# Patient Record
Sex: Male | Born: 1960 | Race: White | Hispanic: No | Marital: Married | Smoking: Never smoker
Health system: Southern US, Community
[De-identification: ages and names within clinical notes are randomized; demographics above are authoritative.]

## PROBLEM LIST (undated history)

## (undated) DIAGNOSIS — F259 Schizoaffective disorder, unspecified: Secondary | ICD-10-CM

## (undated) DIAGNOSIS — I10 Essential (primary) hypertension: Secondary | ICD-10-CM

## (undated) DIAGNOSIS — R5383 Other fatigue: Secondary | ICD-10-CM

## (undated) DIAGNOSIS — E119 Type 2 diabetes mellitus without complications: Secondary | ICD-10-CM

## (undated) DIAGNOSIS — K921 Melena: Secondary | ICD-10-CM

## (undated) DIAGNOSIS — M1990 Unspecified osteoarthritis, unspecified site: Secondary | ICD-10-CM

## (undated) DIAGNOSIS — Z87442 Personal history of urinary calculi: Secondary | ICD-10-CM

## (undated) DIAGNOSIS — Z9289 Personal history of other medical treatment: Secondary | ICD-10-CM

## (undated) DIAGNOSIS — R0600 Dyspnea, unspecified: Secondary | ICD-10-CM

## (undated) DIAGNOSIS — E785 Hyperlipidemia, unspecified: Secondary | ICD-10-CM

## (undated) DIAGNOSIS — N200 Calculus of kidney: Secondary | ICD-10-CM

## (undated) DIAGNOSIS — E559 Vitamin D deficiency, unspecified: Secondary | ICD-10-CM

## (undated) DIAGNOSIS — Z720 Tobacco use: Secondary | ICD-10-CM

## (undated) DIAGNOSIS — A048 Other specified bacterial intestinal infections: Secondary | ICD-10-CM

## (undated) HISTORY — DX: Melena: K92.1

## (undated) HISTORY — DX: Vitamin D deficiency, unspecified: E55.9

## (undated) HISTORY — DX: Tobacco use: Z72.0

## (undated) HISTORY — DX: Hyperlipidemia, unspecified: E78.5

## (undated) HISTORY — PX: APPENDECTOMY: SHX54

## (undated) HISTORY — DX: Personal history of other medical treatment: Z92.89

## (undated) HISTORY — DX: Other fatigue: R53.83

## (undated) HISTORY — DX: Essential (primary) hypertension: I10

## (undated) HISTORY — DX: Other specified bacterial intestinal infections: A04.8

## (undated) HISTORY — DX: Schizoaffective disorder, unspecified: F25.9

## (undated) HISTORY — PX: HERNIA REPAIR: SHX51

## (undated) HISTORY — PX: KNEE SURGERY: SHX244

## (undated) HISTORY — DX: Type 2 diabetes mellitus without complications: E11.9

---

## 1998-03-24 ENCOUNTER — Emergency Department (HOSPITAL_COMMUNITY): Admission: EM | Admit: 1998-03-24 | Discharge: 1998-03-24 | Payer: Self-pay | Admitting: Emergency Medicine

## 1998-04-24 ENCOUNTER — Emergency Department (HOSPITAL_COMMUNITY): Admission: EM | Admit: 1998-04-24 | Discharge: 1998-04-24 | Payer: Self-pay | Admitting: Emergency Medicine

## 1999-06-12 ENCOUNTER — Encounter: Payer: Self-pay | Admitting: Emergency Medicine

## 1999-06-12 ENCOUNTER — Emergency Department (HOSPITAL_COMMUNITY): Admission: EM | Admit: 1999-06-12 | Discharge: 1999-06-12 | Payer: Self-pay | Admitting: Emergency Medicine

## 1999-06-12 ENCOUNTER — Inpatient Hospital Stay (HOSPITAL_COMMUNITY): Admission: AD | Admit: 1999-06-12 | Discharge: 1999-06-15 | Payer: Self-pay | Admitting: Psychiatry

## 2000-11-25 ENCOUNTER — Inpatient Hospital Stay (HOSPITAL_COMMUNITY): Admission: RE | Admit: 2000-11-25 | Discharge: 2000-12-01 | Payer: Self-pay | Admitting: *Deleted

## 2001-01-04 ENCOUNTER — Inpatient Hospital Stay (HOSPITAL_COMMUNITY): Admission: EM | Admit: 2001-01-04 | Discharge: 2001-01-09 | Payer: Self-pay | Admitting: *Deleted

## 2002-12-06 ENCOUNTER — Inpatient Hospital Stay (HOSPITAL_COMMUNITY): Admission: EM | Admit: 2002-12-06 | Discharge: 2002-12-10 | Payer: Self-pay | Admitting: Psychiatry

## 2002-12-16 ENCOUNTER — Encounter: Payer: Self-pay | Admitting: Emergency Medicine

## 2002-12-16 ENCOUNTER — Emergency Department (HOSPITAL_COMMUNITY): Admission: EM | Admit: 2002-12-16 | Discharge: 2002-12-16 | Payer: Self-pay | Admitting: Emergency Medicine

## 2003-02-28 ENCOUNTER — Inpatient Hospital Stay (HOSPITAL_COMMUNITY): Admission: AD | Admit: 2003-02-28 | Discharge: 2003-03-04 | Payer: Self-pay | Admitting: Psychiatry

## 2003-03-19 ENCOUNTER — Encounter: Payer: Self-pay | Admitting: Emergency Medicine

## 2003-03-19 ENCOUNTER — Inpatient Hospital Stay (HOSPITAL_COMMUNITY): Admission: EM | Admit: 2003-03-19 | Discharge: 2003-03-22 | Payer: Self-pay

## 2003-03-23 ENCOUNTER — Other Ambulatory Visit (HOSPITAL_COMMUNITY): Admission: RE | Admit: 2003-03-23 | Discharge: 2003-04-07 | Payer: Self-pay | Admitting: Psychiatry

## 2003-03-25 ENCOUNTER — Encounter: Admission: RE | Admit: 2003-03-25 | Discharge: 2003-03-25 | Payer: Self-pay | Admitting: Family Medicine

## 2003-08-26 ENCOUNTER — Emergency Department (HOSPITAL_COMMUNITY): Admission: EM | Admit: 2003-08-26 | Discharge: 2003-08-26 | Payer: Self-pay | Admitting: Emergency Medicine

## 2003-09-08 ENCOUNTER — Emergency Department (HOSPITAL_COMMUNITY): Admission: EM | Admit: 2003-09-08 | Discharge: 2003-09-08 | Payer: Self-pay | Admitting: Emergency Medicine

## 2003-11-24 ENCOUNTER — Emergency Department (HOSPITAL_COMMUNITY): Admission: EM | Admit: 2003-11-24 | Discharge: 2003-11-24 | Payer: Self-pay | Admitting: Emergency Medicine

## 2005-04-23 ENCOUNTER — Emergency Department (HOSPITAL_COMMUNITY): Admission: EM | Admit: 2005-04-23 | Discharge: 2005-04-23 | Payer: Self-pay | Admitting: Emergency Medicine

## 2005-04-27 ENCOUNTER — Emergency Department (HOSPITAL_COMMUNITY): Admission: EM | Admit: 2005-04-27 | Discharge: 2005-04-27 | Payer: Self-pay | Admitting: Family Medicine

## 2005-09-01 ENCOUNTER — Emergency Department (HOSPITAL_COMMUNITY): Admission: EM | Admit: 2005-09-01 | Discharge: 2005-09-01 | Payer: Self-pay | Admitting: Emergency Medicine

## 2006-09-15 DIAGNOSIS — A048 Other specified bacterial intestinal infections: Secondary | ICD-10-CM

## 2006-09-15 HISTORY — DX: Other specified bacterial intestinal infections: A04.8

## 2007-08-30 DIAGNOSIS — K921 Melena: Secondary | ICD-10-CM

## 2007-08-30 HISTORY — DX: Melena: K92.1

## 2007-09-28 ENCOUNTER — Ambulatory Visit: Payer: Self-pay | Admitting: Gastroenterology

## 2007-09-28 DIAGNOSIS — R195 Other fecal abnormalities: Secondary | ICD-10-CM | POA: Insufficient documentation

## 2007-09-28 DIAGNOSIS — Z9889 Other specified postprocedural states: Secondary | ICD-10-CM | POA: Insufficient documentation

## 2007-09-28 DIAGNOSIS — I87309 Chronic venous hypertension (idiopathic) without complications of unspecified lower extremity: Secondary | ICD-10-CM | POA: Insufficient documentation

## 2007-09-28 DIAGNOSIS — K219 Gastro-esophageal reflux disease without esophagitis: Secondary | ICD-10-CM | POA: Insufficient documentation

## 2007-09-28 DIAGNOSIS — R1310 Dysphagia, unspecified: Secondary | ICD-10-CM | POA: Insufficient documentation

## 2007-10-15 ENCOUNTER — Ambulatory Visit: Payer: Self-pay | Admitting: Gastroenterology

## 2008-01-29 ENCOUNTER — Encounter: Payer: Self-pay | Admitting: Gastroenterology

## 2008-01-29 ENCOUNTER — Ambulatory Visit (HOSPITAL_COMMUNITY): Admission: RE | Admit: 2008-01-29 | Discharge: 2008-01-29 | Payer: Self-pay | Admitting: Gastroenterology

## 2008-01-29 ENCOUNTER — Telehealth: Payer: Self-pay | Admitting: Gastroenterology

## 2008-02-03 ENCOUNTER — Ambulatory Visit: Payer: Self-pay | Admitting: Gastroenterology

## 2008-03-04 ENCOUNTER — Telehealth: Payer: Self-pay | Admitting: Gastroenterology

## 2008-03-08 ENCOUNTER — Ambulatory Visit: Payer: Self-pay | Admitting: Gastroenterology

## 2008-04-11 ENCOUNTER — Telehealth: Payer: Self-pay | Admitting: Gastroenterology

## 2008-04-13 ENCOUNTER — Ambulatory Visit: Payer: Self-pay | Admitting: Gastroenterology

## 2008-04-13 LAB — CONVERTED CEMR LAB
Basophils Absolute: 0 10*3/uL (ref 0.0–0.1)
Basophils Relative: 0.1 % (ref 0.0–3.0)
Eosinophils Absolute: 0.5 10*3/uL (ref 0.0–0.7)
Eosinophils Relative: 3.9 % (ref 0.0–5.0)
HCT: 47.8 % (ref 39.0–52.0)
Hemoglobin: 16.5 g/dL (ref 13.0–17.0)
Lymphocytes Relative: 27.5 % (ref 12.0–46.0)
MCHC: 34.6 g/dL (ref 30.0–36.0)
MCV: 86.2 fL (ref 78.0–100.0)
Monocytes Absolute: 0.8 10*3/uL (ref 0.1–1.0)
Monocytes Relative: 6.7 % (ref 3.0–12.0)
Neutro Abs: 7.5 10*3/uL (ref 1.4–7.7)
Neutrophils Relative %: 61.8 % (ref 43.0–77.0)
Platelets: 234 10*3/uL (ref 150–400)
RBC: 5.54 M/uL (ref 4.22–5.81)
RDW: 12 % (ref 11.5–14.6)
WBC: 12.2 10*3/uL — ABNORMAL HIGH (ref 4.5–10.5)

## 2009-03-28 ENCOUNTER — Encounter: Admission: RE | Admit: 2009-03-28 | Discharge: 2009-03-28 | Payer: Self-pay | Admitting: Family Medicine

## 2009-04-03 DIAGNOSIS — Z9289 Personal history of other medical treatment: Secondary | ICD-10-CM

## 2009-04-03 HISTORY — DX: Personal history of other medical treatment: Z92.89

## 2009-04-03 HISTORY — PX: TRANSTHORACIC ECHOCARDIOGRAM: SHX275

## 2009-06-13 ENCOUNTER — Emergency Department (HOSPITAL_COMMUNITY): Admission: EM | Admit: 2009-06-13 | Discharge: 2009-06-13 | Payer: Self-pay | Admitting: Emergency Medicine

## 2009-08-26 DIAGNOSIS — IMO0001 Reserved for inherently not codable concepts without codable children: Secondary | ICD-10-CM

## 2009-08-26 HISTORY — DX: Reserved for inherently not codable concepts without codable children: IMO0001

## 2009-11-29 ENCOUNTER — Encounter: Admission: RE | Admit: 2009-11-29 | Discharge: 2009-11-29 | Payer: Self-pay | Admitting: Internal Medicine

## 2011-01-08 NOTE — Assessment & Plan Note (Signed)
[facility]                         GASTROENTEROLOGY OFFICE NOTE   Travis [name], GILLOOLY                     MRN:          [id number]  DATE:09/28/2007                            DOB:          [birth year]    PROBLEM:  1. Heme-positive stool.  2. Dysphagia.   REASON:  Travis [name] is a pleasant 49 year old white male referred  through  the courtesy of Dr. Perrin Maltese for evaluation.  He has complained of  some left lower quadrant abdominal discomfort.  He claims to have had  intermittent black stools.  He was seen at urgent care where heme-  positive stool was noted.  He takes occasional Goody's Powder or Advil.  He does complain of pyrosis, which is controlled with over-the-counter  Prilosec.  He also has dysphagia to solids.   PAST MEDICAL HISTORY:  Pertinent for hypertension.  He is status post  herniorrhaphy and appendectomy.   FAMILY HISTORY:  Pertinent for both parents with diabetes.   MEDICATIONS:  Include over-the-counter Prilosec and Lotensin.   ALLERGIES:  He has no allergies.   He does not smoke, though he chews tobacco.  He drinks socially.  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   REVIEW OF SYSTEMS:  Pertinent for back pain, sleeping problems with  fatigue.   PHYSICAL EXAMINATION:  Pulse 100.  Blood pressure 130/80.  Weight 192.  HEENT: EOMI.  PERRLA.  Sclerae are anicteric.  Conjunctivae are pink.  NECK:  Supple without thyromegaly, adenopathy or carotid bruits.  CHEST:  Clear to auscultation and percussion without adventitious  sounds.  CARDIAC:  Regular rhythm; normal S1 S2.  There are no murmurs, gallops  or rubs.  ABDOMEN:  There is minimal left lower quadrant tenderness to palpation  without guarding or rebound.  Bowel sounds are normoactive.  Abdomen is  soft, nondistended.  There are no abdominal masses, splenic enlargement  or hepatomegaly.  EXTREMITIES:  Full range of motion.  No cyanosis, clubbing or edema.  RECTAL:   Deferred.   IMPRESSION:  1. Heme-positive stool.  This colonic bleeding source, including      polyps, AVMs,  neoplasm and hemorrhoids, are possibilities.  2. Left lower quadrant pain.  This is quite nonspecific.  3. Gastroesophageal reflux disease.  4. Dysphagia - rule out esophageal stricture.   RECOMMENDATIONS:  1. Colonoscopy.  2. Upper endoscopy with dilatation as indicated.  3. A followup Hemoccult if occult GI bleeding is identified.     Travis Hair. Arlyce Dice, MD,FACG  Electronically Signed    RDK/MedQ  DD: 09/28/2007  DT: 09/28/2007  Job #: [id number]   cc:   Travis [name], M.D.

## 2011-01-08 NOTE — Letter (Signed)
September 28, 2007    [name]  __________   RE:  [name], Travis  MRN:  [id number]  /  DOB:  01/13/1961   Dear [name]:   It is my pleasure to have treated you recently as a new patient in my  office.  I appreciate your confidence and the opportunity to participate  in your care.   Since I do have a busy inpatient endoscopy schedule and office schedule,  my office hours vary weekly.  I am, however, available for emergency  calls every day through my office.  If I cannot promptly meet an urgent  office appointment, another one of our gastroenterologists will be able  to assist you.   My well-trained staff are prepared to help you at all times.  For  emergencies after office hours, a physician from our gastroenterology  section is always available through my 24-hour answering service.   While you are under my care, I encourage discussion of your questions  and concerns, and I will be happy to return your calls as soon as I am  available.   Once again, I welcome you as a new patient and I look forward to a happy  and healthy relationship.    Sincerely,     Barbette Hair. Arlyce Dice, MD,FACG  Electronically Signed   RDK/MedQ  DD: 09/28/2007  DT: 09/28/2007  Job #: [id number]

## 2011-01-08 NOTE — Letter (Signed)
September 28, 2007    Jonita Albee, M.D.  Urgent [facility]  [address]  [place], [place] [address]   RE:  Travis [name], Travis [name]  MRN:  [id number]  /  DOB:  October 29, 1960   Dear Dr. Perrin Maltese:   Upon your kind referral, I had the pleasure of evaluating your patient  and I am pleased to offer my findings.  I saw [name]  in the  office today.  Enclosed is a copy of my progress note that details my  findings and recommendations.   Thank you for the opportunity to participate in your patient's care.    Sincerely,      Barbette Hair. Arlyce Dice, MD,FACG  Electronically Signed    RDK/MedQ  DD: 09/28/2007  DT: 09/28/2007  Job #: [id number]

## 2011-01-11 NOTE — H&P (Signed)
[facility]  Patient:    Travis [name], Travis [name]                     MRN: [id number] Adm. Date:  [id number] Disc. Date: [id number] Attending:  Jasmine Pang                   Psychiatric Admission Assessment  DATE OF ADMISSION:  November 25, 2000  IDENTIFICATION:  A 50 year old, divorced, Caucasian male, from [place], [place].  HISTORY OF PRESENT ILLNESS:  The patient presented to the hospital in a very depressed state with suicidal ideation.  He has a history of mood instability and had been diagnosed by Dr. Dub Mikes as bipolar disorder during his previous admission to our unit one year ago.  He is currently quite depressed and has multiple neurovegetative symptoms including insomnia, anhedonia, anergia, difficulty concentrating, feelings of hopelessness and worthlessness.  He is also experiencing suicidal ideation which was worsening.  He had been on psychiatric medications in the past (Risperdal after last admission here), but states he has been off for the past several months.  He has had some thought blocking and evidence of psychosis.  He states that he sees faces laughing at him much of the time.  He also states he feels that he is "no good."  PAST PSYCHIATRIC HISTORY:  The patient was hospitalized on our unit one year ago.  He was treated by Dr. Dub Mikes.  He was treated with Risperdal and diagnosed as bipolar disorder.  Since that admission, he has also been at [facility].  He as on an antipsychotic but cannot remember the name of it.  He states he discontinued his psychiatric medications after he ran out of a free supply that had been given to him.  He did not return to a doctor for followup and more medication.  Since then, his psychosis and mood have worsened.  PAST MEDICAL HISTORY:  The patient has no medical problems.  ALLERGIES:  No known drug allergies.  MEDICATIONS:  He is on no medication.  FAMILY PSYCHIATRIC HISTORY:  Biologic  father drank alcohol daily.  Mother has a history of suicide attempt.  He states he drinks some alcohol weekly.  The amount is unclear.  One report says four to five times a week but he states it it is not this often.  SOCIAL HISTORY:  The patient currently lives with two friends in [place]. He is divorced x 3 years.  He has three children who live in [place]. He does not have any legal problems.  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  ADMISSION MENTAL STATUS EXAMINATION:  The patient presented as a reserved but cooperative, disheveled, Caucasian male with poor eye contact.  There was psychomotor retardation.  Speech was soft and slow, nonspontaneous.  Mood was depressed, anxious.  Affect constricted.  There was positive psychosis as per history of present illness.  There was positive suicidal ideation.  He contracted for safety while in the hospital.  There was no homicidal ideation. Thought processes were slowed.  There was some mild thought blocking.  Thought content revolved predominately around his anxiety as to whether he would ever be released from the hospital ("are you going to lock me up forever").  On cognitive examination the patient was awake and oriented x 4.  Short- and long-term memory revealed some deficiencies on this examination.  General fund  of knowledge were age and education level appropriate.  Attention and concentration were poor.  Insight fair.  Judgment fair.  ADMISSION DIAGNOSES: Axis I:    Bipolar disorder, depressed phase, severe with psychosis. Axis II:   Deferred. Axis III:  Healthy. Axis IV:   Severe. Axis V:    Current global assessment of functioning 20, highest past year            52.  STRENGTHS AND ASSETS:  The patient has a health-seeking attitude.  He has supportive friends with whom he lives.  PROBLEMS:  Mood instability with hallucinations and delusional ideation as well as  suicidal ideation.  SHORT-TERM TREATMENT GOAL:  Improve reality testing, resolution of suicidal ideation.  LONG-TERM TREATMENT GOAL:  Stabilization of psychotic process to the point that he is able to work and live without significant difficulties.  Resolution of mood instability.  PLAN:  Will begin Geodon 20 mg p.o. b.i.d. x 1 day, then 40 mg p.o. b.i.d. Will also begin Celexa 20 mg q.a.m.  Will discontinued trazodone due to sedation from this (he was also sedate from some Haldol he had received upon admission due to his psychotic processes).  The patient will be involved in unit therapeutic groups and activities.  Our case manager will contact his roommates to discuss disposition issues.  ANTICIPATED LENGTH OF STAY:  Four to five days.  CONDITION NECESSARY FOR DISCHARGE:  Less depressed, not suicidal, not psychotic.  POST-HOSPITAL CARE PLANS:  Return to live with his roommates in [place]. He wants to return to his current job.  FOLLOWUP THERAPY AND MEDICATION MANAGEMENT:  Will be arranged with a Tarris Delbene on his insurance panel. DD:  11/26/00 TD:  11/26/00 Job: [id number]

## 2011-01-11 NOTE — Discharge Summary (Signed)
[facility]  Patient:    Travis [name], Travis [name]                     MRN: [id number] Adm. Date:  [id number] Disc. Date: [id number] Attending:  Jasmine Pang CC:         [facility] IOP program   Discharge Summary  REASON FOR ADMISSION:  The patient was a 50 year old divorced Caucasian male from [place], [place].  He presented to the hospital in a very depressed state with suicidal ideation.  He has a history of mood instability and has been diagnosed by Madie Reno A. Dub Mikes, M.D., as suffering from bipolar disorder during his previous admission to our unit one year ago.  He was not felt to be safe to be managed at this point on outpatient basis.  The patient has discontinued his psychiatric medications after running out of the free supply he had been given.  For further psychiatric admission information, see psychiatric admission assessment.  PHYSICAL EXAMINATION:  The physical examination was done by [facility] D. Warner Mccreedy.  The patient was status post bilateral knee cartilage repair, also status post appendectomy and right inguinal hernia repair, also status post vasectomy.  ADMISSION LABORATORIES:  The urine drug screen was negative.  Urinalysis was within normal limits.  CBC with diff. was grossly within normal limits except for a slightly elevated wbc count of 11.3 (4 to 10.5).  TSH and free T4 were within normal limits.  RPR nonreactive.  HOSPITAL COURSE:  Upon admission, the patient was psychotic.  He was begun on Haldol 5 mg now, then Haldol 5 mg p.o. b.i.d.  He was also begun on Cogentin 1 mg now, then 1 mg p.o. b.i.d.  In addition, he was given a p.[name].n. dose of Haldol 5 mg p.o. q.4h. p.[name].n. audiovisual hallucinations and Cogentin 1 mg p.o. q.4h. p.[name].n. EPS.  He did not require p.[name].n.s.  On November 26, 2000, the patient was begun on Geodon 20 mg p.o. b.i.d.  On Friday, November 28, 2000, Geodon was increased to 40 mg p.o. b.i.d.   He will be discharged on this dose and given a months free supply of Geodon. Afterwards, a prescription was written for an increase to 60 mg p.o. b.i.d.  The patient had also been started on trazodone 50 mg p.o. q.h.s.  This made him too drowsy and the medication was discontinued on November 26, 2000.  Also on November 26, 2000, the patient was begun on Celexa 20 mg p.o. q.a.m. due to depressive symptoms.  The patient tolerated these medications well with no significant side effects. Initially he was sedate but this resolved.  He was able to participate appropriately in unit therapeutic groups and activities.  The roommates he lives with were quite invested in him and wanted him to return to their home (he has lived there for three years).  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 He was concerned about not losing money so he could make his child support payments. His mental status improved markedly during the hospitalization.  DISCHARGE MENTAL STATUS EXAMINATION:  The patient had better eye contact. There was some psychomotor retardation but this had improved.  Speech was still soft and slow but more spontaneous.  Mood was less depressed and anxious.  Affect able to reveal wide range.  He smiled frequently during conversations.  There was no psychosis.  He  was no longer experiencing hallucinations.  There was no suicidal or homicidal ideation.  Thought processes were logical and goal directed.  The mild thought blocking had resolved.  His cognitive function was back to baseline.  Insight good. Judgement good.  DISCHARGE DIAGNOSES: AXIS I.   Schizoaffective disorder, depressed state. AXIS II.  None. AXIS III. Healthy. AXIS IV.  Moderate. AXIS V.   Current Global Assessment of Functioning is 55, highest in past           year 65, admission Global Assessment of Functioning was 20.  DISCHARGE MEDICATIONS: 1. Geodon 40 mg p.o. b.i.d.  x 1 month then 60 mg p.o. b.i.d. 2. Celexa 20 mg q.a.m.  ACTIVITY LEVEL:  No restrictions.  DIET:  No restrictions.  POST HOSPITAL CARE PLANS:  The patient will be followed at the intensive outpatient program by Dr. Ladona Ridgel (since he cross-covered for me during my absence and knows the patient).  He was given a free supply of Geodon and Celexa for one month.  He was then given prescriptions for continued treatment. DD:  12/01/00 TD:  12/01/00 Job: [id number]

## 2011-01-11 NOTE — H&P (Signed)
NAME:  Travis [name], Travis [name]                        ACCOUNT NO.:  [id number]   MEDICAL RECORD NO.:  [id number]                   PATIENT TYPE:  IPS   LOCATION:  0406                                 [facility]ACILITY:  [facility]   PHYSICIAN:  Jeanice Lim, M.D.              DATE OF BIRTH:  14-Feb-1961   DATE OF ADMISSION:  12/06/2002  DATE OF DISCHARGE:                         PSYCHIATRIC ADMISSION ASSESSMENT   DATE OF ASSESSMENT:  December 07, 2002   PATIENT IDENTIFICATION:  This is a 50 year old white separated male who is  voluntary.   HISTORY OF PRESENT ILLNESS:  This patient presented to mental health  complaining of approximately one week of gradually increase in auditory  hallucinations and stating, My thinking is wrong.  He believes that  someone or something is going to harm him.  He is fearful and has begun  seeing some faces of people that he knows sort of hanging in the air in  front of him.  This is a symptom he has had previously.  He is aware that  this is not normal but it, nevertheless, causes him to be fearful and is  upsetting to him.  He has been anhedonic and anxious for the past week.  He  endorses some vague suicidal ideation that is driven out of his fears.  He  has been sleeping poorly and having trouble concentrating or accomplishing  his ADLs.  His hygiene has deteriorated.  He cannot cite any stressor or  think of anything that has changed in his life that may have triggered these  symptoms.  He stopped his medications over a year ago and had not had  symptoms until recently.   PAST PSYCHIATRIC HISTORY:  This is approximately the patient's third  admission to [facility].  He has also been admitted  to [facility] in May 2002 and April 2002.  He has a previous history of suicide  attempt by overdose and has also been previously hospitalized at [facility] and [facility].   SUBSTANCE ABUSE HISTORY:  The patient  denies any previous substance abuse.   PAST MEDICAL HISTORY:  The patient has no regular primary care physician.  Medical problems include occasional stomach ache but he has had none of this  recently.  He also reports some occasional low back ache and neck ache,  which he attributes to lifting boxes at work.  Past medical history is  remarkable for a hernia repair and a previous appendectomy.   MEDICATIONS:  None within the past year.  Previously, the patient was  stabilized on Geodon 20 mg in the morning, 20 mg in the afternoon, and 60 mg  at h.s., and Celexa 20 mg.   REVIEW OF SYSTEMS:  Review of systems is remarkable for some neck pain,  which the patient rates at 2/10 and some lower back pain with movement which  is worse later  in the day, which he describes more as a backache with  fatigue over his lumbar area beneath the belt of his jeans.  The patient  denies that he is sexually active.  He denies any substance abuse.   PHYSICAL EXAMINATION:  GENERAL:  This is a disheveled, dirty, short stature  Caucasian male who is unshaven but is generally fully alert and cooperative.  He does have body odor.  VITAL SIGNS:  Temperature 98.3, pulse 88, respirations 20, blood pressure  125/90.  He is 5 feet 5 inches tall and we do not have a current weight on  him.  He appears to be approximately 160 or 170 pounds.  SKIN:  Skin is medium tone.  HEENT:  Hair is silvery gray.  Head is normocephalic and atraumatic.  Hygiene: Poor.  Hearing intact to normal voice.  EENT: PERRLA.  Sclerae are  nonicteric.  No rhinorrhea.  Oropharynx is noninjected.  NECK:  Supple, no thyromegaly.  CARDIOVASCULAR:  S1 and S2 heard, no clicks, murmurs, or gallops.  LUNGS:  Clear.  ABDOMEN:  Flat, soft with normal bowel sounds, no masses appreciated, no  evidence of tenderness.  GENITALIA:  Deferred.  MUSCULOSKELETAL:  Posture is upright.  Spine is straight.  Strength is 5/5.  NEUROLOGIC:  Cranial nerves II-XII  are intact.  EOM are intact.  Motor  movements are smooth, no tremor, no pill rolling, no cogwheeling, no signs  of EPS.  Romberg: Without findings.  Deep tendon reflexes are 1+/5 and  sluggish.  No focal findings.   LABORATORY DATA:  @@   SOCIAL HISTORY:  The patient has a twelfth grade education.  He has been  separated for several years from his wife and has two children ages 32 and 48.  He was previously married one time for approximately eight years.  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  He has been unable to go to  work for the past week to his regular job because his symptoms have been  distracting him and causing him to feel fearful.   FAMILY HISTORY:  Family history is remarkable for a mother and a father with  a history of alcohol abuse.   MENTAL STATUS EXAM:  The patient has anxious and blunted affect.  He is  cooperative and directable, no signs of psychomotor slowing.  He, however,  seems to be rather anxious.  Speech is without pressure and generally of  normal pace.  He seems a bit anxious and his speech seems to be a bit  monotone in quality.  He offers very little but is readily responsive to  questions, usually just answering yes or no or as little as possible.  His  mood seems depressed and mildly anxious and he is a bit guarded.  He seems  to be a bit fearful.  Thought process is remarkable for paranoia.  He is  having some vague auditory hallucinations, hearing some voices, which are  mumbling and chanting and rather indistinguishable.  He does have some mild  thought agitation at times and does appear to be slightly internally  distracted.  No evidence of overt suicidal ideation or homicidal ideation  today.  Cognitive:. Intact and oriented x 3.  ADMISSION DIAGNOSES:   AXIS I:  1. Psychosis, not  otherwise specified.  2. Rule out  schizoaffective disorder, depressed.   AXIS II:  Deferred.   AXIS III:  1. Chronic low grade neck pain.  2. Chronic back pain, mild.   AXIS IV:  Deferred.   AXIS V:  Current 22, past year 3.   INITIAL PLAN OF CARE:  Plan is to voluntarily admit the patient to relieve  his suicidal ideation and his auditory and visual hallucinations.  We will  check his UA and UDS and a thyroid panel on him.  We will get a baseline EKG  given the fact that he has been on Geodon for a while, he is middle-aged,  and he does not have a regular primary care Florice Hindle.  So, for screening  purposes, we will do that.  We will start him on Celexa 10 mg p.o. daily and  start him on Geodon 20 mg p.o. b.i.d. with his first dose now and then his  next dose at h.s.   ESTIMATED LENGTH OF STAY:  Five days.     Margaret A. Stephannie Peters                   Jeanice Lim, M.D.    MAS/MEDQ  D:  12/10/2002  T:  12/10/2002  Job:  [id number]

## 2011-01-11 NOTE — H&P (Signed)
[facility]  Patient:    Travis [name], Travis [name]                     MRN: [id number] Adm. Date:  [id number] Attending:  Denny Peon Dictator:   Young Berry Lorin Picket, N.P.                   Psychiatric Admission Assessment  DATE OF ADMISSION:  Jan 04, 2001.  IDENTIFYING INFORMATION:  This is a 50 year old divorced Caucasian male who is a voluntary admission for suicidal ideation with a plan to shoot himself, and generalized depression.  REASON FOR ADMISSION AND SYMPTOMS:  Patient with past diagnosis of bipolar disorder and schizoaffective disorder reports feeling depressed and starting to see faces again about a week ago.  States that he has had thoughts of shooting himself for the past 2-3 days.  He reports he was brought to [facility] by his housemate because he was tired and could not sleep, and patient admits that he was also having feelings of paranoia, fearing that something bad would happen, and he also states "if I eat food I know I will be harming my family."  He is also afraid of things that are happening at work, but he is not able to be specific about his feelings, and also is concerned that someone is going to take his fathers house away from his father, although he is unable to be any more clear than that.  He admits to decreased sleep, sleeping hardly at all for the past few days, and greatly decreased appetite, with no specified weight loss.  Patient keeps repeatedly seeing faces, talking faces, and he knows that this is not normal, but this is very upsetting to him.  Today, he admits to some homicidal ideation but no plan or intent today.  He denies homicidal ideation.  He denies actually having any auditory or visual hallucinations today.  PAST PSYCHIATRIC HISTORY:  Patient was seen by Dr. Ladona Ridgel on an outpatient basis, last after discharge from [facility] at the beginning of April.  After seeing Dr. Ladona Ridgel in outpatient, a  follow up appointment was made with him with some male physician in the community apparently.  Patient does not remember her name and he was not able to keep the appointment.  Patient reports at least one prior admission this year here at [facility], also prior admissions at [facility] and [facility].  Patient does have a history of one previous suicide attempt by overdose.  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  Currently he lives with 2 friends from work named Lobbyist and Mantoloking who he states are supportive.  He has been married once for 8 years.  Currently he is separated.  He has 2 children, ages 70 and 8 years.  FAMILY HISTORY:  Positive for a mother and father with ETOH abuse problems in the past.  ALCOHOL AND DRUG HISTORY:  Patient reports occasional use of alcohol, does use oral tobacco, chewing tobacco, on a regular basis, denies any use of illegal drugs.  PAST MEDICAL HISTORY:  Primary care Kennetta Pavlovic is none.  Patient has no regular medical doctor.  Medical problems are history of appendectomy, hernia repair, bad cartilage in both knees, distant history of sexually transmitted diseases; however, patient denies having any risk at this time, and has some chronic mild  lumbar backache he attributes to his work.  Medications:  Apparently patient was previously on Geodon 60 mg p.o. b.i.d. and Celexa 20 mg daily as best we can tell.  He had stopped taking his medications at some point, seems unclear about this, whether he was actually taking them or not.  DRUG ALLERGIES:  No known drug allergies.  POSITIVE PHYSICAL FINDINGS:  His PE is pending.  His labs are pending.  Temp on admission 97.3, vital signs pulse 84, respirations 18.  Patients weight is 171 pounds.  His blood pressure is 136/85.  He has no somatic  complaints today.  MENTAL STATUS EXAMINATION:  This is a casually dressed white male, quite disheveled looking.  Hygiene is poor.  He has a very flat affect; however he is polite and cooperative.  Speech is slow in pace and soft in tone.  Mood is very depressed and guarded.  Patient has obvious psychomotor retardation. Thought process is moderately disorganized, with obvious thought blocking. Positive suicidal ideation but no plan or intent, and positive for paranoid ideation.  He is negative for homicidal ideation, and no evidence of auditory or visual hallucinations today.  Cognitively, he is oriented to person, place and day but not the date.  ADMISSION DIAGNOSES: Axis I:    Rule out bipolar disorder, depressed vs. schizoaffective disorder            and major depression. Axis II:   Deferred. Axis III:  None. Axis IV:   Moderate problems with his primary support group related to his            marital status and his separation from  his wife. Axis V:    Current 35, past year 22.  INITIAL PLAN OF CARE:  Plan is to admit the patient to stabilize his mood. Q.15 minute checks in place for safety.  We will force Gatorade at this point for electrolytes since patients appetite has decreased.  We will order his previous old record to the floor, and for his depression we will start him on Celexa 20 mg p.o. daily, and we will start his Celexa today.  For his paranoia and psychotic symptoms, we will start him on Zyprexa 2.5 mg t.i.d. with the first dose now, and Geodon 20 mg p.o. b.i.d., and we will order routine labs on him.  TENTATIVE LENGTH OF STAY:  Three to five days. DD:  01/05/01 TD:  01/05/01 Job: [id number]

## 2011-01-11 NOTE — Discharge Summary (Signed)
NAME:  Travis [name], Travis [name]                        ACCOUNT NO.:  [id number]   MEDICAL RECORD NO.:  [id number]                   PATIENT TYPE:  IPS   LOCATION:  0406                                 [facility]ACILITY:  [facility]   PHYSICIAN:  Jeanice Lim, M.D.              DATE OF BIRTH:  [birth year]   DATE OF ADMISSION:  12/06/2002  DATE OF DISCHARGE:  12/10/2002                                 DISCHARGE SUMMARY   IDENTIFYING DATA:  This is a 50 year old Caucasian separated male  voluntarily admitted, complaining of approximately one week of increasing  auditory hallucinations, stating, my thinking is wrong, fearing that  someone may hurt him, fearful, seeing faces of people he knew, reporting  vague suicidal thoughts and difficulty sleeping and difficulty with ADLs.   MEDICATIONS:  1. Geodon 20 mg t.i.d. and 60 mg q.h.s.  2. Seroquel 20 mg q.a.m.   PHYSICAL EXAMINATION:  Physical exam essentially within normal limits,  neurologically nonfocal.   ROUTINE ADMISSION LABORATORY DATA:  EKG within normal limits.   CBC within normal limits.  CMET within normal limits.  TSH 2.9.   MENTAL STATUS EXAMINATION:  Anxious to the point of death, hypercooperative,  with no signs of psychomotor slowing, appearing anxious.  Speech was without  pressure but monotone, short answers, appearing depressed, anxious and  fearful with vague auditory hallucinations, mumbling and paranoid ideation.  No acute suicidal or homicidal ideation, cognitively intact, judgment and  insight fair.   ADMITTING DIAGNOSES:   AXIS I:  Schizoaffective disorder, depressed type.   AXIS II:  None.   AXIS III:  Chronic low-grade neck pain and back pain.   AXIS IV:  Moderate, limited support system.   AXIS V:  22/60.   HOSPITAL COURSE:  The patient was admitted, ordered routine p.[name].n.  medications, underwent further monitor and was cooperative and participated  in individual, group and milieu therapy.  The patient was optimized  on  Geodon and Celexa, participated in aftercare planning and reported positive  response to medications without side-effects.   CONDITION AT DISCHARGE:  Improved.  Mood was more euthymic, less depressed,  affect bright and thought process goal-directed, thought content negative  for dangerous ideation or psychotic symptoms.  The patient denied having  voices or hallucinations and had no suicidal or homicidal ideation.   DISCHARGE MEDICATIONS:  The patient was discharged on:  1. Geodon 60 mg b.i.d. to take with food.  2. Klonopin 0.25 mg q.a.m. and q.h.s.  3. Celexa 20 mg q.a.m.  4. Ambien 10 mg q.h.s. p.[name].n.   FOLLOWUP:  The patient will follow up with Dr. Jeanice Lim within one  week.   DISCHARGE DIAGNOSES:   AXIS I:  Schizoaffective disorder, depressed type.   AXIS II:  None.   AXIS III:  Chronic low-grade neck pain and back pain.   AXIS IV:  Moderate, limited support system.   AXIS V:  Global  assessment of functioning was 50 to 55 on discharge.                                               Jeanice Lim, M.D.    JEM/MEDQ  D:  12/22/2002  T:  12/23/2002  Job:  [id number]

## 2011-01-11 NOTE — H&P (Signed)
NAME:  Travis [name], Travis [name]                        ACCOUNT NO.:  [id number]   MEDICAL RECORD NO.:  [id number]                   PATIENT TYPE:  IPS   LOCATION:  0507                                 [facility]ILITY[facility]  [facility]   PHYSICIAN:  Geoffery Lyons, M.D.                   DATE OF BIRTH:  [birth year]   DATE OF ADMISSION:  02/28/2003  DATE OF DISCHARGE:                         PSYCHIATRIC ADMISSION ASSESSMENT   IDENTIFYING INFORMATION:  This is a 50 year old white male who is separated.  This is a voluntary admission.   HISTORY OF PRESENT ILLNESS:  This patient with a history of schizoaffective  disorder presented here at the hospital as a walk-in appearing scared and  tearful and stating that he had voices in his head that they don't like  me.  He reports that the voices appeared 2 or 3 days ago and he had been  becoming progressively more afraid, afraid that people do not like him and  feeling generally paranoid, says that the voices are distracting him and  giving him bad ideas, but he denies that he has any desire to overtly hurt  anyone else or himself, but he feels that the bad ideas are that people are  against him.  He has been unable to function at work, lying in bed a lot for  the past 2 or 3 days.  He endorses feelings of depressed mood, anhedonia,  vague paranoia, and having auditory hallucinations, also poor appetite and  auditory hallucinations which are disrupting his sleep.  Apparently there  are no clear commands with the auditory hallucinations, but they are quite  disruptive to him and he is hearing a lot of background noise.  The patient  denies any overt suicidal ideation or homicidal ideation, denies any visual  hallucinations.   PAST PSYCHIATRIC HISTORY:  The patient has no current psychiatrist.  He did  not follow up after his last discharge from [facility] which was in April of 2004.  This is his 4th hospitalization at [facility].  The patient does have a  history of suicide attempt in the distant past.  His  hospitalizations have occurred usually after episodes of medication  noncompliance.   SOCIAL HISTORY:  The patient has a 12th grade education.  He is separated  many years from his previous wife and has 2 children who do not live with  him.  He lives with 3 friends, for the past 4 years who help him, and he  reports they are supportive and get along well.  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  No legal charges.   FAMILY HISTORY:  Remarkable for a mother with a history of substance abuse.   ALCOHOL AND DRUG HISTORY:  The patient denies any alcohol or substance  abuse.  He does use oral tobacco and the amount is unclear.   PAST MEDICAL  HISTORY:  The patient reports that he is followed by a Dr.  Memory Argue whose name is on his card, but apparently he has never seen this  physician and does not seem to know who he is.  Medical problems are chronic  low back pain, which he scores a 2/10.  He denies any other physical  problems.  Past medical history is remarkable for hernia repair,  appendectomy, and a history of intermittently elevated blood pressure.  He  also has a history of being treated for sexually-transmitted diseases in the  past, specifically gonorrhea.   REVIEW OF SYSTEMS:  Remarkable for his denial of any seizures, blackouts or  memory loss.  He has a history of bilateral knee surgery but complains of no  pain of this recently.  He does also have a history of a vasectomy and a  past history of kidney stones.  The patient is unclear on why he stopped  taking his medicines.  He denies any specific side effects from them and  when asked why he stopped them he simply shrugs his shoulders.   MEDICATIONS:  None at this time.  The patient was previously stabilized on  Geodon 60 mg p.o. b.i.d., Klonopin 0.25 mg every morning and q.h.s., Celexa  20 mg daily, and Ambien 10 mg as needed for sleep.    DRUG ALLERGIES:  None.   POSITIVE PHYSICAL FINDINGS:  This is a well-nourished, well-developed,  generally healthy appearing but disheveled male.  He is approximately 5 feet  5-1/2 inches tall, weighs 180 pounds.  Temperature 98, pulse 81,  respirations 20, blood pressure 119/77.  HEAD:  Normocephalic and atraumatic.  EENT:  PERRLA.  Sclerae are  nonicteric, no rhinorrhea.  Oropharynx is marked by some foul breath odor  due to poor hygiene.  Otherwise Oropharynx is unremarkable.  Teeth appear to  have received repair.  NECK:  Supple, no thyromegaly or lymphadenopathy.  CARDIOVASCULAR:  S1 and S2 is heard, no clicks, murmurs or gallops or extra  sounds.  Apical pulse is synchronous with radial pulse.  LUNGS:  Clear to auscultation.  ABDOMEN:  Flat, soft, nontender.  No CVA tenderness is noted.  GENITALIA:  Deferred.  MUSCULOSKELETAL:  Gait normal, no swelling or erythema of any joints.  NEURO:  Cranial nerves II-XII intact.  EOMs intact.  No nystagmus.  Gait is  normal with normal arm swing.  The patient does display some very slight  psychomotor slowing.  Motor movements however are smooth.  Sensory is  grossly intact.  Deep tendon reflexes are 2+/5 and are brisk.  Romberg  without findings.  EXTREMITIES:  Pink, warm, no swelling.  SKIN:  Without remarkable lesions or tattoos.   LABORATORY DATA:  Currently pending.   MENTAL STATUS EXAM:  This is a disheveled, medium build male with normal  motor exam other than very slight psychomotor slowing.  He is disheveled,  with body odor.  His AMES score is noted at 0.  His BURNS was 14 at the time  of admission.  Speech is soft, without pressure.  He gives minimal answers.  Mood is generally guarded and mildly anxious.  Thought process is remarkable  for no overt suicidal or homicidal ideation.  He is still having some mild  auditory hallucinations and appears mildly anxious and a little bit paranoid, but is generally receptive and  cooperative with the exam.  He  appears to have some very mild though disorganization and internal  distraction along with the hallucinations.  Cognitively he is intact and  oriented x3, however he is directable and is still able to track  conversations in spite of his symptoms.  His thought content today is  primarily concerned about resolving his symptoms and feels quite afraid of  his symptoms.  He is also concerned about giving adequate notification to  his work so that he does not lose his job.   ADMISSION DIAGNOSIS:   AXIS I:  Schizoaffective disorder, depressed.   AXIS II:  No diagnosis.   AXIS III:  Chronic low back pain.   AXIS IV:  Moderate problems with chronic medication noncompliance for  reasons which are not clear.   AXIS V:  Current 23, past year 79.   INITIAL PLAN OF CARE:  Voluntarily admit the patient with q.15 minute checks  in place.  Our goal is to control his auditory hallucinations so that the  patient can function more safely back in his own environment and at work.  We are planning on restarting his Geodon 60 mg p.o. b.i.d. with food,  Klonopin 0.25 mg p.o. b.i.d., and Celexa 20 mg p.o. daily.  We will do basic  labs on him including a CBC and metabolic panel and a thyroid panel.  We  will also do a urine for GC and chlamydia and a urine drug screen.  We will  do an RPR on him also and we have provided Tylenol for him if his back pain  bothers him and he have instructed him in this, and we will also provide him  with a nicotine patch, 14 mg daily, since he usually uses oral tobacco.  However, he states he does not think he is going to need it, but we have  instructed him that we have made this available to him if he needs it while  he is here.  We have explained about the medications to him and the  importance of staying on them regularly.  He has asked some pertinent  questions.  He has explained the risks and benefits and he is in agreement.  He has  been interacting satisfactorily in the hallways, although he does  state he skipped breakfast this morning.  He reported that he did get a dose  of medicine last night and so far the voices are decreased this morning over  what they were yesterday and he denies any overt side effects.   ESTIMATED LENGTH OF STAY:  5 days.      Margaret A. Scott, N.P.                   Geoffery Lyons, M.D.    MAS/MEDQ  D:  03/01/2003  T:  03/01/2003  Job:  [id number]

## 2011-01-11 NOTE — Discharge Summary (Signed)
[facility]  Patient:    Travis [name], Travis [name]                     MRN: [id number] Adm. Date:  [id number] Disc. Date: [id number] Attending:  Denny Peon Dictator:   Candi Leash. Orsini, N.P.                           Discharge Summary  IDENTIFYING INFORMATION:  Patient is a 50 year old divorced Caucasian male, who was a voluntary admission for suicidal ideation with a plan to shoot himself and also having some generalized depressive symptoms.  Patient has a past diagnosis of bipolar disorder and schizoaffective disorder, feeling rather depressed and starting to see hallucinations.  He was having thoughts of shooting himself for the past 2-3 days prior to admission.  She was also having some paranoid feelings.  Patient was reporting decreased sleep, decreased appetite but no specific weight loss indicated.  Patient reports seeing talking faces and knowing that this was not normal behavior.  Patient also admitted to having some homicidal ideation but no plan or intent on day of admission.  PAST PSYCHIATRIC HISTORY:  Patient sees Dr. Ladona Ridgel on an outpatient basis.  PAST MEDICAL HISTORY:  Primary care Yosef Krogh none.  Medical problems include history of appendectomy, hernia repair and chronic mild lumbar pain.  CURRENT MEDICATIONS:  Geodon 60 mg p.o. b.i.d., Celexa 20 mg q.d.  DRUG ALLERGIES:  No known drug allergies.  PHYSICAL EXAMINATION:  Patients vital signs were normal and having no somatic complaints upon interview.  MENTAL STATUS EXAMINATION:  He is a casually dressed white male, quite disheveled looking.  Hygiene poor.  Flat affect.  He is polite and cooperative.  Speech is slow and pace and soft is tone.  Mood is depressed and guarded.  Patient has obvious psychomotor retardation.  Thought processes are moderately disorganized with obvious thought-blocking.  Positive suicidal ideation with no specific plan or intent and positive for paranoid  ideation. Negative for homicidal ideation.  No evidence of auditory or visual hallucinations during interview.  Cognitively, he is oriented to person, place and day but not the date.  ADMITTING DIAGNOSES: Axis I:    Rule out bipolar disorder, depressed, versus schizoaffective            disorder and major depression. Axis II:   Deferred. Axis III:  None. Axis IV:   Moderate problems (related to primary support group with marital            stress and separation from his wife). Axis V:    Current 35; past year 34.  HOSPITAL COURSE:  Patient was admitted to stabilize his mood.  Will monitor patient every 15 minutes and encourage Gatorade for electrolytes since patients appetite has decreased.  Will obtain old records and start him on an antidepressant.  We will also start him on Zyprexa for psychotic symptoms and resume his Geodon b.i.d.  Patient initially was feeling less anxious.  He was having occasional hallucinations but presented with no suicidal or homicidal ideation.  His Geodon was increased.  Patient continued having some paranoid ideation, although he was sleeping better.  We increased his Geodon again.  CONDITION ON DISCHARGE:  Persecutory delusions had completely cleared.  There are no more hallucinations.  Patient was sleeping well.  His affect was bright.  He was having no dangerous ideas.  He was tolerating his medications well without any side effects.  We felt patient was ready to be discharged on outpatient basis.  Patient will be discharged to outpatient psychiatry services.  Social worker will follow up with an appointment for the patient. There were no other restrictions.  Patient was instructed to call for any problems with his medications or dangerous thoughts.  DISCHARGE MEDICATIONS: 1. Celexa 20 mg 1 q.a.m. 2. Geodon 20 mg 1 q.a.m., 1 at 4 p.m. and 3 q.h.s.  DISCHARGE DIAGNOSES: Axis I:    Rule out bipolar disorder, depressed, versus schizoaffective             disorder and major depression. Axis II:   Deferred. Axis III:  None. Axis IV:   Moderate psychosocial stressors. Axis V:    Current 55; this past year 74. DD:  01/09/01 TD:  01/10/01 Job: [id number]

## 2011-01-11 NOTE — Discharge Summary (Signed)
NAME:  Travis [name], Travis [name]                        ACCOUNT NO.:  [id number]   MEDICAL RECORD NO.:  [id number]                   PATIENT TYPE:  IPS   LOCATION:  0507                                 FACILITY:  [facility]   PHYSICIAN:  Geoffery Lyons, M.D.                   DATE OF BIRTH:  07-Jun-1961   DATE OF ADMISSION:  02/28/2003  DATE OF DISCHARGE:  03/04/2003                                 DISCHARGE SUMMARY   CHIEF COMPLAINT AND PRESENT ILLNESS:  This was one of several admissions to  [facility] for this 50 year old male who is separated and  voluntarily admitted.  History of schizoaffective disorder, walking,  claiming to be scared, tearful, hearing voices in his head, claimed they  don't like me, that the voices appeared two or three days prior to this  admission.  Becoming progressively more afraid that people do not like him.  Generally paranoid.  Claimed that he was having bad ideas but denies any  active ideas to hurt himself.  Endorsing no depressed mood, no anhedonia,  vague paranoia, lying in bed for last two or three days before this  admission.   PAST PSYCHIATRIC HISTORY:  Has been at [facility] at least  four times.  Did not follow up when he was discharged last time.   ALCOHOL/DRUG HISTORY:  Denies the use or abuse of any substances.   PAST MEDICAL HISTORY:  Chronic low back pain, hernia repair, appendectomy,  high blood pressure.   PHYSICAL EXAMINATION:  Performed and failed to show any acute findings.   MENTAL STATUS EXAM:  Disheveled, medium-built male with normal motor exam  with very slight psychomotor slowing.  Body odor.  Speech is soft without  pressure.  Gives minimal answers.  Mood is guarded, mildly anxious.  Thought  processes remarkable for no suicidal or homicidal ideation, some auditory  hallucinations, appears anxious and somewhat suspicious.  Cognition well-  preserved.   ADMISSION DIAGNOSES:   AXIS I:  Schizoaffective  disorder.   AXIS II:  No diagnosis.   AXIS III:  Chronic back pain.   AXIS IV:  Moderate.   AXIS V:  Global Assessment of Functioning upon admission 23; highest Global  Assessment of Functioning in the last year 60.   LABORATORY DATA:  CBC with hemoglobin 17.1.  Blood chemistry within normal  limits.  Thyroid within normal limits.  Drug screen negative for substances  of abuse.   HOSPITAL COURSE:  He was admitted and started intensive individual and group  psychotherapy.  He was placed on Geodon 60 mg twice a day, Klonopin 0.25 mg  twice a day, Celexa 20 mg in the morning and Ambien as needed for sleep.  By  second day of hospitalization, back on medications.  Started feeling better.  Understood that it was important for him to stay on medications if he was  going to  have long-term stability.  He slept.  Was experiencing less  anxiety.  No further hallucinations.  On March 04, 2003, he was in full  contact with reality.  No suicidal ideation.  No homicidal ideation.  Increased insight in terms of the need to stay on medication.  Willing and  motivated to pursue outpatient treatment.   DISCHARGE DIAGNOSES:   AXIS I:  Schizoaffective disorder.   AXIS II:  No diagnosis.   AXIS III:  Chronic back pain.   AXIS IV:  Moderate.   AXIS V:  Global Assessment of Functioning upon discharge 50-55.   DISCHARGE MEDICATIONS:  1. Geodon 60 mg twice a day.  2. Celexa 20 mg daily.  3. Klonopin 0.25 mg twice a day.  4. Multivitamin 1 daily.   FOLLOW UP:  Dr. Evelene Croon.                                               Geoffery Lyons, M.D.    IL/MEDQ  D:  04/27/2003  T:  04/28/2003  Job:  [id number]

## 2011-01-11 NOTE — Discharge Summary (Signed)
   NAME:  Travis [name], Travis [name]                        ACCOUNT NO.:  [id number]   MEDICAL RECORD NO.:  [id number]                   PATIENT TYPE:  INP   LOCATION:  4732                                 FACILITY:  [facility]   PHYSICIAN:  Asencion Partridge, M.D.                  DATE OF BIRTH:  08/20/1961   DATE OF ADMISSION:  03/19/2003  DATE OF DISCHARGE:  03/22/2003                                 DISCHARGE SUMMARY   DISCHARGE DIAGNOSES:  1. Multiple drug overdose.  2. Suicide attempt.  3. Depression.   DISCHARGE MEDICATIONS:  1. Celexa 20 mg daily.  2. Clonazepam 0.25 mg b.i.d.  3. Zolpidem 10 mg q.h.s.   DISPOSITION:  The patient will be transferred to [facility] and followup will be decided upon discharge from there.   PROCEDURES PERFORMED:  None.   CONSULTATIONS:  [facility], Dr. Jeanie Sewer.   HISTORY OF PRESENT ILLNESS:  This is a 50 year old white male with a history  of depression who was brought to the emergency department by his roommate  who noted a mildly abnormal gait and the patient admitted to taking Geodon,  Celexa, Ambien, and Clonazepam. The patient had been recently discharged  from [facility], approximately three weeks before  suicide attempt.   HOSPITAL COURSE:  #1- OVERDOSE.  The patient was admitted to telemetry for  observation. All medications were held until the patient was alert and  taking p.o. Once the patient was alert and oriented Celexa, Clonazepam, and  Ambien were restarted.  #2 - SUICIDE ATTEMPT. The patient was given a 24-hour sitter to be in the  room with him and behavioral medicine was consulted for readmission to  behavioral medicine. D      Anastasio Auerbach, MD                          Asencion Partridge, M.D.    AD/MEDQ  D:  03/22/2003  T:  03/22/2003  Job:  [id number]   cc:   Antonietta Breach, M.D.  [address]  [place], [place] [address]  Fax: [phone]   [facility]  Behavioral Medicine

## 2011-02-21 LAB — BASIC METABOLIC PANEL: Glucose: 204 mg/dL

## 2011-08-11 ENCOUNTER — Ambulatory Visit (INDEPENDENT_AMBULATORY_CARE_PROVIDER_SITE_OTHER): Payer: 59

## 2011-08-11 ENCOUNTER — Ambulatory Visit (HOSPITAL_COMMUNITY)
Admission: RE | Admit: 2011-08-11 | Discharge: 2011-08-11 | Disposition: A | Discharge status: Home / Self Care | Payer: Self-pay | Source: Ambulatory Visit | Attending: Family Medicine | Admitting: Family Medicine

## 2011-08-11 DIAGNOSIS — M79609 Pain in unspecified limb: Secondary | ICD-10-CM | POA: Insufficient documentation

## 2011-08-11 DIAGNOSIS — R52 Pain, unspecified: Secondary | ICD-10-CM

## 2011-08-11 DIAGNOSIS — E119 Type 2 diabetes mellitus without complications: Secondary | ICD-10-CM

## 2011-08-11 LAB — HEMOGLOBIN A1C: Hgb A1c MFr Bld: 10.6 % — AB (ref 4.0–6.0)

## 2011-09-07 ENCOUNTER — Encounter: Payer: Self-pay | Admitting: *Deleted

## 2011-09-07 DIAGNOSIS — Z720 Tobacco use: Secondary | ICD-10-CM

## 2011-09-07 DIAGNOSIS — IMO0001 Reserved for inherently not codable concepts without codable children: Secondary | ICD-10-CM

## 2011-09-07 DIAGNOSIS — E785 Hyperlipidemia, unspecified: Secondary | ICD-10-CM

## 2011-09-07 DIAGNOSIS — E559 Vitamin D deficiency, unspecified: Secondary | ICD-10-CM | POA: Insufficient documentation

## 2011-09-07 DIAGNOSIS — E119 Type 2 diabetes mellitus without complications: Secondary | ICD-10-CM | POA: Insufficient documentation

## 2011-10-08 ENCOUNTER — Encounter: Payer: Self-pay | Admitting: Emergency Medicine

## 2011-10-20 ENCOUNTER — Other Ambulatory Visit: Payer: Self-pay | Admitting: Family Medicine

## 2011-10-20 MED ORDER — GLUCOSE BLOOD VI STRP: ORAL_STRIP | Status: AC

## 2011-11-22 ENCOUNTER — Other Ambulatory Visit: Payer: Self-pay | Admitting: Emergency Medicine

## 2011-12-11 ENCOUNTER — Other Ambulatory Visit: Payer: Self-pay

## 2011-12-11 MED ORDER — AMLODIPINE BESYLATE 10 MG PO TABS: 10.0000 mg | ORAL_TABLET | Freq: Every day | ORAL | Status: DC

## 2011-12-27 ENCOUNTER — Other Ambulatory Visit: Payer: Self-pay | Admitting: Physician Assistant

## 2012-01-20 ENCOUNTER — Other Ambulatory Visit: Payer: Self-pay | Admitting: Family Medicine

## 2012-02-17 ENCOUNTER — Other Ambulatory Visit (HOSPITAL_COMMUNITY): Payer: Self-pay | Admitting: Family Medicine

## 2012-02-17 ENCOUNTER — Ambulatory Visit (HOSPITAL_COMMUNITY)
Admission: RE | Admit: 2012-02-17 | Discharge: 2012-02-17 | Disposition: A | Discharge status: Home / Self Care | Payer: 59 | Source: Ambulatory Visit | Attending: Family Medicine | Admitting: Family Medicine

## 2012-02-17 DIAGNOSIS — M25569 Pain in unspecified knee: Secondary | ICD-10-CM

## 2012-02-17 DIAGNOSIS — M1710 Unilateral primary osteoarthritis, unspecified knee: Secondary | ICD-10-CM | POA: Insufficient documentation

## 2012-02-17 DIAGNOSIS — IMO0002 Reserved for concepts with insufficient information to code with codable children: Secondary | ICD-10-CM | POA: Insufficient documentation

## 2012-03-03 ENCOUNTER — Other Ambulatory Visit: Payer: Self-pay | Admitting: Physician Assistant

## 2012-05-19 ENCOUNTER — Other Ambulatory Visit: Payer: Self-pay | Admitting: Physician Assistant

## 2012-05-19 NOTE — Telephone Encounter (Signed)
Chart pulled to PA pool at nurse station [phone]

## 2012-10-05 ENCOUNTER — Other Ambulatory Visit: Payer: Self-pay | Admitting: Family Medicine

## 2012-10-05 NOTE — Telephone Encounter (Signed)
Chart pulled to PA pool at nurses station [phone]

## 2012-10-05 NOTE — Telephone Encounter (Signed)
Please pull paper chart. Medication not in epic.

## 2012-11-24 ENCOUNTER — Other Ambulatory Visit (HOSPITAL_COMMUNITY): Payer: Self-pay | Admitting: Internal Medicine

## 2012-11-24 ENCOUNTER — Ambulatory Visit (HOSPITAL_COMMUNITY)
Admission: RE | Admit: 2012-11-24 | Discharge: 2012-11-24 | Disposition: A | Discharge status: Home / Self Care | Payer: 59 | Source: Ambulatory Visit | Attending: Internal Medicine | Admitting: Internal Medicine

## 2012-11-24 DIAGNOSIS — R509 Fever, unspecified: Secondary | ICD-10-CM | POA: Insufficient documentation

## 2012-11-24 DIAGNOSIS — R05 Cough: Secondary | ICD-10-CM | POA: Insufficient documentation

## 2012-11-24 DIAGNOSIS — R079 Chest pain, unspecified: Secondary | ICD-10-CM | POA: Insufficient documentation

## 2012-11-24 DIAGNOSIS — R059 Cough, unspecified: Secondary | ICD-10-CM | POA: Insufficient documentation

## 2013-03-22 ENCOUNTER — Other Ambulatory Visit (HOSPITAL_COMMUNITY): Payer: Self-pay | Admitting: Internal Medicine

## 2013-03-22 DIAGNOSIS — R109 Unspecified abdominal pain: Secondary | ICD-10-CM

## 2013-03-22 DIAGNOSIS — R10811 Right upper quadrant abdominal tenderness: Secondary | ICD-10-CM

## 2013-03-24 ENCOUNTER — Ambulatory Visit (INDEPENDENT_AMBULATORY_CARE_PROVIDER_SITE_OTHER): Payer: 59 | Admitting: Family Medicine

## 2013-03-24 ENCOUNTER — Ambulatory Visit (HOSPITAL_COMMUNITY): Payer: 59

## 2013-03-24 VITALS — BP 118/68 | HR 96 | Temp 98.0°F | Resp 16 | Ht 64.25 in | Wt 195.4 lb

## 2013-03-24 DIAGNOSIS — R040 Epistaxis: Secondary | ICD-10-CM

## 2013-03-24 LAB — POCT CBC
Granulocyte percent: 71.1 %G (ref 37–80)
HCT, POC: 48.2 % (ref 43.5–53.7)
Hemoglobin: 15.6 g/dL (ref 14.1–18.1)
Lymph, poc: 2.9 (ref 0.6–3.4)
MCH, POC: 29.7 pg (ref 27–31.2)
MCHC: 32.4 g/dL (ref 31.8–35.4)
MCV: 91.6 fL (ref 80–97)
MID (cbc): 1.2 — AB (ref 0–0.9)
MPV: 9.5 fL (ref 0–99.8)
POC Granulocyte: 10 — AB (ref 2–6.9)
POC LYMPH PERCENT: 20.7 %L (ref 10–50)
POC MID %: 8.2 %M (ref 0–12)
Platelet Count, POC: 234 10*3/uL (ref 142–424)
RBC: 5.26 M/uL (ref 4.69–6.13)
RDW, POC: 13.6 %
WBC: 14.1 10*3/uL — AB (ref 4.6–10.2)

## 2013-03-24 LAB — PROTIME-INR
INR: 0.86 (ref <1.50)
Prothrombin Time: 11.8 seconds (ref 11.6–15.2)

## 2013-03-24 NOTE — Progress Notes (Addendum)
[facility] [address] [place] [address] [phone]  Date:  03/24/2013   Name:  Travis [name]   DOB:  11/22/1960   MRN:  [id number]  PCP:  Cassell Smiles., MD    Chief Complaint: Epistaxis   History of Present Illness:  Travis [name] is a 52 y.o. very pleasant male patient who presents with the following:  He is here today with a nosebleed.  The left side has been bleeding off and off since yesterday.  Marland Kitchen   He went to the [facility] doctor yesterday for this nosebleed.  The nose was packed, but whenever he tries to remove the packing he will "gush" blood.  He took the packing out this morning but had bleeding and repacked it.    No bleeding from the right nostril.  No gum bleeding.  No gross hematuria- however he did have microscopic hematuria at his recent CPE which is being evaluated by his PCP in [place], [place].    He has been on asirin, but has not taken for the last 2 days.  Otherwise he is feeling ok today Patient Active Problem List   Diagnosis Date Noted  . Tobacco chew use   . NIDDM (non-insulin dependent diabetes mellitus)   . Dyslipidemia   . Unspecified vitamin D deficiency   . 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 ESOPHAGEAL REFLUX 09/28/2007  . DYSPHAGIA UNSPECIFIED 09/28/2007  . BLOOD IN STOOL, OCCULT 09/28/2007  . HERNIORRHAPHY, HX OF 09/28/2007    Past Medical History  Diagnosis Date  . Tobacco chew use   . Fatigue     NAUSEA,BLOATING  . H. pylori infection 09/15/2006  . Hematochezia 08/30/2007  . NIDDM (non-insulin dependent diabetes mellitus) 2011  . Dyslipidemia   . Unspecified vitamin D deficiency     Past Surgical History  Procedure Laterality Date  . Appendectomy    . Knee surgery      BOTH  . Hernia repair      RIGHT    History  Substance Use Topics  . Smoking status: Unknown If Ever Smoked  . Smokeless tobacco: Current User    Types: Chew  . Alcohol Use: Not on file     Family History  Problem Relation Age of Onset  . Stroke Father 30  . Pneumonia Father   . Heart attack      DECEASED IN THEIR 40'S  . Heart failure Mother 50    MI  . Heart disease Mother   . Cancer      X2 MATERNAL SIDE    Allergies  Allergen Reactions  . Lisinopril Swelling    ACE ANGIOEDEMA    Medication list has been reviewed and updated.  Current Outpatient Prescriptions on File Prior to Visit  Medication Sig Dispense Refill  . amLODipine (NORVASC) 10 MG tablet Take 1 tablet (10 mg total) by mouth daily. NEEDS OFFICE VISIT FOR MORE  30 tablet  0  . aspirin 81 MG tablet Take 81 mg by mouth daily.      . metFORMIN (GLUCOPHAGE) 1000 MG tablet Take 1,000 mg by mouth 2 (two) times daily with a meal.      . pravastatin (PRAVACHOL) 40 MG tablet TAKE 1 TABLET BY MOUTH EVERY DAY / NEEDS VISIT BEFORE ADDITIONAL REFILLS  15 tablet  0  . simvastatin (ZOCOR) 40 MG tablet Take 40 mg by mouth every evening.       No current facility-administered medications  on file prior to visit.    Review of Systems:  As per HPI- otherwise negative.   Physical Examination: Filed Vitals:   03/24/13 1135  BP: 144/80  Pulse: 105  Temp: 98 F (36.7 C)  Resp: 16   Filed Vitals:   03/24/13 1135  Height: 5' 4.25" (1.632 m)  Weight: 195 lb 6.4 oz (88.633 kg)   Body mass index is 33.28 kg/(m^2). Ideal Body Weight: Weight in (lb) to have BMI = 25: 146.5  GEN: WDWN, NAD, Non-toxic, A & O x 3, obese, looks well HEENT: Atraumatic, Normocephalic. Neck supple. No masses, No LAD. Bilateral TM wnl, oropharynx normal.  PEERL,EOMI.   Removed packing from left nostril.  He does not appear to have active bleeding. Ears and Nose: No external deformity. CV: RRR, No M/G/R. No JVD. No thrill. No extra heart sounds. PULM: CTA B, no wheezes, crackles, rhonchi. No retractions. No resp. distress. No accessory muscle use. EXTR: No c/c/e NEURO Normal gait.  PSYCH: Normally interactive. Conversant. Not  depressed or anxious appearing.  Calm demeanor.   Results for orders placed in visit on 03/24/13  POCT CBC      Result Value Range   WBC 14.1 (*) 4.6 - 10.2 K/uL   Lymph, poc 2.9  0.6 - 3.4   POC LYMPH PERCENT 20.7  10 - 50 %L   MID (cbc) 1.2 (*) 0 - 0.9   POC MID % 8.2  0 - 12 %M   POC Granulocyte 10.0 (*) 2 - 6.9   Granulocyte percent 71.1  37 - 80 %G   RBC 5.26  4.69 - 6.13 M/uL   Hemoglobin 15.6  14.1 - 18.1 g/dL   HCT, POC 16.1  09.6 - 53.7 %   MCV 91.6  80 - 97 fL   MCH, POC 29.7  27 - 31.2 pg   MCHC 32.4  31.8 - 35.4 g/dL   RDW, POC 04.5     Platelet Count, POC 234  142 - 424 K/uL   MPV 9.5  0 - 99.8 fL    Assessment and Plan: Bleeding nose - Plan: POCT CBC  Persistent epistaxis since yesterday.  At this time acute bleeding seems to have stopped, but it will likely bleed again.  Pt would like to see ENT for possible cautery of bleeding vessel.  appt to see Dr. Rosita Fire at 1pm today.    Signed Abbe Amsterdam, MD  Called pt in the evening to discuss his CBC, but had to [facility]. He did have leukocytosis as well- this could be due to infection or to stress. If any fever, etc please let me know.  Otherwise will plan to recheck in a few days.  Will place order for future CBC Called on 7/31- LMOM again.  PT/INR normal.  Please be sure to come in for repeat CBC. Let me know if anything else needed

## 2013-03-24 NOTE — Patient Instructions (Addendum)
Dr. Ferman Hamming [phone] [address] on the [address] of [address]  Fax [phone]  Please proceed for a 1am appt

## 2013-03-25 ENCOUNTER — Ambulatory Visit (HOSPITAL_COMMUNITY): Payer: 59

## 2013-03-29 ENCOUNTER — Ambulatory Visit (HOSPITAL_COMMUNITY): Admission: RE | Admit: 2013-03-29 | Payer: 59 | Source: Ambulatory Visit

## 2013-03-31 ENCOUNTER — Ambulatory Visit (HOSPITAL_COMMUNITY): Payer: 59

## 2013-04-14 ENCOUNTER — Encounter: Payer: Self-pay | Admitting: *Deleted

## 2013-04-16 ENCOUNTER — Encounter: Payer: Self-pay | Admitting: Cardiovascular Disease

## 2013-04-19 ENCOUNTER — Ambulatory Visit: Payer: 59 | Admitting: Internal Medicine

## 2013-10-27 ENCOUNTER — Telehealth: Payer: Self-pay | Admitting: *Deleted

## 2013-11-05 ENCOUNTER — Encounter: Payer: Self-pay | Admitting: *Deleted

## 2013-11-11 ENCOUNTER — Ambulatory Visit: Payer: 59 | Admitting: Internal Medicine

## 2013-12-03 ENCOUNTER — Ambulatory Visit: Payer: 59 | Admitting: Internal Medicine

## 2013-12-27 NOTE — Telephone Encounter (Signed)
Encounter Closed---12/27/13 TP 

## 2014-01-13 ENCOUNTER — Encounter: Payer: Self-pay | Admitting: Cardiology

## 2014-01-13 ENCOUNTER — Ambulatory Visit (INDEPENDENT_AMBULATORY_CARE_PROVIDER_SITE_OTHER): Payer: 59 | Admitting: Cardiology

## 2014-01-13 VITALS — BP 142/90 | HR 92 | Ht 65.0 in | Wt 191.4 lb

## 2014-01-13 DIAGNOSIS — R079 Chest pain, unspecified: Secondary | ICD-10-CM | POA: Insufficient documentation

## 2014-01-13 DIAGNOSIS — R0989 Other specified symptoms and signs involving the circulatory and respiratory systems: Secondary | ICD-10-CM

## 2014-01-13 DIAGNOSIS — Z79899 Other long term (current) drug therapy: Secondary | ICD-10-CM

## 2014-01-13 DIAGNOSIS — E785 Hyperlipidemia, unspecified: Secondary | ICD-10-CM

## 2014-01-13 DIAGNOSIS — R0600 Dyspnea, unspecified: Secondary | ICD-10-CM | POA: Insufficient documentation

## 2014-01-13 DIAGNOSIS — R0609 Other forms of dyspnea: Secondary | ICD-10-CM

## 2014-01-13 DIAGNOSIS — Z8249 Family history of ischemic heart disease and other diseases of the circulatory system: Secondary | ICD-10-CM | POA: Insufficient documentation

## 2014-01-13 DIAGNOSIS — E782 Mixed hyperlipidemia: Secondary | ICD-10-CM

## 2014-01-13 MED ORDER — NITROGLYCERIN 0.4 MG SL SUBL: 0.4000 mg | SUBLINGUAL_TABLET | SUBLINGUAL | Status: DC | PRN

## 2014-01-13 NOTE — Progress Notes (Signed)
01/13/2014 Travis [name]   1961-02-12  [id number]  Primary Physicia Glo Herring., MD Primary Cardiologist: Dr Debara Pickett  HPI:  53 y/o followed by cardiology in the past. He had a low risk Nuclear stress test and a normal echo in 2010. He last saw Dr Debara Pickett in 2013. He has DM, HTN, dyslipidemia, and a family history of CAD. He is in the office today because he had an episode of Lt arm numbness and Lt sided chest pain a week ago while mowing. He says he occasionally get SOB but its not always with exertion.    Current Outpatient Prescriptions  Medication Sig Dispense Refill  . amLODipine (NORVASC) 10 MG tablet Take 1 tablet (10 mg total) by mouth daily. NEEDS OFFICE VISIT FOR MORE  30 tablet  0  . aspirin 325 MG tablet Take 325 mg by mouth daily.      Marland Kitchen diltiazem (DILACOR XR) 120 MG 24 hr capsule Take 120 mg by mouth daily.      Marland Kitchen glipiZIDE (GLUCOTROL XL) 10 MG 24 hr tablet Take 10 mg by mouth daily.      . metFORMIN (GLUCOPHAGE) 1000 MG tablet Take 1,000 mg by mouth daily with breakfast.       . indomethacin (INDOCIN) 25 MG capsule Take 50 mg by mouth daily.      . nitroGLYCERIN (NITROSTAT) 0.4 MG SL tablet Place 1 tablet (0.4 mg total) under the tongue every 5 (five) minutes as needed for chest pain.  25 tablet  6   No current facility-administered medications for this visit.    Allergies  Allergen Reactions  . Lisinopril Swelling    ACE ANGIOEDEMA    History   Social History  . Marital Status: Divorced    Spouse Name: N/A    Number of Children: 2  . Years of Education: N/A   Occupational History  .  Unemployed   Social History Main Topics  . Smoking status: Never Smoker   . Smokeless tobacco: Current User    Types: Chew  . Alcohol Use: Not on file  . Drug Use: Not on file  . Sexual Activity: Not on file   Other Topics Concern  . Not on file   Social History Narrative  . No narrative on file     Review of Systems: General: negative for chills, fever,  night sweats or weight changes.  Cardiovascular: negative for chest pain, dyspnea on exertion, edema, orthopnea, palpitations, paroxysmal nocturnal dyspnea or shortness of breath Dermatological: negative for rash Respiratory: negative for cough or wheezing Urologic: negative for hematuria Abdominal: negative for nausea, vomiting, diarrhea, bright red blood per rectum, melena, or hematemesis Neurologic: negative for visual changes, syncope, or dizziness All other systems reviewed and are otherwise negative except as noted above.    Blood pressure 142/90, pulse 92, height 5\' 5"  (1.651 m), weight 191 lb 6.4 oz (86.818 kg).  General appearance: alert, cooperative and no distress Neck: no carotid bruit and no JVD Lungs: clear to auscultation bilaterally Heart: regular rate and rhythm Abdomen: soft, non-tender; bowel sounds normal; no masses,  no organomegaly Extremities: extremities normal, atraumatic, no cyanosis or edema Pulses: 2+ and symmetric Skin: Skin color, texture, turgor normal. No rashes or lesions Neurologic: Grossly normal He had some psychiatric issues in the past (2004) but apparently this has been stable.   EKG NSR   ASSESSMENT AND PLAN:   Chest pain with moderate risk for cardiac etiology Some chest pain and dyspnea last month  while mowing  Dyspnea Not always exertional  NIDDM (non-insulin dependent diabetes mellitus) .  Dyslipidemia He had been on a statin but it was "stopped by my doctor". He is not sure why  Family history of coronary artery disease- (mother had MI 47) .    PLAN  His symptoms are not completely typical but he has multiple cardiac risk factors incuding DM. I will arrange an echo and stress Myoview. I also suggested we check his lipids as he will be fasting.  He was given an Rx for SL NTG with instructions on how and why to take it. His wife accompanied him today and was present during the interview and exam.   Doreene Burke  [facility] 01/13/2014 3:37 PM

## 2014-01-13 NOTE — Assessment & Plan Note (Signed)
Some chest pain and dyspnea last month while mowing

## 2014-01-13 NOTE — Patient Instructions (Signed)
Your physician has requested that you have en exercise stress myoview. For further information please visit [email or web address]. Please follow instruction sheet, as given.  Your physician has requested that you have an echocardiogram. Echocardiography is a painless test that uses sound waves to create images of your heart. It provides your doctor with information about the size and shape of your heart and how well your heart's chambers and valves are working. This procedure takes approximately one hour. There are no restrictions for this procedure.  A new prescription has been sent to your pharmacy for nitroglycerin.  Your physician recommends that you schedule a follow-up appointment in: 3 weeks.

## 2014-01-13 NOTE — Assessment & Plan Note (Signed)
Not always exertional

## 2014-01-13 NOTE — Assessment & Plan Note (Signed)
He had been on a statin but it was "stopped by my doctor". He is not sure why

## 2014-01-18 ENCOUNTER — Other Ambulatory Visit (HOSPITAL_COMMUNITY): Payer: Self-pay | Admitting: Internal Medicine

## 2014-01-18 DIAGNOSIS — R109 Unspecified abdominal pain: Secondary | ICD-10-CM

## 2014-01-18 DIAGNOSIS — R319 Hematuria, unspecified: Secondary | ICD-10-CM

## 2014-01-20 ENCOUNTER — Ambulatory Visit (HOSPITAL_COMMUNITY)
Admission: RE | Admit: 2014-01-20 | Discharge: 2014-01-20 | Disposition: A | Discharge status: Home / Self Care | Payer: 59 | Source: Ambulatory Visit | Attending: Internal Medicine | Admitting: Internal Medicine

## 2014-01-20 ENCOUNTER — Ambulatory Visit (HOSPITAL_COMMUNITY)
Admission: RE | Admit: 2014-01-20 | Discharge: 2014-01-20 | Disposition: A | Discharge status: Home / Self Care | Payer: 59 | Source: Ambulatory Visit | Attending: Cardiovascular Disease | Admitting: Cardiovascular Disease

## 2014-01-20 DIAGNOSIS — R079 Chest pain, unspecified: Secondary | ICD-10-CM

## 2014-01-20 DIAGNOSIS — K7689 Other specified diseases of liver: Secondary | ICD-10-CM | POA: Insufficient documentation

## 2014-01-20 DIAGNOSIS — R109 Unspecified abdominal pain: Secondary | ICD-10-CM | POA: Insufficient documentation

## 2014-01-20 DIAGNOSIS — I517 Cardiomegaly: Secondary | ICD-10-CM

## 2014-01-20 DIAGNOSIS — N200 Calculus of kidney: Secondary | ICD-10-CM | POA: Insufficient documentation

## 2014-01-20 DIAGNOSIS — K4090 Unilateral inguinal hernia, without obstruction or gangrene, not specified as recurrent: Secondary | ICD-10-CM | POA: Insufficient documentation

## 2014-01-20 DIAGNOSIS — R319 Hematuria, unspecified: Secondary | ICD-10-CM | POA: Insufficient documentation

## 2014-01-20 DIAGNOSIS — N400 Enlarged prostate without lower urinary tract symptoms: Secondary | ICD-10-CM | POA: Insufficient documentation

## 2014-01-20 DIAGNOSIS — R0600 Dyspnea, unspecified: Secondary | ICD-10-CM

## 2014-01-20 LAB — POCT I-STAT CREATININE: Creatinine, Ser: 1.3 mg/dL (ref 0.50–1.35)

## 2014-01-20 MED ORDER — IOHEXOL 300 MG/ML  SOLN
125.0000 mL | Freq: Once | INTRAMUSCULAR | Status: AC | PRN
  Administered 2014-01-20: 125 mL via INTRAVENOUS

## 2014-01-20 MED ORDER — SODIUM CHLORIDE 0.9 % IV SOLN
INTRAVENOUS | Status: AC
  Filled 2014-01-20: qty 250

## 2014-01-20 NOTE — Progress Notes (Signed)
2D Echocardiogram Complete.  01/20/2014   Kayelee Herbig, RDCS  

## 2014-01-24 ENCOUNTER — Telehealth (HOSPITAL_COMMUNITY): Payer: Self-pay

## 2014-01-27 ENCOUNTER — Ambulatory Visit (HOSPITAL_COMMUNITY)
Admission: RE | Admit: 2014-01-27 | Discharge: 2014-01-27 | Disposition: A | Discharge status: Home / Self Care | Payer: 59 | Source: Ambulatory Visit | Attending: Internal Medicine | Admitting: Internal Medicine

## 2014-01-27 ENCOUNTER — Encounter (HOSPITAL_COMMUNITY): Payer: 59

## 2014-01-27 DIAGNOSIS — R002 Palpitations: Secondary | ICD-10-CM | POA: Insufficient documentation

## 2014-01-27 DIAGNOSIS — Z8249 Family history of ischemic heart disease and other diseases of the circulatory system: Secondary | ICD-10-CM | POA: Insufficient documentation

## 2014-01-27 DIAGNOSIS — R0602 Shortness of breath: Secondary | ICD-10-CM | POA: Insufficient documentation

## 2014-01-27 DIAGNOSIS — R079 Chest pain, unspecified: Secondary | ICD-10-CM | POA: Insufficient documentation

## 2014-01-27 DIAGNOSIS — R0600 Dyspnea, unspecified: Secondary | ICD-10-CM

## 2014-01-27 DIAGNOSIS — R0989 Other specified symptoms and signs involving the circulatory and respiratory systems: Secondary | ICD-10-CM | POA: Insufficient documentation

## 2014-01-27 DIAGNOSIS — E119 Type 2 diabetes mellitus without complications: Secondary | ICD-10-CM | POA: Insufficient documentation

## 2014-01-27 DIAGNOSIS — I10 Essential (primary) hypertension: Secondary | ICD-10-CM | POA: Insufficient documentation

## 2014-01-27 DIAGNOSIS — R0609 Other forms of dyspnea: Secondary | ICD-10-CM | POA: Insufficient documentation

## 2014-01-27 DIAGNOSIS — E663 Overweight: Secondary | ICD-10-CM | POA: Insufficient documentation

## 2014-01-27 MED ORDER — TECHNETIUM TC 99M SESTAMIBI GENERIC - CARDIOLITE
10.6000 | Freq: Once | INTRAVENOUS | Status: AC | PRN
  Administered 2014-01-27: 11 via INTRAVENOUS

## 2014-01-27 MED ORDER — TECHNETIUM TC 99M SESTAMIBI GENERIC - CARDIOLITE
31.4000 | Freq: Once | INTRAVENOUS | Status: AC | PRN
  Administered 2014-01-27: 31 via INTRAVENOUS

## 2014-01-27 NOTE — Procedures (Addendum)
[place] [facility] [address] [place] [address] [phone]  Cardiology Nuclear Med Study  Travis [name] is a 53 y.o. male     MRN : [id number]     DOB: [birth year]  Procedure Date: 01/27/2014  Nuclear Med Background Indication for Stress Test:  Evaluation for Ischemia History:  No prior cardiac or respiratory history reported; Last NUC MPI on 03/2009-nonischemic;EF=64% Cardiac Risk Factors: Family History - CAD, Hypertension, Lipids, NIDDM and Overweight  Symptoms:  Chest Pain, DOE, Fatigue, Palpitations and SOB   Nuclear Pre-Procedure Caffeine/Decaff Intake:  7:00pm NPO After: 5:00am   IV Site: R Forearm  IV 0.9% NS with Angio Cath:  22g  Chest Size (in):  48"  IV Started by: Rolene Course, RN  Height: 5\' 5"  (1.651 m)  Cup Size: n/a  BMI:  Body mass index is 31.78 kg/(m^2). Weight:  191 lb (86.637 kg)   Tech Comments:  n/a    Nuclear Med Study 1 or 2 day study: 1 day  Stress Test Type:  Stress  Order Authorizing Provider:  Lyman Bishop, MD   Resting Radionuclide: Technetium 58m Sestamibi  Resting Radionuclide Dose: 10.6 mCi   Stress Radionuclide:  Technetium 44m Sestamibi  Stress Radionuclide Dose: 31.4 mCi           Stress Protocol Rest HR: 88 Stress HR: 155  Rest BP: 166/101 Stress BP: 198/97  Exercise Time (min): 8:31 METS: 9.90          Dose of Adenosine (mg):  n/a Dose of Lexiscan: n/a mg  Dose of Atropine (mg): n/a Dose of Dobutamine: n/a mcg/kg/min (at max HR)  Stress Test Technologist: Mellody Memos, CCT Nuclear Technologist: Imagene Riches, CNMT   Rest Procedure:  Myocardial perfusion imaging was performed at rest 45 minutes following the intravenous administration of Technetium 6m Sestamibi. Stress Procedure:  The patient performed treadmill exercise using a Bruce  Protocol for 8 minutes 31 seconds. The patient stopped due to shortness of breath and fatigue.  Patient experienced chest tightness.   There were no significant ST-T wave changes.  Technetium 53m Sestamibi was injected IV at peak exercise and myocardial perfusion imaging was performed after a brief delay.  Transient Ischemic Dilatation (Normal <1.22):  1.13  QGS EDV:  73 ml QGS ESV:  33 ml LV Ejection Fraction: 55%        Rest ECG: NSR - Normal EKG  Stress ECG: No significant change from baseline ECG  QPS Raw Data Images:  Normal; no motion artifact; normal heart/lung ratio. Stress Images:  Normal homogeneous uptake in all areas of the myocardium. Rest Images:  Normal homogeneous uptake in all areas of the myocardium. Subtraction (SDS):  Normal  Impression Exercise Capacity:  Good exercise capacity. BP Response:  Hypertensive diastolic blood pressure response. Clinical Symptoms:  Mild chest pain/dyspnea. ECG Impression:  No significant ST segment change suggestive of ischemia. Comparison with Prior Nuclear Study: No significant change from previous study  Overall Impression:  Normal stress nuclear study.  LV Wall Motion:  NL LV Function, EF 55%; NL Wall Motion   Travis Sine, MD  01/27/2014 12:38 PM

## 2014-01-28 ENCOUNTER — Telehealth: Payer: Self-pay | Admitting: Internal Medicine

## 2014-01-28 NOTE — Telephone Encounter (Signed)
[facility] LPN SPOKE TO PATIENT RESULTS GIVEN

## 2014-01-28 NOTE — Telephone Encounter (Signed)
Patient states that he is returning a call from [place].

## 2014-02-03 ENCOUNTER — Ambulatory Visit: Payer: 59 | Admitting: Internal Medicine

## 2014-02-04 ENCOUNTER — Encounter (HOSPITAL_COMMUNITY): Payer: 59

## 2014-03-03 ENCOUNTER — Ambulatory Visit: Payer: 59 | Admitting: Internal Medicine

## 2014-03-03 NOTE — Telephone Encounter (Signed)
Encounter complete. 

## 2014-04-20 ENCOUNTER — Encounter: Payer: Self-pay | Admitting: Gastroenterology

## 2014-10-14 ENCOUNTER — Encounter (HOSPITAL_COMMUNITY): Payer: Self-pay | Admitting: *Deleted

## 2014-10-14 ENCOUNTER — Emergency Department (HOSPITAL_COMMUNITY)
Admission: EM | Admit: 2014-10-14 | Discharge: 2014-10-14 | Disposition: A | Discharge status: Home / Self Care | Payer: 59 | Attending: Emergency Medicine | Admitting: Emergency Medicine

## 2014-10-14 ENCOUNTER — Emergency Department (HOSPITAL_COMMUNITY): Payer: 59

## 2014-10-14 DIAGNOSIS — R05 Cough: Secondary | ICD-10-CM | POA: Insufficient documentation

## 2014-10-14 DIAGNOSIS — M549 Dorsalgia, unspecified: Secondary | ICD-10-CM | POA: Diagnosis not present

## 2014-10-14 DIAGNOSIS — Z79899 Other long term (current) drug therapy: Secondary | ICD-10-CM | POA: Insufficient documentation

## 2014-10-14 DIAGNOSIS — I10 Essential (primary) hypertension: Secondary | ICD-10-CM | POA: Insufficient documentation

## 2014-10-14 DIAGNOSIS — Z8719 Personal history of other diseases of the digestive system: Secondary | ICD-10-CM | POA: Insufficient documentation

## 2014-10-14 DIAGNOSIS — R112 Nausea with vomiting, unspecified: Secondary | ICD-10-CM | POA: Insufficient documentation

## 2014-10-14 DIAGNOSIS — R51 Headache: Secondary | ICD-10-CM | POA: Diagnosis not present

## 2014-10-14 DIAGNOSIS — Z8659 Personal history of other mental and behavioral disorders: Secondary | ICD-10-CM | POA: Diagnosis not present

## 2014-10-14 DIAGNOSIS — E119 Type 2 diabetes mellitus without complications: Secondary | ICD-10-CM | POA: Insufficient documentation

## 2014-10-14 DIAGNOSIS — R0602 Shortness of breath: Secondary | ICD-10-CM | POA: Insufficient documentation

## 2014-10-14 DIAGNOSIS — R519 Headache, unspecified: Secondary | ICD-10-CM

## 2014-10-14 DIAGNOSIS — Z8619 Personal history of other infectious and parasitic diseases: Secondary | ICD-10-CM | POA: Diagnosis not present

## 2014-10-14 DIAGNOSIS — Z7982 Long term (current) use of aspirin: Secondary | ICD-10-CM | POA: Insufficient documentation

## 2014-10-14 DIAGNOSIS — Z791 Long term (current) use of non-steroidal anti-inflammatories (NSAID): Secondary | ICD-10-CM | POA: Diagnosis not present

## 2014-10-14 LAB — BASIC METABOLIC PANEL
Anion gap: 4 — ABNORMAL LOW (ref 5–15)
BUN: 18 mg/dL (ref 6–23)
CO2: 24 mmol/L (ref 19–32)
Calcium: 9.5 mg/dL (ref 8.4–10.5)
Chloride: 109 mmol/L (ref 96–112)
Creatinine, Ser: 0.94 mg/dL (ref 0.50–1.35)
GFR calc Af Amer: >90 mL/min (ref >90)
GFR calc non Af Amer: >90 mL/min (ref >90)
Glucose, Bld: 127 mg/dL — ABNORMAL HIGH (ref 70–99)
Potassium: 4.1 mmol/L (ref 3.5–5.1)
Sodium: 137 mmol/L (ref 135–145)

## 2014-10-14 LAB — CBC WITH DIFFERENTIAL/PLATELET
Basophils Absolute: 0 10*3/uL (ref 0.0–0.1)
Basophils Relative: 0 % (ref 0–1)
Eosinophils Absolute: 0.3 10*3/uL (ref 0.0–0.7)
Eosinophils Relative: 2 % (ref 0–5)
HCT: 47.9 % (ref 39.0–52.0)
Hemoglobin: 16.3 g/dL (ref 13.0–17.0)
Lymphocytes Relative: 23 % (ref 12–46)
Lymphs Abs: 3.2 10*3/uL (ref 0.7–4.0)
MCH: 29.9 pg (ref 26.0–34.0)
MCHC: 34 g/dL (ref 30.0–36.0)
MCV: 87.9 fL (ref 78.0–100.0)
Monocytes Absolute: 1 10*3/uL (ref 0.1–1.0)
Monocytes Relative: 7 % (ref 3–12)
Neutro Abs: 9.5 10*3/uL — ABNORMAL HIGH (ref 1.7–7.7)
Neutrophils Relative %: 68 % (ref 43–77)
Platelets: 234 10*3/uL (ref 150–400)
RBC: 5.45 MIL/uL (ref 4.22–5.81)
RDW: 12.4 % (ref 11.5–15.5)
WBC: 14 10*3/uL — ABNORMAL HIGH (ref 4.0–10.5)

## 2014-10-14 MED ORDER — SODIUM CHLORIDE 0.9 % IV BOLUS (SEPSIS)
500.0000 mL | Freq: Once | INTRAVENOUS | Status: AC
  Administered 2014-10-14: 1000 mL via INTRAVENOUS

## 2014-10-14 MED ORDER — KETOROLAC TROMETHAMINE 30 MG/ML IJ SOLN
30.0000 mg | Freq: Once | INTRAMUSCULAR | Status: AC
Start: 2014-10-14 — End: 2014-10-14
  Administered 2014-10-14: 30 mg via INTRAVENOUS
  Filled 2014-10-14: qty 1

## 2014-10-14 MED ORDER — DIPHENHYDRAMINE HCL 50 MG/ML IJ SOLN
25.0000 mg | Freq: Once | INTRAMUSCULAR | Status: AC
  Administered 2014-10-14: 25 mg via INTRAVENOUS
  Filled 2014-10-14: qty 1

## 2014-10-14 MED ORDER — METOCLOPRAMIDE HCL 5 MG/ML IJ SOLN
10.0000 mg | Freq: Once | INTRAMUSCULAR | Status: AC
  Administered 2014-10-14: 10 mg via INTRAVENOUS
  Filled 2014-10-14: qty 2

## 2014-10-14 MED ORDER — ETODOLAC 400 MG PO TABS: 400.0000 mg | ORAL_TABLET | Freq: Two times a day (BID) | ORAL | Status: DC | PRN

## 2014-10-14 MED ORDER — PROMETHAZINE HCL 25 MG PO TABS: 25.0000 mg | ORAL_TABLET | Freq: Four times a day (QID) | ORAL | Status: DC | PRN

## 2014-10-14 NOTE — ED Notes (Signed)
Headache for 1 week, vomited x 2 today.  No diarrhea. No HI.

## 2014-10-14 NOTE — Discharge Instructions (Signed)
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
## 2014-10-14 NOTE — ED Notes (Signed)
MD at bedside. 

## 2015-03-31 ENCOUNTER — Other Ambulatory Visit (HOSPITAL_COMMUNITY): Payer: Self-pay | Admitting: Internal Medicine

## 2015-03-31 ENCOUNTER — Ambulatory Visit (HOSPITAL_COMMUNITY)
Admission: RE | Admit: 2015-03-31 | Discharge: 2015-03-31 | Disposition: A | Discharge status: Home / Self Care | Payer: 59 | Source: Ambulatory Visit | Attending: Internal Medicine | Admitting: Internal Medicine

## 2015-03-31 DIAGNOSIS — R079 Chest pain, unspecified: Secondary | ICD-10-CM | POA: Diagnosis not present

## 2015-03-31 DIAGNOSIS — Z7729 Contact with and (suspected ) exposure to other hazardous substances: Secondary | ICD-10-CM

## 2015-03-31 DIAGNOSIS — R0781 Pleurodynia: Secondary | ICD-10-CM

## 2015-06-14 ENCOUNTER — Other Ambulatory Visit (HOSPITAL_COMMUNITY): Payer: Self-pay | Admitting: Internal Medicine

## 2015-06-14 DIAGNOSIS — R1031 Right lower quadrant pain: Secondary | ICD-10-CM

## 2015-06-16 ENCOUNTER — Ambulatory Visit (HOSPITAL_COMMUNITY): Admission: RE | Admit: 2015-06-16 | Payer: 59 | Source: Ambulatory Visit

## 2016-01-12 ENCOUNTER — Telehealth: Payer: Self-pay | Admitting: Cardiovascular Disease

## 2016-01-12 NOTE — Telephone Encounter (Signed)
Received records from [facility] for appointment on 01/19/16 with Dr Bronson Ing Ledell Noss.  Records will be faxed to [facility]. lp

## 2016-01-19 ENCOUNTER — Ambulatory Visit (INDEPENDENT_AMBULATORY_CARE_PROVIDER_SITE_OTHER): Payer: 59 | Admitting: Cardiovascular Disease

## 2016-01-19 ENCOUNTER — Encounter: Payer: Self-pay | Admitting: Cardiovascular Disease

## 2016-01-19 ENCOUNTER — Encounter: Payer: Self-pay | Admitting: *Deleted

## 2016-01-19 VITALS — BP 156/96 | HR 86 | Ht 65.0 in | Wt 200.0 lb

## 2016-01-19 DIAGNOSIS — R600 Localized edema: Secondary | ICD-10-CM | POA: Diagnosis not present

## 2016-01-19 DIAGNOSIS — I10 Essential (primary) hypertension: Secondary | ICD-10-CM

## 2016-01-19 DIAGNOSIS — R0602 Shortness of breath: Secondary | ICD-10-CM

## 2016-01-19 DIAGNOSIS — R079 Chest pain, unspecified: Secondary | ICD-10-CM

## 2016-01-19 DIAGNOSIS — Z136 Encounter for screening for cardiovascular disorders: Secondary | ICD-10-CM | POA: Diagnosis not present

## 2016-01-19 MED ORDER — NITROGLYCERIN 0.4 MG SL SUBL: 0.4000 mg | SUBLINGUAL_TABLET | SUBLINGUAL | Status: DC | PRN

## 2016-01-19 MED ORDER — CHLORTHALIDONE 25 MG PO TABS: 25.0000 mg | ORAL_TABLET | Freq: Every day | ORAL | Status: DC

## 2016-01-19 NOTE — Progress Notes (Signed)
Patient ID: Travis [name], male   DOB: Jun 15, 1961, 55 y.o.   MRN: [id number]      SUBJECTIVE: The patient is a 55 year old male with no known history of coronary artery disease. He has a history of chest pain, hypertension, dyslipidemia, and diabetes mellitus. His mother sustained a myocardial infarction at age 31. He underwent a normal nuclear stress test in June 2015. Echocardiogram May 2015 showed normal left ventricular systolic function, EF 0000000, and grade 1 diastolic dysfunction with normal regional wall motion.  Labs 12/28/15 hemoglobin 14, platelets 213, normal troponin, BUN 16, creatinine 1, total) 148, trig glycerides 237, HDL 23, LDL 78.  ECG performed in the office today which I personally reviewed demonstrates normal sinus rhythm with no ischemic ST segment or T-wave abnormalities, nor any arrhythmias.  His wife is a Marine scientist.  In the past 6 months he has developed bilateral leg swelling. He is also short of breath and has chest pains in the left precordial region radiating into the left shoulder and back both with exertion and while sitting. He denies syncope. He was prescribed Lasix by his PCP 2 weeks ago and has had to use it on 4 different occasions.  Review of Systems: As per "subjective", otherwise negative.  Allergies  Allergen Reactions  . Doxycycline     Breaks out in blisters  . Lisinopril Swelling    ACE ANGIOEDEMA    Current Outpatient Prescriptions  Medication Sig Dispense Refill  . amLODipine (NORVASC) 10 MG tablet Take 1 tablet (10 mg total) by mouth daily. NEEDS OFFICE VISIT FOR MORE 30 tablet 0  . aspirin EC 81 MG tablet Take 81 mg by mouth daily.    . cefUROXime (CEFTIN) 250 MG tablet Take 1 tablet by mouth 2 (two) times daily.    Marland Kitchen diltiazem (DILACOR XR) 120 MG 24 hr capsule Take 240 mg by mouth daily.     . furosemide (LASIX) 20 MG tablet Take 1 tablet by mouth daily as needed.    Marland Kitchen glipiZIDE (GLUCOTROL XL) 10 MG 24 hr tablet Take 10 mg by mouth 2  (two) times daily.     . indomethacin (INDOCIN) 25 MG capsule Take 50 mg by mouth 2 (two) times daily with a meal.     . loratadine (CLARITIN) 10 MG tablet Take 10 mg by mouth daily.    Marland Kitchen losartan (COZAAR) 100 MG tablet Take 100 mg by mouth daily.    . meclizine (ANTIVERT) 25 MG tablet Take 25 mg by mouth 3 (three) times daily as needed for nausea.    . metFORMIN (GLUCOPHAGE) 1000 MG tablet Take 1,000 mg by mouth 2 (two) times daily with a meal.     . nitroGLYCERIN (NITROSTAT) 0.4 MG SL tablet Place 1 tablet (0.4 mg total) under the tongue every 5 (five) minutes as needed for chest pain. 25 tablet 6  . omeprazole (PRILOSEC) 40 MG capsule Take 40 mg by mouth daily.    . tamsulosin (FLOMAX) 0.4 MG CAPS capsule Take 0.4 mg by mouth daily after breakfast.    . traZODone (DESYREL) 100 MG tablet Take 1 tablet by mouth daily.     No current facility-administered medications for this visit.    Past Medical History  Diagnosis Date  . Tobacco chew use   . Fatigue     NAUSEA,BLOATING  . H. pylori infection 09/15/2006  . Hematochezia 08/30/2007  . NIDDM (non-insulin dependent diabetes mellitus) 2011    type 2  . Dyslipidemia   .  Unspecified vitamin D deficiency   . Hypertension     treated  . History of nuclear stress test 04/03/2009    exercise myoview; normal pattern of perfusion; low risk scan   . Schizoaffective disorder [facility])     Past Surgical History  Procedure Laterality Date  . Appendectomy    . Knee surgery      BOTH  . Hernia repair      RIGHT  . Transthoracic echocardiogram  04/03/2009    EF=>55%; trace TR, mild pulm regurg    Social History   Social History  . Marital Status: Married    Spouse Name: N/A  . Number of Children: 2  . Years of Education: N/A   Occupational History  .  Unemployed   Social History Main Topics  . Smoking status: Never Smoker   . Smokeless tobacco: Current User    Types: Snuff, Chew     Comment: started chewing tobacco at age 12 years  old  . Alcohol Use: No  . Drug Use: No  . Sexual Activity: Not on file   Other Topics Concern  . Not on file   Social History Narrative     Filed Vitals:   01/19/16 1005  BP: 156/96  Pulse: 86  Height: 5\' 5"  (1.651 m)  Weight: 200 lb (90.719 kg)    PHYSICAL EXAM General: NAD HEENT: Normal. Neck: No JVD, no thyromegaly. Lungs: Clear to auscultation bilaterally with normal respiratory effort. CV: Nondisplaced PMI.  Regular rate and rhythm, normal S1/S2, no S3/S4, no murmur. No pretibial or periankle edema.  No carotid bruit.   Abdomen: Soft, obese. Neurologic: Alert and oriented.  Psych: Normal affect. Skin: Normal. Musculoskeletal: No gross deformities.    ECG: Most recent ECG reviewed.      ASSESSMENT AND PLAN: 1. Chest pain and shortness of breath: Given multiple CV risk factors, I want to assess for occult ischemic heart disease. I will proceed with a nuclear myocardial perfusion imaging study (Lexiscan due to knee pain b/l) to evaluate for ischemic heart disease.  2. Bilateral leg edema: Takes Lasix prn. I will order a 2-D echocardiogram with Doppler to evaluate cardiac structure, function, and regional wall motion. Will add chlorthalidone 25 mg daily which should also help to alleviate leg edema.  3. Essential HTN: Elevated on current therapy. Will add chlorthalidone 25 mg daily which should also help to alleviate leg edema.  Dispo: fu 6 weeks.  Time spent: 40 minutes, of which greater than 50% was spent reviewing symptoms, relevant blood tests and studies, and discussing management plan with the patient.   Kate Sable, M.D., F.A.C.C.

## 2016-01-19 NOTE — Patient Instructions (Addendum)
Your physician has requested that you have a lexiscan myoview. For further information please visit [email or web address]. Please follow instruction sheet, as given. Your physician has requested that you have an echocardiogram. Echocardiography is a painless test that uses sound waves to create images of your heart. It provides your doctor with information about the size and shape of your heart and how well your heart's chambers and valves are working. This procedure takes approximately one hour. There are no restrictions for this procedure. Office will contact with results via phone or letter.   Begin Chlorthalidone 25mg  daily - new sent to [facility] today.  Continue all other current medications. Follow up in  6 weeks.

## 2016-01-25 ENCOUNTER — Ambulatory Visit: Payer: 59 | Admitting: Cardiology

## 2016-01-31 ENCOUNTER — Encounter (HOSPITAL_COMMUNITY)
Admission: RE | Admit: 2016-01-31 | Discharge: 2016-01-31 | Disposition: A | Discharge status: Home / Self Care | Payer: 59 | Source: Ambulatory Visit | Attending: Cardiovascular Disease | Admitting: Cardiovascular Disease

## 2016-01-31 ENCOUNTER — Ambulatory Visit (HOSPITAL_COMMUNITY)
Admission: RE | Admit: 2016-01-31 | Discharge: 2016-01-31 | Disposition: A | Discharge status: Home / Self Care | Payer: 59 | Source: Ambulatory Visit | Attending: Cardiovascular Disease | Admitting: Cardiovascular Disease

## 2016-01-31 ENCOUNTER — Encounter (HOSPITAL_COMMUNITY): Payer: Self-pay

## 2016-01-31 ENCOUNTER — Encounter (HOSPITAL_COMMUNITY): Admission: RE | Admit: 2016-01-31 | Payer: 59 | Source: Ambulatory Visit

## 2016-01-31 DIAGNOSIS — R079 Chest pain, unspecified: Secondary | ICD-10-CM

## 2016-01-31 DIAGNOSIS — R600 Localized edema: Secondary | ICD-10-CM

## 2016-01-31 DIAGNOSIS — E785 Hyperlipidemia, unspecified: Secondary | ICD-10-CM | POA: Insufficient documentation

## 2016-01-31 LAB — NM MYOCAR MULTI W/SPECT W/WALL MOTION / EF
LV dias vol: 60 mL (ref 62–150)
LV sys vol: 22 mL
Peak HR: 111 {beats}/min
RATE: 0.24
Rest HR: 86 {beats}/min
SDS: 0
SRS: 0
SSS: 0
TID: 1.01

## 2016-01-31 LAB — ECHOCARDIOGRAM COMPLETE
E decel time: 203 msec
E/e' ratio: 8.16
FS: 39 % (ref 28–44)
IVS/LV PW RATIO, ED: 1
LA ID, A-P, ES: 39 mm
LA diam end sys: 39 mm
LA diam index: 1.97 cm/m2
LA vol A4C: 35.4 ml
LA vol index: 18 mL/m2
LA vol: 35.7 mL
LV E/e' medial: 8.16
LV E/e'average: 8.16
LV PW d: 9.19 mm — AB (ref 0.6–1.1)
LV dias vol index: 31 mL/m2
LV dias vol: 61 mL — AB (ref 62–150)
LV e' LATERAL: 9.79 cm/s
LV sys vol index: 10 mL/m2
LV sys vol: 21 mL (ref 21–61)
LVOT area: 3.14 cm2
LVOT diameter: 20 mm
MV Dec: 203
MV Peak grad: 3 mmHg
MV pk A vel: 81.6 m/s
MV pk E vel: 79.9 m/s
Simpson's disk: 66
Stroke v: 41 ml
TAPSE: 19.1 mm
TDI e' lateral: 9.79
TDI e' medial: 6.8

## 2016-01-31 MED ORDER — SODIUM CHLORIDE 0.9% FLUSH
INTRAVENOUS | Status: AC
  Administered 2016-01-31: 10 mL via INTRAVENOUS
  Filled 2016-01-31: qty 10

## 2016-01-31 MED ORDER — REGADENOSON 0.4 MG/5ML IV SOLN
INTRAVENOUS | Status: AC
  Administered 2016-01-31: 0.4 mg via INTRAVENOUS
  Filled 2016-01-31: qty 5

## 2016-01-31 MED ORDER — TECHNETIUM TC 99M TETROFOSMIN IV KIT
10.0000 | PACK | Freq: Once | INTRAVENOUS | Status: AC | PRN
  Administered 2016-01-31: 10 via INTRAVENOUS

## 2016-01-31 MED ORDER — TECHNETIUM TC 99M TETROFOSMIN IV KIT
30.0000 | PACK | Freq: Once | INTRAVENOUS | Status: AC | PRN
  Administered 2016-01-31: 30 via INTRAVENOUS

## 2016-02-01 ENCOUNTER — Telehealth: Payer: Self-pay | Admitting: Cardiovascular Disease

## 2016-02-01 NOTE — Telephone Encounter (Signed)
Travis [name] called requesting test results from stress test.

## 2016-02-02 NOTE — Telephone Encounter (Signed)
Notes Recorded by Laurine Blazer, LPN on [id number] at [phone] PM Wife [name]) notified. Copy to pmd. Follow up already scheduled for 02/28/2016.

## 2016-02-02 NOTE — Telephone Encounter (Signed)
ECHO -   Notes Recorded by Herminio Commons, MD on 02/01/2016 at 8:15 AM Normal echo.  STRESS TEST -   Notes Recorded by Herminio Commons, MD on 02/01/2016 at 8:11 AM Normal.

## 2016-02-28 ENCOUNTER — Ambulatory Visit: Payer: 59 | Admitting: Cardiovascular Disease

## 2016-03-13 ENCOUNTER — Other Ambulatory Visit: Payer: Self-pay | Admitting: Cardiovascular Disease

## 2016-03-15 ENCOUNTER — Encounter: Payer: Self-pay | Admitting: Cardiovascular Disease

## 2016-03-15 ENCOUNTER — Ambulatory Visit (INDEPENDENT_AMBULATORY_CARE_PROVIDER_SITE_OTHER): Payer: 59 | Admitting: Cardiovascular Disease

## 2016-03-15 VITALS — BP 118/79 | HR 109 | Ht 65.0 in | Wt 201.0 lb

## 2016-03-15 DIAGNOSIS — R0602 Shortness of breath: Secondary | ICD-10-CM | POA: Diagnosis not present

## 2016-03-15 DIAGNOSIS — R600 Localized edema: Secondary | ICD-10-CM | POA: Diagnosis not present

## 2016-03-15 DIAGNOSIS — R079 Chest pain, unspecified: Secondary | ICD-10-CM | POA: Diagnosis not present

## 2016-03-15 DIAGNOSIS — I10 Essential (primary) hypertension: Secondary | ICD-10-CM

## 2016-03-15 DIAGNOSIS — Z136 Encounter for screening for cardiovascular disorders: Secondary | ICD-10-CM

## 2016-03-15 DIAGNOSIS — IMO0001 Reserved for inherently not codable concepts without codable children: Secondary | ICD-10-CM

## 2016-03-15 MED ORDER — CHLORTHALIDONE 25 MG PO TABS: 25.0000 mg | ORAL_TABLET | Freq: Every day | ORAL | Status: DC

## 2016-03-15 NOTE — Patient Instructions (Signed)
Medication Instructions:  Chlorthalidone refilled today - 90 day with 3 refills to [facility]. Continue all other medications.    Labwork: NONE   Testing/Procedures: NONE  Follow-Up: As needed.   Any Other Special Instructions Will Be Listed Below (If Applicable).  If you need a refill on your cardiac medications before your next appointment, please call your pharmacy.

## 2016-03-15 NOTE — Progress Notes (Signed)
Patient ID: Travis [name], male   DOB: October 07, 1960, 55 y.o.   MRN: [id number]      SUBJECTIVE: The patient returns for follow-up after undergoing cardiovascular testing performed for the evaluation of chest pain, SOB, and leg swelling. Nuclear stress testing and echocardiography were normal.  He said he likes to eat and will have difficulty controlling his appetite.  Review of Systems: As per "subjective", otherwise negative.  Allergies  Allergen Reactions  . Doxycycline     Breaks out in blisters  . Lisinopril Swelling    ACE ANGIOEDEMA    Current Outpatient Prescriptions  Medication Sig Dispense Refill  . amLODipine (NORVASC) 10 MG tablet Take 1 tablet (10 mg total) by mouth daily. NEEDS OFFICE VISIT FOR MORE 30 tablet 0  . aspirin EC 81 MG tablet Take 81 mg by mouth daily.    . chlorthalidone (HYGROTON) 25 MG tablet Take 1 tablet (25 mg total) by mouth daily. 30 tablet 6  . diltiazem (DILACOR XR) 120 MG 24 hr capsule Take 240 mg by mouth daily.     . furosemide (LASIX) 20 MG tablet Take 1 tablet by mouth daily as needed.    Marland Kitchen glipiZIDE (GLUCOTROL XL) 10 MG 24 hr tablet Take 10 mg by mouth 2 (two) times daily.     . indomethacin (INDOCIN) 25 MG capsule Take 50 mg by mouth 2 (two) times daily with a meal.     . loratadine (CLARITIN) 10 MG tablet Take 10 mg by mouth daily.    Marland Kitchen losartan (COZAAR) 100 MG tablet Take 100 mg by mouth daily.    . meclizine (ANTIVERT) 25 MG tablet Take 25 mg by mouth 3 (three) times daily as needed for nausea.    . metFORMIN (GLUCOPHAGE) 1000 MG tablet Take 1,000 mg by mouth 2 (two) times daily with a meal.     . nitroGLYCERIN (NITROSTAT) 0.4 MG SL tablet See AUX Label 75 tablet 3  . omeprazole (PRILOSEC) 40 MG capsule Take 40 mg by mouth daily.    . tamsulosin (FLOMAX) 0.4 MG CAPS capsule Take 0.4 mg by mouth daily after breakfast.    . traZODone (DESYREL) 100 MG tablet Take 1 tablet by mouth daily.     No current facility-administered medications  for this visit.    Past Medical History  Diagnosis Date  . Tobacco chew use   . Fatigue     NAUSEA,BLOATING  . H. pylori infection 09/15/2006  . Hematochezia 08/30/2007  . NIDDM (non-insulin dependent diabetes mellitus) 2011    type 2  . Dyslipidemia   . Unspecified vitamin D deficiency   . Hypertension     treated  . History of nuclear stress test 04/03/2009    exercise myoview; normal pattern of perfusion; low risk scan   . Schizoaffective disorder [facility])     Past Surgical History  Procedure Laterality Date  . Appendectomy    . Knee surgery      BOTH  . Hernia repair      RIGHT  . Transthoracic echocardiogram  04/03/2009    EF=>55%; trace TR, mild pulm regurg    Social History   Social History  . Marital Status: Married    Spouse Name: N/A  . Number of Children: 2  . Years of Education: N/A   Occupational History  .  Unemployed   Social History Main Topics  . Smoking status: Never Smoker   . Smokeless tobacco: Current User    Types: Snuff, Chew  Comment: started chewing tobacco at age 65 years old-stopped chewing and dips snuff only  . Alcohol Use: No  . Drug Use: No  . Sexual Activity: Not on file   Other Topics Concern  . Not on file   Social History Narrative     Filed Vitals:   03/15/16 1556  BP: 118/79  Pulse: 109  Height: 5\' 5"  (1.651 m)  Weight: 201 lb (91.173 kg)  SpO2: 97%    PHYSICAL EXAM General: NAD HEENT: Normal. Neck: No JVD, no thyromegaly. Lungs: Clear to auscultation bilaterally with normal respiratory effort. CV: Nondisplaced PMI.  Regular rate and rhythm, normal S1/S2, no S3/S4, no murmur. No pretibial or periankle edema.     Abdomen: Soft, nontender, no distention.  Neurologic: Alert and oriented.  Psych: Normal affect. Skin: Normal. Musculoskeletal: No gross deformities.    ECG: Most recent ECG reviewed.      ASSESSMENT AND PLAN: 1. Chest pain and shortness of breath: Normal echo and stress test. No further  CV testing indicated. I emphasized the importance of primary prevention with cardiovascular risk factor modification including diet and exercise and control of hypertension, hyperlipidemia, and diabetes.  2. Bilateral leg edema: Continue chlorthalidone 25 mg daily.  3. Essential HTN: Controlled with addition of chlorthalidone 25 mg daily. No changes.  Dispo: fu prn   Kate Sable, M.D., F.A.C.C.

## 2016-03-26 ENCOUNTER — Encounter: Payer: Self-pay | Admitting: Cardiovascular Disease

## 2016-04-07 ENCOUNTER — Encounter (HOSPITAL_COMMUNITY): Payer: Self-pay | Admitting: Emergency Medicine

## 2016-04-07 ENCOUNTER — Emergency Department (HOSPITAL_COMMUNITY)
Admission: EM | Admit: 2016-04-07 | Discharge: 2016-04-07 | Disposition: A | Discharge status: Home / Self Care | Payer: 59 | Attending: Emergency Medicine | Admitting: Emergency Medicine

## 2016-04-07 DIAGNOSIS — Z7984 Long term (current) use of oral hypoglycemic drugs: Secondary | ICD-10-CM | POA: Insufficient documentation

## 2016-04-07 DIAGNOSIS — E1165 Type 2 diabetes mellitus with hyperglycemia: Secondary | ICD-10-CM | POA: Insufficient documentation

## 2016-04-07 DIAGNOSIS — Z79899 Other long term (current) drug therapy: Secondary | ICD-10-CM | POA: Insufficient documentation

## 2016-04-07 DIAGNOSIS — I10 Essential (primary) hypertension: Secondary | ICD-10-CM | POA: Diagnosis not present

## 2016-04-07 DIAGNOSIS — F259 Schizoaffective disorder, unspecified: Secondary | ICD-10-CM | POA: Insufficient documentation

## 2016-04-07 DIAGNOSIS — Z7982 Long term (current) use of aspirin: Secondary | ICD-10-CM | POA: Diagnosis not present

## 2016-04-07 DIAGNOSIS — R739 Hyperglycemia, unspecified: Secondary | ICD-10-CM

## 2016-04-07 DIAGNOSIS — F17220 Nicotine dependence, chewing tobacco, uncomplicated: Secondary | ICD-10-CM | POA: Diagnosis not present

## 2016-04-07 LAB — BASIC METABOLIC PANEL
Anion gap: 11 (ref 5–15)
BUN: 34 mg/dL — ABNORMAL HIGH (ref 6–20)
CO2: 21 mmol/L — ABNORMAL LOW (ref 22–32)
Calcium: 9.1 mg/dL (ref 8.9–10.3)
Chloride: 103 mmol/L (ref 101–111)
Creatinine, Ser: 1.35 mg/dL — ABNORMAL HIGH (ref 0.61–1.24)
GFR calc Af Amer: >60 mL/min (ref >60)
GFR calc non Af Amer: 57 mL/min — ABNORMAL LOW (ref >60)
Glucose, Bld: 344 mg/dL — ABNORMAL HIGH (ref 65–99)
Potassium: 4 mmol/L (ref 3.5–5.1)
Sodium: 135 mmol/L (ref 135–145)

## 2016-04-07 LAB — CBC WITH DIFFERENTIAL/PLATELET
Basophils Absolute: 0 10*3/uL (ref 0.0–0.1)
Basophils Relative: 0 %
Eosinophils Absolute: 0 10*3/uL (ref 0.0–0.7)
Eosinophils Relative: 0 %
HCT: 39 % (ref 39.0–52.0)
Hemoglobin: 13.2 g/dL (ref 13.0–17.0)
Lymphocytes Relative: 8 %
Lymphs Abs: 2.2 10*3/uL (ref 0.7–4.0)
MCH: 29.2 pg (ref 26.0–34.0)
MCHC: 33.8 g/dL (ref 30.0–36.0)
MCV: 86.3 fL (ref 78.0–100.0)
Monocytes Absolute: 1.8 10*3/uL — ABNORMAL HIGH (ref 0.1–1.0)
Monocytes Relative: 7 %
Neutro Abs: 21.8 10*3/uL — ABNORMAL HIGH (ref 1.7–7.7)
Neutrophils Relative %: 85 %
Platelets: 215 10*3/uL (ref 150–400)
RBC: 4.52 MIL/uL (ref 4.22–5.81)
RDW: 13 % (ref 11.5–15.5)
WBC: 25.8 10*3/uL — ABNORMAL HIGH (ref 4.0–10.5)

## 2016-04-07 LAB — URINE MICROSCOPIC-ADD ON
Bacteria, UA: NONE SEEN
RBC / HPF: NONE SEEN RBC/hpf (ref 0–5)

## 2016-04-07 LAB — URINALYSIS, ROUTINE W REFLEX MICROSCOPIC
Bilirubin Urine: NEGATIVE
Glucose, UA: >1000 mg/dL — AB
Hgb urine dipstick: NEGATIVE
Ketones, ur: NEGATIVE mg/dL
Leukocytes, UA: NEGATIVE
Nitrite: NEGATIVE
Protein, ur: NEGATIVE mg/dL
Specific Gravity, Urine: 1.02 (ref 1.005–1.030)
pH: 5.5 (ref 5.0–8.0)

## 2016-04-07 LAB — CBG MONITORING, ED
Glucose-Capillary: 382 mg/dL — ABNORMAL HIGH (ref 65–99)
Glucose-Capillary: 241 mg/dL — ABNORMAL HIGH (ref 65–99)
Glucose-Capillary: 190 mg/dL — ABNORMAL HIGH (ref 65–99)

## 2016-04-07 MED ORDER — SODIUM CHLORIDE 0.9 % IV BOLUS (SEPSIS)
1000.0000 mL | Freq: Once | INTRAVENOUS | Status: AC
  Administered 2016-04-07: 1000 mL via INTRAVENOUS

## 2016-04-07 MED ORDER — INSULIN ASPART 100 UNIT/ML ~~LOC~~ SOLN
10.0000 [IU] | Freq: Once | SUBCUTANEOUS | Status: AC
  Administered 2016-04-07: 10 [IU] via INTRAVENOUS
  Filled 2016-04-07: qty 1

## 2016-04-07 MED ORDER — SODIUM CHLORIDE 0.9 % IV SOLN
INTRAVENOUS | Status: DC
  Administered 2016-04-07: 20:00:00 via INTRAVENOUS

## 2016-04-07 NOTE — ED Triage Notes (Signed)
Patient had cortisone injections in both knees yesterday morning. Per wife since then patient has been "sleepy" with increase urination and thirst. Patient blood sugars at home 418 and patient has been unable to bring blood sugars down with his oral medications. Patient does not take insulin.

## 2016-04-07 NOTE — ED Triage Notes (Signed)
cbg in triage [id number]

## 2016-04-07 NOTE — Discharge Instructions (Signed)
Works diabetic diet very carefully. Make an appointment to follow-up with your regular doctor this week to have blood sugar recheck. Return for any new or worse symptoms.

## 2016-04-07 NOTE — ED Provider Notes (Signed)
[facility] DEPT Provider Note   CSN: [id number] Arrival date & time: 04/07/16  1718  First Provider Contact:  None       History   Chief Complaint Chief Complaint  Patient presents with  . Hyperglycemia    HPI Travis [name] is a 55 y.o. male.  Patient is a known type II diabetic. Patient had steroid injections in the both knees yesterday. And since that time his blood sugars have been running very high. In the 400 range. This is unusual. He is taking  his oral hypoglycemics. Patient is on metformin. Patient's had increased thirst increased urination. Patient states he is following his diet. Patient denies fever chest pain shortness of breath abdominal pain.      Past Medical History:  Diagnosis Date  . Dyslipidemia   . Fatigue    NAUSEA,BLOATING  . H. pylori infection 09/15/2006  . Hematochezia 08/30/2007  . History of nuclear stress test 04/03/2009   exercise myoview; normal pattern of perfusion; low risk scan   . Hypertension    treated  . NIDDM (non-insulin dependent diabetes mellitus) 2011   type 2  . Schizoaffective disorder ([place])   . Tobacco chew use   . Unspecified vitamin D deficiency     Patient Active Problem List   Diagnosis Date Noted  . Chest pain with moderate risk for cardiac etiology 01/13/2014  . Dyspnea 01/13/2014  . Family history of coronary artery disease- (mother had MI 30) 01/13/2014  . Tobacco chew use   . NIDDM (non-insulin dependent diabetes mellitus)   . Dyslipidemia   . Unspecified vitamin D deficiency   . 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 ESOPHAGEAL REFLUX 09/28/2007  . DYSPHAGIA UNSPECIFIED 09/28/2007  . BLOOD IN STOOL, OCCULT 09/28/2007  . HERNIORRHAPHY, HX OF 09/28/2007    Past Surgical History:  Procedure Laterality Date  . APPENDECTOMY    . HERNIA REPAIR     RIGHT  . KNEE SURGERY     BOTH  . TRANSTHORACIC ECHOCARDIOGRAM  04/03/2009   EF=>55%; trace TR, mild pulm regurg       Home  Medications    Prior to Admission medications   Medication Sig Start Date End Date Taking? Authorizing Provider  amLODipine (NORVASC) 10 MG tablet Take 10 mg by mouth daily.   Yes Historical Provider, MD  aspirin EC 81 MG tablet Take 81 mg by mouth daily.   Yes Historical Provider, MD  chlorthalidone (HYGROTON) 25 MG tablet Take 1 tablet (25 mg total) by mouth daily. 03/15/16  Yes Herminio Commons, MD  diltiazem (DILACOR XR) 240 MG 24 hr capsule Take 240 mg by mouth daily.   Yes Historical Provider, MD  furosemide (LASIX) 20 MG tablet Take 20 mg by mouth daily as needed for edema.    Yes Historical Provider, MD  glipiZIDE (GLUCOTROL XL) 10 MG 24 hr tablet Take 10 mg by mouth 2 (two) times daily.    Yes Historical Provider, MD  indomethacin (INDOCIN) 25 MG capsule Take 50 mg by mouth 2 (two) times daily with a meal.    Yes Historical Provider, MD  loratadine (CLARITIN) 10 MG tablet Take 10 mg by mouth daily.   Yes Historical Provider, MD  losartan (COZAAR) 100 MG tablet Take 100 mg by mouth daily.   Yes Historical Provider, MD  metFORMIN (GLUCOPHAGE) 1000 MG tablet Take 1,000 mg by mouth 2 (two) times daily with a meal.    Yes Historical Provider, MD  nitroGLYCERIN (NITROSTAT) 0.4  MG SL tablet Place 0.4 mg under the tongue every 5 (five) minutes as needed for chest pain.   Yes Historical Provider, MD  omeprazole (PRILOSEC) 40 MG capsule Take 40 mg by mouth daily.   Yes Historical Provider, MD  tamsulosin (FLOMAX) 0.4 MG CAPS capsule Take 0.4 mg by mouth daily after breakfast.   Yes Historical Provider, MD  traZODone (DESYREL) 100 MG tablet Take 100 mg by mouth at bedtime.    Yes Historical Provider, MD    Family History Family History  Problem Relation Age of Onset  . Stroke Father 7  . Pneumonia Father   . Heart failure Mother 42  . Coronary artery disease Mother     also MI  . Heart attack Mother   . Heart attack Maternal Uncle     died in 27s  . Cancer      x2; materal side  .  Diabetes Maternal Grandmother     Social History Social History  Substance Use Topics  . Smoking status: Never Smoker  . Smokeless tobacco: Current User    Types: Snuff, Chew     Comment: started chewing tobacco at age 81 years old-stopped chewing and dips snuff only  . Alcohol use No     Allergies   Doxycycline and Lisinopril   Review of Systems Review of Systems  Constitutional: Negative for fever.  HENT: Negative for congestion.   Respiratory: Negative for shortness of breath.   Cardiovascular: Negative for chest pain.  Gastrointestinal: Negative for abdominal pain.  Endocrine: Positive for polydipsia and polyuria.  Genitourinary: Positive for frequency. Negative for dysuria.  Musculoskeletal: Negative for back pain.  Skin: Negative for rash.  Neurological: Negative for headaches.  Hematological: Does not bruise/bleed easily.  Psychiatric/Behavioral: Negative for confusion.     Physical Exam Updated Vital Signs BP 135/86   Pulse 90   Temp 98 F (36.7 C) (Oral)   Resp 16   Ht 5\' 5"  (1.651 m)   Wt 91.6 kg   SpO2 98%   BMI 33.61 kg/m   Physical Exam  Constitutional: He is oriented to person, place, and time. He appears well-developed and well-nourished.  HENT:  Head: Normocephalic and atraumatic.  Mucous membranes dry.  Eyes: EOM are normal. Pupils are equal, round, and reactive to light.  Neck: Normal range of motion. Neck supple.  Cardiovascular: Normal rate, regular rhythm and normal heart sounds.   Pulmonary/Chest: Effort normal and breath sounds normal. No respiratory distress.  Abdominal: Soft. Bowel sounds are normal. There is no tenderness.  Musculoskeletal: Normal range of motion. He exhibits no edema.  Neurological: He is alert and oriented to person, place, and time. No cranial nerve deficit. He exhibits normal muscle tone. Coordination normal.  Skin: Skin is warm. No rash noted.  Nursing note and vitals reviewed.    ED Treatments / Results    Labs (all labs ordered are listed, but only abnormal results are displayed) Labs Reviewed  CBC WITH DIFFERENTIAL/PLATELET - Abnormal; Notable for the following:       Result Value   WBC 25.8 (*)    Neutro Abs 21.8 (*)    Monocytes Absolute 1.8 (*)    All other components within normal limits  BASIC METABOLIC PANEL - Abnormal; Notable for the following:    CO2 21 (*)    Glucose, Bld 344 (*)    BUN 34 (*)    Creatinine, Ser 1.35 (*)    GFR calc non Af Amer 57 (*)  All other components within normal limits  URINALYSIS, ROUTINE W REFLEX MICROSCOPIC (NOT AT [facility]) - Abnormal; Notable for the following:    Glucose, UA >1000 (*)    All other components within normal limits  URINE MICROSCOPIC-ADD ON - Abnormal; Notable for the following:    Squamous Epithelial / LPF 0-5 (*)    All other components within normal limits  CBG MONITORING, ED - Abnormal; Notable for the following:    Glucose-Capillary 382 (*)    All other components within normal limits  CBG MONITORING, ED - Abnormal; Notable for the following:    Glucose-Capillary 241 (*)    All other components within normal limits  CBG MONITORING, ED - Abnormal; Notable for the following:    Glucose-Capillary 190 (*)    All other components within normal limits    EKG  EKG Interpretation None       Radiology No results found.  Procedures Procedures (including critical care time)  Medications Ordered in ED Medications  0.9 %  sodium chloride infusion ( Intravenous New Bag/Given 04/07/16 2013)  sodium chloride 0.9 % bolus 1,000 mL (0 mLs Intravenous Stopped 04/07/16 2017)  insulin aspart (novoLOG) injection 10 Units (10 Units Intravenous Given 04/07/16 1947)  sodium chloride 0.9 % bolus 1,000 mL (0 mLs Intravenous Stopped 04/07/16 2120)     Initial Impression / Assessment and Plan / ED Course  I have reviewed the triage vital signs and the nursing notes.  Pertinent labs & imaging results that were available during my care  of the patient were reviewed by me and considered in my medical decision making (see chart for details).  Clinical Course    Patient with known diabetes. Taking both of his oral hypoglycemics. Patient had steroid injections in both knees yesterday and since that time is been having trouble with elevated blood sugars. Was getting blood sugars in the 400 range. Patient was concerned about why he is having difficulty controlling it. States he's been taking his diet as appropriate.  Patient's lab workup here not consistent with DKA. Patient was given some IV insulin and 2 L of normal saline. With improvement. Blood sugar down into the 200s. Patient is already maxed out on both of his oral hypoglycemics. May just need better diet control. We'll have patient follow-up with primary care doctor later this week. Suspected steroid injection may be complicating his blood sugar control.  Final Clinical Impressions(s) / ED Diagnoses   Final diagnoses:  Hyperglycemia    New Prescriptions New Prescriptions   No medications on file     Fredia Sorrow, MD 04/07/16 2131

## 2016-04-07 NOTE — ED Notes (Signed)
PT reports having cortisone shots yesterday to knees, high CBG readings since. States some blurry vision and increased thirst. Reports CBG at home usually runs below 200, denies insulin use. A&O x4.

## 2016-09-02 DIAGNOSIS — L84 Corns and callosities: Secondary | ICD-10-CM | POA: Diagnosis not present

## 2016-09-02 DIAGNOSIS — E1165 Type 2 diabetes mellitus with hyperglycemia: Secondary | ICD-10-CM | POA: Diagnosis not present

## 2017-02-14 DIAGNOSIS — E1165 Type 2 diabetes mellitus with hyperglycemia: Secondary | ICD-10-CM | POA: Diagnosis not present

## 2017-02-14 DIAGNOSIS — K219 Gastro-esophageal reflux disease without esophagitis: Secondary | ICD-10-CM | POA: Diagnosis not present

## 2017-02-14 DIAGNOSIS — G894 Chronic pain syndrome: Secondary | ICD-10-CM | POA: Diagnosis not present

## 2017-02-14 DIAGNOSIS — Z1389 Encounter for screening for other disorder: Secondary | ICD-10-CM | POA: Diagnosis not present

## 2017-02-16 ENCOUNTER — Other Ambulatory Visit: Payer: Self-pay | Admitting: Cardiovascular Disease

## 2017-03-13 ENCOUNTER — Emergency Department (HOSPITAL_COMMUNITY): Payer: 59

## 2017-03-13 ENCOUNTER — Inpatient Hospital Stay (HOSPITAL_COMMUNITY)
Admission: EM | Admit: 2017-03-13 | Discharge: 2017-03-18 | DRG: 871 | Disposition: A | Discharge status: Home / Self Care | Payer: 59 | Attending: Internal Medicine | Admitting: Internal Medicine

## 2017-03-13 ENCOUNTER — Encounter (HOSPITAL_COMMUNITY): Payer: Self-pay | Admitting: Emergency Medicine

## 2017-03-13 DIAGNOSIS — R7989 Other specified abnormal findings of blood chemistry: Secondary | ICD-10-CM

## 2017-03-13 DIAGNOSIS — I10 Essential (primary) hypertension: Secondary | ICD-10-CM | POA: Diagnosis present

## 2017-03-13 DIAGNOSIS — E872 Acidosis, unspecified: Secondary | ICD-10-CM

## 2017-03-13 DIAGNOSIS — N281 Cyst of kidney, acquired: Secondary | ICD-10-CM | POA: Diagnosis not present

## 2017-03-13 DIAGNOSIS — R200 Anesthesia of skin: Secondary | ICD-10-CM | POA: Diagnosis not present

## 2017-03-13 DIAGNOSIS — R109 Unspecified abdominal pain: Secondary | ICD-10-CM

## 2017-03-13 DIAGNOSIS — E1143 Type 2 diabetes mellitus with diabetic autonomic (poly)neuropathy: Secondary | ICD-10-CM | POA: Diagnosis present

## 2017-03-13 DIAGNOSIS — K3184 Gastroparesis: Secondary | ICD-10-CM | POA: Diagnosis present

## 2017-03-13 DIAGNOSIS — F259 Schizoaffective disorder, unspecified: Secondary | ICD-10-CM | POA: Diagnosis not present

## 2017-03-13 DIAGNOSIS — R202 Paresthesia of skin: Secondary | ICD-10-CM | POA: Diagnosis not present

## 2017-03-13 DIAGNOSIS — R571 Hypovolemic shock: Secondary | ICD-10-CM | POA: Diagnosis present

## 2017-03-13 DIAGNOSIS — K219 Gastro-esophageal reflux disease without esophagitis: Secondary | ICD-10-CM | POA: Diagnosis not present

## 2017-03-13 DIAGNOSIS — D72829 Elevated white blood cell count, unspecified: Secondary | ICD-10-CM

## 2017-03-13 DIAGNOSIS — K5900 Constipation, unspecified: Secondary | ICD-10-CM | POA: Diagnosis present

## 2017-03-13 DIAGNOSIS — Z833 Family history of diabetes mellitus: Secondary | ICD-10-CM

## 2017-03-13 DIAGNOSIS — R739 Hyperglycemia, unspecified: Secondary | ICD-10-CM

## 2017-03-13 DIAGNOSIS — Z7984 Long term (current) use of oral hypoglycemic drugs: Secondary | ICD-10-CM | POA: Diagnosis not present

## 2017-03-13 DIAGNOSIS — E876 Hypokalemia: Secondary | ICD-10-CM | POA: Diagnosis present

## 2017-03-13 DIAGNOSIS — E869 Volume depletion, unspecified: Secondary | ICD-10-CM

## 2017-03-13 DIAGNOSIS — K760 Fatty (change of) liver, not elsewhere classified: Secondary | ICD-10-CM | POA: Diagnosis not present

## 2017-03-13 DIAGNOSIS — Z823 Family history of stroke: Secondary | ICD-10-CM | POA: Diagnosis not present

## 2017-03-13 DIAGNOSIS — Z720 Tobacco use: Secondary | ICD-10-CM

## 2017-03-13 DIAGNOSIS — A419 Sepsis, unspecified organism: Principal | ICD-10-CM

## 2017-03-13 DIAGNOSIS — M25559 Pain in unspecified hip: Secondary | ICD-10-CM | POA: Diagnosis not present

## 2017-03-13 DIAGNOSIS — Z881 Allergy status to other antibiotic agents status: Secondary | ICD-10-CM | POA: Diagnosis not present

## 2017-03-13 DIAGNOSIS — K5909 Other constipation: Secondary | ICD-10-CM | POA: Diagnosis not present

## 2017-03-13 DIAGNOSIS — I9589 Other hypotension: Secondary | ICD-10-CM

## 2017-03-13 DIAGNOSIS — E785 Hyperlipidemia, unspecified: Secondary | ICD-10-CM | POA: Diagnosis not present

## 2017-03-13 DIAGNOSIS — N179 Acute kidney failure, unspecified: Secondary | ICD-10-CM

## 2017-03-13 DIAGNOSIS — R6521 Severe sepsis with septic shock: Secondary | ICD-10-CM | POA: Diagnosis not present

## 2017-03-13 DIAGNOSIS — R079 Chest pain, unspecified: Secondary | ICD-10-CM | POA: Diagnosis not present

## 2017-03-13 DIAGNOSIS — Z7982 Long term (current) use of aspirin: Secondary | ICD-10-CM

## 2017-03-13 DIAGNOSIS — M549 Dorsalgia, unspecified: Secondary | ICD-10-CM

## 2017-03-13 DIAGNOSIS — G319 Degenerative disease of nervous system, unspecified: Secondary | ICD-10-CM | POA: Diagnosis not present

## 2017-03-13 DIAGNOSIS — I959 Hypotension, unspecified: Secondary | ICD-10-CM | POA: Diagnosis present

## 2017-03-13 DIAGNOSIS — M25569 Pain in unspecified knee: Secondary | ICD-10-CM | POA: Diagnosis present

## 2017-03-13 DIAGNOSIS — Z888 Allergy status to other drugs, medicaments and biological substances status: Secondary | ICD-10-CM

## 2017-03-13 LAB — HEPATIC FUNCTION PANEL
ALT: 33 U/L (ref 17–63)
ALT: 33 U/L (ref 17–63)
AST: 34 U/L (ref 15–41)
AST: 30 U/L (ref 15–41)
Albumin: 3.9 g/dL (ref 3.5–5.0)
Albumin: 3.2 g/dL — ABNORMAL LOW (ref 3.5–5.0)
Alkaline Phosphatase: 42 U/L (ref 38–126)
Alkaline Phosphatase: 36 U/L — ABNORMAL LOW (ref 38–126)
Bilirubin, Direct: <0.1 mg/dL — ABNORMAL LOW (ref 0.1–0.5)
Bilirubin, Direct: <0.1 mg/dL — ABNORMAL LOW (ref 0.1–0.5)
Total Bilirubin: 0.6 mg/dL (ref 0.3–1.2)
Total Bilirubin: 0.5 mg/dL (ref 0.3–1.2)
Total Protein: 6.8 g/dL (ref 6.5–8.1)
Total Protein: 5.7 g/dL — ABNORMAL LOW (ref 6.5–8.1)

## 2017-03-13 LAB — URINALYSIS, COMPLETE (UACMP) WITH MICROSCOPIC
Bacteria, UA: NONE SEEN
Bilirubin Urine: NEGATIVE
Glucose, UA: NEGATIVE mg/dL
Hgb urine dipstick: NEGATIVE
Ketones, ur: NEGATIVE mg/dL
Leukocytes, UA: NEGATIVE
Nitrite: NEGATIVE
Protein, ur: NEGATIVE mg/dL
RBC / HPF: NONE SEEN RBC/hpf (ref 0–5)
Specific Gravity, Urine: 1.014 (ref 1.005–1.030)
Squamous Epithelial / LPF: NONE SEEN
pH: 5 (ref 5.0–8.0)

## 2017-03-13 LAB — LIPASE, BLOOD
Lipase: 74 U/L — ABNORMAL HIGH (ref 11–51)
Lipase: 58 U/L — ABNORMAL HIGH (ref 11–51)

## 2017-03-13 LAB — BASIC METABOLIC PANEL
Anion gap: 22 — ABNORMAL HIGH (ref 5–15)
BUN: 49 mg/dL — ABNORMAL HIGH (ref 6–20)
CO2: 11 mmol/L — ABNORMAL LOW (ref 22–32)
Calcium: 9.6 mg/dL (ref 8.9–10.3)
Chloride: 103 mmol/L (ref 101–111)
Creatinine, Ser: 3.89 mg/dL — ABNORMAL HIGH (ref 0.61–1.24)
GFR calc Af Amer: 18 mL/min — ABNORMAL LOW (ref >60)
GFR calc non Af Amer: 16 mL/min — ABNORMAL LOW (ref >60)
Glucose, Bld: 184 mg/dL — ABNORMAL HIGH (ref 65–99)
Potassium: 4 mmol/L (ref 3.5–5.1)
Sodium: 136 mmol/L (ref 135–145)

## 2017-03-13 LAB — I-STAT TROPONIN, ED
Troponin i, poc: 0 ng/mL (ref 0.00–0.08)
Troponin i, poc: 0 ng/mL (ref 0.00–0.08)

## 2017-03-13 LAB — I-STAT CG4 LACTIC ACID, ED
Lactic Acid, Venous: 4.88 mmol/L (ref 0.5–1.9)
Lactic Acid, Venous: 2.58 mmol/L (ref 0.5–1.9)

## 2017-03-13 LAB — CBC
HCT: 41.8 % (ref 39.0–52.0)
Hemoglobin: 14.1 g/dL (ref 13.0–17.0)
MCH: 28.7 pg (ref 26.0–34.0)
MCHC: 33.7 g/dL (ref 30.0–36.0)
MCV: 85.1 fL (ref 78.0–100.0)
Platelets: 298 10*3/uL (ref 150–400)
RBC: 4.91 MIL/uL (ref 4.22–5.81)
RDW: 13.2 % (ref 11.5–15.5)
WBC: 22.9 10*3/uL — ABNORMAL HIGH (ref 4.0–10.5)

## 2017-03-13 LAB — CORTISOL: Cortisol, Plasma: 15.9 ug/dL

## 2017-03-13 LAB — LACTIC ACID, PLASMA
Lactic Acid, Venous: 2.4 mmol/L (ref 0.5–1.9)
Lactic Acid, Venous: 1.2 mmol/L (ref 0.5–1.9)

## 2017-03-13 LAB — TROPONIN I
Troponin I: <0.03 ng/mL (ref <0.03)
Troponin I: <0.03 ng/mL (ref <0.03)

## 2017-03-13 LAB — SALICYLATE LEVEL: Salicylate Lvl: 25.6 mg/dL (ref 2.8–30.0)

## 2017-03-13 LAB — MRSA PCR SCREENING: MRSA by PCR: NEGATIVE

## 2017-03-13 LAB — CBG MONITORING, ED: Glucose-Capillary: 101 mg/dL — ABNORMAL HIGH (ref 65–99)

## 2017-03-13 LAB — PROCALCITONIN: Procalcitonin: 0.13 ng/mL

## 2017-03-13 MED ORDER — SODIUM CHLORIDE 0.9 % IV SOLN
250.0000 mL | INTRAVENOUS | Status: DC | PRN
Start: 2017-03-13 — End: 2017-03-14

## 2017-03-13 MED ORDER — PIPERACILLIN-TAZOBACTAM IN DEX 2-0.25 GM/50ML IV SOLN
2.2500 g | Freq: Four times a day (QID) | INTRAVENOUS | Status: DC
  Administered 2017-03-13 – 2017-03-14 (×3): 2.25 g via INTRAVENOUS
  Filled 2017-03-13 (×4): qty 50

## 2017-03-13 MED ORDER — SODIUM CHLORIDE 0.9 % IV SOLN
0.0000 ug/min | INTRAVENOUS | Status: DC
  Administered 2017-03-13: 130 ug/min via INTRAVENOUS
  Administered 2017-03-13: 70 ug/min via INTRAVENOUS
  Administered 2017-03-13: 20 ug/min via INTRAVENOUS
  Administered 2017-03-14: 140 ug/min via INTRAVENOUS
  Administered 2017-03-14: 100 ug/min via INTRAVENOUS
  Administered 2017-03-14: 60 ug/min via INTRAVENOUS
  Administered 2017-03-14: 140 ug/min via INTRAVENOUS
  Administered 2017-03-14: 120 ug/min via INTRAVENOUS
  Filled 2017-03-13 (×3): qty 1
  Filled 2017-03-13: qty 10
  Filled 2017-03-13 (×6): qty 1

## 2017-03-13 MED ORDER — SODIUM CHLORIDE 0.9 % IV BOLUS (SEPSIS)
1000.0000 mL | Freq: Once | INTRAVENOUS | Status: AC
  Administered 2017-03-13: 1000 mL via INTRAVENOUS

## 2017-03-13 MED ORDER — VANCOMYCIN HCL IN DEXTROSE 1-5 GM/200ML-% IV SOLN: 1000.0000 mg | Freq: Once | INTRAVENOUS | Status: DC

## 2017-03-13 MED ORDER — SODIUM CHLORIDE 0.9 % IV SOLN
2000.0000 mg | Freq: Once | INTRAVENOUS | Status: AC
  Administered 2017-03-13: 2000 mg via INTRAVENOUS
  Filled 2017-03-13: qty 2000

## 2017-03-13 MED ORDER — ASPIRIN 300 MG RE SUPP: 300.0000 mg | RECTAL | Status: AC

## 2017-03-13 MED ORDER — ASPIRIN 81 MG PO CHEW
324.0000 mg | CHEWABLE_TABLET | ORAL | Status: AC
  Administered 2017-03-13: 324 mg via ORAL
  Filled 2017-03-13: qty 4

## 2017-03-13 MED ORDER — ONDANSETRON HCL 4 MG/2ML IJ SOLN
4.0000 mg | Freq: Once | INTRAMUSCULAR | Status: AC
  Administered 2017-03-13: 4 mg via INTRAVENOUS
  Filled 2017-03-13: qty 2

## 2017-03-13 MED ORDER — HEPARIN SODIUM (PORCINE) 5000 UNIT/ML IJ SOLN
5000.0000 [IU] | Freq: Three times a day (TID) | INTRAMUSCULAR | Status: DC
  Administered 2017-03-13 – 2017-03-18 (×11): 5000 [IU] via SUBCUTANEOUS
  Filled 2017-03-13 (×12): qty 1

## 2017-03-13 MED ORDER — PIPERACILLIN-TAZOBACTAM 3.375 G IVPB 30 MIN
3.3750 g | Freq: Once | INTRAVENOUS | Status: AC
  Administered 2017-03-13: 3.375 g via INTRAVENOUS
  Filled 2017-03-13: qty 50

## 2017-03-13 MED ORDER — ORAL CARE MOUTH RINSE
15.0000 mL | Freq: Two times a day (BID) | OROMUCOSAL | Status: DC
  Administered 2017-03-14 – 2017-03-18 (×3): 15 mL via OROMUCOSAL

## 2017-03-13 MED ORDER — INSULIN ASPART 100 UNIT/ML ~~LOC~~ SOLN: 6.0000 [IU] | Freq: Once | SUBCUTANEOUS | Status: DC

## 2017-03-13 NOTE — Progress Notes (Signed)
[facility] Progress Note Patient Name: Travis [name] DOB: Jul 04, 1961 MRN: [id number]   Date of Service  03/13/2017  HPI/Events of Note  Hypotension - BP = 83/49 s/p 4 liters of crystalloid. No CVL.  eICU Interventions  Will order: 1. Phenylephrine IV infusion. Titrate to MAP >= 65.     Intervention Category Major Interventions: Hypotension - evaluation and management  [name] 03/13/2017, 5:46 PM

## 2017-03-13 NOTE — ED Notes (Signed)
RN, NP notified of decrease in BP

## 2017-03-13 NOTE — H&P (Signed)
PULMONARY / CRITICAL CARE MEDICINE   Name: Travis [name] MRN:   [id number] DOB:   [birth year]         ADMISSION DATE:  03/13/2017 CONSULTATION DATE:  7/19  REFERRING MD:  Ashok Cordia (EDP)   CHIEF COMPLAINT:  Sepsis   HISTORY OF PRESENT ILLNESS:   56yo male with hx HTN, DM, schizoaffective disorder presented 7/19 with initial complaint of L face/cheek and L lateral leg tingling.  In ER continued to have vague c/o chest pain, back pain, abd pain, general malaise, intermittent headaches.  Pt is on metformin.  Initial w/u revealed lactate 4.8, metabolic acidosis, AKI with Scr 3.89, wbc 22.9, no clear source of infection and PCCM consult for ICU admission.     C/o back pain, chest pain, RUQ pain, nausea, mild dyspnea, constipation.  Poor po intake as well as poor urine outpt.   Denies vomiting, diarrhea, melena, syncope, dizziness.   Symptoms have been ongoing x 1-2 weeks but worse over last 24 hours.   PAST MEDICAL HISTORY :  He  has a past medical history of Dyslipidemia; Fatigue; H. pylori infection (09/15/2006); Hematochezia (08/30/2007); History of nuclear stress test (04/03/2009); Hypertension; NIDDM (non-insulin dependent diabetes mellitus) (2011); Schizoaffective disorder ([place]); Tobacco chew use; and Unspecified vitamin D deficiency.   PAST SURGICAL HISTORY: He  has a past surgical history that includes Appendectomy; Knee surgery; Hernia repair; and transthoracic echocardiogram (04/03/2009).       Allergies  Allergen Reactions  . Doxycycline Other (See Comments)    Reaction:  Blisters   . Lisinopril Swelling    No current facility-administered medications on file prior to encounter.        Current Outpatient Prescriptions on File Prior to Encounter  Medication Sig  . amLODipine (NORVASC) 10 MG tablet Take 10 mg by mouth daily.  Marland Kitchen aspirin EC 81 MG tablet Take 81 mg by mouth daily.  . chlorthalidone (HYGROTON) 25 MG tablet TAKE 1 TABLET BY MOUTH  DAILY  . diltiazem  (DILACOR XR) 240 MG 24 hr capsule Take 240 mg by mouth daily.  Marland Kitchen glipiZIDE (GLUCOTROL XL) 10 MG 24 hr tablet Take 10 mg by mouth 2 (two) times daily.   . indomethacin (INDOCIN) 25 MG capsule Take 50 mg by mouth 2 (two) times daily with a meal.   . loratadine (CLARITIN) 10 MG tablet Take 10 mg by mouth daily.  Marland Kitchen losartan (COZAAR) 100 MG tablet Take 100 mg by mouth daily.  . metFORMIN (GLUCOPHAGE) 1000 MG tablet Take 1,000 mg by mouth 2 (two) times daily with a meal.   . nitroGLYCERIN (NITROSTAT) 0.4 MG SL tablet Place 0.4 mg under the tongue every 5 (five) minutes as needed for chest pain.  Marland Kitchen omeprazole (PRILOSEC) 40 MG capsule Take 40 mg by mouth daily.  . tamsulosin (FLOMAX) 0.4 MG CAPS capsule Take 0.4 mg by mouth daily after breakfast.  . traZODone (DESYREL) 100 MG tablet Take 100 mg by mouth at bedtime.     FAMILY HISTORY:  His indicated that his mother is deceased. He indicated that his father is deceased. He indicated that the status of his maternal grandmother is unknown. He indicated that his maternal grandfather is deceased. He indicated that his paternal grandmother is deceased. He indicated that the status of his maternal uncle is unknown. He indicated that the status of his unknown relative is unknown.    SOCIAL HISTORY: He  reports that he has never smoked. His smokeless tobacco use includes Snuff and Chew. He  reports that he does not drink alcohol or use drugs.  REVIEW OF SYSTEMS:   Difficult to obtain - pan positive, confused at times.    SUBJECTIVE:  See HPI  VITAL SIGNS: BP (!) 84/50 (BP Location: Left Arm)   Pulse 66   Temp 98 F (36.7 C) (Oral)   Resp 20   Ht 5\' 6"  (1.676 m)   Wt 89.8 kg (198 lb)   SpO2 99%   BMI 31.96 kg/m   HEMODYNAMICS:  VENTILATOR SETTINGS:  INTAKE / OUTPUT: No intake/output data recorded.  PHYSICAL EXAMINATION: General:  Adult male lying in bed in NAD HEENT: MM pink/dry, PERRL PSY: Flat Neuro: Alert, oriented,  non-focal CV: s1s2 rrr, no m/r/g PULM: even/non-labored, lungs bilaterally clear QQ:PYPP, tender RUQ, nondistended, bsx4 active  Extremities: warm/dry, no edema  Skin: no rashes or lesions   LABS:  BMET  Last Labs    Recent Labs Lab 03/13/17 1430  NA 136  K 4.0  CL 103  CO2 11*  BUN 49*  CREATININE 3.89*  GLUCOSE 184*      Electrolytes  Last Labs    Recent Labs Lab 03/13/17 1430  CALCIUM 9.6      CBC  Last Labs    Recent Labs Lab 03/13/17 1430  WBC 22.9*  HGB 14.1  HCT 41.8  PLT 298      Coag's Last Labs   No results for input(s): APTT, INR in the last 168 hours.    Sepsis Markers  Last Labs    Recent Labs Lab 03/13/17 1559  LATICACIDVEN 4.88*      ABG Last Labs   No results for input(s): PHART, PCO2ART, PO2ART in the last 168 hours.    Liver Enzymes Last Labs   No results for input(s): AST, ALT, ALKPHOS, BILITOT, ALBUMIN in the last 168 hours.    Cardiac Enzymes Last Labs   No results for input(s): TROPONINI, PROBNP in the last 168 hours.    Glucose Last Labs   No results for input(s): GLUCAP in the last 168 hours.    Imaging Ct Head Wo Contrast  Result Date: 03/13/2017 CLINICAL DATA:  Hip pain EXAM: CT HEAD WITHOUT CONTRAST TECHNIQUE: Contiguous axial images were obtained from the base of the skull through the vertex without intravenous contrast. COMPARISON:  CT 10/14/2014 FINDINGS: Brain: No evidence of acute infarction, hemorrhage, hydrocephalus, extra-axial collection or mass lesion/mass effect. Vascular: Negative for hyperdense vessel Skull: Negative Sinuses/Orbits: Negative Other: None IMPRESSION: Negative CT head Electronically Signed   By: Franchot Gallo M.D.   On: 03/13/2017 16:08   Dg Chest Port 1 View  Result Date: 03/13/2017 CLINICAL DATA:  Pt c/o left side face, left side neck and left leg numbness. Hx HTN, nonsmoker. EXAM: PORTABLE CHEST 1 VIEW COMPARISON:  Chest x-ray dated 03/31/2015.  FINDINGS: Study is hypoinspiratory with crowding of the perihilar and bibasilar bronchovascular markings. Given the low lung volumes, lungs are clear. No pleural effusion or pneumothorax seen. Heart size and mediastinal contours are within normal limits. Osseous structures about the chest are unremarkable. IMPRESSION: No active disease. No evidence of pneumonia or pulmonary edema. Low lung volumes. Electronically Signed   By: Franki Cabot M.D.   On: 03/13/2017 15:15     STUDIES:  7/18 EKG > non acute 7/18  CT head > neg CT chest/abd/pelvis >>  CULTURES: 7/19 BC >>   ANTIBIOTICS: 7/19 Zosyn >> 7/19 Vancomycin >>  SIGNIFICANT EVENTS: 7/19 Admit  LINES/TUBES: PIV  ASSESSMENT / PLAN:  PULMONARY A: No acute issues - CXR clear - 99% on RA P:   Monitor Pulmonary hygiene Imaging as below  CARDIOVASCULAR A:  Hypotension- unclear cause sepsis vs dehydration vs less likely cardiogenic (normal EKG, neg troponin x2) Chest pain Hx HTN, HLD P:  Tele monitoring Trend troponin and EKG Trend lactate Goal map >65 Holding preadmission norvasc, diltiazem, losartan  RENAL A:   AKI - baseline 0.9-1.3 Lactate acidosis  -? Metformin related with decreased PO intake P:   Finishing 30 ml/kg NS bolus now  Trend lactate/ BMP / urinary output Replace electrolytes as indicated Avoid nephrotoxic agents, ensure adequate renal perfusion Hold preadmission flomax  GASTROINTESTINAL A:   NPO Hx h.plyroi, GERD P:   PPI for SUP Npo for now Add LFTs, lipase Imaging as below  HEMATOLOGIC A:   Leukocytosis P:  Monitor See below  INFECTIOUS A:   Leukocytosis - source unclear, afebrile P:   Trend PCT Follow cultures Send UA Trend wbc/ fever curve Imaging as above- CT chest/abd/pelvis- no contrast due to AKI Empiric zosyn and vanc for now  ENDOCRINE A:   DM P:   CBG q 4 SSI Holding home metformin, glipizide  NEUROLOGIC A:   Hx schizoaffective disorder Left  face/cheek and left lateral leg tingling -head CT neg P:   Hold home trazodone, indomethacin  Consider MRI   FAMILY  - Updates: updated at bedside.    - Inter-disciplinary family meet or Palliative Care meeting due by:  03/20/17  CCT 45 mins  Kennieth Rad, ACNP Pulmonary and [place] Pager: [phone]  03/13/2017, 4:43 PM  Attending Note:  I have examined patient, reviewed labs, studies and notes. I have discussed the case with B Simpson, and I agree with the data and plans as amended above.   56 yo man, hx schizoaffective d/o, DM, HTN who has been experiencing weakness, nausea, back and chest discomfort for a few weeks. Over the last day he has had L facial and hemithorax tingling and numbness, increased abdomen, chest, back pain. In the ED he was hypotensive with WBC 22.9, had acute renal failure, an elevated lactate (on metformin) all consistent with septic shock. Source not immediately clear. He continued to have low BP despite aggressive volume resuscitation. On my evaluation he is uncomfortable, is able to interact, is oriented. He follows commands. OP clear. Lungs clear bilaterally. He is diaphoretic and tachycardic. Abdomen has generalized diffuse tenderness. Head CT scan was negative. Cultures have been drawn and empiric broad spectrum abx are ordered. I am suspicious that his sepsis is due to intra-abdominal source. Consider cholecystitis or pyelonephritis. CT scan abd and chest will be performed. Cause of his facial and right sided numbness unclear - ? Due to hypoperfusion. He may merit further neuro workup, ? MRI brain or EEG.   Independent critical care time is 45 minutes.   Baltazar Apo, MD, PhD 03/13/2017, 8:55 PM [facility] [phone] or if no answer [phone]

## 2017-03-13 NOTE — ED Notes (Signed)
Pt aware that urine sample is needed.  

## 2017-03-13 NOTE — ED Notes (Signed)
Lactic acid result shown to Dr. Ashok Cordia

## 2017-03-13 NOTE — Progress Notes (Signed)
[facility] Progress Note Patient Name: Nasim R Martinique DOB: 05-23-1961 MRN: [id number]   Date of Service  03/13/2017  HPI/Events of Note  Nausea - QTc interval = 494 milliseconds.   eICU Interventions  Will order Zofran 4 mg IV X 1 now.      Intervention Category Major Interventions: Other:  Lysle Dingwall 03/13/2017, 7:04 PM

## 2017-03-13 NOTE — ED Notes (Signed)
Pt refusing to allow RN to in and out cath. Bladder scan done earlier showing 38ml

## 2017-03-13 NOTE — ED Triage Notes (Signed)
Pt sts left leg pain and tingling; pt sts tingling in neck and chest; pt tearful

## 2017-03-13 NOTE — ED Provider Notes (Signed)
[facility] DEPT Provider Note   CSN: [id number] Arrival date & time: 03/13/17  1414     History   Chief Complaint Chief Complaint  Patient presents with  . Leg Pain  . Chest Pain    HPI Travis [name] is a 56 y.o. male.  Patient c/o tingling sensation to left face/cheek and left lateral leg for the past day.  States constant since getting up this AM. Denies specific exacerbating or alleviating factors. Pt also indicates he hasnt felt well 'for awhile'.  States intermittently taking aspirin for intermittent headaches in the past week.  Pt denies taking more frequently than recommended, but says has taken 3-4.   Pt is a very poor/difficult historian - level 5 caveat. Denies neck pain. No cough or uri c/o. States discomfort in chest earlier, none current. Denies exertional chest pain. No sob. Also c/o upper abd pain, unsure how long present. No dysuria, hematuria or gu c/o. No skin lesions/rash. No neck/back/spine pain. No focal joint pain, swelling or tenderness.    The history is provided by the patient and the spouse.  Leg Pain   Associated symptoms include numbness.  Chest Pain   Associated symptoms include headaches, leg pain and numbness. Pertinent negatives include no abdominal pain, no back pain, no cough, no fever, no shortness of breath, no vomiting and no weakness.    Past Medical History:  Diagnosis Date  . Dyslipidemia   . Fatigue    NAUSEA,BLOATING  . H. pylori infection 09/15/2006  . Hematochezia 08/30/2007  . History of nuclear stress test 04/03/2009   exercise myoview; normal pattern of perfusion; low risk scan   . Hypertension    treated  . NIDDM (non-insulin dependent diabetes mellitus) 2011   type 2  . Schizoaffective disorder ([place])   . Tobacco chew use   . Unspecified vitamin D deficiency     Patient Active Problem List   Diagnosis Date Noted  . Chest pain with moderate risk for cardiac etiology 01/13/2014  . Dyspnea 01/13/2014  . Family  history of coronary artery disease- (mother had MI 62) 01/13/2014  . Tobacco chew use   . NIDDM (non-insulin dependent diabetes mellitus)   . Dyslipidemia   . Unspecified vitamin D deficiency   . 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 ESOPHAGEAL REFLUX 09/28/2007  . DYSPHAGIA UNSPECIFIED 09/28/2007  . BLOOD IN STOOL, OCCULT 09/28/2007  . HERNIORRHAPHY, HX OF 09/28/2007    Past Surgical History:  Procedure Laterality Date  . APPENDECTOMY    . HERNIA REPAIR     RIGHT  . KNEE SURGERY     BOTH  . TRANSTHORACIC ECHOCARDIOGRAM  04/03/2009   EF=>55%; trace TR, mild pulm regurg       Home Medications    Prior to Admission medications   Medication Sig Start Date End Date Taking? Authorizing Provider  amLODipine (NORVASC) 10 MG tablet Take 10 mg by mouth daily.    [provider]  aspirin EC 81 MG tablet Take 81 mg by mouth daily.    [provider]  chlorthalidone (HYGROTON) 25 MG tablet TAKE 1 TABLET BY MOUTH  DAILY 02/17/17   Herminio Commons, MD  diltiazem (DILACOR XR) 240 MG 24 hr capsule Take 240 mg by mouth daily.    [provider]  furosemide (LASIX) 20 MG tablet Take 20 mg by mouth daily as needed for edema.     [provider]  glipiZIDE (GLUCOTROL XL) 10 MG 24 hr tablet Take  10 mg by mouth 2 (two) times daily.     [provider]  indomethacin (INDOCIN) 25 MG capsule Take 50 mg by mouth 2 (two) times daily with a meal.     [provider]  loratadine (CLARITIN) 10 MG tablet Take 10 mg by mouth daily.    [provider]  losartan (COZAAR) 100 MG tablet Take 100 mg by mouth daily.    [provider]  metFORMIN (GLUCOPHAGE) 1000 MG tablet Take 1,000 mg by mouth 2 (two) times daily with a meal.     [provider]  nitroGLYCERIN (NITROSTAT) 0.4 MG SL tablet Place 0.4 mg under the tongue every 5 (five) minutes as needed for chest pain.    [provider]  omeprazole  (PRILOSEC) 40 MG capsule Take 40 mg by mouth daily.    [provider]  tamsulosin (FLOMAX) 0.4 MG CAPS capsule Take 0.4 mg by mouth daily after breakfast.    [provider]  traZODone (DESYREL) 100 MG tablet Take 100 mg by mouth at bedtime.     [provider]    Family History Family History  Problem Relation Age of Onset  . Stroke Father 65  . Pneumonia Father   . Heart failure Mother 27  . Coronary artery disease Mother        also MI  . Heart attack Mother   . Heart attack Maternal Uncle        died in 2s  . Cancer Unknown        x2; materal side  . Diabetes Maternal Grandmother     Social History Social History  Substance Use Topics  . Smoking status: Never Smoker  . Smokeless tobacco: Current User    Types: Snuff, Chew     Comment: started chewing tobacco at age 18 years old-stopped chewing and dips snuff only  . Alcohol use No     Allergies   Doxycycline and Lisinopril   Review of Systems Review of Systems  Constitutional: Negative for chills and fever.  HENT: Negative for sore throat.   Eyes: Negative for pain and visual disturbance.  Respiratory: Negative for cough and shortness of breath.   Cardiovascular: Positive for chest pain. Negative for leg swelling.  Gastrointestinal: Negative for abdominal pain, diarrhea and vomiting.  Endocrine: Negative for polyuria.  Genitourinary: Negative for dysuria and flank pain.  Musculoskeletal: Negative for back pain, neck pain and neck stiffness.  Skin: Negative for rash.  Neurological: Positive for numbness and headaches. Negative for speech difficulty and weakness.  Hematological: Does not bruise/bleed easily.  Psychiatric/Behavioral: Negative for confusion.     Physical Exam Updated Vital Signs BP (!) 93/54 (BP Location: Right Arm)   Pulse 87   Temp 98 F (36.7 C) (Oral)   Resp 20   SpO2 98%   Physical Exam  Constitutional: He appears well-developed and well-nourished.  Pt  uncomfortable appearing. Bp soft.   HENT:  Head: Atraumatic.  Right Ear: External ear normal.  Left Ear: External ear normal.  Mouth/Throat: Oropharynx is clear and moist.  Eyes: Pupils are equal, round, and reactive to light. Conjunctivae and EOM are normal. No scleral icterus.  Neck: Neck supple. No tracheal deviation present. No thyromegaly present.  No stiffness or rigidity.  Cardiovascular: Normal rate, regular rhythm, normal heart sounds and intact distal pulses.  Exam reveals no gallop and no friction rub.   No murmur heard. Pulmonary/Chest: Effort normal and breath sounds normal. No accessory muscle usage.  No respiratory distress.  Abdominal: Soft. Bowel sounds are normal. He exhibits no distension and no mass. There is tenderness. There is no rebound and no guarding. No hernia.  Epigastric and upper abd tenderness.   Genitourinary:  Genitourinary Comments: No cva tenderness. Normal external gu exam.   Musculoskeletal: He exhibits no tenderness.  CTLS spine, non tender, aligned, no step off.  Good rom bil extremities, no focal pain, sts or tenderness. Distal pulses palp.  Mild bil ankle edema.   Lymphadenopathy:    He has no cervical adenopathy.  Neurological: He is alert. No cranial nerve deficit.  Speech clear/fluent. Motor intact bil, stre 5/5. sens grossly intact bil.  Skin: Skin is warm and dry. No rash noted. No erythema.  Psychiatric: He has a normal mood and affect.  Nursing note and vitals reviewed.    ED Treatments / Results  Labs (all labs ordered are listed, but only abnormal results are displayed)   Results for orders placed or performed during the hospital encounter of 58/09/98  Basic metabolic panel  Result Value Ref Range   Sodium 136 135 - 145 mmol/L   Potassium 4.0 3.5 - 5.1 mmol/L   Chloride 103 101 - 111 mmol/L   CO2 11 (L) 22 - 32 mmol/L   Glucose, Bld 184 (H) 65 - 99 mg/dL   BUN 49 (H) 6 - 20 mg/dL   Creatinine, Ser 3.89 (H) 0.61 - 1.24 mg/dL     Calcium 9.6 8.9 - 10.3 mg/dL   GFR calc non Af Amer 16 (L) >60 mL/min   GFR calc Af Amer 18 (L) >60 mL/min   Anion gap 22 (H) 5 - 15  CBC  Result Value Ref Range   WBC 22.9 (H) 4.0 - 10.5 K/uL   RBC 4.91 4.22 - 5.81 MIL/uL   Hemoglobin 14.1 13.0 - 17.0 g/dL   HCT 41.8 39.0 - 52.0 %   MCV 85.1 78.0 - 100.0 fL   MCH 28.7 26.0 - 34.0 pg   MCHC 33.7 30.0 - 36.0 g/dL   RDW 13.2 11.5 - 15.5 %   Platelets 298 150 - 400 K/uL  I-stat troponin, ED  Result Value Ref Range   Troponin i, poc 0.00 0.00 - 0.08 ng/mL   Comment 3          I-Stat CG4 Lactic Acid, ED  (not at  [facility])  Result Value Ref Range   Lactic Acid, Venous 4.88 (HH) 0.5 - 1.9 mmol/L   Comment NOTIFIED PHYSICIAN   I-stat troponin, ED (not at [facility])  Result Value Ref Range   Troponin i, poc 0.00 0.00 - 0.08 ng/mL   Comment 3           Ct Head Wo Contrast  Result Date: 03/13/2017 CLINICAL DATA:  Hip pain EXAM: CT HEAD WITHOUT CONTRAST TECHNIQUE: Contiguous axial images were obtained from the base of the skull through the vertex without intravenous contrast. COMPARISON:  CT 10/14/2014 FINDINGS: Brain: No evidence of acute infarction, hemorrhage, hydrocephalus, extra-axial collection or mass lesion/mass effect. Vascular: Negative for hyperdense vessel Skull: Negative Sinuses/Orbits: Negative Other: None IMPRESSION: Negative CT head Electronically Signed   By: Franchot Gallo M.D.   On: 03/13/2017 16:08   Dg Chest Port 1 View  Result Date: 03/13/2017 CLINICAL DATA:  Pt c/o left side face, left side neck and left leg numbness. Hx HTN, nonsmoker. EXAM: PORTABLE CHEST 1 VIEW COMPARISON:  Chest x-ray dated 03/31/2015. FINDINGS: Study is hypoinspiratory with crowding of the  perihilar and bibasilar bronchovascular markings. Given the low lung volumes, lungs are clear. No pleural effusion or pneumothorax seen. Heart size and mediastinal contours are within normal limits. Osseous structures about the chest are unremarkable. IMPRESSION:  No active disease. No evidence of pneumonia or pulmonary edema. Low lung volumes. Electronically Signed   By: Franki Cabot M.D.   On: 03/13/2017 15:15      EKG  EKG Interpretation  Date/Time:  Thursday March 13 2017 14:21:24 EDT Ventricular Rate:  84 PR Interval:  126 QRS Duration: 84 QT Interval:  390 QTC Calculation: 460 R Axis:   66 Text Interpretation:  Normal sinus rhythm Normal ECG No significant change since last tracing Confirmed by Lajean Saver [phone]) on 03/13/2017 2:56:28 PM       Radiology Ct Head Wo Contrast  Result Date: 03/13/2017 CLINICAL DATA:  Hip pain EXAM: CT HEAD WITHOUT CONTRAST TECHNIQUE: Contiguous axial images were obtained from the base of the skull through the vertex without intravenous contrast. COMPARISON:  CT 10/14/2014 FINDINGS: Brain: No evidence of acute infarction, hemorrhage, hydrocephalus, extra-axial collection or mass lesion/mass effect. Vascular: Negative for hyperdense vessel Skull: Negative Sinuses/Orbits: Negative Other: None IMPRESSION: Negative CT head Electronically Signed   By: Franchot Gallo M.D.   On: 03/13/2017 16:08   Dg Chest Port 1 View  Result Date: 03/13/2017 CLINICAL DATA:  Pt c/o left side face, left side neck and left leg numbness. Hx HTN, nonsmoker. EXAM: PORTABLE CHEST 1 VIEW COMPARISON:  Chest x-ray dated 03/31/2015. FINDINGS: Study is hypoinspiratory with crowding of the perihilar and bibasilar bronchovascular markings. Given the low lung volumes, lungs are clear. No pleural effusion or pneumothorax seen. Heart size and mediastinal contours are within normal limits. Osseous structures about the chest are unremarkable. IMPRESSION: No active disease. No evidence of pneumonia or pulmonary edema. Low lung volumes. Electronically Signed   By: Franki Cabot M.D.   On: 03/13/2017 15:15    Procedures Procedures (including critical care time)  Medications Ordered in ED Medications  sodium chloride 0.9 % bolus 1,000 mL (not  administered)  sodium chloride 0.9 % bolus 1,000 mL (not administered)     Initial Impression / Assessment and Plan / ED Course  I have reviewed the triage vital signs and the nursing notes.  Pertinent labs & imaging results that were available during my care of the patient were reviewed by me and considered in my medical decision making (see chart for details).  Iv ns bolus.   Labs.  From labs sent from triage, hco3 low, glucose elev, wbc elev.   Additional labs and cultures added to workup.  Additional ivf.   Recheck bp, lower, in 80's. No change in exam or new c/o from prior.   Additional iv ns bolus, total 30 cc/kg.  Cultures sent.   Source of infection unclear, however, given elev wbc 22, lactate > 4, aki, iv abx given.  Zosyn and vanc iv.   Discussed with elink/critical care, Dr Madalyn Rob - he will see pccm admitting team for admission to icu.  Additional labs and imaging studies remain pending.  CRITICAL CARE  RE elevated lactate, volume depletion, hypotension, elevated wbc, suspect severe sepsis, aki.  Performed by: Mirna Mires Total critical care time: 45 minutes Critical care time was exclusive of separately billable procedures and treating other patients. Critical care was necessary to treat or prevent imminent or life-threatening deterioration. Critical care was time spent personally by me on the following activities: development of treatment plan with patient  and/or surrogate as well as nursing, discussions with consultants, evaluation of patient's response to treatment, examination of patient, obtaining history from patient or surrogate, ordering and performing treatments and interventions, ordering and review of laboratory studies, ordering and review of radiographic studies, pulse oximetry and re-evaluation of patient's condition.   Final Clinical Impressions(s) / ED Diagnoses   Final diagnoses:  None    New Prescriptions New Prescriptions   No medications on  file     Lajean Saver, MD 03/13/17 [phone]

## 2017-03-13 NOTE — Progress Notes (Signed)
Pharmacy Antibiotic Note  Travis [name] is a 56 y.o. male admitted on 03/13/2017 with sepsis.  Pharmacy has been consulted for vancomycin and zosyn dosing. Pt is afebrile but WBC is elevated at 22.9. Lactic acid is also significantly elevated at 4.88. Pt also with AKI and Scr of 3.89.   Plan: Vancomycin 2gm IV x 1 -  F/u Scr trend for further doses Zosyn 3.375gm IV x 1 then 2.25gm IV Q6H F/u renal fxn, C&S, clinical status and trough at SS  Height: 5\' 6"  (167.6 cm) Weight: 198 lb (89.8 kg) IBW/kg (Calculated) : 63.8  Temp (24hrs), Avg:98 F (36.7 C), Min:98 F (36.7 C), Max:98 F (36.7 C)   Recent Labs Lab 03/13/17 1430 03/13/17 1559  WBC 22.9*  --   CREATININE 3.89*  --   LATICACIDVEN  --  4.88*    Estimated Creatinine Clearance: 22 mL/min (A) (by C-G formula based on SCr of 3.89 mg/dL (H)).    Allergies  Allergen Reactions  . Doxycycline Other (See Comments)    Reaction:  Blisters   . Lisinopril Swelling    Antimicrobials this admission: Vanc 719>> Zosyn 7/19>>  Dose adjustments this admission: N/A  Microbiology results: Pending  Thank you for allowing pharmacy to be a part of this patient's care.  Richetta Cubillos, Rande Lawman 03/13/2017 4:31 PM

## 2017-03-13 NOTE — Progress Notes (Signed)
[facility] Progress Note Patient Name: Travis [name] DOB: 1961/06/05 MRN: [id number]   Date of Service  03/13/2017  HPI/Events of Note  Hypotension - BP = 90/56 with MAP = 68.  eICU Interventions  Will order: 1. Bolus with 0.9 NaCl 1 liter IV over 1 hour now.  2. Will notify ground team of need for CVL and CVP measurements.      Intervention Category Major Interventions: Hypotension - evaluation and management  Lamark Schue Eugene 03/13/2017, 10:28 PM

## 2017-03-13 NOTE — ED Notes (Signed)
Report attempted x 1

## 2017-03-14 ENCOUNTER — Encounter (HOSPITAL_COMMUNITY): Payer: Self-pay | Admitting: Internal Medicine

## 2017-03-14 ENCOUNTER — Inpatient Hospital Stay (HOSPITAL_COMMUNITY): Payer: 59

## 2017-03-14 DIAGNOSIS — R200 Anesthesia of skin: Secondary | ICD-10-CM

## 2017-03-14 DIAGNOSIS — A419 Sepsis, unspecified organism: Secondary | ICD-10-CM

## 2017-03-14 DIAGNOSIS — E869 Volume depletion, unspecified: Secondary | ICD-10-CM

## 2017-03-14 DIAGNOSIS — D72829 Elevated white blood cell count, unspecified: Secondary | ICD-10-CM

## 2017-03-14 DIAGNOSIS — R7989 Other specified abnormal findings of blood chemistry: Secondary | ICD-10-CM

## 2017-03-14 LAB — CBC
HCT: 36.3 % — ABNORMAL LOW (ref 39.0–52.0)
Hemoglobin: 11.9 g/dL — ABNORMAL LOW (ref 13.0–17.0)
MCH: 28.3 pg (ref 26.0–34.0)
MCHC: 32.8 g/dL (ref 30.0–36.0)
MCV: 86.4 fL (ref 78.0–100.0)
Platelets: 276 10*3/uL (ref 150–400)
RBC: 4.2 MIL/uL — ABNORMAL LOW (ref 4.22–5.81)
RDW: 13.3 % (ref 11.5–15.5)
WBC: 19.6 10*3/uL — ABNORMAL HIGH (ref 4.0–10.5)

## 2017-03-14 LAB — CORTISOL: Cortisol, Plasma: 16.6 ug/dL

## 2017-03-14 LAB — BASIC METABOLIC PANEL
Anion gap: 8 (ref 5–15)
BUN: 39 mg/dL — ABNORMAL HIGH (ref 6–20)
CO2: 16 mmol/L — ABNORMAL LOW (ref 22–32)
Calcium: 7.6 mg/dL — ABNORMAL LOW (ref 8.9–10.3)
Chloride: 118 mmol/L — ABNORMAL HIGH (ref 101–111)
Creatinine, Ser: 2.53 mg/dL — ABNORMAL HIGH (ref 0.61–1.24)
GFR calc Af Amer: 31 mL/min — ABNORMAL LOW (ref >60)
GFR calc non Af Amer: 27 mL/min — ABNORMAL LOW (ref >60)
Glucose, Bld: 87 mg/dL (ref 65–99)
Potassium: 4.1 mmol/L (ref 3.5–5.1)
Sodium: 142 mmol/L (ref 135–145)

## 2017-03-14 LAB — GLUCOSE, CAPILLARY: Glucose-Capillary: 89 mg/dL (ref 65–99)

## 2017-03-14 LAB — HIV ANTIBODY (ROUTINE TESTING W REFLEX): HIV Screen 4th Generation wRfx: NONREACTIVE

## 2017-03-14 LAB — TROPONIN I: Troponin I: <0.03 ng/mL (ref <0.03)

## 2017-03-14 LAB — PROCALCITONIN: Procalcitonin: 0.12 ng/mL

## 2017-03-14 MED ORDER — PIPERACILLIN-TAZOBACTAM 3.375 G IVPB
3.3750 g | Freq: Three times a day (TID) | INTRAVENOUS | Status: DC
  Administered 2017-03-14 – 2017-03-15 (×2): 3.375 g via INTRAVENOUS
  Filled 2017-03-14 (×3): qty 50

## 2017-03-14 MED ORDER — SODIUM CHLORIDE 0.45 % IV SOLN: INTRAVENOUS | Status: DC

## 2017-03-14 MED ORDER — ACETAMINOPHEN 650 MG RE SUPP
650.0000 mg | Freq: Four times a day (QID) | RECTAL | Status: DC | PRN
  Administered 2017-03-14 – 2017-03-15 (×2): 650 mg via RECTAL
  Filled 2017-03-14 (×2): qty 1

## 2017-03-14 MED ORDER — SODIUM CHLORIDE 0.45 % IV SOLN
INTRAVENOUS | Status: DC
  Administered 2017-03-14 – 2017-03-15 (×2): via INTRAVENOUS
  Filled 2017-03-14 (×2): qty 100
  Filled 2017-03-14: qty 50

## 2017-03-14 NOTE — Progress Notes (Signed)
Pt transported down to MRI w/this RN and transporter. Pt now hooked up to MRI vitals machines.

## 2017-03-14 NOTE — Progress Notes (Addendum)
PULMONARY / CRITICAL CARE MEDICINE   Name: Travis [name] MRN:   [id number] DOB:   [birth year]         ADMISSION DATE:  03/13/2017 CONSULTATION DATE:  7/19  REFERRING MD:  Ashok Cordia (EDP)   CHIEF COMPLAINT:  Sepsis   HISTORY OF PRESENT ILLNESS:   56yo male with hx HTN, DM, schizoaffective disorder presented 7/19 with initial complaint of L face/cheek and L lateral leg tingling.  In ER continued to have vague c/o chest pain, back pain, abd pain, general malaise, intermittent headaches.  Pt is on metformin.  Initial w/u revealed lactate 4.8, metabolic acidosis, AKI with Scr 3.89, wbc 22.9, no clear source of infection and PCCM consult for ICU admission.     C/o back pain, chest pain, RUQ pain, nausea, mild dyspnea, constipation.  Poor po intake as well as poor urine outpt.   Denies vomiting, diarrhea, melena, syncope, dizziness.   Symptoms have been ongoing x 1-2 weeks but worse over last 24 hours.   SUBJECTIVE:  Hypotensive overnight - started IV Neo & 1L NS bolus Nauseous overnight - IV Zofran x 1 Afebrile Neo required  VITAL SIGNS: BP (!) 84/50 (BP Location: Left Arm)   Pulse 66   Temp 98 F (36.7 C) (Oral)   Resp 20   Ht _0  (1.676 m)   Wt 89.8 kg (198 lb)   SpO2 99%   BMI 31.96 kg/m   HEMODYNAMICS:  VENTILATOR SETTINGS:  INTAKE / OUTPUT: +6.6L  PHYSICAL EXAMINATION: General: left facial numb, no distress HEENT: jvd wnl PSY: awake, pleasant Neuro: left facial numbness, normal strength, perrl, no droop CV: s1 s2 RRR no r PULM: CTA GI: tender ruq, left lower abdo tender deep, low BS Extremities: no edema, no rash Skin: no rash  LABS:  BMET    Component Value Date/Time   NA 142 03/14/2017 0451   K 4.1 03/14/2017 0451   CL 118 (H) 03/14/2017 0451   CO2 16 (L) 03/14/2017 0451   GLUCOSE 87 03/14/2017 0451   BUN 39 (H) 03/14/2017 0451   CREATININE 2.53 (H) 03/14/2017 0451   CALCIUM 7.6 (L) 03/14/2017 0451   GFRNONAA 27 (L) 03/14/2017 0451    GFRAA 31 (L) 03/14/2017 0451   CBC CBC    Component Value Date/Time   WBC 19.6 (H) 03/14/2017 0451   RBC 4.20 (L) 03/14/2017 0451   HGB 11.9 (L) 03/14/2017 0451   HCT 36.3 (L) 03/14/2017 0451   PLT 276 03/14/2017 0451   MCV 86.4 03/14/2017 0451   MCV 91.6 03/24/2013 1218   MCH 28.3 03/14/2017 0451   MCHC 32.8 03/14/2017 0451   RDW 13.3 03/14/2017 0451   LYMPHSABS 2.2 04/07/2016 1752   MONOABS 1.8 (H) 04/07/2016 1752   EOSABS 0.0 04/07/2016 1752   BASOSABS 0.0 04/07/2016 1752   Coag's Last Labs   No results for input(s): APTT, INR in the last 168 hours.   Sepsis Markers  Lactic acid 4.88 --> 1.2 procalcitonin 0.12  ABG Last Labs   No results for input(s): PHART, PCO2ART, PO2ART in the last 168 hours.   Liver Enzymes Hepatic Function Latest Ref Rng & Units 03/13/2017 03/13/2017  Total Protein 6.5 - 8.1 g/dL 5.7(L) 6.8  Albumin 3.5 - 5.0 g/dL 3.2(L) 3.9  AST 15 - 41 U/L 30 34  ALT 17 - 63 U/L 33 33  Alk Phosphatase 38 - 126 U/L 36(L) 42  Total Bilirubin 0.3 - 1.2 mg/dL 0.5 0.6  Bilirubin, Direct 0.1 -  0.5 mg/dL <0.1(L) <0.1(L)   Cardiac Enzymes Troponin neg x 3  STUDIES:  7/18 EKG > non acute 7/18 CT head > neg 7/19 CT chest/abd/pelvis >>1. Coronary arteriosclerosis along the circumflex. 2. Minimal aortic atherosclerosis. 3. Hepatic steatosis. 4. Small bilateral renal cysts, some are too small to further characterize. Nonobstructing right-sided punctate renal calculus. 5. 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  CULTURES: 7/19 BC x2 >>  ANTIBIOTICS: 7/19 Zosyn >> 7/19 Vancomycin >>  SIGNIFICANT EVENTS: 7/19 Admit 7/20 neo required  LINES/TUBES: PIV  ASSESSMENT / PLAN:  PULMONARY A: No acute issues - CXR & CT chest clear - >92% on RA P: IS as able  CARDIOVASCULAR A:  Shock, hypovolemic, SIRS, r/o sepsis unclear Lactate  downtrending (2.58>1.2) Chest pain Hx HTN, HLD P:  Tele monitoring Trend lactate Goal map >65 Holding preadmission norvasc, diltiazem, losartan Continue IV Neo to map goals cortisol  RENAL A:   AKI - baseline 0.9-1.3 Cr 3.89>2.53 Lactate acidosis (2.58>1.2) -? Metformin related with decreased PO intake NONAG acidosis - diarrhea, saline admin P: S/p 1L NS bolus x1-done yesterday UOP 1.7L Trend lactate/ BMP / urinary output Replace electrolytes as indicated Avoid nephrotoxic agents, ensure adequate renal perfusion Hold preadmission flomax Avoid saline  GASTROINTESTINAL A:   NPO Hx h.plyroi, GERD LFTs nl, lipase minimally elevated R/o acute chole R./o gastroenteritis R/o bowel  Ischemia nontransmural  P:   PPI for SUP NPO for now Send GI panel  HEMATOLOGIC A:   Leukocytosis(22.9->19.6) Hgb 14.1->11.9 s/p 1L NS bolus, likely from IV fluid hemodilution P:  Monitor See below Sub q heparin    INFECTIOUS A:   Leukocytosis - WBC downtrending (22.9->19.6) - source unclear, afebrile - favor GI source - UA neg P:   Trend PCT Follow cultures Trend wbc/ fever curve CT chest/abd/pelvis with ?sclerosing mesenteritis - bowel ischemia? Empiric zosyn and vanc for now  ENDOCRINE A:   DM R/o rel AI P:   CBG q 4 SSI Holding home metformin, glipizide cortisol  NEUROLOGIC A:   Hx schizoaffective disorder Left face/cheek and left lateral leg tingling -head CT neg P: Hold home trazodone, indomethacin  Consider MRI or EEG Neuro consult  Colbert Ewing, MD Internal Medicine, PGY-1   STAFF NOTE: I, Merrie Roof, MD FACP have personally reviewed patient's available data, including medical history, events of note, physical examination and test results as part of my evaluation. I have discussed with resident/NP and other care providers such as pharmacist, RN and RRT. In addition, I personally evaluated patient and elicited key findings of: alert, oriented,  no distress, lungs clear, facial numbness left no droop, nonfocal exam otherwise, abdo tender ruq left lower to deep palpation, remains in shock, neo to MAP goals, concern is hypovolemic shock, sirs r/o sepsis, source more clear to be GI related gastroenteritis vs non transmural ischemia, the CT was done without contrast , will order Gi panel, assess [place] ruq for large gallbladder to correlate with CT, the CT does nOT show any gallbladder thickening or fluid around, keep current ABX , likely narrow in am , PCT unimpressive, assess cortisol to r/o rel AI, if pressors worsen would add empiric stress roids and florinef, maintain pos balance, now has loose stools NONAG, dc saline, may need bicarb drip if unable to correct on his own with dc of saline, repeat chem in am , continued sub q hep, cbc in am , if  declines would repeat CT abdo with oral contrast and call CCS, I updated pt and wife, avoiding cvl for now, may need Noninvasive monitor for fluid responsiveness, his facial numbness is resolving fast, concern is volume status affect - TIA ?, for neuro, get carotids, may need echo The patient is critically ill with multiple organ systems failure and requires high complexity decision making for assessment and support, frequent evaluation and titration of therapies, application of advanced monitoring technologies and extensive interpretation of multiple databases.   Critical Care Time devoted to patient care services described in this note is 35 Minutes. This time reflects time of care of this signee: Merrie Roof, MD FACP. This critical care time does not reflect procedure time, or teaching time or supervisory time of PA/NP/Med student/Med Resident etc but could involve care discussion time. Rest per NP/medical resident whose note is outlined above and that I agree with   Travis [name]. Titus Mould, MD, [place] Pgr: [facility] 03/14/2017 9:43 AM

## 2017-03-14 NOTE — Consult Note (Signed)
NEURO HOSPITALIST CONSULT NOTE   Requesting physician: Dr. Lamonte Sakai  Reason for Consult: Hand numbness  History obtained from: Patient and Chart   HPI:                                                                                                                                          Travis [name] is an 56 y.o. male with a past medical history significant for schizoaffective disorder, diabetes and hypertension who presented to [facility] with complains of tingling in the left face/cheek and lateral left leg since around 1300 on 7/19. He was subsequently found to be continually hypotensive (~80s/50s) with an elevated white count and lactic acid and was a code sepsis was called. Neurology is being consulted for the persistent sensory changes.  Today, he endorses of numbness in the left face. Further, he has occasional numbness of the lateral three fingers of the left hand that has been going on for several years. No prior diagnosis of carpel tunnel.  CT head on arrival was negative. MRI pending.  Past Medical History:  Diagnosis Date  . Dyslipidemia   . Fatigue    NAUSEA,BLOATING  . H. pylori infection 09/15/2006  . Hematochezia 08/30/2007  . History of nuclear stress test 04/03/2009   exercise myoview; normal pattern of perfusion; low risk scan   . Hypertension    treated  . NIDDM (non-insulin dependent diabetes mellitus) 2011   type 2  . Schizoaffective disorder ([place])   . Tobacco chew use   . Unspecified vitamin D deficiency     Past Surgical History:  Procedure Laterality Date  . APPENDECTOMY    . HERNIA REPAIR     RIGHT  . KNEE SURGERY     BOTH  . TRANSTHORACIC ECHOCARDIOGRAM  04/03/2009   EF=>55%; trace TR, mild pulm regurg    Family History  Problem Relation Age of Onset  . Stroke Father 32  . Pneumonia Father   . Heart failure Mother 75  . Coronary artery disease Mother        also MI  . Heart attack Mother   . Heart attack Maternal Uncle         died in 42s  . Cancer Unknown        x2; materal side  . Diabetes Maternal Grandmother     Social History:  reports that he has never smoked. His smokeless tobacco use includes Snuff and Chew. He reports that he does not drink alcohol or use drugs.  Allergies  Allergen Reactions  . Doxycycline Other (See Comments)    Reaction:  Blisters   . Lisinopril Swelling    MEDICATIONS:  Current Meds  Medication Sig  . amLODipine (NORVASC) 10 MG tablet Take 10 mg by mouth daily.  Marland Kitchen aspirin EC 81 MG tablet Take 81 mg by mouth daily.  . chlorthalidone (HYGROTON) 25 MG tablet TAKE 1 TABLET BY MOUTH  DAILY  . diltiazem (DILACOR XR) 240 MG 24 hr capsule Take 240 mg by mouth daily.  Marland Kitchen glipiZIDE (GLUCOTROL XL) 10 MG 24 hr tablet Take 10 mg by mouth 2 (two) times daily.   . indomethacin (INDOCIN) 25 MG capsule Take 50 mg by mouth 2 (two) times daily with a meal.   . loratadine (CLARITIN) 10 MG tablet Take 10 mg by mouth daily.  Marland Kitchen losartan (COZAAR) 100 MG tablet Take 100 mg by mouth daily.  . metFORMIN (GLUCOPHAGE) 1000 MG tablet Take 1,000 mg by mouth 2 (two) times daily with a meal.   . nitroGLYCERIN (NITROSTAT) 0.4 MG SL tablet Place 0.4 mg under the tongue every 5 (five) minutes as needed for chest pain.  Marland Kitchen omeprazole (PRILOSEC) 40 MG capsule Take 40 mg by mouth daily.  . tamsulosin (FLOMAX) 0.4 MG CAPS capsule Take 0.4 mg by mouth daily after breakfast.  . traZODone (DESYREL) 100 MG tablet Take 100 mg by mouth at bedtime.      Review Of Systems:                                                                                                           History obtained from the patient  General: Negative for chills, fatigue Psychological: Negative for any known behavioral disorder Ophthalmic: Negative for blurry or double vision ENT: Negative for vertigo Respiratory: Negative for  cough Cardiovascular: Negative for chest pain Gastrointestinal: Positive for diarrhea, testing for Cdiff Genito-Urinary: Negative for dysuria Musculoskeletal: Negative for joint swelling or muscular weakness Neurological: As noted in HPI Dermatological: Negative for rash or skin changes, numbness or tingling  Blood pressure 103/68, pulse 96, temperature 98.5 F (36.9 C), temperature source Oral, resp. rate (!) 22, height 5\' 6"  (1.676 m), weight 89.3 kg (196 lb 13.9 oz), SpO2 97 %.   Physical Examination:                                                                                                      General: WDWN male. Appears calm and comfortable in bed, wife at bedside HEENT:  Normocephalic, no lesions, without obvious abnormality.  Normal external eye and conjunctiva.  Normal TM's bilaterally.  Normal external ears. Normal external nose, mucus membranes and septum.  Normal pharynx. Cardiovascular: S1, S2 normal, pulses palpable throughout   Pulmonary: chest clear Abdomen: soft, non-tender Extremities: no joint  deformities, effusion, or inflammation Musculoskeletal: no joint tenderness, deformity or swelling Skin: warm and dry, no hyperpigmentation, vitiligo, or suspicious lesions  Neurological Examination:                                                                                               Mental Status: Jenesis R [name] is alert, oriented x 4, thought content appropriate.  Speech fluent without evidence of aphasia. Able to follow 3-step commands without difficulty. Cranial Nerves: II: Discs flat bilaterally; Visual fields grossly normal, pupils are equal, round, reactive to light and accommodation. III,IV, VI: Ptosis not present, extra-ocular muscle movements intact bilaterally V,VII: Smile and eyebrow raise is symmetric. Facial light touch and pinprick sensation decreased on the left in the distributions of CNV that splits midline from forehead to chin VIII: Hearing  grossly intact IX,X: Uvula and palate rise symmetrically XI: SCM and bilateral shoulder shrug strength 5/5 XII: Midline tongue extension Motor: Right :     Upper extremity   5/5   Left:     Upper extremity   5/5          Lower extremity   4/5     Lower extremity   4/5 secondary to pain Pronator drift not present     Positive Phalen's test Sensory: Pinprick and light touch intact throughout Deep Tendon Reflexes: 2+ and symmetric throughout Plantars: Mute bilaterally Cerebellar: Finger-to-nose test without evidence of dysmetria or ataxia. Heel-to-shin test executed within normal limits. Gait: Not tested   Lab Results: Basic Metabolic Panel:  Recent Labs Lab 03/13/17 1430 03/14/17 0451  NA 136 142  K 4.0 4.1  CL 103 118*  CO2 11* 16*  GLUCOSE 184* 87  BUN 49* 39*  CREATININE 3.89* 2.53*  CALCIUM 9.6 7.6*    Liver Function Tests:  Recent Labs Lab 03/13/17 1537 03/13/17 1821  AST 34 30  ALT 33 33  ALKPHOS 42 36*  BILITOT 0.6 0.5  PROT 6.8 5.7*  ALBUMIN 3.9 3.2*    Recent Labs Lab 03/13/17 1537 03/13/17 1821  LIPASE 74* 58*   No results for input(s): AMMONIA in the last 168 hours.  CBC:  Recent Labs Lab 03/13/17 1430 03/14/17 0451  WBC 22.9* 19.6*  HGB 14.1 11.9*  HCT 41.8 36.3*  MCV 85.1 86.4  PLT 298 276    Cardiac Enzymes:  Recent Labs Lab 03/13/17 1821 03/13/17 2231 03/14/17 0451  TROPONINI <0.03 <0.03 <0.03    Lipid Panel: No results for input(s): CHOL, TRIG, HDL, CHOLHDL, VLDL, LDLCALC in the last 168 hours.  CBG:  Recent Labs Lab 03/13/17 1701 03/14/17 0559  GLUCAP 101* 89    Microbiology: Results for orders placed or performed during the hospital encounter of 03/13/17  Blood Culture (routine x 2)     Status: None (Preliminary result)   Collection Time: 03/13/17  3:44 PM  Result Value Ref Range Status   Specimen Description BLOOD RIGHT FOREARM  Final   Special Requests   Final    BOTTLES DRAWN AEROBIC AND ANAEROBIC Blood  Culture adequate volume   Culture NO GROWTH < 24 HOURS  Final  Report Status PENDING  Incomplete  Blood Culture (routine x 2)     Status: None (Preliminary result)   Collection Time: 03/13/17  3:51 PM  Result Value Ref Range Status   Specimen Description BLOOD RIGHT WRIST  Final   Special Requests   Final    BOTTLES DRAWN AEROBIC AND ANAEROBIC Blood Culture adequate volume   Culture NO GROWTH < 24 HOURS  Final   Report Status PENDING  Incomplete  MRSA PCR Screening     Status: None   Collection Time: 03/13/17  8:57 PM  Result Value Ref Range Status   MRSA by PCR NEGATIVE NEGATIVE Final    Comment:        The GeneXpert MRSA Assay (FDA approved for NASAL specimens only), is one component of a comprehensive MRSA colonization surveillance program. It is not intended to diagnose MRSA infection nor to guide or monitor treatment for MRSA infections.     Coagulation Studies: No results for input(s): LABPROT, INR in the last 72 hours.  Imaging: Ct Abdomen Pelvis Wo Contrast  Result Date: 03/13/2017 CLINICAL DATA:  Chest and back pain. EXAM: CT CHEST, ABDOMEN AND PELVIS WITHOUT CONTRAST TECHNIQUE: Multidetector CT imaging of the chest, abdomen and pelvis was performed following the standard protocol without IV contrast. COMPARISON:  01/20/2014 FINDINGS: CT CHEST FINDINGS Cardiovascular: Normal size cardiac chambers with minimal coronary arteriosclerosis along the circumflex. No pericardial effusion. Minimal aortic atherosclerosis at the arch without aneurysm. Top normal caliber of the main pulmonary artery. Normal branch pattern of the great vessels. Mediastinum/Nodes: No mediastinal or hilar adenopathy. Unremarkable CT appearance of the esophagus, trachea and mainstem bronchi. No thyromegaly. Lungs/Pleura: Lungs are clear. No pleural effusion or pneumothorax. Musculoskeletal: Mild disc space narrowing of the mid to lower thoracic spine from T4 caudad through T11. No acute nor suspicious  osseous abnormalities. No chest wall abnormality noted. CT ABDOMEN PELVIS FINDINGS Hepatobiliary: Hepatic steatosis. Physiologically distended gallbladder with areas of focal fatty sparing about the gallbladder fossa. No gallstones or biliary dilatation. Pancreas: Normal Spleen: Normal Adrenals/Urinary Tract: Normal bilateral adrenal glands. Stable 14 mm water attenuating cyst off the lower pole the right kidney. Tiny exophytic too small to further characterize hypodensities are also noted of the left kidney, the largest approximately 6 mm. Punctate cortical calcification involving the interpolar cortex of the right kidney consistent with nephrolithiasis. Stomach/Bowel: Contracted stomach with normal ligament of Treitz position. No bowel obstruction or inflammation. Appendectomy by report. Vascular/Lymphatic: Aortoiliac atherosclerosis without aneurysm. 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 No lymphadenopathy. Reproductive: Normal prostate and seminal vesicles. Other: No abdominal wall hernia or abnormality. No abdominopelvic ascites. Musculoskeletal: Degenerative disc disease L4-5 with grade 1 anterolisthesis of L4 on L5. No acute nor suspicious osseous abnormalities. Sclerotic foci of the left pubic symphysis, acetabular roofs and right femoral neck consistent with bone islands. IMPRESSION: 1. Coronary arteriosclerosis along the circumflex. 2. Minimal aortic atherosclerosis. 3. Hepatic steatosis. 4. Small bilateral renal cysts, some are too small to further characterize. Nonobstructing right-sided punctate renal calculus. 5. 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 Electronically Signed   By: Ashley Royalty M.D.   On: 03/13/2017 17:46   Ct Head Wo Contrast  Result Date: 03/13/2017 CLINICAL DATA:  Hip pain EXAM: CT HEAD  WITHOUT CONTRAST TECHNIQUE: Contiguous axial images were obtained from the base of the skull through the vertex without  intravenous contrast. COMPARISON:  CT 10/14/2014 FINDINGS: Brain: No evidence of acute infarction, hemorrhage, hydrocephalus, extra-axial collection or mass lesion/mass effect. Vascular: Negative for hyperdense vessel Skull: Negative Sinuses/Orbits: Negative Other: None IMPRESSION: Negative CT head Electronically Signed   By: Franchot Gallo M.D.   On: 03/13/2017 16:08   Ct Chest Wo Contrast  Result Date: 03/13/2017 CLINICAL DATA:  Chest and back pain. EXAM: CT CHEST, ABDOMEN AND PELVIS WITHOUT CONTRAST TECHNIQUE: Multidetector CT imaging of the chest, abdomen and pelvis was performed following the standard protocol without IV contrast. COMPARISON:  01/20/2014 FINDINGS: CT CHEST FINDINGS Cardiovascular: Normal size cardiac chambers with minimal coronary arteriosclerosis along the circumflex. No pericardial effusion. Minimal aortic atherosclerosis at the arch without aneurysm. Top normal caliber of the main pulmonary artery. Normal branch pattern of the great vessels. Mediastinum/Nodes: No mediastinal or hilar adenopathy. Unremarkable CT appearance of the esophagus, trachea and mainstem bronchi. No thyromegaly. Lungs/Pleura: Lungs are clear. No pleural effusion or pneumothorax. Musculoskeletal: Mild disc space narrowing of the mid to lower thoracic spine from T4 caudad through T11. No acute nor suspicious osseous abnormalities. No chest wall abnormality noted. CT ABDOMEN PELVIS FINDINGS Hepatobiliary: Hepatic steatosis. Physiologically distended gallbladder with areas of focal fatty sparing about the gallbladder fossa. No gallstones or biliary dilatation. Pancreas: Normal Spleen: Normal Adrenals/Urinary Tract: Normal bilateral adrenal glands. Stable 14 mm water attenuating cyst off the lower pole the right kidney. Tiny exophytic too small to further characterize hypodensities are also noted of  the left kidney, the largest approximately 6 mm. Punctate cortical calcification involving the interpolar cortex of the right kidney consistent with nephrolithiasis. Stomach/Bowel: Contracted stomach with normal ligament of Treitz position. No bowel obstruction or inflammation. Appendectomy by report. Vascular/Lymphatic: Aortoiliac atherosclerosis without aneurysm. 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 No lymphadenopathy. Reproductive: Normal prostate and seminal vesicles. Other: No abdominal wall hernia or abnormality. No abdominopelvic ascites. Musculoskeletal: Degenerative disc disease L4-5 with grade 1 anterolisthesis of L4 on L5. No acute nor suspicious osseous abnormalities. Sclerotic foci of the left pubic symphysis, acetabular roofs and right femoral neck consistent with bone islands. IMPRESSION: 1. Coronary arteriosclerosis along the circumflex. 2. Minimal aortic atherosclerosis. 3. Hepatic steatosis. 4. Small bilateral renal cysts, some are too small to further characterize. Nonobstructing right-sided punctate renal calculus. 5. 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 Electronically Signed   By: Ashley Royalty M.D.   On: 03/13/2017 17:46   Dg Chest Port 1 View  Result Date: 03/13/2017 CLINICAL DATA:  Pt c/o left side face, left side neck and left leg numbness. Hx HTN, nonsmoker. EXAM: PORTABLE CHEST 1 VIEW COMPARISON:  Chest x-ray dated 03/31/2015. FINDINGS: Study is hypoinspiratory with crowding of the perihilar and bibasilar bronchovascular markings. Given the low lung volumes, lungs are clear. No pleural effusion or pneumothorax seen. Heart size and mediastinal contours are within normal limits. Osseous structures about the chest are unremarkable. IMPRESSION: No active disease. No evidence of pneumonia or  pulmonary edema. Low lung volumes. Electronically Signed   By: Franki Cabot M.D.   On: 03/13/2017 15:15   US Abdomen Limited Ruq  Result Date: 03/14/2017 CLINICAL DATA:  Abdominal pain EXAM: ULTRASOUND ABDOMEN LIMITED RIGHT UPPER QUADRANT COMPARISON:  CT abdomen 03/13/2017 FINDINGS: Gallbladder: No gallstones or wall thickening visualized. No sonographic Murphy sign noted by sonographer. Common bile duct: Diameter: 5 mm Liver: Diffusely echogenic  liver without focal lesion IMPRESSION: Negative for gallstones.  Fatty infiltration throughout the liver. Electronically Signed   By: Franchot Gallo M.D.   On: 03/14/2017 14:34    Thank you for consulting the [facility] team. Assessment and plan per attending neurologist.   Solon Augusta PA-C [facility]  03/14/2017, 3:45 PM  I have seen the patient reviewed the above note. On my exam, he did have difficulty with month(June), but of the year correctly.  Assessment and Plan: 56 year old male with left-sided paresthesias following hypotensive episode. He does split the midline which can be suggestive of a nonphysiological finding, but I would favor getting an MRI brain as he certainly is at risk for stroke. If this is negative, then I would not favor further inpatient workup but would have him follow-up for possible EMG/NCS if his symptoms persist.  1) MRI brain, MRA head 2) carotid Dopplers 3) neurology will continue to follow  Roland Rack, MD [facility] [phone]  If 7pm- 7am, please page neurology on call as listed in [place].

## 2017-03-14 NOTE — Progress Notes (Signed)
Pharmacy Antibiotic Note  Travis [name] is a 56 y.o. male admitted on 03/13/2017 with sepsis.  Pharmacy has been consulted for vancomycin and zosyn dosing. Pt is afebrile but WBC is elevated at 22.9> improve 19. Lactic acid was elevated at 4.88> improved 1.3, PCT < 0.2. Pt also with AKI and Scr of 3.89 > improved 2.5  Plan: Vancomycin 2gm IV x 1 given yesterday - check Vancomycin random level in am and redose Zosyn 3.375gm IV x 1 then 2.25gm IV Q6H> change to EI doing with improved renal fx   Height: 5\' 6"  (167.6 cm) Weight: 196 lb 13.9 oz (89.3 kg) IBW/kg (Calculated) : 63.8  Temp (24hrs), Avg:98.5 F (36.9 C), Min:98 F (36.7 C), Max:98.8 F (37.1 C)   Recent Labs Lab 03/13/17 1430 03/13/17 1559 03/13/17 1821 03/13/17 1827 03/13/17 2231 03/14/17 0451  WBC 22.9*  --   --   --   --  19.6*  CREATININE 3.89*  --   --   --   --  2.53*  LATICACIDVEN  --  4.88* 2.4* 2.58* 1.2  --     Estimated Creatinine Clearance: 33.7 mL/min (A) (by C-G formula based on SCr of 2.53 mg/dL (H)).    Allergies  Allergen Reactions  . Doxycycline Other (See Comments)    Reaction:  Blisters   . Lisinopril Swelling    Antimicrobials this admission: Vanc 719>> Zosyn 7/19>>  Dose adjustments this admission: Zosyn 2.25gm q6> EI  Microbiology results: UC clear,  7/19 BCx ngtd MRSA  neg   Bonnita Nasuti Pharm.D. CPP, BCPS Clinical Pharmacist [phone] 03/14/2017 2:04 PM

## 2017-03-14 NOTE — Progress Notes (Signed)
RT NOTE:  RT attempted arterial placement x2 without success. Good blood flow each attempt, however, catheter could not be threaded into artery. RN aware. MD aware.

## 2017-03-14 NOTE — Progress Notes (Signed)
[facility] called, awaiting MD callback to notify of pt's fever and mild headache.

## 2017-03-14 NOTE — Progress Notes (Signed)
[facility] Progress Note Patient Name: Travis [name] DOB: 06-03-1961 MRN: [id number]   Date of Service  03/14/2017  HPI/Events of Note  Patient c/o headache - AST and ALT are normal. NPO.   eICU Interventions  Will order: 1. Tylenol Suppository 650 mg PR Q 6 hours PRN pain.      Intervention Category Intermediate Interventions: Pain - evaluation and management  Jacquiline Zurcher Cornelia Copa 03/14/2017, 8:57 PM

## 2017-03-15 ENCOUNTER — Inpatient Hospital Stay (HOSPITAL_COMMUNITY): Payer: 59

## 2017-03-15 ENCOUNTER — Encounter (HOSPITAL_COMMUNITY): Payer: 59

## 2017-03-15 DIAGNOSIS — E872 Acidosis, unspecified: Secondary | ICD-10-CM

## 2017-03-15 DIAGNOSIS — N179 Acute kidney failure, unspecified: Secondary | ICD-10-CM

## 2017-03-15 LAB — COMPREHENSIVE METABOLIC PANEL
ALT: 29 U/L (ref 17–63)
AST: 23 U/L (ref 15–41)
Albumin: 3.1 g/dL — ABNORMAL LOW (ref 3.5–5.0)
Alkaline Phosphatase: 42 U/L (ref 38–126)
Anion gap: 8 (ref 5–15)
BUN: 18 mg/dL (ref 6–20)
CO2: 22 mmol/L (ref 22–32)
Calcium: 8.4 mg/dL — ABNORMAL LOW (ref 8.9–10.3)
Chloride: 110 mmol/L (ref 101–111)
Creatinine, Ser: 1.5 mg/dL — ABNORMAL HIGH (ref 0.61–1.24)
GFR calc Af Amer: 58 mL/min — ABNORMAL LOW (ref >60)
GFR calc non Af Amer: 50 mL/min — ABNORMAL LOW (ref >60)
Glucose, Bld: 70 mg/dL (ref 65–99)
Potassium: 3.4 mmol/L — ABNORMAL LOW (ref 3.5–5.1)
Sodium: 140 mmol/L (ref 135–145)
Total Bilirubin: 1.1 mg/dL (ref 0.3–1.2)
Total Protein: 5.8 g/dL — ABNORMAL LOW (ref 6.5–8.1)

## 2017-03-15 LAB — GASTROINTESTINAL PANEL BY PCR, STOOL (REPLACES STOOL CULTURE)

## 2017-03-15 LAB — GLUCOSE, CAPILLARY
Glucose-Capillary: 92 mg/dL (ref 65–99)
Glucose-Capillary: 125 mg/dL — ABNORMAL HIGH (ref 65–99)

## 2017-03-15 LAB — CBC WITH DIFFERENTIAL/PLATELET
Basophils Absolute: 0 10*3/uL (ref 0.0–0.1)
Basophils Relative: 0 %
Eosinophils Absolute: 0.4 10*3/uL (ref 0.0–0.7)
Eosinophils Relative: 4 %
HCT: 35.2 % — ABNORMAL LOW (ref 39.0–52.0)
Hemoglobin: 11.5 g/dL — ABNORMAL LOW (ref 13.0–17.0)
Lymphocytes Relative: 32 %
Lymphs Abs: 3.2 10*3/uL (ref 0.7–4.0)
MCH: 28.3 pg (ref 26.0–34.0)
MCHC: 32.7 g/dL (ref 30.0–36.0)
MCV: 86.7 fL (ref 78.0–100.0)
Monocytes Absolute: 0.8 10*3/uL (ref 0.1–1.0)
Monocytes Relative: 8 %
Neutro Abs: 5.5 10*3/uL (ref 1.7–7.7)
Neutrophils Relative %: 56 %
Platelets: 166 10*3/uL (ref 150–400)
RBC: 4.06 MIL/uL — ABNORMAL LOW (ref 4.22–5.81)
RDW: 13.3 % (ref 11.5–15.5)
WBC: 9.9 10*3/uL (ref 4.0–10.5)

## 2017-03-15 LAB — PROCALCITONIN: Procalcitonin: 0.1 ng/mL

## 2017-03-15 LAB — VANCOMYCIN, RANDOM: Vancomycin Rm: 7

## 2017-03-15 MED ORDER — ONDANSETRON HCL 4 MG/2ML IJ SOLN
4.0000 mg | Freq: Three times a day (TID) | INTRAMUSCULAR | Status: DC | PRN
  Administered 2017-03-15 – 2017-03-17 (×4): 4 mg via INTRAVENOUS
  Filled 2017-03-15 (×5): qty 2

## 2017-03-15 MED ORDER — ACETAMINOPHEN 325 MG PO TABS
650.0000 mg | ORAL_TABLET | Freq: Four times a day (QID) | ORAL | Status: DC | PRN
  Administered 2017-03-15 – 2017-03-17 (×4): 650 mg via ORAL
  Filled 2017-03-15 (×4): qty 2

## 2017-03-15 MED ORDER — POTASSIUM CHLORIDE 10 MEQ/100ML IV SOLN: 10.0000 meq | INTRAVENOUS | Status: DC

## 2017-03-15 MED ORDER — POTASSIUM CHLORIDE 10 MEQ/100ML IV SOLN
10.0000 meq | INTRAVENOUS | Status: AC
  Administered 2017-03-15 (×4): 10 meq via INTRAVENOUS
  Filled 2017-03-15 (×4): qty 100

## 2017-03-15 NOTE — Progress Notes (Signed)
Dr. Jimmy Footman w/[facility] notified of K=3.4 w/AM BMet. Awaiting replacement orders.

## 2017-03-15 NOTE — Progress Notes (Signed)
[facility] Progress Note Patient Name: Travis [name] DOB: 26-Jul-1961 MRN: [id number]   Date of Service  03/15/2017  HPI/Events of Note  hypokalemia  eICU Interventions  Potassium replaced     Intervention Category Minor Interventions: Electrolytes abnormality - evaluation and management  Laylani Pudwill 03/15/2017, 2:29 AM

## 2017-03-15 NOTE — Progress Notes (Signed)
PULMONARY / CRITICAL CARE MEDICINE   Name: Travis [name] MRN:   [id number] DOB:   1961/07/14         ADMISSION DATE:  03/13/2017 CONSULTATION DATE:  7/19  REFERRING MD:  Ashok Cordia (EDP)   CHIEF COMPLAINT:  Sepsis   HISTORY OF PRESENT ILLNESS:   56yo male with hx HTN, DM, schizoaffective disorder presented 7/19 with initial complaint of L face/cheek and L lateral leg tingling.  In ER continued to have vague c/o chest pain, back pain, abd pain, general malaise, intermittent headaches.  Pt is on metformin.  Initial w/u revealed lactate 4.8, metabolic acidosis, AKI with Scr 3.89, wbc 22.9, no clear source of infection and PCCM consult for ICU admission.     C/o back pain, chest pain, RUQ pain, nausea, mild dyspnea, constipation.  Poor po intake as well as poor urine outpt.   Denies vomiting, diarrhea, melena, syncope, dizziness.   Symptoms have been ongoing x 1-2 weeks but worse over last 24 hours.   SUBJECTIVE:  Off pressors yesterday. Facial paresthesia improved. Some fevers overnight and nausea in this AM. Overall feeling better.   VITAL SIGNS: BP (!) 84/50 (BP Location: Left Arm)   Pulse 66   Temp 98 F (36.7 C) (Oral)   Resp 20   Ht 5' 6"  (1.676 m)   Wt 89.8 kg (198 lb)   SpO2 99%   BMI 31.96 kg/m   HEMODYNAMICS:  VENTILATOR SETTINGS:  INTAKE / OUTPUT:  Intake/Output Summary (Last 24 hours) at 03/15/17 0928 Last data filed at 03/15/17 0800  Gross per 24 hour  Intake          1924.38 ml  Output             3310 ml  Net         -1385.62 ml     PHYSICAL EXAMINATION: General: middle aged male in no distress HEENT: [place]/AT, PERRL, no JVD Neuro: awake alert oriented CV: RRR, no MRG PULM: clear non-labored GI: tender ruq, soft, non-distended, hypoactive BS Extremities: no edema, no rash Skin: no rash  LABS:  BMET    Component Value Date/Time   NA 140 03/15/2017 0105   K 3.4 (L) 03/15/2017 0105   CL 110 03/15/2017 0105   CO2 22 03/15/2017 0105   GLUCOSE 70 03/15/2017 0105   BUN 18 03/15/2017 0105   CREATININE 1.50 (H) 03/15/2017 0105   CALCIUM 8.4 (L) 03/15/2017 0105   GFRNONAA 50 (L) 03/15/2017 0105   GFRAA 58 (L) 03/15/2017 0105   CBC CBC    Component Value Date/Time   WBC 9.9 03/15/2017 0105   RBC 4.06 (L) 03/15/2017 0105   HGB 11.5 (L) 03/15/2017 0105   HCT 35.2 (L) 03/15/2017 0105   PLT 166 03/15/2017 0105   MCV 86.7 03/15/2017 0105   MCV 91.6 03/24/2013 1218   MCH 28.3 03/15/2017 0105   MCHC 32.7 03/15/2017 0105   RDW 13.3 03/15/2017 0105   LYMPHSABS 3.2 03/15/2017 0105   MONOABS 0.8 03/15/2017 0105   EOSABS 0.4 03/15/2017 0105   BASOSABS 0.0 03/15/2017 0105   Coag's Last Labs   No results for input(s): APTT, INR in the last 168 hours.   Sepsis Markers  Lactic acid 4.88 --> 1.2 procalcitonin 0.12  ABG Last Labs   No results for input(s): PHART, PCO2ART, PO2ART in the last 168 hours.   Liver Enzymes Hepatic Function Latest Ref Rng & Units 03/15/2017 03/13/2017 03/13/2017  Total Protein 6.5 - 8.1 g/dL 5.8(L)  5.7(L) 6.8  Albumin 3.5 - 5.0 g/dL 3.1(L) 3.2(L) 3.9  AST 15 - 41 U/L 23 30 34  ALT 17 - 63 U/L 29 33 33  Alk Phosphatase 38 - 126 U/L 42 36(L) 42  Total Bilirubin 0.3 - 1.2 mg/dL 1.1 0.5 0.6  Bilirubin, Direct 0.1 - 0.5 mg/dL - <0.1(L) <0.1(L)   Cardiac Enzymes Troponin neg x 3  STUDIES:  7/18 EKG > non acute 7/18 CT head > neg 7/19 CT chest/abd/pelvis >>1. Coronary arteriosclerosis along the circumflex. 2. Minimal aortic atherosclerosis. 3. Hepatic steatosis. 4. Small bilateral renal cysts, some are too small to further characterize. Nonobstructing right-sided punctate renal calculus. 5. 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 [place] RUQ 7/20 > Fatty liver  CULTURES: 7/19 BC x2 >>  ANTIBIOTICS: 7/19 Zosyn >> 7/19 Vancomycin >> 7/21  SIGNIFICANT EVENTS: 7/19 Admit 7/20  neo required  LINES/TUBES: PIV  ASSESSMENT / PLAN:  PULMONARY A: No acute issues P: IS as able  CARDIOVASCULAR A:  Shock, hypovolemic, SIRS, r/o sepsis unclear. Lactic cleared Chest pain Hx HTN, HLD P:  Tele monitoring Goal map >65, now off neo Holding preadmission norvasc, diltiazem, losartan  RENAL A:   AKI - baseline 0.9-1.3 (improving) AG Metabolic acidosis. Lactic cleared. Question metformin involvement.  NONAG acidosis - diarrhea, saline admin P: Replace electrolytes as indicated Avoid nephrotoxic agents, ensure adequate renal perfusion Avoid saline Hold preadmission flomax  GASTROINTESTINAL A:   Concern intraabdominal infection R/o acute chole, gastroenteritis, bowel ischemia (nontransmural) Hx h.plyroi, GERD  P:   PPI for SUP Clear liquids GI panel pending  HEMATOLOGIC A:   Leukocytosis(22.9->19.6 > 9.9) Hgb 14.1->11.9 likely hemodilution from mulitple saline bolus P:  Monitor Sub q heparin    INFECTIOUS A:   Likely sepsis - source unclear. Presumed intraabdominal.  CT chest/abd/pelvis with ?sclerosing mesenteritis - bowel ischemia?  P:   Trend PCT Follow cultures Trend wbc/ fever curve Narrow to Zosyn, dc vanco.   ENDOCRINE A:   DM R/o rel AI (cortisol 16 random) P:   CBG q 4 SSI Holding home metformin, glipizide  NEUROLOGIC A:   Hx schizoaffective disorder Left face/cheek and left lateral leg tingling (CT and MRI non-acute)  P: Hold home trazodone, indomethacin  Neuro signed off, no clear cause of paresthesia, which is improving.   Will transfer to Telemetry and ask TRH to assume care in AM.   Georgann Housekeeper, AGACNP-BC [facility] Pulmonology/Critical Care Pager [phone] or [phone]  03/15/2017 9:36 AM

## 2017-03-15 NOTE — Progress Notes (Signed)
[place] notified of pt's c/o nausea. Awaiting orders.

## 2017-03-15 NOTE — Progress Notes (Signed)
Subjective: He reports improvement in his left paresthesia, though still present to some degree.   Exam: Vitals:   03/15/17 0700 03/15/17 0800  BP: 96/61 115/71  Pulse: (!) 103 100  Resp: 20   Temp:  99.8 F (37.7 C)   Gen: In bed, NAD Resp: non-labored breathing, no acute distress Abd: soft, nt  Neuro: MS: awake, alert, interactive and appropriate IB:BCWUG, face symmetric, reports mild allodynia on the elft.  Motor: 5/5 thorughout  Pertinent Labs: Improving Cr  Impression: 56 yo M with left paresthesia. At this time, with negative MRI and improving symptoms, I think that further workup is unlikley to be high yield. Of note, he does split the midline on his forehead. If he continues to have problems, could consider EMG as outpatient.   Recommendations: 1) No further recommendations at this time. Please call with further questions or concerns.   Roland Rack, MD [facility] [phone]  If 7pm- 7am, please page neurology on call as listed in [place].

## 2017-03-15 NOTE — Progress Notes (Signed)
Patient transferred to floor via wheelchair with family member. Telemetry applied as ordered. Patient comfortable in bed with call bell in place. Patient oriented. Will monitor.  Sheliah Plane RN

## 2017-03-16 ENCOUNTER — Inpatient Hospital Stay (HOSPITAL_COMMUNITY): Payer: 59

## 2017-03-16 DIAGNOSIS — K5909 Other constipation: Secondary | ICD-10-CM

## 2017-03-16 DIAGNOSIS — R200 Anesthesia of skin: Secondary | ICD-10-CM

## 2017-03-16 LAB — GLUCOSE, CAPILLARY
Glucose-Capillary: 100 mg/dL — ABNORMAL HIGH (ref 65–99)
Glucose-Capillary: 93 mg/dL (ref 65–99)
Glucose-Capillary: 92 mg/dL (ref 65–99)
Glucose-Capillary: 132 mg/dL — ABNORMAL HIGH (ref 65–99)

## 2017-03-16 MED ORDER — METOCLOPRAMIDE HCL 5 MG/ML IJ SOLN
5.0000 mg | Freq: Three times a day (TID) | INTRAMUSCULAR | Status: DC
  Administered 2017-03-16 – 2017-03-18 (×7): 5 mg via INTRAVENOUS
  Filled 2017-03-16 (×7): qty 2

## 2017-03-16 MED ORDER — AMOXICILLIN-POT CLAVULANATE 875-125 MG PO TABS
1.0000 | ORAL_TABLET | Freq: Two times a day (BID) | ORAL | Status: DC
  Administered 2017-03-16 – 2017-03-18 (×4): 1 via ORAL
  Filled 2017-03-16 (×4): qty 1

## 2017-03-16 NOTE — Progress Notes (Signed)
PROGRESS NOTE    Travis [name]  TZG:[id number] DOB: 10/09/1960 DOA: 03/13/2017 PCP: Redmond School, MD    Brief Narrative:  See dictated H and P by Critical Care dated 7/19 for details. Briefly, 56yo male with hx of HTN, DM, schizoaffective d/o presented to the ED with L face/cheek and L lateral leg tingling. Patient wa found to have lactate of 4.8 with metabolic acidosis and arf with WBC of 22.9K. Patient was admitted to the critical care service for further work up.  Assessment & Plan:   Active Problems:   Hypotension   Left sided numbness   Sepsis (HCC)   Elevated lactic acid level   Leukocytosis   Volume depletion   AKI (acute kidney injury) ([place])   Lactic acid acidosis   1. Sepsis with unknown source present on admission 1. Uncertain etiology noted 2. Sepsis physiology resolved 3. Patient initially on vanc and zosyn, narrowed to zosyn monotherapy 4. Will transition to PO augmentin 5. Blood cx neg 2. Abd pain 1. Complains of generalized pain 2. Presenting CT reviewed. Patient with stool throughout the colon, most notably in the ascending colon per my own read. 3. Patient reports multiple large amounts of stool on 7/19-7/20 4. Have ordered and reviewed abd xray. Normal bowel gas pattern noted. 5. Given hx of diabetes, question underlying gastroparesis.  6. Have ordered scheduled reglan 3. Constipation 1. Noted on presenting CT abd/pelvis 2. Normal bowel gas pattern seen on follow up abd xray 4. ARF 1. Suspect secondary to dehydration/pre-renal causes 2. Improved with IVF hydration 5. Dehydration 1. Improved with hydration 6. Lactic acidosis 1. Resolved 7. L sided numbness 1. Seen by Neurology 2. MRI neg for acute process 3. No further w/u per Neurology recommendations. If symptoms persist, then Neurology recommends outpatient EMG study. 8. Hypotension 1. Improved 2. BP meds remain on hold  DVT prophylaxis: Heparin subQ Code Status: Full Family  Communication: Pt in room, family at bedside Disposition Plan: Uncertain at this time  Consultants:   Critical Care  Procedures:     Antimicrobials: Anti-infectives    Start     Dose/Rate Route Frequency Ordered Stop   03/14/17 2200  piperacillin-tazobactam (ZOSYN) IVPB 3.375 g  Status:  Discontinued     3.375 g 12.5 mL/hr over 240 Minutes Intravenous Every 8 hours 03/14/17 1401 03/15/17 0802   03/13/17 2230  piperacillin-tazobactam (ZOSYN) IVPB 2.25 g  Status:  Discontinued     2.25 g 100 mL/hr over 30 Minutes Intravenous Every 6 hours 03/13/17 1631 03/14/17 1401   03/13/17 1600  piperacillin-tazobactam (ZOSYN) IVPB 3.375 g     3.375 g 100 mL/hr over 30 Minutes Intravenous  Once 03/13/17 1556 03/13/17 1653   03/13/17 1600  vancomycin (VANCOCIN) IVPB 1000 mg/200 mL premix  Status:  Discontinued     1,000 mg 200 mL/hr over 60 Minutes Intravenous  Once 03/13/17 1556 03/13/17 1557   03/13/17 1600  vancomycin (VANCOCIN) 2,000 mg in sodium chloride 0.9 % 500 mL IVPB     2,000 mg 250 mL/hr over 120 Minutes Intravenous  Once 03/13/17 1557 03/13/17 1847       Subjective: Complains of generalized abd pain and nausea  Objective: Vitals:   03/16/17 0425 03/16/17 0500 03/16/17 0802 03/16/17 1747  BP: 117/76  112/64 120/77  Pulse: 90  81 85  Resp: 19  20 20   Temp: 99.1 F (37.3 C)  98.9 F (37.2 C) 98.7 F (37.1 C)  TempSrc: Oral  Oral Oral  SpO2: 96%  96% 96%  Weight:  86.7 kg (191 lb 2.2 oz)    Height:        Intake/Output Summary (Last 24 hours) at 03/16/17 1813 Last data filed at 03/16/17 1740  Gross per 24 hour  Intake              820 ml  Output                0 ml  Net              820 ml   Filed Weights   03/15/17 0500 03/15/17 2108 03/16/17 0500  Weight: 86.5 kg (190 lb 11.2 oz) 86.7 kg (191 lb 1.6 oz) 86.7 kg (191 lb 2.2 oz)    Examination:  General exam: Appears calm and comfortable  Respiratory system: Clear to auscultation. Respiratory effort  normal. Cardiovascular system: S1 & S2 heard, RRR.  Gastrointestinal system: decreased BS, generally tender, mildly distended Central nervous system: Alert and oriented. No focal neurological deficits. Extremities: Symmetric 5 x 5 power. Skin: No rashes, lesions  Psychiatry: Judgement and insight appear normal. Mood & affect appropriate.   Data Reviewed: I have personally reviewed following labs and imaging studies  CBC:  Recent Labs Lab 03/13/17 1430 03/14/17 0451 03/15/17 0105  WBC 22.9* 19.6* 9.9  NEUTROABS  --   --  5.5  HGB 14.1 11.9* 11.5*  HCT 41.8 36.3* 35.2*  MCV 85.1 86.4 86.7  PLT 298 276 443   Basic Metabolic Panel:  Recent Labs Lab 03/13/17 1430 03/14/17 0451 03/15/17 0105  NA 136 142 140  K 4.0 4.1 3.4*  CL 103 118* 110  CO2 11* 16* 22  GLUCOSE 184* 87 70  BUN 49* 39* 18  CREATININE 3.89* 2.53* 1.50*  CALCIUM 9.6 7.6* 8.4*   GFR: Estimated Creatinine Clearance: 56.1 mL/min (A) (by C-G formula based on SCr of 1.5 mg/dL (H)). Liver Function Tests:  Recent Labs Lab 03/13/17 1537 03/13/17 1821 03/15/17 0105  AST 34 30 23  ALT 33 33 29  ALKPHOS 42 36* 42  BILITOT 0.6 0.5 1.1  PROT 6.8 5.7* 5.8*  ALBUMIN 3.9 3.2* 3.1*    Recent Labs Lab 03/13/17 1537 03/13/17 1821  LIPASE 74* 58*   No results for input(s): AMMONIA in the last 168 hours. Coagulation Profile: No results for input(s): INR, PROTIME in the last 168 hours. Cardiac Enzymes:  Recent Labs Lab 03/13/17 1821 03/13/17 2231 03/14/17 0451  TROPONINI <0.03 <0.03 <0.03   BNP (last 3 results) No results for input(s): PROBNP in the last 8760 hours. HbA1C: No results for input(s): HGBA1C in the last 72 hours. CBG:  Recent Labs Lab 03/15/17 1155 03/15/17 2107 03/16/17 0738 03/16/17 1230 03/16/17 1610  GLUCAP 92 125* 100* 93 92   Lipid Profile: No results for input(s): CHOL, HDL, LDLCALC, TRIG, CHOLHDL, LDLDIRECT in the last 72 hours. Thyroid Function Tests: No results  for input(s): TSH, T4TOTAL, FREET4, T3FREE, THYROIDAB in the last 72 hours. Anemia Panel: No results for input(s): VITAMINB12, FOLATE, FERRITIN, TIBC, IRON, RETICCTPCT in the last 72 hours. Sepsis Labs:  Recent Labs Lab 03/13/17 1559 03/13/17 1821 03/13/17 1827 03/13/17 2231 03/14/17 0451 03/15/17 0105  PROCALCITON  --  0.13  --   --  0.12 0.10  LATICACIDVEN 4.88* 2.4* 2.58* 1.2  --   --     Recent Results (from the past 240 hour(s))  Blood Culture (routine x 2)     Status: None (Preliminary result)   Collection  Time: 03/13/17  3:44 PM  Result Value Ref Range Status   Specimen Description BLOOD RIGHT FOREARM  Final   Special Requests   Final    BOTTLES DRAWN AEROBIC AND ANAEROBIC Blood Culture adequate volume   Culture NO GROWTH 3 DAYS  Final   Report Status PENDING  Incomplete  Blood Culture (routine x 2)     Status: None (Preliminary result)   Collection Time: 03/13/17  3:51 PM  Result Value Ref Range Status   Specimen Description BLOOD RIGHT WRIST  Final   Special Requests   Final    BOTTLES DRAWN AEROBIC AND ANAEROBIC Blood Culture adequate volume   Culture NO GROWTH 3 DAYS  Final   Report Status PENDING  Incomplete  MRSA PCR Screening     Status: None   Collection Time: 03/13/17  8:57 PM  Result Value Ref Range Status   MRSA by PCR NEGATIVE NEGATIVE Final    Comment:        The GeneXpert MRSA Assay (FDA approved for NASAL specimens only), is one component of a comprehensive MRSA colonization surveillance program. It is not intended to diagnose MRSA infection nor to guide or monitor treatment for MRSA infections.   Gastrointestinal Panel by PCR , Stool     Status: None   Collection Time: 03/14/17  7:29 PM  Result Value Ref Range Status   Campylobacter species NOT DETECTED NOT DETECTED Final   Plesimonas shigelloides NOT DETECTED NOT DETECTED Final   Salmonella species NOT DETECTED NOT DETECTED Final   Yersinia enterocolitica NOT DETECTED NOT DETECTED Final    Vibrio species NOT DETECTED NOT DETECTED Final   Vibrio cholerae NOT DETECTED NOT DETECTED Final   Enteroaggregative E coli (EAEC) NOT DETECTED NOT DETECTED Final   Enteropathogenic E coli (EPEC) NOT DETECTED NOT DETECTED Final   Enterotoxigenic E coli (ETEC) NOT DETECTED NOT DETECTED Final   Shiga like toxin producing E coli (STEC) NOT DETECTED NOT DETECTED Final   Shigella/Enteroinvasive E coli (EIEC) NOT DETECTED NOT DETECTED Final   Cryptosporidium NOT DETECTED NOT DETECTED Final   Cyclospora cayetanensis NOT DETECTED NOT DETECTED Final   Entamoeba histolytica NOT DETECTED NOT DETECTED Final   Giardia lamblia NOT DETECTED NOT DETECTED Final   Adenovirus F40/41 NOT DETECTED NOT DETECTED Final   Astrovirus NOT DETECTED NOT DETECTED Final   Norovirus GI/GII NOT DETECTED NOT DETECTED Final   Rotavirus A NOT DETECTED NOT DETECTED Final   Sapovirus (I, II, IV, and V) NOT DETECTED NOT DETECTED Final     Radiology Studies: Mr Virgel Paling SH Contrast  Result Date: 03/14/2017 CLINICAL DATA:  56 y/o M; history of schizoaffective disorder. Initial complaint of left face/ cheek and left lateral leg tingling. EXAM: MRI HEAD WITHOUT CONTRAST MRA HEAD WITHOUT CONTRAST TECHNIQUE: Multiplanar, multiecho pulse sequences of the brain and surrounding structures were obtained without intravenous contrast. Angiographic images of the head were obtained using MRA technique without contrast. COMPARISON:  03/13/2017 CT head FINDINGS: MRI HEAD FINDINGS Brain: No acute infarction, hemorrhage, hydrocephalus, extra-axial collection or mass lesion. Several nonspecific scattered foci of T2 FLAIR hyperintense signal abnormality are present in subcortical and periventricular white matter. No lesion is present within corpus callosum, brainstem, or cerebellum. Tiny T2 hyperintense foci are present in the left putamen and left thalamus. Mild brain parenchymal volume loss. Vascular: As below. Skull and upper cervical spine:  Normal marrow signal. Sinuses/Orbits: Negative. Other: None. MRA HEAD FINDINGS Mild motion degradation. Internal carotid arteries:  Patent. Anterior cerebral arteries:  Patent. Middle cerebral arteries: Patent. Anterior communicating artery: Not identified, likely hypoplastic or absent. Posterior communicating arteries:  Patent. Posterior cerebral arteries:  Patent. Basilar artery:  Patent. Vertebral arteries:  Patent. No evidence of high-grade stenosis, large vessel occlusion, or aneurysm unless noted above. IMPRESSION: MRI head: 1. No acute intracranial abnormality. 2. Nonspecific white matter lesions likely representing mild chronic microvascular ischemic changes. No specific findings for demyelination. 3. Mild brain parenchymal volume loss. MRA head: Mild motion degradation. Patent circle of Willis. No large vessel occlusion, aneurysm, or significant stenosis is identified. Electronically Signed   By: Kristine Garbe M.D.   On: 03/14/2017 21:55   Mr Brain Wo Contrast  Result Date: 03/14/2017 CLINICAL DATA:  56 y/o M; history of schizoaffective disorder. Initial complaint of left face/ cheek and left lateral leg tingling. EXAM: MRI HEAD WITHOUT CONTRAST MRA HEAD WITHOUT CONTRAST TECHNIQUE: Multiplanar, multiecho pulse sequences of the brain and surrounding structures were obtained without intravenous contrast. Angiographic images of the head were obtained using MRA technique without contrast. COMPARISON:  03/13/2017 CT head FINDINGS: MRI HEAD FINDINGS Brain: No acute infarction, hemorrhage, hydrocephalus, extra-axial collection or mass lesion. Several nonspecific scattered foci of T2 FLAIR hyperintense signal abnormality are present in subcortical and periventricular white matter. No lesion is present within corpus callosum, brainstem, or cerebellum. Tiny T2 hyperintense foci are present in the left putamen and left thalamus. Mild brain parenchymal volume loss. Vascular: As below. Skull and upper  cervical spine: Normal marrow signal. Sinuses/Orbits: Negative. Other: None. MRA HEAD FINDINGS Mild motion degradation. Internal carotid arteries:  Patent. Anterior cerebral arteries:  Patent. Middle cerebral arteries: Patent. Anterior communicating artery: Not identified, likely hypoplastic or absent. Posterior communicating arteries:  Patent. Posterior cerebral arteries:  Patent. Basilar artery:  Patent. Vertebral arteries:  Patent. No evidence of high-grade stenosis, large vessel occlusion, or aneurysm unless noted above. IMPRESSION: MRI head: 1. No acute intracranial abnormality. 2. Nonspecific white matter lesions likely representing mild chronic microvascular ischemic changes. No specific findings for demyelination. 3. Mild brain parenchymal volume loss. MRA head: Mild motion degradation. Patent circle of Willis. No large vessel occlusion, aneurysm, or significant stenosis is identified. Electronically Signed   By: Kristine Garbe M.D.   On: 03/14/2017 21:55   Dg Chest Port 1 View  Result Date: 03/15/2017 CLINICAL DATA:  Septic shock EXAM: PORTABLE CHEST 1 VIEW COMPARISON:  03/13/2017 FINDINGS: Hypoventilation with decreased lung volume. Increase in bibasilar atelectasis/ infiltrate. Negative for heart failure or effusion. IMPRESSION: Increase in bibasilar airspace disease most likely atelectasis. Low lung volumes. Electronically Signed   By: Franchot Gallo M.D.   On: 03/15/2017 07:29   Dg Abd Portable 1v  Result Date: 03/16/2017 CLINICAL DATA:  Abdominal pain and constipation EXAM: PORTABLE ABDOMEN - 1 VIEW COMPARISON:  Portable exam 1250 hours compared to CT abdomen and pelvis of 03/13/2017 FINDINGS: Normal bowel gas pattern. No bowel dilatation bowel wall thickening. Bones demineralized. No pathologic calcifications. Partially distended urinary bladder. IMPRESSION: No acute abnormalities. Electronically Signed   By: Lavonia Dana M.D.   On: 03/16/2017 13:34    Scheduled Meds: . heparin   5,000 Units Subcutaneous Q8H  . insulin aspart  6 Units Intravenous Once  . mouth rinse  15 mL Mouth Rinse BID  . metoCLOPramide (REGLAN) injection  5 mg Intravenous Q8H   Continuous Infusions:   LOS: 3 days   Gissell Barra, Orpah Melter, MD [facility] Pager [phone]  If 7PM-7AM, please contact night-coverage [email or web address] Password [email or web address] 03/16/2017, 6:13 PM

## 2017-03-16 NOTE — Progress Notes (Signed)
VASCULAR LAB PRELIMINARY  PRELIMINARY  PRELIMINARY  PRELIMINARY  Carotid duplex completed.    Preliminary report:  1-39% ICA plaquing. Vertebral artery flow is antegrade.   Irvan Tiedt, RVT 03/16/2017, 9:45 AM

## 2017-03-17 DIAGNOSIS — R739 Hyperglycemia, unspecified: Secondary | ICD-10-CM

## 2017-03-17 LAB — BASIC METABOLIC PANEL
Anion gap: 10 (ref 5–15)
BUN: 12 mg/dL (ref 6–20)
CO2: 27 mmol/L (ref 22–32)
Calcium: 9.4 mg/dL (ref 8.9–10.3)
Chloride: 104 mmol/L (ref 101–111)
Creatinine, Ser: 1.14 mg/dL (ref 0.61–1.24)
GFR calc Af Amer: >60 mL/min (ref >60)
GFR calc non Af Amer: >60 mL/min (ref >60)
Glucose, Bld: 116 mg/dL — ABNORMAL HIGH (ref 65–99)
Potassium: 3.4 mmol/L — ABNORMAL LOW (ref 3.5–5.1)
Sodium: 141 mmol/L (ref 135–145)

## 2017-03-17 MED ORDER — INDOMETHACIN 25 MG PO CAPS
50.0000 mg | ORAL_CAPSULE | Freq: Two times a day (BID) | ORAL | Status: DC
  Filled 2017-03-17: qty 2

## 2017-03-17 MED ORDER — PANTOPRAZOLE SODIUM 40 MG PO TBEC
40.0000 mg | DELAYED_RELEASE_TABLET | Freq: Every day | ORAL | Status: DC
  Administered 2017-03-17 – 2017-03-18 (×2): 40 mg via ORAL
  Filled 2017-03-17 (×2): qty 1

## 2017-03-17 MED ORDER — INDOMETHACIN 25 MG PO CAPS
50.0000 mg | ORAL_CAPSULE | Freq: Two times a day (BID) | ORAL | Status: DC
  Administered 2017-03-17 – 2017-03-18 (×2): 50 mg via ORAL
  Filled 2017-03-17 (×4): qty 2

## 2017-03-17 NOTE — Progress Notes (Signed)
PROGRESS NOTE    Travis [name]  WUJ:[id number] DOB: 1961/03/14 DOA: 03/13/2017 PCP: Redmond School, MD    Brief Narrative:  See dictated H and P by Critical Care dated 7/19 for details. Briefly, 56yo male with hx of HTN, DM, schizoaffective d/o presented to the ED with L face/cheek and L lateral leg tingling. Patient wa found to have lactate of 4.8 with metabolic acidosis and arf with WBC of 22.9K. Patient was admitted to the critical care service for further work up.  Assessment & Plan:   Active Problems:   Hypotension   Left sided numbness   Sepsis (HCC)   Elevated lactic acid level   Leukocytosis   Volume depletion   AKI (acute kidney injury) ([place])   Lactic acid acidosis   1. Sepsis with unknown source present on admission 1. Uncertain etiology noted 2. Sepsis physiology resolved 3. Patient initially on vanc and zosyn, narrowed to zosyn monotherapy 4. Pt continued on PO augmentin 5. Blood cx noted to be neg 2. Abd pain 1. Complains of generalized pain 2. Presenting CT reviewed. Patient with stool throughout the colon, most notably in the ascending colon per my own read. 3. Patient reports multiple large amounts of stool on 7/19-7/20 4. Have ordered and reviewed abd xray. Normal bowel gas pattern noted. 5. Given hx of diabetes, question underlying gastroparesis.  6. Patient is now on reglan. Reports flatus overnight.  7. Diet is slow to advance 3. Constipation 1. Noted on presenting CT abd/pelvis 2. Normal bowel gas pattern seen on follow up abd xray 3. Patient is continued on reglan 4. ARF 1. Suspect secondary to dehydration/pre-renal causes 2. Renal function improving with IVF 5. Dehydration 1. Improved with hydration 2. Improving 6. Lactic acidosis 1. Resolved 2. Stable at present 7. L sided numbness 1. Seen by Neurology 2. MRI neg for acute process 3. No further w/u per Neurology recommendations. If symptoms persist, then Neurology recommends outpatient  EMG study. 4. Presently stable 8. Hypotension 1. Improved 2. BP meds remain on hold 3. Currently stable  DVT prophylaxis: Heparin subQ Code Status: Full Family Communication: Pt in room, family at bedside Disposition Plan: Uncertain at this time  Consultants:   Critical Care  Procedures:     Antimicrobials: Anti-infectives    Start     Dose/Rate Route Frequency Ordered Stop   03/16/17 2200  amoxicillin-clavulanate (AUGMENTIN) 875-125 MG per tablet 1 tablet     1 tablet Oral Every 12 hours 03/16/17 1821     03/14/17 2200  piperacillin-tazobactam (ZOSYN) IVPB 3.375 g  Status:  Discontinued     3.375 g 12.5 mL/hr over 240 Minutes Intravenous Every 8 hours 03/14/17 1401 03/15/17 0802   03/13/17 2230  piperacillin-tazobactam (ZOSYN) IVPB 2.25 g  Status:  Discontinued     2.25 g 100 mL/hr over 30 Minutes Intravenous Every 6 hours 03/13/17 1631 03/14/17 1401   03/13/17 1600  piperacillin-tazobactam (ZOSYN) IVPB 3.375 g     3.375 g 100 mL/hr over 30 Minutes Intravenous  Once 03/13/17 1556 03/13/17 1653   03/13/17 1600  vancomycin (VANCOCIN) IVPB 1000 mg/200 mL premix  Status:  Discontinued     1,000 mg 200 mL/hr over 60 Minutes Intravenous  Once 03/13/17 1556 03/13/17 1557   03/13/17 1600  vancomycin (VANCOCIN) 2,000 mg in sodium chloride 0.9 % 500 mL IVPB     2,000 mg 250 mL/hr over 120 Minutes Intravenous  Once 03/13/17 1557 03/13/17 1847      Subjective: Reports passing flatus  overngiht, still with abd discomfort  Objective: Vitals:   03/16/17 2109 03/17/17 0220 03/17/17 0552 03/17/17 0722  BP: 106/60  (!) 105/57 112/76  Pulse: 87  82 86  Resp: 19  19 18   Temp: 98.6 F (37 C)  98.1 F (36.7 C) 98.5 F (36.9 C)  TempSrc: Oral  Oral Oral  SpO2: 97%  96% 97%  Weight: 81.4 kg (179 lb 7.3 oz) 81.4 kg (179 lb 7.3 oz)    Height:        Intake/Output Summary (Last 24 hours) at 03/17/17 1426 Last data filed at 03/17/17 0604  Gross per 24 hour  Intake               700 ml  Output                0 ml  Net              700 ml   Filed Weights   03/16/17 0500 03/16/17 2109 03/17/17 0220  Weight: 86.7 kg (191 lb 2.2 oz) 81.4 kg (179 lb 7.3 oz) 81.4 kg (179 lb 7.3 oz)    Examination: General exam: Awake, laying in bed, in nad Respiratory system: Normal respiratory effort, no wheezing Cardiovascular system: regular rate, s1, s2 Gastrointestinal system: Soft, nondistended, positive BS Central nervous system: CN2-12 grossly intact, strength intact Extremities: Perfused, no clubbing Skin: Normal skin turgor, no notable skin lesions seen Psychiatry: Mood normal // no visual hallucinations    Data Reviewed: I have personally reviewed following labs and imaging studies  CBC:  Recent Labs Lab 03/13/17 1430 03/14/17 0451 03/15/17 0105  WBC 22.9* 19.6* 9.9  NEUTROABS  --   --  5.5  HGB 14.1 11.9* 11.5*  HCT 41.8 36.3* 35.2*  MCV 85.1 86.4 86.7  PLT 298 276 503   Basic Metabolic Panel:  Recent Labs Lab 03/13/17 1430 03/14/17 0451 03/15/17 0105 03/17/17 0501  NA 136 142 140 141  K 4.0 4.1 3.4* 3.4*  CL 103 118* 110 104  CO2 11* 16* 22 27  GLUCOSE 184* 87 70 116*  BUN 49* 39* 18 12  CREATININE 3.89* 2.53* 1.50* 1.14  CALCIUM 9.6 7.6* 8.4* 9.4   GFR: Estimated Creatinine Clearance: 71.6 mL/min (by C-G formula based on SCr of 1.14 mg/dL). Liver Function Tests:  Recent Labs Lab 03/13/17 1537 03/13/17 1821 03/15/17 0105  AST 34 30 23  ALT 33 33 29  ALKPHOS 42 36* 42  BILITOT 0.6 0.5 1.1  PROT 6.8 5.7* 5.8*  ALBUMIN 3.9 3.2* 3.1*    Recent Labs Lab 03/13/17 1537 03/13/17 1821  LIPASE 74* 58*   No results for input(s): AMMONIA in the last 168 hours. Coagulation Profile: No results for input(s): INR, PROTIME in the last 168 hours. Cardiac Enzymes:  Recent Labs Lab 03/13/17 1821 03/13/17 2231 03/14/17 0451  TROPONINI <0.03 <0.03 <0.03   BNP (last 3 results) No results for input(s): PROBNP in the last 8760  hours. HbA1C: No results for input(s): HGBA1C in the last 72 hours. CBG:  Recent Labs Lab 03/15/17 2107 03/16/17 0738 03/16/17 1230 03/16/17 1610 03/16/17 2106  GLUCAP 125* 100* 93 92 132*   Lipid Profile: No results for input(s): CHOL, HDL, LDLCALC, TRIG, CHOLHDL, LDLDIRECT in the last 72 hours. Thyroid Function Tests: No results for input(s): TSH, T4TOTAL, FREET4, T3FREE, THYROIDAB in the last 72 hours. Anemia Panel: No results for input(s): VITAMINB12, FOLATE, FERRITIN, TIBC, IRON, RETICCTPCT in the last 72 hours. Sepsis Labs:  Recent Labs Lab 03/13/17 1559 03/13/17 1821 03/13/17 1827 03/13/17 2231 03/14/17 0451 03/15/17 0105  PROCALCITON  --  0.13  --   --  0.12 0.10  LATICACIDVEN 4.88* 2.4* 2.58* 1.2  --   --     Recent Results (from the past 240 hour(s))  Blood Culture (routine x 2)     Status: None (Preliminary result)   Collection Time: 03/13/17  3:44 PM  Result Value Ref Range Status   Specimen Description BLOOD RIGHT FOREARM  Final   Special Requests   Final    BOTTLES DRAWN AEROBIC AND ANAEROBIC Blood Culture adequate volume   Culture NO GROWTH 3 DAYS  Final   Report Status PENDING  Incomplete  Blood Culture (routine x 2)     Status: None (Preliminary result)   Collection Time: 03/13/17  3:51 PM  Result Value Ref Range Status   Specimen Description BLOOD RIGHT WRIST  Final   Special Requests   Final    BOTTLES DRAWN AEROBIC AND ANAEROBIC Blood Culture adequate volume   Culture NO GROWTH 3 DAYS  Final   Report Status PENDING  Incomplete  MRSA PCR Screening     Status: None   Collection Time: 03/13/17  8:57 PM  Result Value Ref Range Status   MRSA by PCR NEGATIVE NEGATIVE Final    Comment:        The GeneXpert MRSA Assay (FDA approved for NASAL specimens only), is one component of a comprehensive MRSA colonization surveillance program. It is not intended to diagnose MRSA infection nor to guide or monitor treatment for MRSA infections.    Gastrointestinal Panel by PCR , Stool     Status: None   Collection Time: 03/14/17  7:29 PM  Result Value Ref Range Status   Campylobacter species NOT DETECTED NOT DETECTED Final   Plesimonas shigelloides NOT DETECTED NOT DETECTED Final   Salmonella species NOT DETECTED NOT DETECTED Final   Yersinia enterocolitica NOT DETECTED NOT DETECTED Final   Vibrio species NOT DETECTED NOT DETECTED Final   Vibrio cholerae NOT DETECTED NOT DETECTED Final   Enteroaggregative E coli (EAEC) NOT DETECTED NOT DETECTED Final   Enteropathogenic E coli (EPEC) NOT DETECTED NOT DETECTED Final   Enterotoxigenic E coli (ETEC) NOT DETECTED NOT DETECTED Final   Shiga like toxin producing E coli (STEC) NOT DETECTED NOT DETECTED Final   Shigella/Enteroinvasive E coli (EIEC) NOT DETECTED NOT DETECTED Final   Cryptosporidium NOT DETECTED NOT DETECTED Final   Cyclospora cayetanensis NOT DETECTED NOT DETECTED Final   Entamoeba histolytica NOT DETECTED NOT DETECTED Final   Giardia lamblia NOT DETECTED NOT DETECTED Final   Adenovirus F40/41 NOT DETECTED NOT DETECTED Final   Astrovirus NOT DETECTED NOT DETECTED Final   Norovirus GI/GII NOT DETECTED NOT DETECTED Final   Rotavirus A NOT DETECTED NOT DETECTED Final   Sapovirus (I, II, IV, and V) NOT DETECTED NOT DETECTED Final     Radiology Studies: Dg Abd Portable 1v  Result Date: 03/16/2017 CLINICAL DATA:  Abdominal pain and constipation EXAM: PORTABLE ABDOMEN - 1 VIEW COMPARISON:  Portable exam 1250 hours compared to CT abdomen and pelvis of 03/13/2017 FINDINGS: Normal bowel gas pattern. No bowel dilatation bowel wall thickening. Bones demineralized. No pathologic calcifications. Partially distended urinary bladder. IMPRESSION: No acute abnormalities. Electronically Signed   By: Lavonia Dana M.D.   On: 03/16/2017 13:34    Scheduled Meds: . amoxicillin-clavulanate  1 tablet Oral Q12H  . heparin  5,000 Units Subcutaneous Q8H  .  indomethacin  50 mg Oral BID WC  .  insulin aspart  6 Units Intravenous Once  . mouth rinse  15 mL Mouth Rinse BID  . metoCLOPramide (REGLAN) injection  5 mg Intravenous Q8H  . pantoprazole  40 mg Oral Daily   Continuous Infusions:   LOS: 4 days   CHIU, Orpah Melter, MD [facility] Pager [phone]  If 7PM-7AM, please contact night-coverage [email or web address] Password [email or web address] 03/17/2017, 2:26 PM

## 2017-03-17 NOTE — Evaluation (Signed)
Physical Therapy Evaluation Patient Details Name: Travis [name] MRN: [id number] DOB: 1961-01-26 Today's Date: 03/17/2017   History of Present Illness  Pt is 56 y/o male with hx of HTN, DM, schizoaffective d/o who presented to the ED with L face/cheek and L lateral leg tingling. Patient was found to have lactate of 4.8 with metabolic acidosis and arf. Pt admitted with sepsis - MRI brain negative for acute changes.  Clinical Impression  Pt admitted with above diagnosis. Pt currently with functional limitations due to the deficits listed below (see PT Problem List). At the time of PT eval pt was able to ambulate a limited distance 2 R knee pain, abdominal pain, and nausea. RN notified of pt's request for nausea medication. Pt reports that once he is back home and on his usual medication for pain, he will not need any DME. PT recommended equipment listed below in case pt changes his mind. Pt will benefit from skilled PT to increase their independence and safety with mobility to allow discharge to the venue listed below.       Follow Up Recommendations Outpatient PT;Supervision for mobility/OOB    Equipment Recommendations  Rolling walker with 5" wheels;3in1 (PT)    Recommendations for Other Services       Precautions / Restrictions Precautions Precautions: Fall Precaution Comments: R knee pain - pt states is baseline but worsened as he has been off of his usual pain medication Restrictions Weight Bearing Restrictions: No      Mobility  Bed Mobility Overal bed mobility: Needs Assistance Bed Mobility: Supine to Sit;Sit to Supine     Supine to sit: Supervision Sit to supine: Supervision   General bed mobility comments: Effortful and with increased time. Pt reports nausea with movement   Transfers Overall transfer level: Needs assistance Equipment used: Rolling walker (2 wheeled) Transfers: Sit to/from Stand Sit to Stand: Min guard         General transfer comment:  Hands-on guarding for balance support and safety as pt powered up to full standing position. Appeared painful and effortful.   Ambulation/Gait Ambulation/Gait assistance: Min assist Ambulation Distance (Feet): 75 Feet Assistive device: Rolling walker (2 wheeled) Gait Pattern/deviations: Step-through pattern;Decreased stride length;Trunk flexed;Antalgic Gait velocity: Decreased Gait velocity interpretation: Below normal speed for age/gender General Gait Details: Antalgic due to R knee pain. Pt with grossly flexed trunk but able to make corrective changes with cues. Overall pt appears to be in pain with each step and continues to report nausea.   Stairs            Wheelchair Mobility    Modified Rankin (Stroke Patients Only)       Balance Overall balance assessment: Needs assistance Sitting-balance support: Feet supported;No upper extremity supported Sitting balance-Leahy Scale: Fair     Standing balance support: No upper extremity supported;During functional activity Standing balance-Leahy Scale: Fair                               Pertinent Vitals/Pain Pain Assessment: Faces Faces Pain Scale: Hurts even more Pain Location: R knee, R abdomen ("It's probably gas" - per pt) Pain Descriptors / Indicators: Aching;Discomfort Pain Intervention(s): Limited activity within patient's tolerance;Monitored during session;Repositioned    Home Living Family/patient expects to be discharged to:: Private residence Living Arrangements: Spouse/significant other Available Help at Discharge: Family;Available 24 hours/day Type of Home: House Home Access: Stairs to enter Entrance Stairs-Rails: None Entrance Stairs-Number of Steps: 2 Home  Layout: One level Home Equipment: None      Prior Function Level of Independence: Independent         Comments: 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 He has not been on his usual pain medication since being admitted and reports a drastic increase in pain  RLE: Unable to fully assess due to pain    Cervical / Trunk Assessment Cervical / Trunk Assessment: Normal  Communication   Communication: No difficulties  Cognition Arousal/Alertness: Awake/alert Behavior During Therapy: Anxious Overall Cognitive Status: Within Functional Limits for tasks assessed                                        General Comments      Exercises     Assessment/Plan    PT Assessment Patient needs continued PT services  PT Problem List Decreased strength;Decreased range of motion;Decreased activity tolerance;Decreased balance;Decreased mobility;Decreased knowledge of use of DME;Decreased safety awareness;Decreased knowledge of precautions;Pain       PT Treatment Interventions DME instruction;Gait training;Stair training;Functional mobility training;Therapeutic activities;Therapeutic exercise;Neuromuscular re-education;Patient/family education    PT Goals (Current goals can be found in the Care Plan section)  Acute Rehab PT Goals Patient Stated Goal: Home today PT Goal Formulation: With patient Time For Goal Achievement: 03/24/17 Potential to Achieve Goals: Good    Frequency Min 3X/week   Barriers to discharge        Co-evaluation               AM-PAC PT "6 Clicks" Daily Activity  Outcome Measure Difficulty turning over in bed (including adjusting bedclothes, sheets and blankets)?: None Difficulty moving from lying on back to sitting on the side of the bed? : A Little Difficulty sitting down on and standing up from  a chair with arms (e.g., wheelchair, bedside commode, etc,.)?: A Little Help needed moving to and from a bed to chair (including a wheelchair)?: A Little Help needed walking in hospital room?: A Little Help needed climbing 3-5 steps with a railing? : A Little 6 Click Score: 19    End of Session Equipment Utilized During Treatment: Gait belt Activity Tolerance: Patient limited by pain Patient left: in bed;with call bell/phone within reach;with family/visitor present Nurse Communication: Mobility status;Other (comment) (Requesting nausea medication - RN notified) PT Visit Diagnosis: Unsteadiness on feet (R26.81);Pain Pain - Right/Left: Right Pain - part of body: Knee    Time: 4235-3614 PT Time Calculation (min) (ACUTE ONLY): 14 min   Charges:   PT Evaluation $PT Eval Moderate Complexity: 1 Procedure     PT G Codes:        Travis [name], PT, DPT Acute Rehabilitation Services Pager: [phone]   Thelma Comp 03/17/2017, 12:39 PM

## 2017-03-18 LAB — BASIC METABOLIC PANEL
Anion gap: 9 (ref 5–15)
BUN: 15 mg/dL (ref 6–20)
CO2: 27 mmol/L (ref 22–32)
Calcium: 9.5 mg/dL (ref 8.9–10.3)
Chloride: 102 mmol/L (ref 101–111)
Creatinine, Ser: 1.17 mg/dL (ref 0.61–1.24)
GFR calc Af Amer: >60 mL/min (ref >60)
GFR calc non Af Amer: >60 mL/min (ref >60)
Glucose, Bld: 149 mg/dL — ABNORMAL HIGH (ref 65–99)
Potassium: 3.3 mmol/L — ABNORMAL LOW (ref 3.5–5.1)
Sodium: 138 mmol/L (ref 135–145)

## 2017-03-18 LAB — VAS US CAROTID
LEFT ECA DIAS: -15 cm/s
LEFT VERTEBRAL DIAS: 45 cm/s
Left CCA dist dias: -37 cm/s
Left CCA dist sys: -104 cm/s
Left CCA prox dias: 24 cm/s
Left CCA prox sys: 124 cm/s
Left ICA dist dias: -29 cm/s
Left ICA dist sys: -76 cm/s
Left ICA prox dias: -25 cm/s
Left ICA prox sys: -103 cm/s
RIGHT ECA DIAS: -16 cm/s
RIGHT VERTEBRAL DIAS: -16 cm/s
Right CCA prox dias: 30 cm/s
Right CCA prox sys: 114 cm/s
Right cca dist sys: -108 cm/s

## 2017-03-18 LAB — CULTURE, BLOOD (ROUTINE X 2)
Culture: NO GROWTH
Culture: NO GROWTH
Special Requests: ADEQUATE
Special Requests: ADEQUATE

## 2017-03-18 MED ORDER — PANTOPRAZOLE SODIUM 40 MG PO TBEC: 40.0000 mg | DELAYED_RELEASE_TABLET | Freq: Every day | ORAL | 0 refills | Status: DC

## 2017-03-18 MED ORDER — METOCLOPRAMIDE HCL 5 MG PO TABS: 5.0000 mg | ORAL_TABLET | Freq: Three times a day (TID) | ORAL | 0 refills | Status: DC

## 2017-03-18 MED ORDER — POTASSIUM CHLORIDE CRYS ER 20 MEQ PO TBCR
40.0000 meq | EXTENDED_RELEASE_TABLET | Freq: Two times a day (BID) | ORAL | Status: DC
  Administered 2017-03-18: 40 meq via ORAL
  Filled 2017-03-18: qty 2

## 2017-03-18 MED ORDER — SACCHAROMYCES BOULARDII 250 MG PO CAPS: 250.0000 mg | ORAL_CAPSULE | Freq: Two times a day (BID) | ORAL | 0 refills | Status: DC

## 2017-03-18 MED ORDER — AMOXICILLIN-POT CLAVULANATE 875-125 MG PO TABS: 1.0000 | ORAL_TABLET | Freq: Two times a day (BID) | ORAL | 0 refills | Status: DC

## 2017-03-18 MED ORDER — METHYLPREDNISOLONE SODIUM SUCC 40 MG IJ SOLR
40.0000 mg | Freq: Once | INTRAMUSCULAR | Status: AC
  Administered 2017-03-18: 40 mg via INTRAVENOUS
  Filled 2017-03-18: qty 1

## 2017-03-18 NOTE — Care Management Note (Signed)
Case Management Note  Patient Details  Name: Travis [name] MRN: [id number] Date of Birth: 10/16/1960  Subjective/Objective:       CM following for progression and d/c planning. Noted recommendation for Outpatient PT.             Action/Plan: 03/18/2017 Met with pt and wife re Outpatient PT. Pt lives in [place] and this CM offered Outpatient at [facility] , [place], [place] as this is near pt home. Pt and wife state that the pt plans to return to work and will not be available for outpatient PT. Additionally they both state that this pt will see his orthopedist on Thursday, March 20, 2017 and they are hopeful that he will receive an injection for his knee pain as this has been helpful in the past. They also state that the pt needs a knee replacement per his ortho as the doctor as described the knee as "bone on bone".  Pt declined any DME, and stated that he would talk with his ortho on Thursday to determine if any therapy would be helpful at this time given the status of his knees.   Expected Discharge Date:  03/18/17               Expected Discharge Plan:  Home/Self Care  In-House Referral:  NA  Discharge planning Services  CM Consult  Post Acute Care Choice:  NA Choice offered to:  NA  DME Arranged:  N/A DME Agency:  NA  HH Arranged:  NA HH Agency:  NA  Status of Service:  Completed, signed off  If discussed at [place] of Stay Meetings, dates discussed:    Additional Comments:  Adron Bene, RN 03/18/2017, 2:33 PM

## 2017-03-18 NOTE — Progress Notes (Signed)
Discharge instructions, medications and follow up appointments discussed and reviewed with pt and wife at bedside, verbalized understanding. Telemetry dc'ed, CCMD notified. Pt was escorted out of the unit in wheelchair, took all belongings with him.

## 2017-03-18 NOTE — Discharge Summary (Signed)
Physician Discharge Summary  Travis [name] MGQ:[id number] DOB: [birth year] DOA: 03/13/2017  PCP: Redmond School, MD  Admit date: 03/13/2017 Discharge date: 03/18/2017  Admitted From: Home Disposition:  Home  Recommendations for Outpatient Follow-up:  1. Follow up with PCP in 1-2 weeks 2. Follow up with Orthopedic Surgery as already scheduled for 7/26 3. Per Neurology, if L sided numbness reoccurs, recommend outpatient EMG study 4. Please monitor BP. BP meds with exception of cardizem held secondary to septic shock at time of presentation  Discharge Condition:Improved CODE STATUS:Full Diet recommendation: Diabetic   Brief/Interim Summary: See dictated H and P by Critical Care dated 7/19 for details. Briefly, 56yo male with hx of HTN, DM, schizoaffective d/o presented to the ED with L face/cheek and L lateral leg tingling. Patient wa found to have lactate of 4.8 with metabolic acidosis and arf with WBC of 22.9K. Patient was admitted to the critical care service for further work up.  1. Sepsis with unknown source present on admission 1. Uncertain etiology noted 2. Sepsis physiology resolved 3. Patient initially on vanc and zosyn, narrowed to zosyn monotherapy 4. Pt continued on PO augmentin to complete course 5. Blood cx noted to be neg 2. Abd pain 1. Complains of generalized pain 2. Presenting CT reviewed. Patient with stool throughout the colon, most notably in the ascending colon per my own read. 3. Patient reports multiple large amounts of stool on 7/19-7/20 4. Have ordered and reviewed abd xray. Normal bowel gas pattern noted. 5. Given hx of diabetes, question underlying gastroparesis.  6. Patient was started on reglan with clinical improvement. Patient was able to tolerate PO without difficulty 3. Constipation 1. Noted on presenting CT abd/pelvis 2. Normal bowel gas pattern seen on follow up abd xray 3. Patient is continued on reglan 4. ARF 1. Suspect secondary to  dehydration/pre-renal causes 2. Renal function improved with IVF 5. Dehydration 1. Improved with hydration 2. Improved 6. Lactic acidosis 1. Resolved 2. Stable at present 7. L sided numbness 1. Seen by Neurology 2. MRI neg for acute process 3. No further w/u per Neurology recommendations. If symptoms persist, then Neurology recommends outpatient EMG study. 4. Presently stable 8. Hypotension 1. Improved 2. BP meds initially held 3. Blood pressures trending up at time of discharge. Patient to resume cardizem per home regimen, cont to titrate BP meds on discharge 9. Knee pain 1. Pt known to Orthopedic surgery.  2. Patient complains of worsening pain and swelling 3. On exam, knee non-erythematous, no leukocytosis. During this course, indomethacin was held 4. Given one dose of solumedrol with clinical improvement 5. Discussed case with Orthopedic Surgery. Patient was originally planned for have injection done earlier this year, however, pt did not show up for appt. Recommendation to have patient follow up closely as outpatient. Patient's family has already scheduled an appt.  Discharge Diagnoses:  Active Problems:   Hypotension   Left sided numbness   Sepsis (HCC)   Elevated lactic acid level   Leukocytosis   Volume depletion   AKI (acute kidney injury) ([place])   Lactic acid acidosis    Discharge Instructions   Allergies as of 03/18/2017      Reactions   Doxycycline Other (See Comments)   Reaction:  Blisters    Lisinopril Swelling      Medication List    STOP taking these medications   amLODipine 10 MG tablet Commonly known as:  NORVASC   chlorthalidone 25 MG tablet Commonly known as:  HYGROTON   losartan  100 MG tablet Commonly known as:  COZAAR     TAKE these medications   amoxicillin-clavulanate 875-125 MG tablet Commonly known as:  AUGMENTIN Take 1 tablet by mouth every 12 (twelve) hours.   aspirin EC 81 MG tablet Take 81 mg by mouth daily.   diltiazem  240 MG 24 hr capsule Commonly known as:  DILACOR XR Take 240 mg by mouth daily.   glipiZIDE 10 MG 24 hr tablet Commonly known as:  GLUCOTROL XL Take 10 mg by mouth 2 (two) times daily.   indomethacin 25 MG capsule Commonly known as:  INDOCIN Take 50 mg by mouth 2 (two) times daily with a meal.   loratadine 10 MG tablet Commonly known as:  CLARITIN Take 10 mg by mouth daily.   metFORMIN 1000 MG tablet Commonly known as:  GLUCOPHAGE Take 1,000 mg by mouth 2 (two) times daily with a meal.   metoCLOPramide 5 MG tablet Commonly known as:  REGLAN Take 1 tablet (5 mg total) by mouth 3 (three) times daily before meals.   nitroGLYCERIN 0.4 MG SL tablet Commonly known as:  NITROSTAT Place 0.4 mg under the tongue every 5 (five) minutes as needed for chest pain.   omeprazole 40 MG capsule Commonly known as:  PRILOSEC Take 40 mg by mouth daily.   pantoprazole 40 MG tablet Commonly known as:  PROTONIX Take 1 tablet (40 mg total) by mouth daily.   saccharomyces boulardii 250 MG capsule Commonly known as:  FLORASTOR Take 1 capsule (250 mg total) by mouth 2 (two) times daily.   tamsulosin 0.4 MG Caps capsule Commonly known as:  FLOMAX Take 0.4 mg by mouth daily after breakfast.   traZODone 100 MG tablet Commonly known as:  DESYREL Take 100 mg by mouth at bedtime.      Follow-up Information    Redmond School, MD. Schedule an appointment as soon as possible for a visit in 1 week(s).   Specialty:  Internal Medicine Contact information: [address] [place] [address] [phone]        Latanya Maudlin, MD Follow up on 03/20/2017.   Specialty:  Orthopedic Surgery Why:  as scheduled Contact information: [address] [place] [address] [phone]          Allergies  Allergen Reactions  . Doxycycline Other (See Comments)    Reaction:  Blisters   . Lisinopril Swelling    Consultations:  Discussed case with Orthopedic  Surgery  Procedures/Studies: Ct Abdomen Pelvis Wo Contrast  Result Date: 03/13/2017 CLINICAL DATA:  Chest and back pain. EXAM: CT CHEST, ABDOMEN AND PELVIS WITHOUT CONTRAST TECHNIQUE: Multidetector CT imaging of the chest, abdomen and pelvis was performed following the standard protocol without IV contrast. COMPARISON:  01/20/2014 FINDINGS: CT CHEST FINDINGS Cardiovascular: Normal size cardiac chambers with minimal coronary arteriosclerosis along the circumflex. No pericardial effusion. Minimal aortic atherosclerosis at the arch without aneurysm. Top normal caliber of the main pulmonary artery. Normal branch pattern of the great vessels. Mediastinum/Nodes: No mediastinal or hilar adenopathy. Unremarkable CT appearance of the esophagus, trachea and mainstem bronchi. No thyromegaly. Lungs/Pleura: Lungs are clear. No pleural effusion or pneumothorax. Musculoskeletal: Mild disc space narrowing of the mid to lower thoracic spine from T4 caudad through T11. No acute nor suspicious osseous abnormalities. No chest wall abnormality noted. CT ABDOMEN PELVIS FINDINGS Hepatobiliary: Hepatic steatosis. Physiologically distended gallbladder with areas of focal fatty sparing about the gallbladder fossa. No gallstones or biliary dilatation. Pancreas: Normal Spleen: Normal Adrenals/Urinary Tract: Normal  bilateral adrenal glands. Stable 14 mm water attenuating cyst off the lower pole the right kidney. Tiny exophytic too small to further characterize hypodensities are also noted of the left kidney, the largest approximately 6 mm. Punctate cortical calcification involving the interpolar cortex of the right kidney consistent with nephrolithiasis. Stomach/Bowel: Contracted stomach with normal ligament of Treitz position. No bowel obstruction or inflammation. Appendectomy by report. Vascular/Lymphatic: Aortoiliac atherosclerosis without aneurysm. 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 No lymphadenopathy. Reproductive: Normal prostate and seminal vesicles. Other: No abdominal wall hernia or abnormality. No abdominopelvic ascites. Musculoskeletal: Degenerative disc disease L4-5 with grade 1 anterolisthesis of L4 on L5. No acute nor suspicious osseous abnormalities. Sclerotic foci of the left pubic symphysis, acetabular roofs and right femoral neck consistent with bone islands. IMPRESSION: 1. Coronary arteriosclerosis along the circumflex. 2. Minimal aortic atherosclerosis. 3. Hepatic steatosis. 4. Small bilateral renal cysts, some are too small to further characterize. Nonobstructing right-sided punctate renal calculus. 5. 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 Electronically Signed   By: Ashley Royalty M.D.   On: 03/13/2017 17:46   Ct Head Wo Contrast  Result Date: 03/13/2017 CLINICAL DATA:  Hip pain EXAM: CT HEAD WITHOUT CONTRAST TECHNIQUE: Contiguous axial images were obtained from the base of the skull through the vertex without intravenous contrast. COMPARISON:  CT 10/14/2014 FINDINGS: Brain: No evidence of acute infarction, hemorrhage, hydrocephalus, extra-axial collection or mass lesion/mass effect. Vascular: Negative for hyperdense vessel Skull: Negative Sinuses/Orbits: Negative Other: None IMPRESSION: Negative CT head Electronically Signed   By: Franchot Gallo M.D.   On: 03/13/2017 16:08   Ct Chest Wo Contrast  Result Date: 03/13/2017 CLINICAL DATA:  Chest and back pain. EXAM: CT CHEST, ABDOMEN AND PELVIS WITHOUT CONTRAST TECHNIQUE: Multidetector CT imaging of the chest, abdomen and pelvis was performed following the standard protocol without IV contrast. COMPARISON:  01/20/2014 FINDINGS: CT CHEST FINDINGS Cardiovascular: Normal size cardiac chambers with minimal coronary arteriosclerosis along the circumflex. No pericardial effusion.  Minimal aortic atherosclerosis at the arch without aneurysm. Top normal caliber of the main pulmonary artery. Normal branch pattern of the great vessels. Mediastinum/Nodes: No mediastinal or hilar adenopathy. Unremarkable CT appearance of the esophagus, trachea and mainstem bronchi. No thyromegaly. Lungs/Pleura: Lungs are clear. No pleural effusion or pneumothorax. Musculoskeletal: Mild disc space narrowing of the mid to lower thoracic spine from T4 caudad through T11. No acute nor suspicious osseous abnormalities. No chest wall abnormality noted. CT ABDOMEN PELVIS FINDINGS Hepatobiliary: Hepatic steatosis. Physiologically distended gallbladder with areas of focal fatty sparing about the gallbladder fossa. No gallstones or biliary dilatation. Pancreas: Normal Spleen: Normal Adrenals/Urinary Tract: Normal bilateral adrenal glands. Stable 14 mm water attenuating cyst off the lower pole the right kidney. Tiny exophytic too small to further characterize hypodensities are also noted of the left kidney, the largest approximately 6 mm. Punctate cortical calcification involving the interpolar cortex of the right kidney consistent with nephrolithiasis. Stomach/Bowel: Contracted stomach with normal ligament of Treitz position. No bowel obstruction or inflammation. Appendectomy by report. Vascular/Lymphatic: Aortoiliac atherosclerosis without aneurysm. 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 No lymphadenopathy. Reproductive: Normal prostate and seminal vesicles. Other: No abdominal wall hernia or abnormality. No abdominopelvic ascites. Musculoskeletal: Degenerative disc disease L4-5 with grade 1 anterolisthesis of L4 on L5. No acute nor suspicious osseous abnormalities. Sclerotic  foci of the left pubic symphysis, acetabular roofs and right femoral neck consistent with bone islands. IMPRESSION: 1. Coronary arteriosclerosis along the circumflex. 2.  Minimal aortic atherosclerosis. 3. Hepatic steatosis. 4. Small bilateral renal cysts, some are too small to further characterize. Nonobstructing right-sided punctate renal calculus. 5. 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 Electronically Signed   By: Ashley Royalty M.D.   On: 03/13/2017 17:46   Mr Jodene Nam Head Wo Contrast  Result Date: 03/14/2017 CLINICAL DATA:  56 y/o M; history of schizoaffective disorder. Initial complaint of left face/ cheek and left lateral leg tingling. EXAM: MRI HEAD WITHOUT CONTRAST MRA HEAD WITHOUT CONTRAST TECHNIQUE: Multiplanar, multiecho pulse sequences of the brain and surrounding structures were obtained without intravenous contrast. Angiographic images of the head were obtained using MRA technique without contrast. COMPARISON:  03/13/2017 CT head FINDINGS: MRI HEAD FINDINGS Brain: No acute infarction, hemorrhage, hydrocephalus, extra-axial collection or mass lesion. Several nonspecific scattered foci of T2 FLAIR hyperintense signal abnormality are present in subcortical and periventricular white matter. No lesion is present within corpus callosum, brainstem, or cerebellum. Tiny T2 hyperintense foci are present in the left putamen and left thalamus. Mild brain parenchymal volume loss. Vascular: As below. Skull and upper cervical spine: Normal marrow signal. Sinuses/Orbits: Negative. Other: None. MRA HEAD FINDINGS Mild motion degradation. Internal carotid arteries:  Patent. Anterior cerebral arteries:  Patent. Middle cerebral arteries: Patent. Anterior communicating artery: Not identified, likely hypoplastic or absent. Posterior communicating arteries:  Patent. Posterior cerebral arteries:  Patent. Basilar artery:  Patent. Vertebral arteries:  Patent. No evidence of high-grade stenosis, large vessel occlusion, or aneurysm unless noted above. IMPRESSION: MRI head: 1. No acute  intracranial abnormality. 2. Nonspecific white matter lesions likely representing mild chronic microvascular ischemic changes. No specific findings for demyelination. 3. Mild brain parenchymal volume loss. MRA head: Mild motion degradation. Patent circle of Willis. No large vessel occlusion, aneurysm, or significant stenosis is identified. Electronically Signed   By: Kristine Garbe M.D.   On: 03/14/2017 21:55   Mr Brain Wo Contrast  Result Date: 03/14/2017 CLINICAL DATA:  56 y/o M; history of schizoaffective disorder. Initial complaint of left face/ cheek and left lateral leg tingling. EXAM: MRI HEAD WITHOUT CONTRAST MRA HEAD WITHOUT CONTRAST TECHNIQUE: Multiplanar, multiecho pulse sequences of the brain and surrounding structures were obtained without intravenous contrast. Angiographic images of the head were obtained using MRA technique without contrast. COMPARISON:  03/13/2017 CT head FINDINGS: MRI HEAD FINDINGS Brain: No acute infarction, hemorrhage, hydrocephalus, extra-axial collection or mass lesion. Several nonspecific scattered foci of T2 FLAIR hyperintense signal abnormality are present in subcortical and periventricular white matter. No lesion is present within corpus callosum, brainstem, or cerebellum. Tiny T2 hyperintense foci are present in the left putamen and left thalamus. Mild brain parenchymal volume loss. Vascular: As below. Skull and upper cervical spine: Normal marrow signal. Sinuses/Orbits: Negative. Other: None. MRA HEAD FINDINGS Mild motion degradation. Internal carotid arteries:  Patent. Anterior cerebral arteries:  Patent. Middle cerebral arteries: Patent. Anterior communicating artery: Not identified, likely hypoplastic or absent. Posterior communicating arteries:  Patent. Posterior cerebral arteries:  Patent. Basilar artery:  Patent. Vertebral arteries:  Patent. No evidence of high-grade stenosis, large vessel occlusion, or aneurysm unless noted above. IMPRESSION: MRI head:  1. No acute intracranial abnormality. 2. Nonspecific white matter lesions likely representing mild chronic microvascular ischemic changes. No specific findings for demyelination. 3. Mild brain parenchymal volume  loss. MRA head: Mild motion degradation. Patent circle of Willis. No large vessel occlusion, aneurysm, or significant stenosis is identified. Electronically Signed   By: Kristine Garbe M.D.   On: 03/14/2017 21:55   Dg Chest Port 1 View  Result Date: 03/15/2017 CLINICAL DATA:  Septic shock EXAM: PORTABLE CHEST 1 VIEW COMPARISON:  03/13/2017 FINDINGS: Hypoventilation with decreased lung volume. Increase in bibasilar atelectasis/ infiltrate. Negative for heart failure or effusion. IMPRESSION: Increase in bibasilar airspace disease most likely atelectasis. Low lung volumes. Electronically Signed   By: Franchot Gallo M.D.   On: 03/15/2017 07:29   Dg Chest Port 1 View  Result Date: 03/13/2017 CLINICAL DATA:  Pt c/o left side face, left side neck and left leg numbness. Hx HTN, nonsmoker. EXAM: PORTABLE CHEST 1 VIEW COMPARISON:  Chest x-ray dated 03/31/2015. FINDINGS: Study is hypoinspiratory with crowding of the perihilar and bibasilar bronchovascular markings. Given the low lung volumes, lungs are clear. No pleural effusion or pneumothorax seen. Heart size and mediastinal contours are within normal limits. Osseous structures about the chest are unremarkable. IMPRESSION: No active disease. No evidence of pneumonia or pulmonary edema. Low lung volumes. Electronically Signed   By: Franki Cabot M.D.   On: 03/13/2017 15:15   Dg Abd Portable 1v  Result Date: 03/16/2017 CLINICAL DATA:  Abdominal pain and constipation EXAM: PORTABLE ABDOMEN - 1 VIEW COMPARISON:  Portable exam 1250 hours compared to CT abdomen and pelvis of 03/13/2017 FINDINGS: Normal bowel gas pattern. No bowel dilatation bowel wall thickening. Bones demineralized. No pathologic calcifications. Partially distended urinary bladder.  IMPRESSION: No acute abnormalities. Electronically Signed   By: Lavonia Dana M.D.   On: 03/16/2017 13:34   US Abdomen Limited Ruq  Result Date: 03/14/2017 CLINICAL DATA:  Abdominal pain EXAM: ULTRASOUND ABDOMEN LIMITED RIGHT UPPER QUADRANT COMPARISON:  CT abdomen 03/13/2017 FINDINGS: Gallbladder: No gallstones or wall thickening visualized. No sonographic Murphy sign noted by sonographer. Common bile duct: Diameter: 5 mm Liver: Diffusely echogenic liver without focal lesion IMPRESSION: Negative for gallstones.  Fatty infiltration throughout the liver. Electronically Signed   By: Franchot Gallo M.D.   On: 03/14/2017 14:34    Subjective: Questioning about going home  Discharge Exam: Vitals:   03/18/17 1004 03/18/17 1413  BP: 132/86   Pulse: (!) 105   Resp: 18   Temp: 100 F (37.8 C) 98.6 F (37 C)   Vitals:   03/17/17 1900 03/18/17 0408 03/18/17 1004 03/18/17 1413  BP: 105/73 116/63 132/86   Pulse: (!) 102 95 (!) 105   Resp: 16 18 18    Temp: 98.3 F (36.8 C) 98.6 F (37 C) 100 F (37.8 C) 98.6 F (37 C)  TempSrc: Oral Oral Oral Oral  SpO2: 97% 97% 98%   Weight: 79.8 kg (175 lb 14.8 oz)     Height:        General: Pt is alert, awake, not in acute distress Cardiovascular: RRR, S1/S2 +, no rubs, no gallops Respiratory: CTA bilaterally, no wheezing, no rhonchi Abdominal: Soft, NT, ND, bowel sounds + Extremities: no edema, no cyanosis   The results of significant diagnostics from this hospitalization (including imaging, microbiology, ancillary and laboratory) are listed below for reference.     Microbiology: Recent Results (from the past 240 hour(s))  Blood Culture (routine x 2)     Status: None   Collection Time: 03/13/17  3:44 PM  Result Value Ref Range Status   Specimen Description BLOOD RIGHT FOREARM  Final   Special Requests  Final    BOTTLES DRAWN AEROBIC AND ANAEROBIC Blood Culture adequate volume   Culture NO GROWTH 5 DAYS  Final   Report Status 03/18/2017  FINAL  Final  Blood Culture (routine x 2)     Status: None   Collection Time: 03/13/17  3:51 PM  Result Value Ref Range Status   Specimen Description BLOOD RIGHT WRIST  Final   Special Requests   Final    BOTTLES DRAWN AEROBIC AND ANAEROBIC Blood Culture adequate volume   Culture NO GROWTH 5 DAYS  Final   Report Status 03/18/2017 FINAL  Final  MRSA PCR Screening     Status: None   Collection Time: 03/13/17  8:57 PM  Result Value Ref Range Status   MRSA by PCR NEGATIVE NEGATIVE Final    Comment:        The GeneXpert MRSA Assay (FDA approved for NASAL specimens only), is one component of a comprehensive MRSA colonization surveillance program. It is not intended to diagnose MRSA infection nor to guide or monitor treatment for MRSA infections.   Gastrointestinal Panel by PCR , Stool     Status: None   Collection Time: 03/14/17  7:29 PM  Result Value Ref Range Status   Campylobacter species NOT DETECTED NOT DETECTED Final   Plesimonas shigelloides NOT DETECTED NOT DETECTED Final   Salmonella species NOT DETECTED NOT DETECTED Final   Yersinia enterocolitica NOT DETECTED NOT DETECTED Final   Vibrio species NOT DETECTED NOT DETECTED Final   Vibrio cholerae NOT DETECTED NOT DETECTED Final   Enteroaggregative E coli (EAEC) NOT DETECTED NOT DETECTED Final   Enteropathogenic E coli (EPEC) NOT DETECTED NOT DETECTED Final   Enterotoxigenic E coli (ETEC) NOT DETECTED NOT DETECTED Final   Shiga like toxin producing E coli (STEC) NOT DETECTED NOT DETECTED Final   Shigella/Enteroinvasive E coli (EIEC) NOT DETECTED NOT DETECTED Final   Cryptosporidium NOT DETECTED NOT DETECTED Final   Cyclospora cayetanensis NOT DETECTED NOT DETECTED Final   Entamoeba histolytica NOT DETECTED NOT DETECTED Final   Giardia lamblia NOT DETECTED NOT DETECTED Final   Adenovirus F40/41 NOT DETECTED NOT DETECTED Final   Astrovirus NOT DETECTED NOT DETECTED Final   Norovirus GI/GII NOT DETECTED NOT DETECTED Final    Rotavirus A NOT DETECTED NOT DETECTED Final   Sapovirus (I, II, IV, and V) NOT DETECTED NOT DETECTED Final     Labs: BNP (last 3 results) No results for input(s): BNP in the last 8760 hours. Basic Metabolic Panel:  Recent Labs Lab 03/13/17 1430 03/14/17 0451 03/15/17 0105 03/17/17 0501 03/18/17 0430  NA 136 142 140 141 138  K 4.0 4.1 3.4* 3.4* 3.3*  CL 103 118* 110 104 102  CO2 11* 16* 22 27 27   GLUCOSE 184* 87 70 116* 149*  BUN 49* 39* 18 12 15   CREATININE 3.89* 2.53* 1.50* 1.14 1.17  CALCIUM 9.6 7.6* 8.4* 9.4 9.5   Liver Function Tests:  Recent Labs Lab 03/13/17 1537 03/13/17 1821 03/15/17 0105  AST 34 30 23  ALT 33 33 29  ALKPHOS 42 36* 42  BILITOT 0.6 0.5 1.1  PROT 6.8 5.7* 5.8*  ALBUMIN 3.9 3.2* 3.1*    Recent Labs Lab 03/13/17 1537 03/13/17 1821  LIPASE 74* 58*   No results for input(s): AMMONIA in the last 168 hours. CBC:  Recent Labs Lab 03/13/17 1430 03/14/17 0451 03/15/17 0105  WBC 22.9* 19.6* 9.9  NEUTROABS  --   --  5.5  HGB 14.1 11.9* 11.5*  HCT 41.8 36.3* 35.2*  MCV 85.1 86.4 86.7  PLT 298 276 166   Cardiac Enzymes:  Recent Labs Lab 03/13/17 1821 03/13/17 2231 03/14/17 0451  TROPONINI <0.03 <0.03 <0.03   BNP: Invalid input(s): POCBNP CBG:  Recent Labs Lab 03/15/17 2107 03/16/17 0738 03/16/17 1230 03/16/17 1610 03/16/17 2106  GLUCAP 125* 100* 93 92 132*   D-Dimer No results for input(s): DDIMER in the last 72 hours. Hgb A1c No results for input(s): HGBA1C in the last 72 hours. Lipid Profile No results for input(s): CHOL, HDL, LDLCALC, TRIG, CHOLHDL, LDLDIRECT in the last 72 hours. Thyroid function studies No results for input(s): TSH, T4TOTAL, T3FREE, THYROIDAB in the last 72 hours.  Invalid input(s): FREET3 Anemia work up No results for input(s): VITAMINB12, FOLATE, FERRITIN, TIBC, IRON, RETICCTPCT in the last 72 hours. Urinalysis    Component Value Date/Time   COLORURINE YELLOW 03/13/2017 2012    APPEARANCEUR CLEAR 03/13/2017 2012   LABSPEC 1.014 03/13/2017 2012   PHURINE 5.0 03/13/2017 2012   GLUCOSEU NEGATIVE 03/13/2017 2012   HGBUR NEGATIVE 03/13/2017 2012   BILIRUBINUR NEGATIVE 03/13/2017 2012   KETONESUR NEGATIVE 03/13/2017 2012   PROTEINUR NEGATIVE 03/13/2017 2012   NITRITE NEGATIVE 03/13/2017 2012   LEUKOCYTESUR NEGATIVE 03/13/2017 2012   Sepsis Labs Invalid input(s): PROCALCITONIN,  WBC,  LACTICIDVEN Microbiology Recent Results (from the past 240 hour(s))  Blood Culture (routine x 2)     Status: None   Collection Time: 03/13/17  3:44 PM  Result Value Ref Range Status   Specimen Description BLOOD RIGHT FOREARM  Final   Special Requests   Final    BOTTLES DRAWN AEROBIC AND ANAEROBIC Blood Culture adequate volume   Culture NO GROWTH 5 DAYS  Final   Report Status 03/18/2017 FINAL  Final  Blood Culture (routine x 2)     Status: None   Collection Time: 03/13/17  3:51 PM  Result Value Ref Range Status   Specimen Description BLOOD RIGHT WRIST  Final   Special Requests   Final    BOTTLES DRAWN AEROBIC AND ANAEROBIC Blood Culture adequate volume   Culture NO GROWTH 5 DAYS  Final   Report Status 03/18/2017 FINAL  Final  MRSA PCR Screening     Status: None   Collection Time: 03/13/17  8:57 PM  Result Value Ref Range Status   MRSA by PCR NEGATIVE NEGATIVE Final    Comment:        The GeneXpert MRSA Assay (FDA approved for NASAL specimens only), is one component of a comprehensive MRSA colonization surveillance program. It is not intended to diagnose MRSA infection nor to guide or monitor treatment for MRSA infections.   Gastrointestinal Panel by PCR , Stool     Status: None   Collection Time: 03/14/17  7:29 PM  Result Value Ref Range Status   Campylobacter species NOT DETECTED NOT DETECTED Final   Plesimonas shigelloides NOT DETECTED NOT DETECTED Final   Salmonella species NOT DETECTED NOT DETECTED Final   Yersinia enterocolitica NOT DETECTED NOT DETECTED Final    Vibrio species NOT DETECTED NOT DETECTED Final   Vibrio cholerae NOT DETECTED NOT DETECTED Final   Enteroaggregative E coli (EAEC) NOT DETECTED NOT DETECTED Final   Enteropathogenic E coli (EPEC) NOT DETECTED NOT DETECTED Final   Enterotoxigenic E coli (ETEC) NOT DETECTED NOT DETECTED Final   Shiga like toxin producing E coli (STEC) NOT DETECTED NOT DETECTED Final   Shigella/Enteroinvasive E coli (EIEC) NOT DETECTED NOT DETECTED Final   Cryptosporidium  NOT DETECTED NOT DETECTED Final   Cyclospora cayetanensis NOT DETECTED NOT DETECTED Final   Entamoeba histolytica NOT DETECTED NOT DETECTED Final   Giardia lamblia NOT DETECTED NOT DETECTED Final   Adenovirus F40/41 NOT DETECTED NOT DETECTED Final   Astrovirus NOT DETECTED NOT DETECTED Final   Norovirus GI/GII NOT DETECTED NOT DETECTED Final   Rotavirus A NOT DETECTED NOT DETECTED Final   Sapovirus (I, II, IV, and V) NOT DETECTED NOT DETECTED Final     SIGNED:   Rehaan Viloria, Orpah Melter, MD  [facility] 03/18/2017, 2:33 PM  If 7PM-7AM, please contact night-coverage [email or web address] Password [email or web address]

## 2017-03-20 DIAGNOSIS — M1711 Unilateral primary osteoarthritis, right knee: Secondary | ICD-10-CM | POA: Diagnosis not present

## 2017-03-24 DIAGNOSIS — G8929 Other chronic pain: Secondary | ICD-10-CM | POA: Diagnosis not present

## 2017-03-24 DIAGNOSIS — M25561 Pain in right knee: Secondary | ICD-10-CM | POA: Diagnosis not present

## 2017-03-24 DIAGNOSIS — M1711 Unilateral primary osteoarthritis, right knee: Secondary | ICD-10-CM | POA: Diagnosis not present

## 2017-03-25 DIAGNOSIS — M1711 Unilateral primary osteoarthritis, right knee: Secondary | ICD-10-CM | POA: Diagnosis not present

## 2017-03-25 DIAGNOSIS — E119 Type 2 diabetes mellitus without complications: Secondary | ICD-10-CM | POA: Diagnosis not present

## 2017-03-25 DIAGNOSIS — A419 Sepsis, unspecified organism: Secondary | ICD-10-CM | POA: Diagnosis not present

## 2017-03-31 DIAGNOSIS — M1711 Unilateral primary osteoarthritis, right knee: Secondary | ICD-10-CM | POA: Diagnosis not present

## 2017-03-31 DIAGNOSIS — M25561 Pain in right knee: Secondary | ICD-10-CM | POA: Diagnosis not present

## 2017-03-31 DIAGNOSIS — G8929 Other chronic pain: Secondary | ICD-10-CM | POA: Diagnosis not present

## 2017-04-13 ENCOUNTER — Emergency Department (HOSPITAL_COMMUNITY)
Admission: EM | Admit: 2017-04-13 | Discharge: 2017-04-14 | Disposition: A | Discharge status: Other Acute Inpatient Hospital | Payer: 59 | Attending: Emergency Medicine | Admitting: Emergency Medicine

## 2017-04-13 ENCOUNTER — Encounter (HOSPITAL_COMMUNITY): Payer: Self-pay | Admitting: Emergency Medicine

## 2017-04-13 DIAGNOSIS — F259 Schizoaffective disorder, unspecified: Secondary | ICD-10-CM

## 2017-04-13 DIAGNOSIS — I10 Essential (primary) hypertension: Secondary | ICD-10-CM | POA: Insufficient documentation

## 2017-04-13 DIAGNOSIS — Z7984 Long term (current) use of oral hypoglycemic drugs: Secondary | ICD-10-CM | POA: Diagnosis not present

## 2017-04-13 DIAGNOSIS — R45851 Suicidal ideations: Secondary | ICD-10-CM | POA: Insufficient documentation

## 2017-04-13 DIAGNOSIS — Z7982 Long term (current) use of aspirin: Secondary | ICD-10-CM | POA: Diagnosis not present

## 2017-04-13 DIAGNOSIS — Z720 Tobacco use: Secondary | ICD-10-CM | POA: Diagnosis not present

## 2017-04-13 DIAGNOSIS — Z79899 Other long term (current) drug therapy: Secondary | ICD-10-CM | POA: Diagnosis not present

## 2017-04-13 LAB — RAPID URINE DRUG SCREEN, HOSP PERFORMED
Amphetamines: NOT DETECTED
Barbiturates: NOT DETECTED
Benzodiazepines: POSITIVE — AB
Cocaine: NOT DETECTED
Opiates: NOT DETECTED
Tetrahydrocannabinol: NOT DETECTED

## 2017-04-13 LAB — COMPREHENSIVE METABOLIC PANEL
ALT: 40 U/L (ref 17–63)
AST: 25 U/L (ref 15–41)
Albumin: 4.6 g/dL (ref 3.5–5.0)
Alkaline Phosphatase: 57 U/L (ref 38–126)
Anion gap: 14 (ref 5–15)
BUN: 26 mg/dL — ABNORMAL HIGH (ref 6–20)
CO2: 26 mmol/L (ref 22–32)
Calcium: 10 mg/dL (ref 8.9–10.3)
Chloride: 101 mmol/L (ref 101–111)
Creatinine, Ser: 1.27 mg/dL — ABNORMAL HIGH (ref 0.61–1.24)
GFR calc Af Amer: >60 mL/min (ref >60)
GFR calc non Af Amer: >60 mL/min (ref >60)
Glucose, Bld: 155 mg/dL — ABNORMAL HIGH (ref 65–99)
Potassium: 3.1 mmol/L — ABNORMAL LOW (ref 3.5–5.1)
Sodium: 141 mmol/L (ref 135–145)
Total Bilirubin: 0.7 mg/dL (ref 0.3–1.2)
Total Protein: 8.3 g/dL — ABNORMAL HIGH (ref 6.5–8.1)

## 2017-04-13 LAB — CBC
HCT: 46.2 % (ref 39.0–52.0)
Hemoglobin: 15.9 g/dL (ref 13.0–17.0)
MCH: 29.6 pg (ref 26.0–34.0)
MCHC: 34.4 g/dL (ref 30.0–36.0)
MCV: 85.9 fL (ref 78.0–100.0)
Platelets: 254 10*3/uL (ref 150–400)
RBC: 5.38 MIL/uL (ref 4.22–5.81)
RDW: 13.4 % (ref 11.5–15.5)
WBC: 16.8 10*3/uL — ABNORMAL HIGH (ref 4.0–10.5)

## 2017-04-13 LAB — ETHANOL: Alcohol, Ethyl (B): <5 mg/dL (ref <5)

## 2017-04-13 LAB — ACETAMINOPHEN LEVEL: Acetaminophen (Tylenol), Serum: <10 ug/mL — ABNORMAL LOW (ref 10–30)

## 2017-04-13 LAB — SALICYLATE LEVEL: Salicylate Lvl: <7 mg/dL (ref 2.8–30.0)

## 2017-04-13 MED ORDER — POTASSIUM CHLORIDE CRYS ER 20 MEQ PO TBCR
40.0000 meq | EXTENDED_RELEASE_TABLET | Freq: Once | ORAL | Status: AC
  Administered 2017-04-14: 40 meq via ORAL
  Filled 2017-04-13: qty 2

## 2017-04-13 MED ORDER — MAGNESIUM OXIDE 400 (241.3 MG) MG PO TABS
400.0000 mg | ORAL_TABLET | Freq: Every day | ORAL | Status: DC
  Administered 2017-04-14: 400 mg via ORAL
  Filled 2017-04-13 (×2): qty 1

## 2017-04-13 NOTE — ED Triage Notes (Addendum)
Pt is having auditory/visual hallucinations x 2 weeks and he states he has thoughts about wanting to hurt himself. Pt is tearful in triage.

## 2017-04-13 NOTE — ED Notes (Signed)
Pt with hx of schizo affective do, reports visual hallucinations for several weeks   Pt has no phych nor therapist   Previous admit years ago for same  Dr Gerarda Fraction is his PCP

## 2017-04-13 NOTE — ED Provider Notes (Signed)
The pt has hx of psych admissions in the past Travis [name]) - now has had recurrent psychosis with hallucinations and paranoia for the last 10 days.  He was admitted to the hospital in May of 2018, during which stay he was thought to be septic from a GI source - was d/c on abx - had a subsequent septic arthritis and has finished 2 courses of antibiotics most recently finishing Keflex 10 days ago about the same time that he started having paranoia - states that he is suicidal with a plan - to drink bleach or stab himself in the stomach - he has had feelings that the dog is staring at him - he is hearing voices - he states "no one can help me".  He has no physical complaint including his knee which he feels is back to normal.  I have completed IVC papers - he will need formal evaluation and admission to psychiatric hospital.  At change of shift - disposition pending - care signed out to Dr. Tyrone [name]   Medical screening examination/treatment/procedure(s) were conducted as a shared visit with non-physician practitioner(s) and myself.  I personally evaluated the patient during the encounter.  Clinical Impression:   Final diagnoses:  Suicidal ideation  Schizoaffective disorder, unspecified type ([place])         Noemi Chapel, MD 04/13/17 [phone]

## 2017-04-13 NOTE — ED Provider Notes (Signed)
[facility] DEPT Provider Note   CSN: [id number] Arrival date & time: 04/13/17  1958     History   Chief Complaint Chief Complaint  Patient presents with  . V70.1    HPI  Travis [name] is a 56 y.o. Male with a remote history of psychiatric hospitalization, who presents to the ED complaining of auditory and visual hallucinations for 2 weeks. Patient admits to active suicidal ideation with a plan to either drink bleach or stab himself in the stomach. He reports he has had these thoughts off and on for weeks, but that they are becoming more persistent. Patient states that he sees "all kinds of things" and "just knows something bad is going to happen and someone is coming to get himself and his wife". Patient unable to provide specific information on what he sees. Patient initially denies auditory hallucinations, but later reports he "hears people saying they will burn down his house. The patient is tearful throughout the encounter and frequently stops the conversation with an outburst saying, "I didn't do anything I promise". He denies any specific psychiatric history, but does report many years ago he was hospitalized to "get some help". Patient denies taking any medications or substances in an attempt to end his life before coming here today. Denies current substances abuse, alcohol use, reports he does use smokeless tobacco daily.  Patient's wife is present and reports ever since a hospitalization for an abdominal infection in late July he has not been the same. She reports he is often tearful when she is home with him, but that he has gotten progressively worse, and had started to talk about hurting himself. She reports he has become increasingly paranoid over the past few weeks. She reports she is unaware of previous psychiatric issues, but they have only been together for the past 6 years. She states that he had wisdom teeth removed on Friday morning and she has been with him at all times  since then and does not think he has taken anything or made a suicide attempt at this time. She endorses concern for his safety at home, and reports he does have access to guns in the home. She reports only current health concern is constipation since dental surgery Friday.      Past Medical History:  Diagnosis Date  . Dyslipidemia   . Fatigue    NAUSEA,BLOATING  . H. pylori infection 09/15/2006  . Hematochezia 08/30/2007  . History of nuclear stress test 04/03/2009   exercise myoview; normal pattern of perfusion; low risk scan   . Hypertension    treated  . NIDDM (non-insulin dependent diabetes mellitus) 2011   type 2  . Schizoaffective disorder ([place])   . Tobacco chew use   . Unspecified vitamin D deficiency     Patient Active Problem List   Diagnosis Date Noted  . AKI (acute kidney injury) ([place])   . Lactic acid acidosis   . Left sided numbness   . Sepsis ([place])   . Elevated lactic acid level   . Leukocytosis   . Volume depletion   . Hypotension 03/13/2017  . Chest pain with moderate risk for cardiac etiology 01/13/2014  . Dyspnea 01/13/2014  . Family history of coronary artery disease- (mother had MI 32) 01/13/2014  . Tobacco chew use   . NIDDM (non-insulin dependent diabetes mellitus)   . Dyslipidemia   . Unspecified vitamin D deficiency   . 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 ESOPHAGEAL REFLUX 09/28/2007  .  DYSPHAGIA UNSPECIFIED 09/28/2007  . BLOOD IN STOOL, OCCULT 09/28/2007  . HERNIORRHAPHY, HX OF 09/28/2007    Past Surgical History:  Procedure Laterality Date  . APPENDECTOMY    . HERNIA REPAIR     RIGHT  . KNEE SURGERY     BOTH  . TRANSTHORACIC ECHOCARDIOGRAM  04/03/2009   EF=>55%; trace TR, mild pulm regurg       Home Medications    Prior to Admission medications   Medication Sig Start Date End Date Taking? Authorizing Provider  amoxicillin-clavulanate (AUGMENTIN) 875-125 MG tablet Take 1 tablet by mouth every 12 (twelve) hours.  03/18/17   Donne Hazel, MD  aspirin EC 81 MG tablet Take 81 mg by mouth daily.    [provider]  diltiazem (DILACOR XR) 240 MG 24 hr capsule Take 240 mg by mouth daily.    [provider]  glipiZIDE (GLUCOTROL XL) 10 MG 24 hr tablet Take 10 mg by mouth 2 (two) times daily.     [provider]  indomethacin (INDOCIN) 25 MG capsule Take 50 mg by mouth 2 (two) times daily with a meal.     [provider]  loratadine (CLARITIN) 10 MG tablet Take 10 mg by mouth daily.    [provider]  metFORMIN (GLUCOPHAGE) 1000 MG tablet Take 1,000 mg by mouth 2 (two) times daily with a meal.     [provider]  metoCLOPramide (REGLAN) 5 MG tablet Take 1 tablet (5 mg total) by mouth 3 (three) times daily before meals. 03/18/17 03/18/18  Donne Hazel, MD  nitroGLYCERIN (NITROSTAT) 0.4 MG SL tablet Place 0.4 mg under the tongue every 5 (five) minutes as needed for chest pain.    [provider]  omeprazole (PRILOSEC) 40 MG capsule Take 40 mg by mouth daily.    [provider]  pantoprazole (PROTONIX) 40 MG tablet Take 1 tablet (40 mg total) by mouth daily. 03/19/17   Donne Hazel, MD  saccharomyces boulardii (FLORASTOR) 250 MG capsule Take 1 capsule (250 mg total) by mouth 2 (two) times daily. 03/18/17   Donne Hazel, MD  tamsulosin (FLOMAX) 0.4 MG CAPS capsule Take 0.4 mg by mouth daily after breakfast.    [provider]  traZODone (DESYREL) 100 MG tablet Take 100 mg by mouth at bedtime.     [provider]    Family History Family History  Problem Relation Age of Onset  . Stroke Father 7  . Pneumonia Father   . Heart failure Mother 28  . Coronary artery disease Mother        also MI  . Heart attack Mother   . Heart attack Maternal Uncle        died in 87s  . Cancer Unknown        x2; maternal side  . Diabetes Maternal Grandmother     Social History Social History  Substance Use Topics  . Smoking  status: Never Smoker  . Smokeless tobacco: Current User    Types: Snuff, Chew     Comment: started chewing tobacco at age 42 years old-stopped chewing and dips snuff only  . Alcohol use No     Allergies   Doxycycline and Lisinopril   Review of Systems Review of Systems  Constitutional: Negative for activity change, appetite change, chills, diaphoresis, fatigue and fever.  HENT: Negative for congestion, ear pain, rhinorrhea, sinus pain and sore throat.   Eyes: Negative for photophobia and pain.  Respiratory: Negative  for cough, chest tightness and shortness of breath.   Cardiovascular: Negative for chest pain, palpitations and leg swelling.  Gastrointestinal: Positive for constipation. Negative for abdominal distention, abdominal pain, diarrhea, nausea and vomiting.  Genitourinary: Negative for difficulty urinating and dysuria.  Musculoskeletal: Negative for arthralgias and myalgias.  Skin: Negative for rash and wound.  Neurological: Negative for dizziness, seizures, syncope, weakness, numbness and headaches.  Psychiatric/Behavioral: Positive for dysphoric mood, hallucinations and suicidal ideas.     Physical Exam Updated Vital Signs BP (!) 163/115 (BP Location: Right Arm)   Pulse (!) 121   Temp 98.5 F (36.9 C) (Oral)   Resp 18   Ht 5\' 6"  (1.676 m)   Wt 84.4 kg (186 lb)   SpO2 98%   BMI 30.02 kg/m   Physical Exam  Constitutional: He appears well-developed and well-nourished. No distress.  HENT:  Head: Normocephalic and atraumatic.  Mouth/Throat: Oropharynx is clear and moist.  Eyes: Pupils are equal, round, and reactive to light. EOM are normal.  Neck: Normal range of motion. Neck supple. No JVD present. No tracheal deviation present.  Cardiovascular: Normal rate, regular rhythm, normal heart sounds and intact distal pulses.   Pulmonary/Chest: Effort normal and breath sounds normal. No respiratory distress.  Abdominal: Soft. Bowel sounds are normal. He exhibits no  distension and no mass. There is no tenderness. There is no guarding.  Musculoskeletal: He exhibits no edema or deformity.  Lymphadenopathy:    He has no cervical adenopathy.  Neurological: He is alert. No cranial nerve deficit.  Strength 5/5 in all extremities  Skin: Skin is warm and dry. Capillary refill takes 2 to 3 seconds. No rash noted. He is not diaphoretic. No erythema.  Psychiatric: He is actively hallucinating. He is not withdrawn. Thought content is paranoid. He exhibits a depressed mood. He expresses suicidal ideation.  Patient tearful throughout exam     ED Treatments / Results  Labs (all labs ordered are listed, but only abnormal results are displayed) Labs Reviewed  COMPREHENSIVE METABOLIC PANEL - Abnormal; Notable for the following:       Result Value   Potassium 3.1 (*)    Glucose, Bld 155 (*)    BUN 26 (*)    Creatinine, Ser 1.27 (*)    Total Protein 8.3 (*)    All other components within normal limits  ACETAMINOPHEN LEVEL - Abnormal; Notable for the following:    Acetaminophen (Tylenol), Serum <10 (*)    All other components within normal limits  CBC - Abnormal; Notable for the following:    WBC 16.8 (*)    All other components within normal limits  RAPID URINE DRUG SCREEN, HOSP PERFORMED - Abnormal; Notable for the following:    Benzodiazepines POSITIVE (*)    All other components within normal limits  ETHANOL  SALICYLATE LEVEL    EKG  EKG Interpretation  Date/Time:  Sunday April 13 2017 21:15:53 EDT Ventricular Rate:  109 PR Interval:  126 QRS Duration: 90 QT Interval:  350 QTC Calculation: 471 R Axis:   36 Text Interpretation:  Sinus tachycardia Otherwise normal ECG Since last tracing rate faster Confirmed by Noemi Chapel [phone]) on 04/13/2017 11:32:40 PM       Radiology No results found.  Procedures Procedures (including critical care time)  Medications Ordered in ED Medications  potassium chloride SA (K-DUR,KLOR-CON) CR tablet 40  mEq (not administered)  magnesium oxide (MAG-OX) tablet 400 mg (not administered)     Initial Impression / Assessment and Plan /  ED Course  I have reviewed the triage vital signs and the nursing notes.  Pertinent labs & imaging results that were available during my care of the patient were reviewed by me and considered in my medical decision making (see chart for details).  Patient seen and examined, presenting with hallucinations and suicidal ideation with plan. Patient is an imminent danger to himself and will be IVC'd. The patient is calm and redirectable. Labs ordered to confirm medical clearance. Episodes started after receiving multiple antibiotics for abdominal infection. Psychiatry consulted.  Final Clinical Impressions(s) / ED Diagnoses   Final diagnoses:  Suicidal ideation  Schizoaffective disorder, unspecified type [facility])    New Prescriptions New Prescriptions   No medications on file     Janet Berlin 04/13/17 [id number]    Noemi Chapel, MD 04/13/17 [phone]

## 2017-04-14 ENCOUNTER — Encounter (HOSPITAL_COMMUNITY): Payer: Self-pay | Admitting: Mental Health

## 2017-04-14 ENCOUNTER — Inpatient Hospital Stay (HOSPITAL_COMMUNITY): Admission: AD | Admit: 2017-04-14 | Payer: 59 | Admitting: Psychiatry

## 2017-04-14 ENCOUNTER — Encounter (HOSPITAL_COMMUNITY): Payer: Self-pay | Admitting: *Deleted

## 2017-04-14 ENCOUNTER — Inpatient Hospital Stay (HOSPITAL_COMMUNITY)
Admission: AD | Admit: 2017-04-14 | Discharge: 2017-04-16 | DRG: 885 | Disposition: A | Discharge status: Other Acute Inpatient Hospital | Payer: 59 | Attending: Internal Medicine | Admitting: Internal Medicine

## 2017-04-14 DIAGNOSIS — Z79891 Long term (current) use of opiate analgesic: Secondary | ICD-10-CM | POA: Diagnosis not present

## 2017-04-14 DIAGNOSIS — E1169 Type 2 diabetes mellitus with other specified complication: Secondary | ICD-10-CM | POA: Diagnosis not present

## 2017-04-14 DIAGNOSIS — I10 Essential (primary) hypertension: Secondary | ICD-10-CM | POA: Diagnosis present

## 2017-04-14 DIAGNOSIS — E785 Hyperlipidemia, unspecified: Secondary | ICD-10-CM | POA: Diagnosis present

## 2017-04-14 DIAGNOSIS — E1122 Type 2 diabetes mellitus with diabetic chronic kidney disease: Secondary | ICD-10-CM | POA: Diagnosis not present

## 2017-04-14 DIAGNOSIS — N390 Urinary tract infection, site not specified: Secondary | ICD-10-CM | POA: Diagnosis not present

## 2017-04-14 DIAGNOSIS — R443 Hallucinations, unspecified: Secondary | ICD-10-CM | POA: Diagnosis not present

## 2017-04-14 DIAGNOSIS — F419 Anxiety disorder, unspecified: Secondary | ICD-10-CM | POA: Diagnosis present

## 2017-04-14 DIAGNOSIS — N289 Disorder of kidney and ureter, unspecified: Secondary | ICD-10-CM | POA: Diagnosis not present

## 2017-04-14 DIAGNOSIS — F333 Major depressive disorder, recurrent, severe with psychotic symptoms: Secondary | ICD-10-CM | POA: Diagnosis present

## 2017-04-14 DIAGNOSIS — R200 Anesthesia of skin: Secondary | ICD-10-CM | POA: Diagnosis not present

## 2017-04-14 DIAGNOSIS — N179 Acute kidney failure, unspecified: Secondary | ICD-10-CM | POA: Diagnosis not present

## 2017-04-14 DIAGNOSIS — F259 Schizoaffective disorder, unspecified: Secondary | ICD-10-CM | POA: Diagnosis present

## 2017-04-14 DIAGNOSIS — R45851 Suicidal ideations: Secondary | ICD-10-CM | POA: Diagnosis present

## 2017-04-14 DIAGNOSIS — R450 Nervousness: Secondary | ICD-10-CM | POA: Diagnosis not present

## 2017-04-14 DIAGNOSIS — F17210 Nicotine dependence, cigarettes, uncomplicated: Secondary | ICD-10-CM | POA: Diagnosis present

## 2017-04-14 DIAGNOSIS — Z9114 Patient's other noncompliance with medication regimen: Secondary | ICD-10-CM

## 2017-04-14 DIAGNOSIS — E119 Type 2 diabetes mellitus without complications: Secondary | ICD-10-CM | POA: Diagnosis present

## 2017-04-14 DIAGNOSIS — I87309 Chronic venous hypertension (idiopathic) without complications of unspecified lower extremity: Secondary | ICD-10-CM | POA: Diagnosis present

## 2017-04-14 DIAGNOSIS — E872 Acidosis: Secondary | ICD-10-CM | POA: Diagnosis not present

## 2017-04-14 DIAGNOSIS — F39 Unspecified mood [affective] disorder: Secondary | ICD-10-CM | POA: Diagnosis not present

## 2017-04-14 DIAGNOSIS — E876 Hypokalemia: Secondary | ICD-10-CM | POA: Diagnosis present

## 2017-04-14 DIAGNOSIS — G4700 Insomnia, unspecified: Secondary | ICD-10-CM | POA: Diagnosis present

## 2017-04-14 DIAGNOSIS — F250 Schizoaffective disorder, bipolar type: Secondary | ICD-10-CM | POA: Diagnosis not present

## 2017-04-14 DIAGNOSIS — Z881 Allergy status to other antibiotic agents status: Secondary | ICD-10-CM

## 2017-04-14 DIAGNOSIS — Z7984 Long term (current) use of oral hypoglycemic drugs: Secondary | ICD-10-CM

## 2017-04-14 DIAGNOSIS — Z8249 Family history of ischemic heart disease and other diseases of the circulatory system: Secondary | ICD-10-CM | POA: Diagnosis not present

## 2017-04-14 DIAGNOSIS — Z720 Tobacco use: Secondary | ICD-10-CM | POA: Diagnosis not present

## 2017-04-14 DIAGNOSIS — Z7982 Long term (current) use of aspirin: Secondary | ICD-10-CM | POA: Diagnosis not present

## 2017-04-14 DIAGNOSIS — M542 Cervicalgia: Secondary | ICD-10-CM | POA: Diagnosis not present

## 2017-04-14 DIAGNOSIS — K219 Gastro-esophageal reflux disease without esophagitis: Secondary | ICD-10-CM | POA: Diagnosis not present

## 2017-04-14 DIAGNOSIS — Z560 Unemployment, unspecified: Secondary | ICD-10-CM | POA: Diagnosis not present

## 2017-04-14 DIAGNOSIS — Z79899 Other long term (current) drug therapy: Secondary | ICD-10-CM | POA: Diagnosis not present

## 2017-04-14 DIAGNOSIS — F29 Unspecified psychosis not due to a substance or known physiological condition: Secondary | ICD-10-CM | POA: Diagnosis not present

## 2017-04-14 DIAGNOSIS — R1310 Dysphagia, unspecified: Secondary | ICD-10-CM | POA: Diagnosis present

## 2017-04-14 DIAGNOSIS — Z888 Allergy status to other drugs, medicaments and biological substances status: Secondary | ICD-10-CM | POA: Diagnosis not present

## 2017-04-14 DIAGNOSIS — Z833 Family history of diabetes mellitus: Secondary | ICD-10-CM

## 2017-04-14 DIAGNOSIS — I129 Hypertensive chronic kidney disease with stage 1 through stage 4 chronic kidney disease, or unspecified chronic kidney disease: Secondary | ICD-10-CM | POA: Diagnosis not present

## 2017-04-14 DIAGNOSIS — F17220 Nicotine dependence, chewing tobacco, uncomplicated: Secondary | ICD-10-CM | POA: Diagnosis not present

## 2017-04-14 LAB — GLUCOSE, CAPILLARY: Glucose-Capillary: 123 mg/dL — ABNORMAL HIGH (ref 65–99)

## 2017-04-14 MED ORDER — METFORMIN HCL 500 MG PO TABS
1000.0000 mg | ORAL_TABLET | Freq: Every day | ORAL | Status: DC
  Administered 2017-04-15 – 2017-04-16 (×2): 1000 mg via ORAL
  Filled 2017-04-14 (×5): qty 2

## 2017-04-14 MED ORDER — LORATADINE 10 MG PO TABS
10.0000 mg | ORAL_TABLET | Freq: Every day | ORAL | Status: DC
  Administered 2017-04-14 – 2017-04-16 (×3): 10 mg via ORAL
  Filled 2017-04-14 (×6): qty 1

## 2017-04-14 MED ORDER — HYDROXYZINE HCL 25 MG PO TABS: 25.0000 mg | ORAL_TABLET | Freq: Three times a day (TID) | ORAL | Status: DC | PRN

## 2017-04-14 MED ORDER — NICOTINE POLACRILEX 2 MG MT GUM: 2.0000 mg | CHEWING_GUM | OROMUCOSAL | Status: DC | PRN

## 2017-04-14 MED ORDER — TAMSULOSIN HCL 0.4 MG PO CAPS
0.4000 mg | ORAL_CAPSULE | Freq: Every day | ORAL | Status: DC
  Administered 2017-04-15 – 2017-04-16 (×2): 0.4 mg via ORAL
  Filled 2017-04-14 (×4): qty 1

## 2017-04-14 MED ORDER — INDOMETHACIN 25 MG PO CAPS
50.0000 mg | ORAL_CAPSULE | Freq: Two times a day (BID) | ORAL | Status: DC
  Administered 2017-04-14 – 2017-04-16 (×4): 50 mg via ORAL
  Filled 2017-04-14 (×2): qty 1
  Filled 2017-04-14: qty 2
  Filled 2017-04-14: qty 1
  Filled 2017-04-14: qty 2
  Filled 2017-04-14: qty 1
  Filled 2017-04-14: qty 2
  Filled 2017-04-14: qty 1
  Filled 2017-04-14: qty 2
  Filled 2017-04-14: qty 1
  Filled 2017-04-14: qty 2

## 2017-04-14 MED ORDER — TRAZODONE HCL 100 MG PO TABS
100.0000 mg | ORAL_TABLET | Freq: Every day | ORAL | Status: DC
  Administered 2017-04-15: 100 mg via ORAL
  Filled 2017-04-14 (×4): qty 1

## 2017-04-14 MED ORDER — AMLODIPINE BESYLATE 10 MG PO TABS
10.0000 mg | ORAL_TABLET | Freq: Every day | ORAL | Status: DC
  Administered 2017-04-14 – 2017-04-16 (×3): 10 mg via ORAL
  Filled 2017-04-14 (×6): qty 1

## 2017-04-14 MED ORDER — PANTOPRAZOLE SODIUM 40 MG PO TBEC
40.0000 mg | DELAYED_RELEASE_TABLET | Freq: Every day | ORAL | Status: DC
  Administered 2017-04-14 – 2017-04-16 (×3): 40 mg via ORAL
  Filled 2017-04-14 (×6): qty 1

## 2017-04-14 MED ORDER — ACETAMINOPHEN 325 MG PO TABS: 650.0000 mg | ORAL_TABLET | Freq: Four times a day (QID) | ORAL | Status: DC | PRN

## 2017-04-14 MED ORDER — NITROGLYCERIN 0.4 MG SL SUBL: 0.4000 mg | SUBLINGUAL_TABLET | SUBLINGUAL | Status: DC | PRN

## 2017-04-14 MED ORDER — GLIPIZIDE ER 10 MG PO TB24
10.0000 mg | ORAL_TABLET | Freq: Every day | ORAL | Status: DC
  Administered 2017-04-15 – 2017-04-16 (×2): 10 mg via ORAL
  Filled 2017-04-14 (×4): qty 1

## 2017-04-14 MED ORDER — ASPIRIN EC 81 MG PO TBEC
81.0000 mg | DELAYED_RELEASE_TABLET | Freq: Every day | ORAL | Status: DC
  Administered 2017-04-14 – 2017-04-16 (×3): 81 mg via ORAL
  Filled 2017-04-14 (×6): qty 1

## 2017-04-14 NOTE — Tx Team (Signed)
Initial Treatment Plan 04/14/2017 10:13 PM Travis [name] JSE:[id number]    PATIENT STRESSORS: Other: Psychosis   PATIENT STRENGTHS: Communication skills Motivation for treatment/growth Supportive family/friends   PATIENT IDENTIFIED PROBLEMS: Psychosis  "Be safe enough to get out and go places"  "I don't know"                 DISCHARGE CRITERIA:  Ability to meet basic life and health needs Improved stabilization in mood, thinking, and/or behavior Motivation to continue treatment in a less acute level of care Need for constant or close observation no longer present Verbal commitment to aftercare and medication compliance  PRELIMINARY DISCHARGE PLAN: Outpatient therapy Return to previous living arrangement Return to previous work or school arrangements  PATIENT/FAMILY INVOLVEMENT: This treatment plan has been presented to and reviewed with the patient, Travis [name].  The patient and family have been given the opportunity to ask questions and make suggestions.  Dustin Flock, RN 04/14/2017, 10:13 PM

## 2017-04-14 NOTE — Progress Notes (Signed)
Per Letitia Libra , [facility], patient has been accepted to [facility], bed 508-2 ; Accepting provider is Lindon Romp, [place]; Attending provider is Dr. Shea Evans.   Patient can arrive at 2:00pm. Number for report is [phone].    Cena Benton, RN notified.   Radonna Ricker MSW, [place] [facility] [phone]

## 2017-04-14 NOTE — Progress Notes (Signed)
Adult Psychoeducational Group Note  Date:  04/14/2017 Time:  8:44 PM  Group Topic/Focus:  Wrap-Up Group:   The focus of this group is to help patients review their daily goal of treatment and discuss progress on daily workbooks.  Participation Level:  Active  Participation Quality:  Appropriate  Affect:  Appropriate  Cognitive:  Appropriate  Insight: Appropriate  Engagement in Group:  Engaged  Modes of Intervention:  Discussion  Additional Comments: The patient expressed that he attended groups.  Nash Shearer 04/14/2017, 8:44 PM

## 2017-04-14 NOTE — ED Notes (Signed)
This nurse made rounding on patient. He is paranoid this morning. States he doesn't want a breakfast tray because he "thinks they are going to kill me."  Pt ensured that the breakfast was prepared safely and tray was placed at bedside.

## 2017-04-14 NOTE — ED Notes (Signed)
Pt only drank half of orange juice. States he doesn't want to eat.

## 2017-04-14 NOTE — ED Notes (Signed)
Report given to Dan RN.

## 2017-04-14 NOTE — Progress Notes (Signed)
Admission Note:  56 year old male who presents IVC, in no acute distress, for the treatment of SI and Paranoia.  Patient reports SI with a plan to "drink bleach".  Patient displays paranoia on admission and states "People aren't going to shoot me in the head here are they"?  Patient made references, questioning his safety, multiple times during the admission.  Patient appears anxious and fearful on admission. Patient was cooperative with admission process. Patient presents with passive SI and contracts for safety upon admission. Patient reports auditory and visual hallucinations.  Patient verbalized fear that wife is going to leave him.  Patient reports that wife has made no indication of her leaving him or being upset with him about anything.  Patient believes that his wife is going to leave him and that he will be homeless therefore he is afraid to make contact with wife.  Patient identifies "financial issues" as an additional stressor.  Patient reports previous SI attempt "a couple of weeks ago" and states "I took a bunch of pills".  Patient currently lives with his wife and identifies his wife as his support system "if I even still have a wife".  While at [facility], patient would like to "be safe enough to get out and go places".  Skin was assessed and found to be clear of any abnormal marks.  Patient searched and no contraband found, POC and unit policies explained and understanding verbalized. Consents obtained. Food and fluids offered, and fluids accepted. Patient had no additional questions or concerns.

## 2017-04-14 NOTE — ED Notes (Signed)
Pt still reserved and paranoid. Pt asks, "when will these thoughts quit. She won't get hurt will she (referring to wife)."

## 2017-04-14 NOTE — ED Notes (Signed)
Pt has family at bedside

## 2017-04-14 NOTE — ED Notes (Signed)
Pt is awake at this time. States he will eat later. Pt states his wife will come at 0830 to see him.

## 2017-04-14 NOTE — BH Assessment (Addendum)
Tele Assessment Note   Travis [name] is an 56 y.o. male, who presents involuntary and accompanied by his wife to [place]. Clinician asked the pt, "what brought you to the hospital?" Pt reported, "I wanted to hurt myself- drink bleach." Pt's wife reported, "he was in the hospital for a few days a month ago for septic shock, it has progressed." Pt's wife reported, the pt is paranoid-thinking people will not let him out of his house and people are watching him through the TV. Clinician asked the if he experienced audio/visual hallucinations, pt replied, "people are gonna hurt me." Pt's wife reported, she noted the pt researched, what would happen if he drank bleach. Pt's wife reported, the pt has a black powder rifle for deer hunting. Pt denied, HI, and self-injurious behaviors.   Pt was IVC's by Dr. Sabra [name]. Per IVC paperwork: "Patient reported >2 week history of visual hallucinations and reports acute suicidal ideation with plan."   Pt denied abuse. Pt reported using loose tobacco. Pt's UDS is positive for benzodiazepines. Pt denied being linked to OPT resources (medication management and/or counseling.) Pt reported, previous inpatient admissions but could not recall where and when he was admitted.   Pt presented quiet/awake while his wife held his arm in scrubs, with logical/coherent speech. Pt's eye contact was fair. Pt's mood was preoccupied. Pt's affect was anxious. Pt's thought process was coherent/relelvant. Pt's judgement was impaired. Pt's concentration was fair. Pt's insight was poor. Pt's impulse control was fair. Pt was oriented x3 (year, city and state.) Pt's wife reported, she felt the pt wouldn't be safe at home.   Diagnosis: Schizoaffective Disorder [facility])   Past Medical History:  Past Medical History:  Diagnosis Date  . Dyslipidemia   . Fatigue    NAUSEA,BLOATING  . H. pylori infection 09/15/2006  . Hematochezia 08/30/2007  . History of nuclear stress test 04/03/2009   exercise  myoview; normal pattern of perfusion; low risk scan   . Hypertension    treated  . NIDDM (non-insulin dependent diabetes mellitus) 2011   type 2  . Schizoaffective disorder ([place])   . Tobacco chew use   . Unspecified vitamin D deficiency     Past Surgical History:  Procedure Laterality Date  . APPENDECTOMY    . HERNIA REPAIR     RIGHT  . KNEE SURGERY     BOTH  . TRANSTHORACIC ECHOCARDIOGRAM  04/03/2009   EF=>55%; trace TR, mild pulm regurg    Family History:  Family History  Problem Relation Age of Onset  . Stroke Father 73  . Pneumonia Father   . Heart failure Mother 60  . Coronary artery disease Mother        also MI  . Heart attack Mother   . Heart attack Maternal Uncle        died in 59s  . Cancer Unknown        x2; maternal side  . Diabetes Maternal Grandmother     Social History:  reports that he has never smoked. His smokeless tobacco use includes Snuff and Chew. He reports that he does not drink alcohol or use drugs.  Additional Social History:  Alcohol / Drug Use Pain Medications: See MAR Prescriptions: See MAR Over the Counter: See MAR History of alcohol / drug use?: Yes Substance #1 Name of Substance 1: Tobacco  1 - Age of First Use: UTA 1 - Amount (size/oz): Pt reported, loose tobacco.  1 - Frequency: UTA 1 - Duration: UTA 1 -  Last Use / Amount: UTA  CIWA: CIWA-Ar BP: (!) 138/93 Pulse Rate: 100 COWS:    PATIENT STRENGTHS: (choose at least two) Average or above average intelligence Supportive family/friends  Allergies:  Allergies  Allergen Reactions  . Doxycycline Other (See Comments)    Reaction:  Blisters   . Lisinopril Swelling    Home Medications:  (Not in a hospital admission)  OB/GYN Status:  No LMP for male patient.  General Assessment Data Location of Assessment: [facility] TTS Assessment: In system Is this a Tele or Face-to-Face Assessment?: Tele Assessment Is this an Initial Assessment or a Re-assessment for this encounter?:  Initial Assessment Marital status: Married Living Arrangements: Spouse/significant other Can pt return to current living arrangement?: Yes Admission Status: Involuntary Referral Source: Self/Family/Friend Insurance type: [facility]     Crisis Care Plan Living Arrangements: Spouse/significant other Legal Guardian: Other: (Self) Name of Psychiatrist: NA Name of Therapist: NA  Education Status Is patient currently in school?: No Current Grade: NA Highest grade of school patient has completed: 12th grade Name of school: NA Contact person: NA  Risk to self with the past 6 months Suicidal Ideation: Yes-Currently Present Has patient been a risk to self within the past 6 months prior to admission? : Yes Suicidal Intent: Yes-Currently Present Has patient had any suicidal intent within the past 6 months prior to admission? : Yes Is patient at risk for suicide?: Yes Suicidal Plan?: Yes-Currently Present Has patient had any suicidal plan within the past 6 months prior to admission? : Yes Specify Current Suicidal Plan: Pt reported, wanting to drink bleach.  Access to Means: Yes Specify Access to Suicidal Means: Pt has access to bleach. What has been your use of drugs/alcohol within the last 12 months?: Tobacco. Previous Attempts/Gestures: Yes How many times?: 1 Other Self Harm Risks: Pt denies.  Triggers for Past Attempts: Unknown Intentional Self Injurious Behavior: None (Pt denies. ) Family Suicide History: No Recent stressful life event(s): Other (Comment) (Pt's wife reported, paranoia, AVH. ) Persecutory voices/beliefs?: No Depression: Yes Depression Symptoms: Loss of interest in usual pleasures, Guilt, Fatigue, Despondent, Insomnia, Isolating, Tearfulness, Feeling worthless/self pity Substance abuse history and/or treatment for substance abuse?: No Suicide prevention information given to non-admitted patients: Not applicable  Risk to Others within the past 6 months Homicidal  Ideation: No (Pt denies. ) Does patient have any lifetime risk of violence toward others beyond the six months prior to admission? : No Thoughts of Harm to Others: No Current Homicidal Intent: No Current Homicidal Plan: No Access to Homicidal Means: No Identified Victim: NA History of harm to others?: No Assessment of Violence: None Noted Violent Behavior Description: NA Does patient have access to weapons?: Yes (Comment) (Black powder rifle. ) Criminal Charges Pending?: No Does patient have a court date: No Is patient on probation?: No  Psychosis Hallucinations: Auditory, Visual Delusions: Unspecified  Mental Status Report Appearance/Hygiene: In scrubs Eye Contact: Fair Motor Activity: Unremarkable Speech: Logical/coherent Level of Consciousness: Quiet/awake Mood: Preoccupied Affect: Anxious Anxiety Level: Minimal Thought Processes: Coherent, Relevant Judgement: Impaired Orientation: Other (Comment) (year, city and state. ) Obsessive Compulsive Thoughts/Behaviors: None  Cognitive Functioning Concentration: Fair Memory: Recent Intact IQ: Average Insight: Poor Impulse Control: Fair Appetite: Poor Weight Loss: 0 Weight Gain: 0 Sleep: Decreased Vegetative Symptoms: None  ADLScreening [facility] Assessment Services) Patient's cognitive ability adequate to safely complete daily activities?: Yes Patient able to express need for assistance with ADLs?: Yes Independently performs ADLs?: Yes (appropriate for developmental age)  Prior Inpatient Therapy  Prior Inpatient Therapy: Yes Prior Therapy Dates: UTA Prior Therapy Facilty/Provider(s): UTA Reason for Treatment: UTA  Prior Outpatient Therapy Prior Outpatient Therapy: No Prior Therapy Dates: NA Prior Therapy Facilty/Provider(s): NA Reason for Treatment: NA Does patient have an ACCT team?: No Does patient have Intensive In-House Services?  : No Does patient have [facility] services? : No Does patient have P4CC services?:  No  ADL Screening (condition at time of admission) Patient's cognitive ability adequate to safely complete daily activities?: Yes Is the patient deaf or have difficulty hearing?: No Does the patient have difficulty seeing, even when wearing glasses/contacts?: Yes Does the patient have difficulty concentrating, remembering, or making decisions?: Yes Patient able to express need for assistance with ADLs?: Yes Does the patient have difficulty dressing or bathing?: No Independently performs ADLs?: Yes (appropriate for developmental age) Does the patient have difficulty walking or climbing stairs?: No Weakness of Legs: None Weakness of Arms/Hands: None       Abuse/Neglect Assessment (Assessment to be complete while patient is alone) Physical Abuse: Denies (Pt denies.) Verbal Abuse: Denies (Pt denies. ) Sexual Abuse: Denies (Pt denies. ) Exploitation of patient/patient's resources: Denies (Pt denies. ) Self-Neglect: Denies (Pt denies. )     Advance Directives (For Healthcare) Does Patient Have a Medical Advance Directive?: No    Additional Information 1:1 In Past 12 Months?: No CIRT Risk: No Elopement Risk: No Does patient have medical clearance?: Yes     Disposition: Lindon Romp, NP recommends inpatient treatment. Disposition discussed with Dr. Tyrone Nine and Eustaquio Maize, RN. TTS to seek placement.    Disposition Initial Assessment Completed for this Encounter: Yes Disposition of Patient: Inpatient treatment program Type of inpatient treatment program: Adult  Vertell Novak 04/14/2017 12:31 AM   Vertell Novak, MS, [facility], [facility] Triage Specialist [phone]

## 2017-04-14 NOTE — ED Notes (Signed)
Tele psych completed,  

## 2017-04-15 ENCOUNTER — Encounter (HOSPITAL_COMMUNITY): Payer: Self-pay | Admitting: Psychiatry

## 2017-04-15 DIAGNOSIS — R443 Hallucinations, unspecified: Secondary | ICD-10-CM

## 2017-04-15 DIAGNOSIS — F419 Anxiety disorder, unspecified: Secondary | ICD-10-CM

## 2017-04-15 DIAGNOSIS — F17210 Nicotine dependence, cigarettes, uncomplicated: Secondary | ICD-10-CM

## 2017-04-15 DIAGNOSIS — G4700 Insomnia, unspecified: Secondary | ICD-10-CM

## 2017-04-15 DIAGNOSIS — M542 Cervicalgia: Secondary | ICD-10-CM | POA: Diagnosis not present

## 2017-04-15 DIAGNOSIS — F39 Unspecified mood [affective] disorder: Secondary | ICD-10-CM

## 2017-04-15 DIAGNOSIS — F250 Schizoaffective disorder, bipolar type: Secondary | ICD-10-CM

## 2017-04-15 DIAGNOSIS — E872 Acidosis: Secondary | ICD-10-CM

## 2017-04-15 DIAGNOSIS — N179 Acute kidney failure, unspecified: Secondary | ICD-10-CM

## 2017-04-15 LAB — URINALYSIS, COMPLETE (UACMP) WITH MICROSCOPIC
Bilirubin Urine: NEGATIVE
Glucose, UA: NEGATIVE mg/dL
Hgb urine dipstick: NEGATIVE
Ketones, ur: 5 mg/dL — AB
Nitrite: NEGATIVE
Protein, ur: NEGATIVE mg/dL
Specific Gravity, Urine: 1.02 (ref 1.005–1.030)
pH: 5 (ref 5.0–8.0)

## 2017-04-15 LAB — GLUCOSE, CAPILLARY
Glucose-Capillary: 131 mg/dL — ABNORMAL HIGH (ref 65–99)
Glucose-Capillary: 118 mg/dL — ABNORMAL HIGH (ref 65–99)

## 2017-04-15 MED ORDER — BENZTROPINE MESYLATE 0.5 MG PO TABS
0.5000 mg | ORAL_TABLET | Freq: Every day | ORAL | Status: DC
  Administered 2017-04-15: 0.5 mg via ORAL
  Filled 2017-04-15 (×3): qty 1

## 2017-04-15 MED ORDER — LORAZEPAM 1 MG PO TABS: 1.0000 mg | ORAL_TABLET | Freq: Four times a day (QID) | ORAL | Status: DC | PRN

## 2017-04-15 MED ORDER — FLUOXETINE HCL 20 MG PO CAPS
20.0000 mg | ORAL_CAPSULE | Freq: Every day | ORAL | Status: DC
  Administered 2017-04-16: 20 mg via ORAL
  Filled 2017-04-15 (×3): qty 1

## 2017-04-15 MED ORDER — RISPERIDONE 1 MG PO TBDP: 1.0000 mg | ORAL_TABLET | Freq: Three times a day (TID) | ORAL | Status: DC | PRN

## 2017-04-15 MED ORDER — RISPERIDONE 0.5 MG PO TABS
0.5000 mg | ORAL_TABLET | Freq: Every day | ORAL | Status: DC
  Administered 2017-04-15 – 2017-04-16 (×2): 0.5 mg via ORAL
  Filled 2017-04-15 (×4): qty 1

## 2017-04-15 MED ORDER — RISPERIDONE 0.5 MG PO TABS
0.5000 mg | ORAL_TABLET | Freq: Every day | ORAL | Status: DC
  Administered 2017-04-15: 0.5 mg via ORAL
  Filled 2017-04-15 (×3): qty 1

## 2017-04-15 NOTE — Progress Notes (Signed)
Nursing Note 04/15/2017 [id number]  Data Reports sleeping fair without PRN sleep med.  Rates depression 6/10, hopelessness "little bit"/10, and anxiety 0/10. Affect flat, worried.  Denies HI, SI, AVH.  Continues to endorse a fear of being "shot in the head" and "something bad happening to me."  Continues to need reassurance that he will be kept safe.  At one point during the day endorsed feeling safe here, but doesn't feel safe if he left.  Asked RN "is my real wife coming tonight?" had concern the previous night that his wife wasn't really his wife until she came on the unit.  Patient needed a lot of encouragement to eat lunch, RN had to stand by patient and encourage him to fill his tray- ate about 60%. Started on risperidone this afternoon.  Action Spoke with patient 1:1, nurse offered support to patient throughout shift. Educated on risperidone. Continues to be monitored on 15 minute checks for safety.  Response Remains safe on unit, still showing signs of paranoia throughout day.

## 2017-04-15 NOTE — BHH Suicide Risk Assessment (Signed)
[place] INPATIENT:  Family/Significant Other Suicide Prevention Education  Suicide Prevention Education:  Education Completed;  wife, Melinda Martinique, [phone] has been identified by the patient as the family member/significant other with whom the patient will be residing, and identified as the person(s) who will aid the patient in the event of a mental health crisis (suicidal ideations/suicide attempt).  With written consent from the patient, the family member/significant other has been provided the following suicide prevention education, prior to the and/or following the discharge of the patient.  The suicide prevention education provided includes the following:  Suicide risk factors  Suicide prevention and interventions  National Suicide Hotline telephone number  [facility] assessment telephone number  [facility] Emergency Assistance [place] and/or Residential Mobile Crisis Unit telephone number  Request made of family/significant other to:  Remove weapons (e.g., guns, rifles, knives), all items previously/currently identified as safety concern.    Remove drugs/medications (over-the-counter, prescriptions, illicit drugs), all items previously/currently identified as a safety concern.  The family member/significant other verbalizes understanding of the suicide prevention education information provided.  The family member/significant other agrees to remove the items of safety concern listed above. She states she was unconcerned about SI until she looked at his google history and discovered the search for lethality of drinking bleach.  [name] also states she knew nothing about his mental health history prior to this hospitalization.  She is a Marine scientist, and is invested in making sure he goes to his follow up appointments and takes his meds going forward.  [name] 04/15/2017, 2:14 PM

## 2017-04-15 NOTE — Progress Notes (Signed)
Recreation Therapy Notes  INPATIENT RECREATION THERAPY ASSESSMENT  Patient Details Name: Travis [name] MRN: [id number] DOB: [birth year] Today's Date: 04/15/2017  Patient Stressors: Other (Comment) (Hearing and seeing things)  Pt stated he was here for seeing and hearing things.  Coping Skills:   Avoidance, Self-Injury, Music, Sports  Personal Challenges: Concentration, Self-Esteem/Confidence, Stress Management  Leisure Interests (2+):  Individual - TV, Social - Family, Therapist, music - Hospital doctor Resources:  Yes  Community Resources:  Art therapist, Patent examiner  Current Use: Yes  Patient Strengths:  Good heart for people, good family  Patient Identified Areas of Improvement:  Not to keep things in but let them out  Current Recreation Participation:  Every week  Patient Goal for Hospitalization:  "Get better"  [place] of Residence:  [place] of Residence:  [place]  Current [place] (including self-harm):  No  Current HI:  No  Consent to Intern Participation: N/A    Victorino Sparrow, LRT/CTRS  Victorino Sparrow A 04/15/2017, 12:34 PM

## 2017-04-15 NOTE — BHH Suicide Risk Assessment (Signed)
[facility] Admission Suicide Risk Assessment   Nursing information obtained from:  Patient Demographic factors:  Male, Divorced or widowed, Caucasian Current Mental Status:  Suicidal ideation indicated by patient, Suicide plan, Self-harm thoughts, Self-harm behaviors Loss Factors:  Loss of significant relationship, Financial problems / change in socioeconomic status Historical Factors:  Prior suicide attempts, Family history of mental illness or substance abuse Risk Reduction Factors:  Living with another person, especially a relative, Positive social support  Total Time spent with patient: 30 minutes Principal Problem: Schizoaffective disorder ([place]) Diagnosis:   Patient Active Problem List   Diagnosis Date Noted  . Schizoaffective disorder ([place]) [F25.9] 04/14/2017  . AKI (acute kidney injury) ([place]) [N17.9]   . Lactic acid acidosis [E87.2]   . Left sided numbness [R20.0]   . Sepsis ([place]) [A41.9]   . Elevated lactic acid level [R79.89]   . Leukocytosis [D72.829]   . Volume depletion [E86.9]   . Hypotension [I95.9] 03/13/2017  . Chest pain with moderate risk for cardiac etiology [R07.9] 01/13/2014  . Dyspnea [R06.00] 01/13/2014  . Family history of coronary artery disease- (mother had MI 24) [Z82.49] 01/13/2014  . Tobacco chew use [Z72.0]   . NIDDM (non-insulin dependent diabetes mellitus) [E11.9]   . Dyslipidemia [E78.5]   . Unspecified vitamin D deficiency [E55.9]   . 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 ESOPHAGEAL REFLUX [K21.9] 09/28/2007  . DYSPHAGIA UNSPECIFIED [R13.10] 09/28/2007  . BLOOD IN STOOL, OCCULT [R19.5] 09/28/2007  . HERNIORRHAPHY, HX OF [Z98.89] 09/28/2007   Subjective Data: Please see H&P.   Continued Clinical Symptoms:  Alcohol Use Disorder Identification Test Final Score (AUDIT): 0 The "Alcohol Use Disorders Identification Test", Guidelines for Use in Primary Care, Second Edition.  World Pharmacologist [facility]). Score between 0-7:   no or low risk or alcohol related problems. Score between 8-15:  moderate risk of alcohol related problems. Score between 16-19:  high risk of alcohol related problems. Score 20 or above:  warrants further diagnostic evaluation for alcohol dependence and treatment.   CLINICAL FACTORS:   Severe Anxiety and/or Agitation Currently Psychotic Unstable or Poor Therapeutic Relationship Previous Psychiatric Diagnoses and Treatments Medical Diagnoses and Treatments/Surgeries   Musculoskeletal: Strength & Muscle Tone: within normal limits Gait & Station: normal Patient leans: N/A  Psychiatric Specialty Exam: Physical Exam  Review of Systems  Psychiatric/Behavioral: Positive for depression, hallucinations and substance abuse. The patient is nervous/anxious.   All other systems reviewed and are negative.   Blood pressure 121/89, pulse (!) 109, temperature 98.8 F (37.1 C), temperature source Oral, resp. rate 18, height 5\' 5"  (1.651 m), weight 84.4 kg (186 lb).Body mass index is 30.95 kg/m.        Please see H&P.                                                   COGNITIVE FEATURES THAT CONTRIBUTE TO RISK:  Closed-mindedness, Polarized thinking and Thought constriction (tunnel vision)    SUICIDE RISK:   Moderate:  Frequent suicidal ideation with limited intensity, and duration, some specificity in terms of plans, no associated intent, good self-control, limited dysphoria/symptomatology, some risk factors present, and identifiable protective factors, including available and accessible social support.  PLAN OF CARE: Please see H&P.   I certify that inpatient services furnished can reasonably be expected to improve the patient's condition.  Jhanvi Drakeford, MD 04/15/2017, 12:39 PM

## 2017-04-15 NOTE — H&P (Signed)
Psychiatric Admission Assessment Adult  Patient Identification: Travis [name] MRN:  [id number] Date of Evaluation:  04/15/2017 Chief Complaint:  Patient states " I am suicidal, I see things, Am I safe here ?"  Principal Diagnosis: Schizoaffective disorder ([place]) Diagnosis:   Patient Active Problem List   Diagnosis Date Noted  . Schizoaffective disorder ([place]) [F25.9] 04/14/2017  . AKI (acute kidney injury) ([place]) [N17.9]   . Lactic acid acidosis [E87.2]   . Left sided numbness [R20.0]   . Sepsis ([place]) [A41.9]   . Elevated lactic acid level [R79.89]   . Leukocytosis [D72.829]   . Volume depletion [E86.9]   . Hypotension [I95.9] 03/13/2017  . Chest pain with moderate risk for cardiac etiology [R07.9] 01/13/2014  . Dyspnea [R06.00] 01/13/2014  . Family history of coronary artery disease- (mother had MI 65) [Z82.49] 01/13/2014  . Tobacco chew use [Z72.0]   . NIDDM (non-insulin dependent diabetes mellitus) [E11.9]   . Dyslipidemia [E78.5]   . Unspecified vitamin D deficiency [E55.9]   . 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 ESOPHAGEAL REFLUX [K21.9] 09/28/2007  . DYSPHAGIA UNSPECIFIED [R13.10] 09/28/2007  . BLOOD IN STOOL, OCCULT [R19.5] 09/28/2007  . HERNIORRHAPHY, HX OF [Z98.89] 09/28/2007   History of Present Illness: Travis [name] is a 74 y old CM, who is married , who has a hx of schizoaffective do ( possible) , presented IVC ed from [facility] for SI and psychosis.  Patient seen and chart reviewed.Discussed patient with treatment team. Pt seen as a limited historian. Pt presents today as labile, tearful , reports VH as well as AH, however unable to elaborate. When asked other questions , his response is almost always " I do not know." Pt appears to be anxious , restless. Pt also presents as paranoid , wants to know if he is safe here. Pt reports he is sad , had poor appetite as well as suicidal thoughts with plan to drink bleach. Pt unable to give details about  what medications he tried and what provider he follow up with . Pt reports several IP admissions in the past , but unable to give further details.Pt denies substance abuse issues , but his UDS is positive for BZD.  Per review of EHR - Pt presented to [facility] with SI with plan to drink bleach. Per wife who provided collateral information - pt felt people were watching him through TV. Wife reports pt researched what would happen if he drinks bleach. Pt also has access to a rifle . Pt was IVC ed by Dr.Miller -EDP - who has documented that Pt has had past psych admissions at [facility]. Pt also has hx of recurrent psychosis . Pt was admitted to hospital in may 2018 , for possible sepsis from GI source , was discharged on abx, had subsequent septic arthritis , finished 2 cources of abx , most recently on keflex , done 10 days ago. It appears that his paranoia started around the same time and he also had SI with plan around the same time . He also reported AH .   Associated Signs/Symptoms: Depression Symptoms:  depressed mood, difficulty concentrating, hopelessness, suicidal thoughts with specific plan, anxiety, (Hypo) Manic Symptoms:  Delusions, Hallucinations, Impulsivity, Anxiety Symptoms:  Excessive Worry, Psychotic Symptoms:  Delusions, Hallucinations: Auditory Visual Ideas of Reference, Paranoia, PTSD Symptoms: Negative Total Time spent with patient: 45 minutes  Past Psychiatric History: PLS see above HP section Is the patient at risk to self? Yes.    Has the patient been  a risk to self in the past 6 months? Yes.    Has the patient been a risk to self within the distant past? Yes.    Is the patient a risk to others? Yes.    Has the patient been a risk to others in the past 6 months? No.  Has the patient been a risk to others within the distant past? No.   Prior Inpatient Therapy:   Prior Outpatient Therapy:    Alcohol Screening: 1. How often do you have a drink containing alcohol?:  Never 9. Have you or someone else been injured as a result of your drinking?: No 10. Has a relative or friend or a doctor or another health worker been concerned about your drinking or suggested you cut down?: No Alcohol Use Disorder Identification Test Final Score (AUDIT): 0 Brief Intervention: AUDIT score less than 7 or less-screening does not suggest unhealthy drinking-brief intervention not indicated Substance Abuse History in the last 12 months:  No.pt denies , but UDS - positive for BZD Consequences of Substance Abuse: Negative Previous Psychotropic Medications: Yes - unable to give details Psychological Evaluations: Yes  Past Medical History:  Past Medical History:  Diagnosis Date  . Dyslipidemia   . Fatigue    NAUSEA,BLOATING  . H. pylori infection 09/15/2006  . Hematochezia 08/30/2007  . History of nuclear stress test 04/03/2009   exercise myoview; normal pattern of perfusion; low risk scan   . Hypertension    treated  . NIDDM (non-insulin dependent diabetes mellitus) 2011   type 2  . Schizoaffective disorder ([place])   . Tobacco chew use   . Unspecified vitamin D deficiency     Past Surgical History:  Procedure Laterality Date  . APPENDECTOMY    . HERNIA REPAIR     RIGHT  . KNEE SURGERY     BOTH  . TRANSTHORACIC ECHOCARDIOGRAM  04/03/2009   EF=>55%; trace TR, mild pulm regurg   Family History:  Family History  Problem Relation Age of Onset  . Stroke Father 11  . Pneumonia Father   . Heart failure Mother 78  . Coronary artery disease Mother        also MI  . Heart attack Mother   . Heart attack Maternal Uncle        died in 45s  . Cancer Unknown        x2; maternal side  . Diabetes Maternal Grandmother    Family Psychiatric  History: unable to give details , denies  Tobacco Screening: Have you used any form of tobacco in the last 30 days? (Cigarettes, Smokeless Tobacco, Cigars, and/or Pipes): Yes Tobacco use, Select all that apply: smokeless tobacco use  daily Are you interested in Tobacco Cessation Medications?: Yes, will notify MD for an order Counseled patient on smoking cessation including recognizing danger situations, developing coping skills and basic information about quitting provided: Refused/Declined practical counseling Social History: married , lives with wife in [place] , used to work for [employer] . History  Alcohol Use No     History  Drug Use No    Additional Social History:                           Allergies:   Allergies  Allergen Reactions  . Doxycycline Other (See Comments)    Reaction:  Blisters   . Lisinopril Swelling   Lab Results:  Results for orders placed or performed during the hospital  encounter of 04/14/17 (from the past 48 hour(s))  Glucose, capillary     Status: Abnormal   Collection Time: 04/14/17  5:23 PM  Result Value Ref Range   Glucose-Capillary 123 (H) 65 - 99 mg/dL  Glucose, capillary     Status: Abnormal   Collection Time: 04/15/17  6:14 AM  Result Value Ref Range   Glucose-Capillary 131 (H) 65 - 99 mg/dL    Blood Alcohol level:  Lab Results  Component Value Date   ETH <5 54/27/0623    Metabolic Disorder Labs:  Lab Results  Component Value Date   HGBA1C 10.6 (A) 08/11/2011   No results found for: PROLACTIN No results found for: CHOL, TRIG, HDL, CHOLHDL, VLDL, LDLCALC  Current Medications: Current Facility-Administered Medications  Medication Dose Route Frequency Provider Last Rate Last Dose  . acetaminophen (TYLENOL) tablet 650 mg  650 mg Oral Q6H PRN Okonkwo, Justina A, NP      . amLODipine (NORVASC) tablet 10 mg  10 mg Oral Daily Okonkwo, Justina A, NP   10 mg at 04/15/17 0746  . aspirin EC tablet 81 mg  81 mg Oral Daily Okonkwo, Justina A, NP   81 mg at 04/15/17 0746  . benztropine (COGENTIN) tablet 0.5 mg  0.5 mg Oral QHS Kaely Hollan, MD      . glipiZIDE (GLUCOTROL XL) 24 hr tablet 10 mg  10 mg Oral Q breakfast Okonkwo, Justina A, NP   10 mg at  04/15/17 0746  . hydrOXYzine (ATARAX/VISTARIL) tablet 25 mg  25 mg Oral TID PRN Lu Duffel, Justina A, NP      . indomethacin (INDOCIN) capsule 50 mg  50 mg Oral BID WC Okonkwo, Justina A, NP   50 mg at 04/15/17 0746  . loratadine (CLARITIN) tablet 10 mg  10 mg Oral Daily Okonkwo, Justina A, NP   10 mg at 04/15/17 0746  . LORazepam (ATIVAN) tablet 1 mg  1 mg Oral Q6H PRN Talyn Dessert, MD      . metFORMIN (GLUCOPHAGE) tablet 1,000 mg  1,000 mg Oral Q breakfast Okonkwo, Justina A, NP   1,000 mg at 04/15/17 0746  . nicotine polacrilex (NICORETTE) gum 2 mg  2 mg Oral PRN Laverle Hobby, PA-C      . nitroGLYCERIN (NITROSTAT) SL tablet 0.4 mg  0.4 mg Sublingual Q5 min PRN Okonkwo, Justina A, NP      . pantoprazole (PROTONIX) EC tablet 40 mg  40 mg Oral Daily Okonkwo, Justina A, NP   40 mg at 04/15/17 0746  . risperiDONE (RISPERDAL M-TABS) disintegrating tablet 1 mg  1 mg Oral TID PRN Henessy Rohrer, Ria Clock, MD      . risperiDONE (RISPERDAL) tablet 0.5 mg  0.5 mg Oral Q lunch Yusra Ravert, MD      . risperiDONE (RISPERDAL) tablet 0.5 mg  0.5 mg Oral QHS Derek Laughter, MD      . tamsulosin (FLOMAX) capsule 0.4 mg  0.4 mg Oral QPC breakfast Okonkwo, Justina A, NP   0.4 mg at 04/15/17 0746  . traZODone (DESYREL) tablet 100 mg  100 mg Oral QHS Okonkwo, Justina A, NP       PTA Medications: Prescriptions Prior to Admission  Medication Sig Dispense Refill Last Dose  . acetaminophen (TYLENOL) 325 MG tablet Take 650 mg by mouth every 6 (six) hours as needed for mild pain.   Past Week at Unknown time  . amLODipine (NORVASC) 10 MG tablet Take 10 mg by mouth daily.   04/13/2017 at 0930  .  aspirin EC 81 MG tablet Take 81 mg by mouth daily.   04/13/2017 at 0930  . glipiZIDE (GLUCOTROL XL) 10 MG 24 hr tablet Take 10 mg by mouth daily.    04/13/2017 at 0930  . HYDROcodone-acetaminophen (NORCO/VICODIN) 5-325 MG tablet Take 1 tablet by mouth daily as needed.   04/11/2017 at Unknown time  . indomethacin (INDOCIN) 25 MG  capsule Take 50 mg by mouth 2 (two) times daily with a meal.    04/13/2017 at 0930  . loratadine (CLARITIN) 10 MG tablet Take 10 mg by mouth daily.   Past Week at Unknown time  . metFORMIN (GLUCOPHAGE) 1000 MG tablet Take 1,000 mg by mouth daily.    04/12/2017 at Unknown time  . nitroGLYCERIN (NITROSTAT) 0.4 MG SL tablet Place 0.4 mg under the tongue every 5 (five) minutes as needed for chest pain.   PRN  . pantoprazole (PROTONIX) 40 MG tablet Take 1 tablet (40 mg total) by mouth daily. 30 tablet 0 04/13/2017 at 0930  . saccharomyces boulardii (FLORASTOR) 250 MG capsule Take 1 capsule (250 mg total) by mouth 2 (two) times daily. (Patient taking differently: Take 250 mg by mouth daily. ) 60 capsule 0 04/13/2017 at 0930  . tamsulosin (FLOMAX) 0.4 MG CAPS capsule Take 0.4 mg by mouth daily after breakfast.   04/13/2017 at 0930  . traZODone (DESYREL) 100 MG tablet Take 100 mg by mouth at bedtime.    04/12/2017 at Unknown time    Musculoskeletal: Strength & Muscle Tone: within normal limits Gait & Station: normal Patient leans: N/A  Psychiatric Specialty Exam: Physical Exam  Review of Systems  Psychiatric/Behavioral: Positive for depression, hallucinations and suicidal ideas. The patient is nervous/anxious.   All other systems reviewed and are negative.   Blood pressure 121/89, pulse (!) 109, temperature 98.8 F (37.1 C), temperature source Oral, resp. rate 18, height 5\' 5"  (1.651 m), weight 84.4 kg (186 lb).Body mass index is 30.95 kg/m.  General Appearance: Casual  Eye Contact:  Fair  Speech:  Normal Rate  Volume:  Decreased  Mood:  Anxious, Depressed and Dysphoric  Affect:  Congruent  Thought Process:  Irrelevant and Descriptions of Associations: Circumstantial  Orientation:  Full (Time, Place, and Person)  Thought Content:  Delusions, Hallucinations: Auditory Visual, Ideas of Reference:   Paranoia Delusions, Paranoid Ideation and Rumination  Suicidal Thoughts:  Yes.  with intent/plan   Homicidal Thoughts:  No  Memory:  Immediate;   Fair Recent;   Fair Remote;   Poor  Judgement:  Impaired  Insight:  Fair  Psychomotor Activity:  Normal  Concentration:  Concentration: Fair and Attention Span: Poor  Recall:  [employer] of Knowledge:  Fair  Language:  Fair  Akathisia:  No  Handed:  Right  AIMS (if indicated):     Assets:  Desire for Improvement  ADL's:  Intact  Cognition:  WNL  Sleep:  Number of Hours: 4.5    Treatment Plan Summary:Patient with past hx of psychosis, possible diagnosis of schizoaffective disorder , presented with SI with plan , psychosis , has had past IP admissions, recent medical issues , as well as ABX treatments , UDS - positive for BZD , however he denies abusing it . Will need more collateral information from wife- CSW to work on this. Continue treatment. Daily contact with patient to assess and evaluate symptoms and progress in treatment, Medication management and Plan see below Patient will benefit from inpatient treatment and stabilization.  Estimated length of stay  is 5-7 days.  Reviewed past medical records,treatment plan.  Will start a trial of Risperidone 0.5 mg po bid for psychosis. Will add prozac 20 mg po daily for mood sx. Trazodone 100 mg po qhs for sleep. UDS - positive for BZD. Review of  controlled substance database does not show any prescriptions for BZD . Pt was most recently on hydrocodone prescribed 04/11/17. Will start CIWA/ativan prn for possible BZD withdrawal sx. Restart home medications where indicated. Will continue to monitor vitals ,medication compliance and treatment side effects while patient is here.  Will monitor for medical issues as well as call consult as needed.  Reviewed labs ,K+ - 3.1 , will repeat , cbc - shows wbc as high , will repeat ,will order tsh, lipid apnel, hba1c, pl Reviewed EKG - qtc - wnl, sinus tachycardia pos.  CSW will start working on disposition.  Patient to participate in therapeutic  milieu .      Observation Level/Precautions:  15 minute checks    Psychotherapy:  Individual and group therapy     Consultations:  CSW  Discharge Concerns:  Stability and safety       Physician Treatment Plan for Primary Diagnosis: Schizoaffective disorder ([place]) Long Term Goal(s): Improvement in symptoms so as ready for discharge  Short Term Goals: Ability to verbalize feelings will improve, Ability to disclose and discuss suicidal ideas and Compliance with prescribed medications will improve  Physician Treatment Plan for Secondary Diagnosis: Principal Problem:   Schizoaffective disorder ([place])  Long Term Goal(s): Improvement in symptoms so as ready for discharge  Short Term Goals: Ability to verbalize feelings will improve, Ability to disclose and discuss suicidal ideas and Compliance with prescribed medications will improve  I certify that inpatient services furnished can reasonably be expected to improve the patient's condition.    Merryn Thaker, MD 8/21/20181:00 PM

## 2017-04-15 NOTE — Progress Notes (Signed)
Recreation Therapy Notes  Animal-Assisted Activity (AAA) Program Checklist/Progress Notes Patient Eligibility Criteria Checklist & Daily Group note for Rec Tx Intervention  Date: 08.21.2018 Time: 2:45pm Location: [address]    AAA/T Program Assumption of Risk Form signed by Patient/ or Parent Legal Guardian Yes  Patient is free of allergies or sever asthma Yes  Patient reports no fear of animals Yes  Patient reports no history of cruelty to animals Yes  Patient understands his/her participation is voluntary Yes  Patient washes hands before animal contact Yes  Patient washes hands after animal contact Yes  Behavioral Response: Appropriate   Education: Hand Washing, Appropriate Animal Interaction   Education Outcome: Acknowledges education.   Clinical Observations/Feedback: Patient discussed with MD for appropriateness in pet therapy session. Both LRT and MD agree patient is appropriate for participation. Patient offered participation in session and signed necessary consent form without issue. Patient attended session, made no statements and chose to observe peer interaction with therapy dog. Patient remained in session for approximately 20 minutes before he exited group with peers.   Laureen Ochs Quintara Bost, LRT/CTRS        Lane Hacker 04/15/2017 3:18 PM

## 2017-04-15 NOTE — Progress Notes (Signed)
Pt on unit with wife as visitor.  Pt is somewhat confused at times and is very guarded.  Pt unable to sts the names of his medication and appears confused when asked ; "You mean my sugar pill?"  Pt up in bed but does not want his sleep medication and then changes his mind.  Pt ends up not taking his sleep medication.  Pt is slow to respond but does brighten up when talking about his wife and that she will return for another visit.  Pt has concern about his safety at times and is redirected with explanation that he is safe on the unit. Pt sts he has AVH at times but does not elaborate. Pt attends group and then watches TV before going to bed.   Pt remains safe on unit.

## 2017-04-15 NOTE — Progress Notes (Signed)
Adult Psychoeducational Group Note  Date:  04/15/2017 Time:  11:53 PM  Group Topic/Focus:  Wrap-Up Group:   The focus of this group is to help patients review their daily goal of treatment and discuss progress on daily workbooks.  Participation Level:  Active  Participation Quality:  Appropriate  Affect:  Appropriate  Cognitive:  Appropriate  Insight: Appropriate and Good  Engagement in Group:  Engaged  Modes of Intervention:  Discussion  Additional Comments:  Pt stated his goal for today was to work with doctors on his medication. Pt stated he felt good when he accomplished his goal. Pt rated his overall day a 6. Pt stated he attended all group session held today. Pt stated his family coming by to visited help make today a productive day.  Candy Sledge 04/15/2017, 11:53 PM

## 2017-04-15 NOTE — BHH Group Notes (Signed)
[facility] LCSW Group Therapy  04/15/2017 1:15 pm  Type of Therapy: Process Group Therapy  Participation Level:  Active  Participation Quality:  Appropriate  Affect:  Flat  Cognitive:  Oriented  Insight:  Improving  Engagement in Group:  Limited  Engagement in Therapy:  Limited  Modes of Intervention:  Activity, Clarification, Education, Problem-solving and Support  Summary of Progress/Problems: Today's group addressed the issue of overcoming obstacles.  Patients were asked to identify their biggest obstacle post d/c that stands in the way of their on-going success, and then problem solve as to how to manage this. Stayed the entire time, engaged throughout.  Demonstrated insight by stating his biggest obstacle is his confusion, and he is hopeful getting started on meds will help with that. He asked about following up at a clinic, and told me he lives in [place], but when I asked further, he changed to [facility], and agreed that he could follow up in [place].  [name] 04/15/2017   2:11 PM

## 2017-04-15 NOTE — Progress Notes (Signed)
Recreation Therapy Notes  Date: 04/15/17 Time: 1000 Location: [address] Dayroom  Group Topic: Leisure Education  Goal Area(s) Addresses:  Patient will identify positive leisure activities.  Patient will identify one positive benefit of participation in leisure activities.   Intervention: Tin can with activities, white board, dry erase marker, eraser  Activity: Leisure [place].  Patients had to pick a slip of paper from the tin can.  Each slip of paper had a leisure activity on it.  LRT divided the group into two groups.  Patients were to draw a picture of what was on their slip of paper.  Their team would get a minute to guess the picture.  If their team did not guess correctly, the other team got a chance to still the point.  The team with the most points won.  Education:  Leisure Education, Dentist  Education Outcome: Acknowledges education/In group clarification offered/Needs additional education  Clinical Observations/Feedback: Pt did not attend group.   Victorino Sparrow, LRT/CTRS         Victorino Sparrow A 04/15/2017 12:03 PM

## 2017-04-15 NOTE — Progress Notes (Signed)
Pt in dayroom with his wife visiting.  Pt and wife are holding hands and smiling.  Pts wife speaks with this Probation officer and is pleasant and reassuring to pt.  Pt CIWA is below 4 and pt denies withdrawal symptoms.  Pt is preoccupied with worrying about forms he signed.  Wife reassures pt that everything is OK.  Pt walking the halls after wife leaves from visit.  Pt is med compliant, redirectable and cooperative. Pt offered encouragement and support.    Pt remains safe on unit.

## 2017-04-16 ENCOUNTER — Inpatient Hospital Stay (HOSPITAL_COMMUNITY)
Admission: AD | Admit: 2017-04-16 | Discharge: 2017-04-18 | DRG: 683 | Disposition: A | Discharge status: Other Acute Inpatient Hospital | Payer: 59 | Source: Other Acute Inpatient Hospital | Attending: Internal Medicine | Admitting: Internal Medicine

## 2017-04-16 ENCOUNTER — Other Ambulatory Visit: Payer: Self-pay

## 2017-04-16 DIAGNOSIS — R200 Anesthesia of skin: Secondary | ICD-10-CM | POA: Diagnosis not present

## 2017-04-16 DIAGNOSIS — E872 Acidosis: Secondary | ICD-10-CM | POA: Diagnosis not present

## 2017-04-16 DIAGNOSIS — N182 Chronic kidney disease, stage 2 (mild): Secondary | ICD-10-CM | POA: Diagnosis present

## 2017-04-16 DIAGNOSIS — Z7982 Long term (current) use of aspirin: Secondary | ICD-10-CM

## 2017-04-16 DIAGNOSIS — T40605A Adverse effect of unspecified narcotics, initial encounter: Secondary | ICD-10-CM | POA: Diagnosis present

## 2017-04-16 DIAGNOSIS — E1122 Type 2 diabetes mellitus with diabetic chronic kidney disease: Secondary | ICD-10-CM | POA: Diagnosis present

## 2017-04-16 DIAGNOSIS — E119 Type 2 diabetes mellitus without complications: Secondary | ICD-10-CM

## 2017-04-16 DIAGNOSIS — Z888 Allergy status to other drugs, medicaments and biological substances status: Secondary | ICD-10-CM | POA: Diagnosis not present

## 2017-04-16 DIAGNOSIS — F250 Schizoaffective disorder, bipolar type: Secondary | ICD-10-CM

## 2017-04-16 DIAGNOSIS — M542 Cervicalgia: Secondary | ICD-10-CM | POA: Diagnosis not present

## 2017-04-16 DIAGNOSIS — N179 Acute kidney failure, unspecified: Principal | ICD-10-CM | POA: Diagnosis present

## 2017-04-16 DIAGNOSIS — F259 Schizoaffective disorder, unspecified: Secondary | ICD-10-CM | POA: Diagnosis present

## 2017-04-16 DIAGNOSIS — F17220 Nicotine dependence, chewing tobacco, uncomplicated: Secondary | ICD-10-CM | POA: Diagnosis present

## 2017-04-16 DIAGNOSIS — K219 Gastro-esophageal reflux disease without esophagitis: Secondary | ICD-10-CM | POA: Diagnosis present

## 2017-04-16 DIAGNOSIS — G4700 Insomnia, unspecified: Secondary | ICD-10-CM | POA: Diagnosis not present

## 2017-04-16 DIAGNOSIS — F329 Major depressive disorder, single episode, unspecified: Secondary | ICD-10-CM | POA: Diagnosis present

## 2017-04-16 DIAGNOSIS — I129 Hypertensive chronic kidney disease with stage 1 through stage 4 chronic kidney disease, or unspecified chronic kidney disease: Secondary | ICD-10-CM | POA: Diagnosis present

## 2017-04-16 DIAGNOSIS — E876 Hypokalemia: Secondary | ICD-10-CM | POA: Diagnosis present

## 2017-04-16 DIAGNOSIS — N289 Disorder of kidney and ureter, unspecified: Secondary | ICD-10-CM | POA: Diagnosis not present

## 2017-04-16 DIAGNOSIS — K5903 Drug induced constipation: Secondary | ICD-10-CM | POA: Diagnosis present

## 2017-04-16 DIAGNOSIS — Z8249 Family history of ischemic heart disease and other diseases of the circulatory system: Secondary | ICD-10-CM | POA: Diagnosis not present

## 2017-04-16 DIAGNOSIS — Z833 Family history of diabetes mellitus: Secondary | ICD-10-CM | POA: Diagnosis not present

## 2017-04-16 DIAGNOSIS — F333 Major depressive disorder, recurrent, severe with psychotic symptoms: Secondary | ICD-10-CM | POA: Diagnosis not present

## 2017-04-16 DIAGNOSIS — E860 Dehydration: Secondary | ICD-10-CM | POA: Diagnosis present

## 2017-04-16 DIAGNOSIS — Z881 Allergy status to other antibiotic agents status: Secondary | ICD-10-CM | POA: Diagnosis not present

## 2017-04-16 DIAGNOSIS — E1169 Type 2 diabetes mellitus with other specified complication: Secondary | ICD-10-CM

## 2017-04-16 DIAGNOSIS — R45851 Suicidal ideations: Secondary | ICD-10-CM | POA: Diagnosis present

## 2017-04-16 DIAGNOSIS — F419 Anxiety disorder, unspecified: Secondary | ICD-10-CM | POA: Diagnosis not present

## 2017-04-16 DIAGNOSIS — R443 Hallucinations, unspecified: Secondary | ICD-10-CM | POA: Diagnosis not present

## 2017-04-16 DIAGNOSIS — Y92239 Unspecified place in hospital as the place of occurrence of the external cause: Secondary | ICD-10-CM | POA: Diagnosis present

## 2017-04-16 DIAGNOSIS — F29 Unspecified psychosis not due to a substance or known physiological condition: Secondary | ICD-10-CM | POA: Diagnosis not present

## 2017-04-16 DIAGNOSIS — Z560 Unemployment, unspecified: Secondary | ICD-10-CM | POA: Diagnosis not present

## 2017-04-16 DIAGNOSIS — R450 Nervousness: Secondary | ICD-10-CM | POA: Diagnosis not present

## 2017-04-16 DIAGNOSIS — Z7984 Long term (current) use of oral hypoglycemic drugs: Secondary | ICD-10-CM

## 2017-04-16 LAB — CBC WITH DIFFERENTIAL/PLATELET
Basophils Absolute: 0.1 10*3/uL (ref 0.0–0.1)
Basophils Relative: 1 %
Eosinophils Absolute: 0.5 10*3/uL (ref 0.0–0.7)
Eosinophils Relative: 3 %
HCT: 45 % (ref 39.0–52.0)
Hemoglobin: 15.3 g/dL (ref 13.0–17.0)
Lymphocytes Relative: 28 %
Lymphs Abs: 4.2 10*3/uL — ABNORMAL HIGH (ref 0.7–4.0)
MCH: 29.1 pg (ref 26.0–34.0)
MCHC: 34 g/dL (ref 30.0–36.0)
MCV: 85.7 fL (ref 78.0–100.0)
Monocytes Absolute: 1.4 10*3/uL — ABNORMAL HIGH (ref 0.1–1.0)
Monocytes Relative: 9 %
Neutro Abs: 8.7 10*3/uL — ABNORMAL HIGH (ref 1.7–7.7)
Neutrophils Relative %: 59 %
Platelets: 263 10*3/uL (ref 150–400)
RBC: 5.25 MIL/uL (ref 4.22–5.81)
RDW: 13.4 % (ref 11.5–15.5)
WBC: 14.8 10*3/uL — ABNORMAL HIGH (ref 4.0–10.5)

## 2017-04-16 LAB — CBC
HCT: 43 % (ref 39.0–52.0)
Hemoglobin: 14.8 g/dL (ref 13.0–17.0)
MCH: 29.3 pg (ref 26.0–34.0)
MCHC: 34.4 g/dL (ref 30.0–36.0)
MCV: 85.1 fL (ref 78.0–100.0)
Platelets: 224 10*3/uL (ref 150–400)
RBC: 5.05 MIL/uL (ref 4.22–5.81)
RDW: 13.3 % (ref 11.5–15.5)
WBC: 15.6 10*3/uL — ABNORMAL HIGH (ref 4.0–10.5)

## 2017-04-16 LAB — LIPID PANEL
Cholesterol: 152 mg/dL (ref 0–200)
HDL: 23 mg/dL — ABNORMAL LOW (ref >40)
LDL Cholesterol: 87 mg/dL (ref 0–99)
Total CHOL/HDL Ratio: 6.6 RATIO
Triglycerides: 209 mg/dL — ABNORMAL HIGH (ref <150)
VLDL: 42 mg/dL — ABNORMAL HIGH (ref 0–40)

## 2017-04-16 LAB — HEMOGLOBIN A1C
Hgb A1c MFr Bld: 6.3 % — ABNORMAL HIGH (ref 4.8–5.6)
Mean Plasma Glucose: 134.11 mg/dL

## 2017-04-16 LAB — COMPREHENSIVE METABOLIC PANEL
ALT: 31 U/L (ref 17–63)
AST: 24 U/L (ref 15–41)
Albumin: 4.3 g/dL (ref 3.5–5.0)
Alkaline Phosphatase: 55 U/L (ref 38–126)
Anion gap: 15 (ref 5–15)
BUN: 54 mg/dL — ABNORMAL HIGH (ref 6–20)
CO2: 28 mmol/L (ref 22–32)
Calcium: 9.9 mg/dL (ref 8.9–10.3)
Chloride: 98 mmol/L — ABNORMAL LOW (ref 101–111)
Creatinine, Ser: 3.31 mg/dL — ABNORMAL HIGH (ref 0.61–1.24)
GFR calc Af Amer: 22 mL/min — ABNORMAL LOW (ref >60)
GFR calc non Af Amer: 19 mL/min — ABNORMAL LOW (ref >60)
Glucose, Bld: 109 mg/dL — ABNORMAL HIGH (ref 65–99)
Potassium: 3.3 mmol/L — ABNORMAL LOW (ref 3.5–5.1)
Sodium: 141 mmol/L (ref 135–145)
Total Bilirubin: 0.7 mg/dL (ref 0.3–1.2)
Total Protein: 7.6 g/dL (ref 6.5–8.1)

## 2017-04-16 LAB — GLUCOSE, CAPILLARY
Glucose-Capillary: 103 mg/dL — ABNORMAL HIGH (ref 65–99)
Glucose-Capillary: 90 mg/dL (ref 65–99)
Glucose-Capillary: 84 mg/dL (ref 65–99)
Glucose-Capillary: 63 mg/dL — ABNORMAL LOW (ref 65–99)
Glucose-Capillary: 91 mg/dL (ref 65–99)

## 2017-04-16 LAB — CREATININE, SERUM
Creatinine, Ser: 3.33 mg/dL — ABNORMAL HIGH (ref 0.61–1.24)
GFR calc Af Amer: 22 mL/min — ABNORMAL LOW (ref >60)
GFR calc non Af Amer: 19 mL/min — ABNORMAL LOW (ref >60)

## 2017-04-16 LAB — TSH: TSH: 1.766 u[IU]/mL (ref 0.350–4.500)

## 2017-04-16 MED ORDER — TRAZODONE HCL 50 MG PO TABS
50.0000 mg | ORAL_TABLET | Freq: Every day | ORAL | Status: DC
  Administered 2017-04-16 – 2017-04-17 (×2): 50 mg via ORAL
  Filled 2017-04-16 (×2): qty 1

## 2017-04-16 MED ORDER — RISPERIDONE 1 MG PO TABS
1.0000 mg | ORAL_TABLET | Freq: Every day | ORAL | Status: DC
  Filled 2017-04-16 (×2): qty 1

## 2017-04-16 MED ORDER — ONDANSETRON HCL 4 MG/2ML IJ SOLN: 4.0000 mg | Freq: Four times a day (QID) | INTRAMUSCULAR | Status: DC | PRN

## 2017-04-16 MED ORDER — ACETAMINOPHEN 325 MG PO TABS: 650.0000 mg | ORAL_TABLET | Freq: Four times a day (QID) | ORAL | Status: DC | PRN

## 2017-04-16 MED ORDER — SACCHAROMYCES BOULARDII 250 MG PO CAPS
250.0000 mg | ORAL_CAPSULE | Freq: Two times a day (BID) | ORAL | Status: DC
  Administered 2017-04-16 – 2017-04-18 (×4): 250 mg via ORAL
  Filled 2017-04-16 (×4): qty 1

## 2017-04-16 MED ORDER — RISPERIDONE 0.5 MG PO TABS
0.5000 mg | ORAL_TABLET | Freq: Every day | ORAL | Status: DC
  Administered 2017-04-17 – 2017-04-18 (×2): 0.5 mg via ORAL

## 2017-04-16 MED ORDER — LORATADINE 10 MG PO TABS
10.0000 mg | ORAL_TABLET | Freq: Every day | ORAL | Status: DC
  Administered 2017-04-17 – 2017-04-18 (×2): 10 mg via ORAL
  Filled 2017-04-16 (×2): qty 1

## 2017-04-16 MED ORDER — TAMSULOSIN HCL 0.4 MG PO CAPS
0.4000 mg | ORAL_CAPSULE | Freq: Every day | ORAL | Status: DC
  Administered 2017-04-17 – 2017-04-18 (×2): 0.4 mg via ORAL
  Filled 2017-04-16 (×2): qty 1

## 2017-04-16 MED ORDER — NITROGLYCERIN 0.4 MG SL SUBL: 0.4000 mg | SUBLINGUAL_TABLET | SUBLINGUAL | Status: DC | PRN

## 2017-04-16 MED ORDER — SODIUM CHLORIDE 0.9 % IV SOLN
INTRAVENOUS | Status: DC
  Administered 2017-04-16 – 2017-04-17 (×3): via INTRAVENOUS

## 2017-04-16 MED ORDER — RISPERIDONE 1 MG PO TABS
1.0000 mg | ORAL_TABLET | Freq: Every day | ORAL | Status: DC
  Administered 2017-04-16 – 2017-04-17 (×2): 1 mg via ORAL
  Filled 2017-04-16 (×6): qty 1

## 2017-04-16 MED ORDER — ENOXAPARIN SODIUM 30 MG/0.3ML ~~LOC~~ SOLN
30.0000 mg | SUBCUTANEOUS | Status: DC
  Administered 2017-04-16 – 2017-04-17 (×2): 30 mg via SUBCUTANEOUS
  Filled 2017-04-16 (×2): qty 0.3

## 2017-04-16 MED ORDER — INSULIN ASPART 100 UNIT/ML ~~LOC~~ SOLN
0.0000 [IU] | Freq: Three times a day (TID) | SUBCUTANEOUS | Status: DC
Start: 2017-04-17 — End: 2017-04-18
  Administered 2017-04-17: 1 [IU] via SUBCUTANEOUS
  Administered 2017-04-18: 3 [IU] via SUBCUTANEOUS

## 2017-04-16 MED ORDER — HYDROCODONE-ACETAMINOPHEN 5-325 MG PO TABS: 1.0000 | ORAL_TABLET | Freq: Four times a day (QID) | ORAL | Status: DC | PRN

## 2017-04-16 MED ORDER — TRAZODONE HCL 50 MG PO TABS: 50.0000 mg | ORAL_TABLET | Freq: Every day | ORAL | Status: DC

## 2017-04-16 MED ORDER — FLUOXETINE HCL 20 MG PO CAPS
20.0000 mg | ORAL_CAPSULE | Freq: Every day | ORAL | Status: DC
  Administered 2017-04-17 – 2017-04-18 (×2): 20 mg via ORAL
  Filled 2017-04-16 (×2): qty 1

## 2017-04-16 MED ORDER — PANTOPRAZOLE SODIUM 40 MG PO TBEC
40.0000 mg | DELAYED_RELEASE_TABLET | Freq: Every day | ORAL | Status: DC
Start: 2017-04-17 — End: 2017-04-18
  Administered 2017-04-17 – 2017-04-18 (×2): 40 mg via ORAL
  Filled 2017-04-16 (×2): qty 1

## 2017-04-16 MED ORDER — AMLODIPINE BESYLATE 10 MG PO TABS
10.0000 mg | ORAL_TABLET | Freq: Every day | ORAL | Status: DC
  Administered 2017-04-17 – 2017-04-18 (×2): 10 mg via ORAL
  Filled 2017-04-16 (×2): qty 1

## 2017-04-16 MED ORDER — ASPIRIN EC 81 MG PO TBEC
81.0000 mg | DELAYED_RELEASE_TABLET | Freq: Every day | ORAL | Status: DC
  Administered 2017-04-17 – 2017-04-18 (×2): 81 mg via ORAL
  Filled 2017-04-16 (×2): qty 1

## 2017-04-16 MED ORDER — CIPROFLOXACIN HCL 500 MG PO TABS: 500.0000 mg | ORAL_TABLET | Freq: Every day | ORAL | Status: DC

## 2017-04-16 MED ORDER — ONDANSETRON HCL 4 MG PO TABS: 4.0000 mg | ORAL_TABLET | Freq: Four times a day (QID) | ORAL | Status: DC | PRN

## 2017-04-16 MED ORDER — TAMSULOSIN HCL 0.4 MG PO CAPS: 0.4000 mg | ORAL_CAPSULE | Freq: Every day | ORAL | Status: DC

## 2017-04-16 NOTE — BHH Suicide Risk Assessment (Signed)
[facility] Discharge Suicide Risk Assessment   Principal Problem: Schizoaffective disorder [facility]) Discharge Diagnoses:  Patient Active Problem List   Diagnosis Date Noted  . Schizoaffective disorder ([place]) [F25.9] 04/14/2017  . AKI (acute kidney injury) ([place]) [N17.9]   . Lactic acid acidosis [E87.2]   . Left sided numbness [R20.0]   . Sepsis ([place]) [A41.9]   . Elevated lactic acid level [R79.89]   . Leukocytosis [D72.829]   . Volume depletion [E86.9]   . Hypotension [I95.9] 03/13/2017  . Chest pain with moderate risk for cardiac etiology [R07.9] 01/13/2014  . Dyspnea [R06.00] 01/13/2014  . Family history of coronary artery disease- (mother had MI 29) [Z82.49] 01/13/2014  . Tobacco chew use [Z72.0]   . NIDDM (non-insulin dependent diabetes mellitus) [E11.9]   . Dyslipidemia [E78.5]   . Unspecified vitamin D deficiency [E55.9]   . 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 ESOPHAGEAL REFLUX [K21.9] 09/28/2007  . DYSPHAGIA UNSPECIFIED [R13.10] 09/28/2007  . BLOOD IN STOOL, OCCULT [R19.5] 09/28/2007  . HERNIORRHAPHY, HX OF [Z98.89] 09/28/2007    Total Time spent with patient: 20 minutes  Musculoskeletal: Strength & Muscle Tone: within normal limits Gait & Station: normal Patient leans: N/A  Psychiatric Specialty Exam: Review of Systems  Musculoskeletal: Positive for back pain.  Psychiatric/Behavioral: Positive for depression and hallucinations. The patient is nervous/anxious.   All other systems reviewed and are negative.   Blood pressure 108/86, pulse (!) 118, temperature 98.6 F (37 C), temperature source Oral, resp. rate 16, height 5\' 5"  (1.651 m), weight 84.4 kg (186 lb).Body mass index is 30.95 kg/m.          pls refer to progress note from today                                             Mental Status Per Nursing Assessment::   On Admission:  Suicidal ideation indicated by patient, Suicide plan, Self-harm thoughts, Self-harm  behaviors  Demographic Factors:  Male and Caucasian  Loss Factors: NA  Historical Factors: Impulsivity  Risk Reduction Factors:   Positive social support and Positive therapeutic relationship  Continued Clinical Symptoms:  Currently Psychotic Medical Diagnoses and Treatments/Surgeries  Cognitive Features That Contribute To Risk:  None    Suicide Risk:  Mild:  Suicidal ideation of limited frequency, intensity, duration, and specificity.  There are no identifiable plans, no associated intent, mild dysphoria and related symptoms, good self-control (both objective and subjective assessment), few other risk factors, and identifiable protective factors, including available and accessible social support.    Plan Of Care/Follow-up recommendations: patient to be transferred to [facility] team 2 - telemetry for renal failure. Activity:  as per accepting team Diet:  carb modified Tests:  as per accepting team Other:  none  Tymarion Everard, MD 04/16/2017, 3:28 PM

## 2017-04-16 NOTE — Progress Notes (Signed)
Report called to Jacques Earthly, RN., at [phone]. Per Doree Albee., pt may be transported by [facility] if IVC'd as long as pt is appropriate along with a sitter. Pt transferred to [facility] room 1429 by [facility] transportation.   Pt accompanied by [facility] MHT. Per Doree Albee., MHT is to escort pt over to [facility] room 1429 and then return back to [facility]. Writer sent over pt face sheet along with original IVC documentation. Pt belongings sent with pt from room and locker 13. Items listed on belonging sheet (hat, sneakers and chewing tobacco). Writer notified pt wife Mrs. [place] of pt transfer to [employer].

## 2017-04-16 NOTE — [facility] Group Notes (Signed)
[place] LCSW Group Therapy  04/16/2017 2:50 PM   Type of Therapy:  Group Therapy  Participation Level:  Active  Participation Quality:  Attentive  Affect:  Appropriate  Cognitive:  Appropriate  Insight:  Improving  Engagement in Therapy:  Engaged  Modes of Intervention:  Clarification, Education, Exploration and Socialization  Summary of Progress/Problems: Today's group focused on resilience.  Travis [name] attended group.  He felt that working with professionals and medications in the past helped him to be resilient.  He also attributed some of his resilience to his wife and his family.   [name] 04/16/2017 , 2:50 PM

## 2017-04-16 NOTE — Progress Notes (Signed)
[facility] MD Progress Note  04/16/2017 3:28 PM Travis [name]  MRN:  [id number] Subjective:  Patient states " I am ok, I think the voices are getting better."  Objective:Patient seen and chart reviewed.Discussed patient with treatment team.  Pt seen as withdrawn, speech is not very spontaneous . Pt continues to be paranoid , but is improving.Pt reports AH Travis [name] as better. Pt had labs drawn , his creatinine is elevated - 3.31 , it was 1.27 3 days ago. Pt also with hypokalemia . Pt has UA that shows bacteria . Pt did not express any Urinary sx to writer this AM. Discussed with Aggie NP to consult hospitalist for further recommendations.    Principal Problem: Schizoaffective disorder ([place]) Diagnosis:   Patient Active Problem List   Diagnosis Date Noted  . Schizoaffective disorder ([place]) [F25.9] 04/14/2017  . AKI (acute kidney injury) ([place]) [N17.9]   . Lactic acid acidosis [E87.2]   . Left sided numbness [R20.0]   . Sepsis ([place]) [A41.9]   . Elevated lactic acid level [R79.89]   . Leukocytosis [D72.829]   . Volume depletion [E86.9]   . Hypotension [I95.9] 03/13/2017  . Chest pain with moderate risk for cardiac etiology [R07.9] 01/13/2014  . Dyspnea [R06.00] 01/13/2014  . Family history of coronary artery disease- (mother had MI 30) [Z82.49] 01/13/2014  . Tobacco chew use [Z72.0]   . NIDDM (non-insulin dependent diabetes mellitus) [E11.9]   . Dyslipidemia [E78.5]   . Unspecified vitamin D deficiency [E55.9]   . 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 ESOPHAGEAL REFLUX [K21.9] 09/28/2007  . DYSPHAGIA UNSPECIFIED [R13.10] 09/28/2007  . BLOOD IN STOOL, OCCULT [R19.5] 09/28/2007  . HERNIORRHAPHY, HX OF [Z98.89] 09/28/2007   Total Time spent with patient: 30 minutes  Past Psychiatric History: Please see H&P.  Past Medical History:  Past Medical History:  Diagnosis Date  . Dyslipidemia   . Fatigue    NAUSEA,BLOATING  . H. pylori infection 09/15/2006  .  Hematochezia 08/30/2007  . History of nuclear stress test 04/03/2009   exercise myoview; normal pattern of perfusion; low risk scan   . Hypertension    treated  . NIDDM (non-insulin dependent diabetes mellitus) 2011   type 2  . Schizoaffective disorder ([place])   . Tobacco chew use   . Unspecified vitamin D deficiency     Past Surgical History:  Procedure Laterality Date  . APPENDECTOMY    . HERNIA REPAIR     RIGHT  . KNEE SURGERY     BOTH  . TRANSTHORACIC ECHOCARDIOGRAM  04/03/2009   EF=>55%; trace TR, mild pulm regurg   Family History:  Family History  Problem Relation Age of Onset  . Stroke Father 62  . Pneumonia Father   . Heart failure Mother 83  . Coronary artery disease Mother        also MI  . Heart attack Mother   . Heart attack Maternal Uncle        died in 47s  . Cancer Unknown        x2; maternal side  . Diabetes Maternal Grandmother    Family Psychiatric  History: Please see H&P.  Social History: Please see H&P.  History  Alcohol Use No     History  Drug Use No    Social History   Social History  . Marital status: Married    Spouse name: N/A  . Number of children: 2  . Years of education: N/A   Occupational History  .  Unemployed   Social History Main Topics  . Smoking status: Never Smoker  . Smokeless tobacco: Current User    Types: Snuff, Chew     Comment: started chewing tobacco at age 85 years old-stopped chewing and dips snuff only  . Alcohol use No  . Drug use: No  . Sexual activity: Not Asked   Other Topics Concern  . None   Social History Narrative  . None   Additional Social History:                         Sleep: Fair  Appetite:  Fair  Current Medications: Current Facility-Administered Medications  Medication Dose Route Frequency Provider Last Rate Last Dose  . acetaminophen (TYLENOL) tablet 650 mg  650 mg Oral Q6H PRN Okonkwo, Justina A, NP      . amLODipine (NORVASC) tablet 10 mg  10 mg Oral Daily Okonkwo,  Justina A, NP   10 mg at 04/16/17 0804  . aspirin EC tablet 81 mg  81 mg Oral Daily Okonkwo, Justina A, NP   81 mg at 04/16/17 0804  . benztropine (COGENTIN) tablet 0.5 mg  0.5 mg Oral QHS Kiran Lapine, MD   0.5 mg at 04/15/17 2145  . FLUoxetine (PROZAC) capsule 20 mg  20 mg Oral Daily Adyn Serna, Ria Clock, MD   20 mg at 04/16/17 0805  . hydrOXYzine (ATARAX/VISTARIL) tablet 25 mg  25 mg Oral TID PRN Lu Duffel, Justina A, NP      . loratadine (CLARITIN) tablet 10 mg  10 mg Oral Daily Okonkwo, Justina A, NP   10 mg at 04/16/17 0804  . LORazepam (ATIVAN) tablet 1 mg  1 mg Oral Q6H PRN Maximum Reiland, MD      . nicotine polacrilex (NICORETTE) gum 2 mg  2 mg Oral PRN Patriciaann Clan E, PA-C      . nitroGLYCERIN (NITROSTAT) SL tablet 0.4 mg  0.4 mg Sublingual Q5 min PRN Okonkwo, Justina A, NP      . pantoprazole (PROTONIX) EC tablet 40 mg  40 mg Oral Daily Okonkwo, Justina A, NP   40 mg at 04/16/17 0805  . risperiDONE (RISPERDAL M-TABS) disintegrating tablet 1 mg  1 mg Oral TID PRN Ursula Alert, MD      . risperiDONE (RISPERDAL) tablet 0.5 mg  0.5 mg Oral Q lunch Enslee Bibbins, MD   0.5 mg at 04/16/17 1152  . risperiDONE (RISPERDAL) tablet 1 mg  1 mg Oral QHS Terion Hedman, MD      . tamsulosin (FLOMAX) capsule 0.4 mg  0.4 mg Oral QPC breakfast Okonkwo, Justina A, NP   0.4 mg at 04/16/17 0804  . traZODone (DESYREL) tablet 100 mg  100 mg Oral QHS Okonkwo, Justina A, NP   100 mg at 04/15/17 2145    Lab Results:  Results for orders placed or performed during the hospital encounter of 04/14/17 (from the past 48 hour(s))  Glucose, capillary     Status: Abnormal   Collection Time: 04/14/17  5:23 PM  Result Value Ref Range   Glucose-Capillary 123 (H) 65 - 99 mg/dL  Glucose, capillary     Status: Abnormal   Collection Time: 04/15/17  6:14 AM  Result Value Ref Range   Glucose-Capillary 131 (H) 65 - 99 mg/dL  Urinalysis, Complete w Microscopic     Status: Abnormal   Collection Time: 04/15/17  1:48 PM   Result Value Ref Range   Color, Urine AMBER (A) YELLOW  Comment: BIOCHEMICALS MAY BE AFFECTED BY COLOR   APPearance TURBID (A) CLEAR   Specific Gravity, Urine 1.020 1.005 - 1.030   pH 5.0 5.0 - 8.0   Glucose, UA NEGATIVE NEGATIVE mg/dL   Hgb urine dipstick NEGATIVE NEGATIVE   Bilirubin Urine NEGATIVE NEGATIVE   Ketones, ur 5 (A) NEGATIVE mg/dL   Protein, ur NEGATIVE NEGATIVE mg/dL   Nitrite NEGATIVE NEGATIVE   Leukocytes, UA TRACE (A) NEGATIVE   RBC / HPF 0-5 0 - 5 RBC/hpf   WBC, UA 0-5 0 - 5 WBC/hpf   Bacteria, UA MANY (A) NONE SEEN   Squamous Epithelial / LPF 0-5 (A) NONE SEEN   Mucus PRESENT     Comment: Performed at [facility], [place] [address] [place], [place] [address]  Glucose, capillary     Status: Abnormal   Collection Time: 04/15/17  4:31 PM  Result Value Ref Range   Glucose-Capillary 118 (H) 65 - 99 mg/dL  Glucose, capillary     Status: Abnormal   Collection Time: 04/16/17  6:26 AM  Result Value Ref Range   Glucose-Capillary 103 (H) 65 - 99 mg/dL   Comment 1 Notify RN   TSH     Status: None   Collection Time: 04/16/17  6:36 AM  Result Value Ref Range   TSH 1.766 0.350 - 4.500 uIU/mL    Comment: Performed by a 3rd Generation assay with a functional sensitivity of <=0.01 uIU/mL. Performed at [facility], [place] [address], [place], [place] [address]   Lipid panel     Status: Abnormal   Collection Time: 04/16/17  6:36 AM  Result Value Ref Range   Cholesterol 152 0 - 200 mg/dL   Triglycerides 209 (H) <150 mg/dL   HDL 23 (L) >40 mg/dL   Total CHOL/HDL Ratio 6.6 RATIO   VLDL 42 (H) 0 - 40 mg/dL   LDL Cholesterol 87 0 - 99 mg/dL    Comment:        Total Cholesterol/HDL:CHD Risk Coronary Heart Disease Risk Table                     Men   Women  1/2 Average Risk   3.4   3.3  Average Risk       5.0   4.4  2 X Average Risk   9.6   7.1  3 X Average Risk  23.4   11.0        Use the calculated Patient Ratio above and the CHD  Risk Table to determine the patient's CHD Risk.        ATP III CLASSIFICATION (LDL):  <100     mg/dL   Optimal  100-129  mg/dL   Near or Above                    Optimal  130-159  mg/dL   Borderline  160-189  mg/dL   High  >190     mg/dL   Very High Performed at [place] [address] [place], [place] [address]   Hemoglobin A1c     Status: Abnormal   Collection Time: 04/16/17  6:36 AM  Result Value Ref Range   Hgb A1c MFr Bld 6.3 (H) 4.8 - 5.6 %    Comment: (NOTE) Pre diabetes:          5.7%-6.4% Diabetes:              >6.4%  Glycemic control for   <7.0% adults with diabetes    Mean Plasma Glucose 134.11 mg/dL    Comment: Performed at [place] [address], [place], [place] [address]  CBC with Differential/Platelet     Status: Abnormal   Collection Time: 04/16/17  6:36 AM  Result Value Ref Range   WBC 14.8 (H) 4.0 - 10.5 K/uL   RBC 5.25 4.22 - 5.81 MIL/uL   Hemoglobin 15.3 13.0 - 17.0 g/dL   HCT 45.0 39.0 - 52.0 %   MCV 85.7 78.0 - 100.0 fL   MCH 29.1 26.0 - 34.0 pg   MCHC 34.0 30.0 - 36.0 g/dL   RDW 13.4 11.5 - 15.5 %   Platelets 263 150 - 400 K/uL   Neutrophils Relative % 59 %   Neutro Abs 8.7 (H) 1.7 - 7.7 K/uL   Lymphocytes Relative 28 %   Lymphs Abs 4.2 (H) 0.7 - 4.0 K/uL   Monocytes Relative 9 %   Monocytes Absolute 1.4 (H) 0.1 - 1.0 K/uL   Eosinophils Relative 3 %   Eosinophils Absolute 0.5 0.0 - 0.7 K/uL   Basophils Relative 1 %   Basophils Absolute 0.1 0.0 - 0.1 K/uL    Comment: Performed at [facility], [place] [address] [place], [place] [address]  Comprehensive metabolic panel     Status: Abnormal   Collection Time: 04/16/17  6:36 AM  Result Value Ref Range   Sodium 141 135 - 145 mmol/L   Potassium 3.3 (L) 3.5 - 5.1 mmol/L   Chloride 98 (L) 101 - 111 mmol/L   CO2 28 22 - 32 mmol/L   Glucose, Bld 109 (H) 65 - 99 mg/dL   BUN 54 (H) 6 - 20 mg/dL   Creatinine, Ser 3.31 (H) 0.61 - 1.24 mg/dL   Calcium 9.9 8.9 -  10.3 mg/dL   Total Protein 7.6 6.5 - 8.1 g/dL   Albumin 4.3 3.5 - 5.0 g/dL   AST 24 15 - 41 U/L   ALT 31 17 - 63 U/L   Alkaline Phosphatase 55 38 - 126 U/L   Total Bilirubin 0.7 0.3 - 1.2 mg/dL   GFR calc non Af Amer 19 (L) >60 mL/min   GFR calc Af Amer 22 (L) >60 mL/min    Comment: (NOTE) The eGFR has been calculated using the CKD EPI equation. This calculation has not been validated in all clinical situations. eGFR's persistently <60 mL/min signify possible Chronic Kidney Disease.    Anion gap 15 5 - 15    Comment: Performed at [facility], [place] [address], [place], [place] [address]    Blood Alcohol level:  Lab Results  Component Value Date   ETH <5 93/57/0177    Metabolic Disorder Labs: Lab Results  Component Value Date   HGBA1C 6.3 (H) 04/16/2017   MPG 134.11 04/16/2017   No results found for: PROLACTIN Lab Results  Component Value Date   CHOL 152 04/16/2017   TRIG 209 (H) 04/16/2017   HDL 23 (L) 04/16/2017   CHOLHDL 6.6 04/16/2017   VLDL 42 (H) 04/16/2017   LDLCALC 87 04/16/2017    Physical Findings: AIMS: Facial and Oral Movements Muscles of Facial Expression: None, normal Lips and Perioral Area: None, normal Jaw: None, normal Tongue: None, normal,Extremity Movements Upper (arms, wrists, hands, fingers): None, normal Lower (legs, knees, ankles, toes): None, normal, Trunk Movements Neck, shoulders, hips: None, normal, Overall Severity Severity of abnormal movements (highest score from questions  above): None, normal Incapacitation due to abnormal movements: None, normal Patient's awareness of abnormal movements (rate only patient's report): No Awareness, Dental Status Current problems with teeth and/or dentures?: No Does patient usually wear dentures?: No  CIWA:  CIWA-Ar Total: 0 COWS:     Musculoskeletal: Strength & Muscle Tone: within normal limits Gait & Station: normal Patient leans: N/A  Psychiatric Specialty Exam: Physical  Exam  Nursing note and vitals reviewed.   Review of Systems  Psychiatric/Behavioral: Positive for depression and hallucinations. The patient is nervous/anxious.   All other systems reviewed and are negative.   Blood pressure 108/86, pulse (!) 118, temperature 98.6 F (37 C), temperature source Oral, resp. rate 16, height _0  (1.651 m), weight 84.4 kg (186 lb).Body mass index is 30.95 kg/m.  General Appearance: Guarded  Eye Contact:  Fair  Speech:  Normal Rate  Volume:  Decreased  Mood:  Anxious and Dysphoric  Affect:  Congruent  Thought Process:  Goal Directed and Descriptions of Associations: Circumstantial  Orientation:  Full (Time, Place, and Person)  Thought Content:  Hallucinations: Auditory, Paranoid Ideation and Rumination  Suicidal Thoughts:  No  Homicidal Thoughts:  No  Memory:  Immediate;   Fair Recent;   Fair Remote;   Fair  Judgement:  Fair  Insight:  Shallow  Psychomotor Activity:  Normal  Concentration:  Concentration: Fair and Attention Span: Fair  Recall:  [employer] of Knowledge:  Fair  Language:  Fair  Akathisia:  No  Handed:  Right  AIMS (if indicated):     Assets:  Desire for Improvement  ADL's:  Intact  Cognition:  WNL  Sleep:  Number of Hours: 6.5     Treatment Plan Summary:Patient with psychosis, depression, SI , currently is making progress. Pt reports improvement with AH/VH. Pt however with elevated Creatinine level , low K+ , UA - abnormal. Discussed with pharmacist Ermalinda Barrios - will start abx for possible UTI. Aggie to consult hospitalist.  Daily contact with patient to assess and evaluate symptoms and progress in treatment, Medication management and Plan see below   Increase Risperidone to 0.5 mg po daily and 1 mg po qhs for psychosis/mood sx. Prozac 20 mg po daily for mood sx. Trazodone 100 mg po qhs for sleep issues. Continue CIWA/ativan protocol for possible BZD withdrawal sx. Spoke to [employer] , Johnson who works at [employer] team 2 -  regarding patient's BMP- creatinine , pt is currently accepted for medical admission . Spoke to [place] , she will work on transferring patient.    Yosef Krogh, MD 04/16/2017, 3:28 PM

## 2017-04-16 NOTE — H&P (Addendum)
History and Physical    Travis [name] YSA:[id number] DOB: 1960/12/07 DOA: (Not on file)  Referring MD/NP/PA: EDP PCP: unknown Patient coming from: [facility]  Chief Complaint: abnormal labs  HPI: Travis [name] is a 56 y.o. male with medical history significant for diabetes mellitus, hypertension, schizoaffective disorder, depression, history of right knee injury/surgery, admitted to behavioral health on 8/21 with depression, suicidal ideation and some questionable visual hallucinations. Patient had been waiting in the emergency room since 8/19 for behavioral health bed and finally admitted on 8/21. Psych M.D. did labs this morning which was notable for a creatinine of 3.4 and mild hypokalemia which is considerably abnormal from prior labs on 8/19 which noted a creatinine of 1.2. Review of medication notable for indomethacin and metformin. Patient denied any diarrhea or vomiting he admits to poor fluid intake  Review of Systems: As per HPI otherwise 14 point review of systems negative.   Past Medical History:  Diagnosis Date  . Dyslipidemia   . Fatigue    NAUSEA,BLOATING  . H. pylori infection 09/15/2006  . Hematochezia 08/30/2007  . History of nuclear stress test 04/03/2009   exercise myoview; normal pattern of perfusion; low risk scan   . Hypertension    treated  . NIDDM (non-insulin dependent diabetes mellitus) 2011   type 2  . Schizoaffective disorder ([place])   . Tobacco chew use   . Unspecified vitamin D deficiency     Past Surgical History:  Procedure Laterality Date  . APPENDECTOMY    . HERNIA REPAIR     RIGHT  . KNEE SURGERY     BOTH  . TRANSTHORACIC ECHOCARDIOGRAM  04/03/2009   EF=>55%; trace TR, mild pulm regurg     reports that he has never smoked. His smokeless tobacco use includes Snuff and Chew. He reports that he does not drink alcohol or use drugs.  Allergies  Allergen Reactions  . Doxycycline Other (See Comments)    Reaction:  Blisters   . Lisinopril  Swelling    Family History  Problem Relation Age of Onset  . Stroke Father 7  . Pneumonia Father   . Heart failure Mother 26  . Coronary artery disease Mother        also MI  . Heart attack Mother   . Heart attack Maternal Uncle        died in 43s  . Cancer Unknown        x2; maternal side  . Diabetes Maternal Grandmother      Prior to Admission medications   Medication Sig Start Date End Date Taking? Authorizing Provider  acetaminophen (TYLENOL) 325 MG tablet Take 650 mg by mouth every 6 (six) hours as needed for mild pain.    [provider]  amLODipine (NORVASC) 10 MG tablet Take 10 mg by mouth daily. 02/01/17   [provider]  aspirin EC 81 MG tablet Take 81 mg by mouth daily.    [provider]  glipiZIDE (GLUCOTROL XL) 10 MG 24 hr tablet Take 10 mg by mouth daily.     [provider]  HYDROcodone-acetaminophen (NORCO/VICODIN) 5-325 MG tablet Take 1 tablet by mouth daily as needed. 04/11/17   [provider]  indomethacin (INDOCIN) 25 MG capsule Take 50 mg by mouth 2 (two) times daily with a meal.     [provider]  loratadine (CLARITIN) 10 MG tablet Take 10 mg by mouth daily.    [provider]  metFORMIN (GLUCOPHAGE) 1000 MG tablet Take  1,000 mg by mouth daily.     [provider]  nitroGLYCERIN (NITROSTAT) 0.4 MG SL tablet Place 0.4 mg under the tongue every 5 (five) minutes as needed for chest pain.    [provider]  pantoprazole (PROTONIX) 40 MG tablet Take 1 tablet (40 mg total) by mouth daily. 03/19/17   Donne Hazel, MD  saccharomyces boulardii (FLORASTOR) 250 MG capsule Take 1 capsule (250 mg total) by mouth 2 (two) times daily. Patient taking differently: Take 250 mg by mouth daily.  03/18/17   Donne Hazel, MD  tamsulosin (FLOMAX) 0.4 MG CAPS capsule Take 0.4 mg by mouth daily after breakfast.    [provider]  traZODone (DESYREL) 100 MG tablet Take 100 mg by mouth at  bedtime.     [provider]    Physical Exam: There were no vitals filed for this visit.    Constitutional: NAD, calm, comfortable There were no vitals filed for this visit. Eyes: PERRL, lids and conjunctivae normal ENMT: Mucous membranes are moist. Posterior pharynx clear of any exudate or lesions.Normal dentition.  Neck: normal, supple, no masses, no thyromegaly Respiratory: clear to auscultation bilaterally, no wheezing, no crackles. Normal respiratory effort. No accessory muscle use.  Cardiovascular: Regular rate and rhythm, no murmurs / rubs / gallops. No extremity edema. 2+ pedal pulses. No carotid bruits.  Abdomen: no tenderness, no masses palpated. No hepatosplenomegaly. Bowel sounds positive.  Musculoskeletal: no clubbing / cyanosis. No joint deformity upper and lower extremities. Good ROM, no contractures. Normal muscle tone.  Skin: no rashes, lesions, ulcers. No induration Neurologic: CN 2-12 grossly intact. Sensation intact, DTR normal. Strength 5/5 in all 4.  Psychiatric: Normal judgment and insight. Alert and oriented x 3. Normal mood.   Labs on Admission: I have personally reviewed following labs and imaging studies  CBC:  Recent Labs Lab 04/13/17 2049 04/16/17 0636  WBC 16.8* 14.8*  NEUTROABS  --  8.7*  HGB 15.9 15.3  HCT 46.2 45.0  MCV 85.9 85.7  PLT 254 702   Basic Metabolic Panel:  Recent Labs Lab 04/13/17 2049 04/16/17 0636  NA 141 141  K 3.1* 3.3*  CL 101 98*  CO2 26 28  GLUCOSE 155* 109*  BUN 26* 54*  CREATININE 1.27* 3.31*  CALCIUM 10.0 9.9   GFR: Estimated Creatinine Clearance: 24.6 mL/min (A) (by C-G formula based on SCr of 3.31 mg/dL (H)). Liver Function Tests:  Recent Labs Lab 04/13/17 2049 04/16/17 0636  AST 25 24  ALT 40 31  ALKPHOS 57 55  BILITOT 0.7 0.7  PROT 8.3* 7.6  ALBUMIN 4.6 4.3   No results for input(s): LIPASE, AMYLASE in the last 168 hours. No results for input(s): AMMONIA in the last 168  hours. Coagulation Profile: No results for input(s): INR, PROTIME in the last 168 hours. Cardiac Enzymes: No results for input(s): CKTOTAL, CKMB, CKMBINDEX, TROPONINI in the last 168 hours. BNP (last 3 results) No results for input(s): PROBNP in the last 8760 hours. HbA1C:  Recent Labs  04/16/17 0636  HGBA1C 6.3*   CBG:  Recent Labs Lab 04/14/17 1723 04/15/17 0614 04/15/17 1631 04/16/17 0626  GLUCAP 123* 131* 118* 103*   Lipid Profile:  Recent Labs  04/16/17 0636  CHOL 152  HDL 23*  LDLCALC 87  TRIG 209*  CHOLHDL 6.6   Thyroid Function Tests:  Recent Labs  04/16/17 0636  TSH 1.766   Anemia Panel: No results for input(s): VITAMINB12, FOLATE, FERRITIN, TIBC, IRON, RETICCTPCT in  the last 72 hours. Urine analysis:    Component Value Date/Time   COLORURINE AMBER (A) 04/15/2017 1348   APPEARANCEUR TURBID (A) 04/15/2017 1348   LABSPEC 1.020 04/15/2017 1348   PHURINE 5.0 04/15/2017 1348   GLUCOSEU NEGATIVE 04/15/2017 1348   HGBUR NEGATIVE 04/15/2017 1348   BILIRUBINUR NEGATIVE 04/15/2017 1348   KETONESUR 5 (A) 04/15/2017 1348   PROTEINUR NEGATIVE 04/15/2017 1348   NITRITE NEGATIVE 04/15/2017 1348   LEUKOCYTESUR TRACE (A) 04/15/2017 1348   Sepsis Labs: @LABRCNTIP (procalcitonin:4,lacticidven:4) )No results found for this or any previous visit (from the past 240 hour(s)).   Radiological Exams on Admission: No results found.  EKG: Independently reviewed. Sinus tachycardia, no acute ST T wave changes  Assessment/Plan Principal Problem:   AKI (acute kidney injury) ([place]) -On chronic kidney disease stage II -Baseline creatinine around 1.3-1.4 -Today's labs notable for creatinine of 3.3 with mild hypokalemia, likley due to poor Po intake, NSAID use etc -Admit to [facility] start him on IV fluids, stop NSAIDs and metformin -Check renal ultrasound and urinalysis for proteinuria   DM2 -hold glipizide -SSI for now    Esophageal reflux -PPI     Schizoaffective disorder ([place]) with major depression and Suicidal ideation -resume risperidone, prozac and trazodone per psych recs -1:1 sitter, asked Psych MD to have Psych follow pt when he's here and they will save his bed so he can be transferred back when AKI better  DVT prophylaxis: lovenox Code Status: Full code Family Communication: none at bedside Disposition Plan: none at bedside Consults called: Psych to follow Admission status: inpatient   Domenic Polite MD [facility] Pager [phone]  If 7PM-7AM, please contact night-coverage [email or web address] Password [facility]  04/16/2017, 4:44 PM

## 2017-04-16 NOTE — Progress Notes (Signed)
D: Pt presents with a flat affect and depressed mood. Pt appears to have some mild confusion. Pt verbalized feeling confused today. Pt alert and oriented to person, place and situation. Pt confused to time. Pt reports seeing people and hearing voices. Pt is paranoid and constantly stating "are you going to kick me out, I don't want to go to jail, please don't make me leave". Pt continues to pace the hallway and noted to be fidgety throughout the day. Pt denies SI/HI and verbally contracts for safety.  A: EKG competed and given to Dr. Shea Evans. Medications reviewed with pt. Medications administered as ordered per MD. Verbal support provided. 15 minute checks performed for safety. R: Pt compliant with tx.

## 2017-04-17 ENCOUNTER — Inpatient Hospital Stay (HOSPITAL_COMMUNITY): Payer: 59

## 2017-04-17 ENCOUNTER — Encounter (HOSPITAL_COMMUNITY): Payer: Self-pay

## 2017-04-17 DIAGNOSIS — Z560 Unemployment, unspecified: Secondary | ICD-10-CM

## 2017-04-17 DIAGNOSIS — R443 Hallucinations, unspecified: Secondary | ICD-10-CM

## 2017-04-17 DIAGNOSIS — F17220 Nicotine dependence, chewing tobacco, uncomplicated: Secondary | ICD-10-CM

## 2017-04-17 DIAGNOSIS — E872 Acidosis: Secondary | ICD-10-CM

## 2017-04-17 DIAGNOSIS — F333 Major depressive disorder, recurrent, severe with psychotic symptoms: Secondary | ICD-10-CM

## 2017-04-17 DIAGNOSIS — R200 Anesthesia of skin: Secondary | ICD-10-CM

## 2017-04-17 DIAGNOSIS — E876 Hypokalemia: Secondary | ICD-10-CM

## 2017-04-17 LAB — GLUCOSE, CAPILLARY
Glucose-Capillary: 109 mg/dL — ABNORMAL HIGH (ref 65–99)
Glucose-Capillary: 109 mg/dL — ABNORMAL HIGH (ref 65–99)
Glucose-Capillary: 134 mg/dL — ABNORMAL HIGH (ref 65–99)
Glucose-Capillary: 89 mg/dL (ref 65–99)
Glucose-Capillary: 97 mg/dL (ref 65–99)

## 2017-04-17 LAB — BASIC METABOLIC PANEL
Anion gap: 12 (ref 5–15)
BUN: 62 mg/dL — ABNORMAL HIGH (ref 6–20)
CO2: 27 mmol/L (ref 22–32)
Calcium: 9.2 mg/dL (ref 8.9–10.3)
Chloride: 100 mmol/L — ABNORMAL LOW (ref 101–111)
Creatinine, Ser: 2.17 mg/dL — ABNORMAL HIGH (ref 0.61–1.24)
GFR calc Af Amer: 37 mL/min — ABNORMAL LOW (ref >60)
GFR calc non Af Amer: 32 mL/min — ABNORMAL LOW (ref >60)
Glucose, Bld: 76 mg/dL (ref 65–99)
Potassium: 3.1 mmol/L — ABNORMAL LOW (ref 3.5–5.1)
Sodium: 139 mmol/L (ref 135–145)

## 2017-04-17 LAB — CBC
HCT: 39.5 % (ref 39.0–52.0)
Hemoglobin: 13.6 g/dL (ref 13.0–17.0)
MCH: 29.4 pg (ref 26.0–34.0)
MCHC: 34.4 g/dL (ref 30.0–36.0)
MCV: 85.3 fL (ref 78.0–100.0)
Platelets: 215 10*3/uL (ref 150–400)
RBC: 4.63 MIL/uL (ref 4.22–5.81)
RDW: 13.3 % (ref 11.5–15.5)
WBC: 10.8 10*3/uL — ABNORMAL HIGH (ref 4.0–10.5)

## 2017-04-17 MED ORDER — SENNOSIDES-DOCUSATE SODIUM 8.6-50 MG PO TABS
1.0000 | ORAL_TABLET | Freq: Two times a day (BID) | ORAL | Status: DC
  Administered 2017-04-17 – 2017-04-18 (×3): 1 via ORAL
  Filled 2017-04-17 (×3): qty 1

## 2017-04-17 MED ORDER — POLYETHYLENE GLYCOL 3350 17 G PO PACK
17.0000 g | PACK | Freq: Every day | ORAL | Status: DC
  Administered 2017-04-17 – 2017-04-18 (×2): 17 g via ORAL
  Filled 2017-04-17 (×2): qty 1

## 2017-04-17 MED ORDER — POTASSIUM CHLORIDE CRYS ER 20 MEQ PO TBCR
40.0000 meq | EXTENDED_RELEASE_TABLET | Freq: Once | ORAL | Status: AC
  Administered 2017-04-17: 40 meq via ORAL
  Filled 2017-04-17: qty 2

## 2017-04-17 NOTE — Consult Note (Signed)
[place] [facility] Consult   Reason for Consult:  Depression and suicide ideation Referring Physician:  Dr. Broadus John Patient Identification: Travis [name] MRN:  [id number] Principal Diagnosis: AKI (acute kidney injury) [facility]) Diagnosis:   Patient Active Problem List   Diagnosis Date Noted  . Depression [F32.9] 04/16/2017  . Schizoaffective disorder ([place]) [F25.9] 04/14/2017  . AKI (acute kidney injury) ([place]) [N17.9]   . Lactic acid acidosis [E87.2]   . Left sided numbness [R20.0]   . Sepsis ([place]) [A41.9]   . Elevated lactic acid level [R79.89]   . Leukocytosis [D72.829]   . Volume depletion [E86.9]   . Hypotension [I95.9] 03/13/2017  . Chest pain with moderate risk for cardiac etiology [R07.9] 01/13/2014  . Dyspnea [R06.00] 01/13/2014  . Family history of coronary artery disease- (mother had MI 95) [Z82.49] 01/13/2014  . Tobacco chew use [Z72.0]   . Type 2 diabetes mellitus ([place]) [E11.9]   . Dyslipidemia [E78.5]   . Unspecified vitamin D deficiency [E55.9]   . 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 Esophageal reflux [K21.9] 09/28/2007  . DYSPHAGIA UNSPECIFIED [R13.10] 09/28/2007  . BLOOD IN STOOL, OCCULT [R19.5] 09/28/2007  . HERNIORRHAPHY, HX OF [Z98.89] 09/28/2007    Total Time spent with patient: 1 hour  Subjective:   Travis [name] is a 56 y.o. male patient admitted with depression, hallucinations and AKI.  HPI:  Travis [name] is a 56 y.o. male, seen, chart reviewed for this face-to-face psychiatric consultation and evaluation of increased symptoms of depression with suicidal ideation and hallucinations. Patient was initially admitted to the behavioral health hospitalization on 04/15/2017 and then found having elevated creatinine and hypokalemia required medical attention so he was discharged to [facility] for further treatment needs. Patient has been rehydrated with IV fluids and increased oral fluids at this time.  Patient continued to endorse symptoms of depression and suicidal ideation and meet criteria for inpatient psychiatric hospitalization when medically stable. Patient denied symptoms of mania, posttraumatic stress disorder and paranoid schizophrenia.  Past Psychiatric History: Depression, hallucinations and suicide ideation. Pt reports several IP admissions in the past, but unable to give further details. Pt denies substance abuse issues , but his UDS is positive for BZD.  Risk to Self: Is patient at risk for suicide?: Yes Risk to Others:   Prior Inpatient Therapy:   Prior Outpatient Therapy:    Past Medical History:  Past Medical History:  Diagnosis Date  . Dyslipidemia   . Fatigue    NAUSEA,BLOATING  . H. pylori infection 09/15/2006  . Hematochezia 08/30/2007  . History of nuclear stress test 04/03/2009   exercise myoview; normal pattern of perfusion; low risk scan   . Hypertension    treated  . NIDDM (non-insulin dependent diabetes mellitus) 2011   type 2  . Schizoaffective disorder ([place])   . Tobacco chew use   . Unspecified vitamin D deficiency     Past Surgical History:  Procedure Laterality Date  . APPENDECTOMY    . HERNIA REPAIR     RIGHT  . KNEE SURGERY     BOTH  . TRANSTHORACIC ECHOCARDIOGRAM  04/03/2009   EF=>55%; trace TR, mild pulm regurg   Family History:  Family History  Problem Relation Age of Onset  . Stroke Father 43  . Pneumonia Father   . Heart failure Mother 52  . Coronary artery disease Mother        also MI  . Heart attack Mother   . Heart  attack Maternal Uncle        died in 71s  . Cancer Unknown        x2; maternal side  . Diabetes Maternal Grandmother    Family Psychiatric  History: denied Social History:  History  Alcohol Use No     History  Drug Use No    Social History   Social History  . Marital status: Married    Spouse name: N/A  . Number of children: 2  . Years of education: N/A   Occupational History  .  Unemployed    Social History Main Topics  . Smoking status: Never Smoker  . Smokeless tobacco: Current User    Types: Snuff, Chew     Comment: started chewing tobacco at age 29 years old-stopped chewing and dips snuff only  . Alcohol use No  . Drug use: No  . Sexual activity: Not on file   Other Topics Concern  . Not on file   Social History Narrative  . No narrative on file   Additional Social History:    Allergies:   Allergies  Allergen Reactions  . Doxycycline Other (See Comments)    Reaction:  Blisters   . Lisinopril Swelling    Labs:  Results for orders placed or performed during the hospital encounter of 04/16/17 (from the past 48 hour(s))  CBC     Status: Abnormal   Collection Time: 04/16/17  6:16 PM  Result Value Ref Range   WBC 15.6 (H) 4.0 - 10.5 K/uL   RBC 5.05 4.22 - 5.81 MIL/uL   Hemoglobin 14.8 13.0 - 17.0 g/dL   HCT 43.0 39.0 - 52.0 %   MCV 85.1 78.0 - 100.0 fL   MCH 29.3 26.0 - 34.0 pg   MCHC 34.4 30.0 - 36.0 g/dL   RDW 13.3 11.5 - 15.5 %   Platelets 224 150 - 400 K/uL  Creatinine, serum     Status: Abnormal   Collection Time: 04/16/17  6:16 PM  Result Value Ref Range   Creatinine, Ser 3.33 (H) 0.61 - 1.24 mg/dL   GFR calc non Af Amer 19 (L) >60 mL/min   GFR calc Af Amer 22 (L) >60 mL/min    Comment: (NOTE) The eGFR has been calculated using the CKD EPI equation. This calculation has not been validated in all clinical situations. eGFR's persistently <60 mL/min signify possible Chronic Kidney Disease.   Glucose, capillary     Status: None   Collection Time: 04/16/17  8:52 PM  Result Value Ref Range   Glucose-Capillary 84 65 - 99 mg/dL   Comment 1 Document in Chart   Glucose, capillary     Status: Abnormal   Collection Time: 04/16/17 10:58 PM  Result Value Ref Range   Glucose-Capillary 63 (L) 65 - 99 mg/dL   Comment 1 Document in Chart   Glucose, capillary     Status: None   Collection Time: 04/16/17 11:31 PM  Result Value Ref Range    Glucose-Capillary 91 65 - 99 mg/dL  Glucose, capillary     Status: Abnormal   Collection Time: 04/17/17 12:02 AM  Result Value Ref Range   Glucose-Capillary 109 (H) 65 - 99 mg/dL  CBC     Status: Abnormal   Collection Time: 04/17/17  4:20 AM  Result Value Ref Range   WBC 10.8 (H) 4.0 - 10.5 K/uL   RBC 4.63 4.22 - 5.81 MIL/uL   Hemoglobin 13.6 13.0 - 17.0 g/dL   HCT 39.5  39.0 - 52.0 %   MCV 85.3 78.0 - 100.0 fL   MCH 29.4 26.0 - 34.0 pg   MCHC 34.4 30.0 - 36.0 g/dL   RDW 13.3 11.5 - 15.5 %   Platelets 215 150 - 400 K/uL  Basic metabolic panel     Status: Abnormal   Collection Time: 04/17/17  4:20 AM  Result Value Ref Range   Sodium 139 135 - 145 mmol/L   Potassium 3.1 (L) 3.5 - 5.1 mmol/L   Chloride 100 (L) 101 - 111 mmol/L   CO2 27 22 - 32 mmol/L   Glucose, Bld 76 65 - 99 mg/dL   BUN 62 (H) 6 - 20 mg/dL   Creatinine, Ser 2.17 (H) 0.61 - 1.24 mg/dL    Comment: DELTA CHECK NOTED REPEATED TO VERIFY    Calcium 9.2 8.9 - 10.3 mg/dL   GFR calc non Af Amer 32 (L) >60 mL/min   GFR calc Af Amer 37 (L) >60 mL/min    Comment: (NOTE) The eGFR has been calculated using the CKD EPI equation. This calculation has not been validated in all clinical situations. eGFR's persistently <60 mL/min signify possible Chronic Kidney Disease.    Anion gap 12 5 - 15  Glucose, capillary     Status: None   Collection Time: 04/17/17  7:52 AM  Result Value Ref Range   Glucose-Capillary 97 65 - 99 mg/dL  Glucose, capillary     Status: Abnormal   Collection Time: 04/17/17 11:58 AM  Result Value Ref Range   Glucose-Capillary 134 (H) 65 - 99 mg/dL   Comment 1 Notify RN    Comment 2 Document in Chart     Current Facility-Administered Medications  Medication Dose Route Frequency Provider Last Rate Last Dose  . 0.9 %  sodium chloride infusion   Intravenous Continuous Domenic Polite, MD 75 mL/hr at 04/17/17 0913    . acetaminophen (TYLENOL) tablet 650 mg  650 mg Oral Q6H PRN Domenic Polite, MD       . amLODipine (NORVASC) tablet 10 mg  10 mg Oral Daily Domenic Polite, MD   10 mg at 04/17/17 1916  . aspirin EC tablet 81 mg  81 mg Oral Daily Domenic Polite, MD   81 mg at 04/17/17 6060  . enoxaparin (LOVENOX) injection 30 mg  30 mg Subcutaneous Q24H Domenic Polite, MD   30 mg at 04/16/17 2132  . FLUoxetine (PROZAC) capsule 20 mg  20 mg Oral Daily Domenic Polite, MD   20 mg at 04/17/17 0904  . HYDROcodone-acetaminophen (NORCO/VICODIN) 5-325 MG per tablet 1 tablet  1 tablet Oral Q6H PRN Domenic Polite, MD      . insulin aspart (novoLOG) injection 0-9 Units  0-9 Units Subcutaneous TID WC Domenic Polite, MD   1 Units at 04/17/17 1212  . loratadine (CLARITIN) tablet 10 mg  10 mg Oral Daily Domenic Polite, MD   10 mg at 04/17/17 0904  . nitroGLYCERIN (NITROSTAT) SL tablet 0.4 mg  0.4 mg Sublingual Q5 min PRN Domenic Polite, MD      . ondansetron [facility]) tablet 4 mg  4 mg Oral Q6H PRN Domenic Polite, MD       Or  . ondansetron [facility]) injection 4 mg  4 mg Intravenous Q6H PRN Domenic Polite, MD      . pantoprazole (PROTONIX) EC tablet 40 mg  40 mg Oral Daily Domenic Polite, MD   40 mg at 04/17/17 0459  . polyethylene glycol (MIRALAX / GLYCOLAX) packet 17  g  17 g Oral Daily Domenic Polite, MD   17 g at 04/17/17 1039  . risperiDONE (RISPERDAL) tablet 0.5 mg  0.5 mg Oral Daily Domenic Polite, MD   0.5 mg at 04/17/17 0906  . risperiDONE (RISPERDAL) tablet 1 mg  1 mg Oral QHS Domenic Polite, MD   1 mg at 04/16/17 2132  . saccharomyces boulardii (FLORASTOR) capsule 250 mg  250 mg Oral BID Domenic Polite, MD   250 mg at 04/17/17 8270  . senna-docusate (Senokot-S) tablet 1 tablet  1 tablet Oral BID Domenic Polite, MD   1 tablet at 04/17/17 1038  . tamsulosin (FLOMAX) capsule 0.4 mg  0.4 mg Oral QPC breakfast Domenic Polite, MD   0.4 mg at 04/17/17 7867  . traZODone (DESYREL) tablet 50 mg  50 mg Oral QHS Domenic Polite, MD   50 mg at 04/16/17 2132    Musculoskeletal: Strength & Muscle  Tone: decreased Gait & Station: normal Patient leans: N/A  Psychiatric Specialty Exam: Physical Exam as per history and physical   ROS  No Fever-chills, No Headache, No changes with Vision or hearing, reports vertigo No problems swallowing food or Liquids, No Chest pain, Cough or Shortness of Breath, No Abdominal pain, No Nausea or Vommitting, Bowel movements are regular, No Blood in stool or Urine, No dysuria, No new skin rashes or bruises, No new joints pains-aches,  No new weakness, tingling, numbness in any extremity, No recent weight gain or loss, No polyuria, polydypsia or polyphagia,  A full 10 point Review of Systems was done, except as stated above, all other Review of Systems were negative.  Blood pressure 119/78, pulse 89, temperature 98.4 F (36.9 C), temperature source Oral, resp. rate 16, height 5' 6"  (1.676 m), weight 84 kg (185 lb 3 oz), SpO2 96 %.Body mass index is 29.89 kg/m.  General Appearance: Guarded  Eye Contact:  Good  Speech:  Clear and Coherent  Volume:  Decreased  Mood:  Depressed  Affect:  Constricted and Depressed  Thought Process:  Coherent and Goal Directed  Orientation:  Full (Time, Place, and Person)  Thought Content:  Rumination  Suicidal Thoughts:  Yes.  without intent/plan  Homicidal Thoughts:  No  Memory:  Immediate;   Good Recent;   Fair Remote;   Fair  Judgement:  Fair  Insight:  Fair  Psychomotor Activity:  Decreased  Concentration:  Concentration: Good and Attention Span: Fair  Recall:  [employer] of Knowledge:  Good  Language:  Good  Akathisia:  Negative  Handed:  Right  AIMS (if indicated):     Assets:  Communication Skills Desire for Improvement Financial Resources/Insurance Housing Leisure Time Resilience Social Support Transportation  ADL's:  Intact  Cognition:  WNL  Sleep:        Treatment Plan Summary: 56 years old male with the increased symptoms of depression, hallucinations and suicidal ideation  presented with the increase of creatinine level at the initial examination required transfer to the [facility] and dehydration. It is believed patient creatinine level abuse was increased because of dehydration secondary to disturbed appetite and oral intake.  Recommendation: Continue Air cabin crew as patient cannot contract for safety during this hospitalization. Patient will continue his current treatment plan and will be transferred to the behavioral health Hospital when medically stable. Daily contact with patient to assess and evaluate symptoms and progress in treatment and Medication management  Disposition: Recommend psychiatric Inpatient admission when medically cleared. Supportive therapy provided about ongoing stressors.  Ambrose Finland, MD 04/17/2017 12:42 PM

## 2017-04-17 NOTE — Progress Notes (Signed)
Took over care for pt. Agree with previous RN assessment. Pt sleeping in bed, sitter at bedside. Will continue to monitor.

## 2017-04-17 NOTE — Discharge Summary (Signed)
Physician Discharge Summary Note  Patient:  Travis [name] is an 56 y.o., male MRN:  [id number] DOB:  03/22/1961 Patient phone:  [phone] (home)  Patient address:   [place] [address],  Total Time spent with patient: 30 minutes  Date of Admission:  04/14/2017 Date of Discharge: 04/16/17  Reason for Admission:  psychosis  Principal Problem: Schizoaffective disorder [facility]) Discharge Diagnoses: Patient Active Problem List   Diagnosis Date Noted  . Depression [F32.9] 04/16/2017  . Schizoaffective disorder ([place]) [F25.9] 04/14/2017  . AKI (acute kidney injury) ([place]) [N17.9]   . Lactic acid acidosis [E87.2]   . Left sided numbness [R20.0]   . Sepsis ([place]) [A41.9]   . Elevated lactic acid level [R79.89]   . Leukocytosis [D72.829]   . Volume depletion [E86.9]   . Hypotension [I95.9] 03/13/2017  . Chest pain with moderate risk for cardiac etiology [R07.9] 01/13/2014  . Dyspnea [R06.00] 01/13/2014  . Family history of coronary artery disease- (mother had MI 32) [Z82.49] 01/13/2014  . Tobacco chew use [Z72.0]   . Type 2 diabetes mellitus ([place]) [E11.9]   . Dyslipidemia [E78.5]   . Unspecified vitamin D deficiency [E55.9]   . 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 Esophageal reflux [K21.9] 09/28/2007  . DYSPHAGIA UNSPECIFIED [R13.10] 09/28/2007  . BLOOD IN STOOL, OCCULT [R19.5] 09/28/2007  . HERNIORRHAPHY, HX OF [Z98.89] 09/28/2007    Past Psychiatric History: pls see HP  Past Medical History:  Past Medical History:  Diagnosis Date  . Dyslipidemia   . Fatigue    NAUSEA,BLOATING  . H. pylori infection 09/15/2006  . Hematochezia 08/30/2007  . History of nuclear stress test 04/03/2009   exercise myoview; normal pattern of perfusion; low risk scan   . Hypertension    treated  . NIDDM (non-insulin dependent diabetes mellitus) 2011   type 2  . Schizoaffective disorder ([place])   . Tobacco chew use   . Unspecified vitamin D deficiency      Past Surgical History:  Procedure Laterality Date  . APPENDECTOMY    . HERNIA REPAIR     RIGHT  . KNEE SURGERY     BOTH  . TRANSTHORACIC ECHOCARDIOGRAM  04/03/2009   EF=>55%; trace TR, mild pulm regurg   Family History:  Family History  Problem Relation Age of Onset  . Stroke Father 12  . Pneumonia Father   . Heart failure Mother 51  . Coronary artery disease Mother        also MI  . Heart attack Mother   . Heart attack Maternal Uncle        died in 34s  . Cancer Unknown        x2; maternal side  . Diabetes Maternal Grandmother    Family Psychiatric  History: PLS SEE HP Social History:  History  Alcohol Use No     History  Drug Use No    Social History   Social History  . Marital status: Married    Spouse name: N/A  . Number of children: 2  . Years of education: N/A   Occupational History  .  Unemployed   Social History Main Topics  . Smoking status: Never Smoker  . Smokeless tobacco: Current User    Types: Snuff, Chew     Comment: started chewing tobacco at age 68 years old-stopped chewing and dips snuff only  . Alcohol use No  . Drug use: No  . Sexual activity: Not Asked   Other  Topics Concern  . None   Social History Narrative  . None    Hospital Course:  Patient with past hx of psychosis , noncompliant with medications, presented to ED on 04/13/2017, was admitted and evaluated at [facility] on 04/15/17. Pt was started on Risperidone low dose of 0.5 mg po bid as well as prozac 20 mg . Pt was continued on his home medications per [facility]. Pt with multiple past hx of medical problems , including recent hospitalization for sepsis from GI source , and after discharge at that time developed septic arthrits , was on antibiotics for the same prior to admission. Pt on admission had Creatinine of 1.2 hypokalemia ,. Repeat labs repeated on 04/16/17 showed creatinine of 3.4 . Hence a hospitalist consult was placed and patient was transferred to Hospitalist service for  management of acute renal failure . Patient showed some improvement on his medications during his stay , his paranoia and AH was improved slightly. He will be followed by psychiatric consult service during his stay at [facility] .  Physical Findings: AIMS: Facial and Oral Movements Muscles of Facial Expression: None, normal Lips and Perioral Area: None, normal Jaw: None, normal Tongue: None, normal,Extremity Movements Upper (arms, wrists, hands, fingers): None, normal Lower (legs, knees, ankles, toes): None, normal, Trunk Movements Neck, shoulders, hips: None, normal, Overall Severity Severity of abnormal movements (highest score from questions above): None, normal Incapacitation due to abnormal movements: None, normal Patient's awareness of abnormal movements (rate only patient's report): No Awareness, Dental Status Current problems with teeth and/or dentures?: No Does patient usually wear dentures?: No  CIWA:  CIWA-Ar Total: 0 COWS:     Musculoskeletal: Strength & Muscle Tone: within normal limits Gait & Station: normal Patient leans: Right  Psychiatric Specialty Exam: Physical Exam  Nursing note and vitals reviewed.   Review of Systems  Psychiatric/Behavioral: Positive for depression and hallucinations. The patient is nervous/anxious.   All other systems reviewed and are negative.   Blood pressure 114/80, pulse (!) 118, temperature 98 F (36.7 C), temperature source Oral, resp. rate 16, height 5\' 5"  (1.651 m), weight 84.4 kg (186 lb), SpO2 100 %.Body mass index is 30.95 kg/m.  General Appearance: Casual  Eye Contact:  Fair  Speech:  Clear and Coherent  Volume:  Decreased  Mood:  Anxious and Depressed  Affect:  Congruent  Thought Process:  Goal Directed and Descriptions of Associations: Intact  Orientation:  Full (Time, Place, and Person)  Thought Content:  Hallucinations: Auditory and Paranoid Ideation  Suicidal Thoughts:  No  Homicidal Thoughts:  No  Memory:  Immediate;    Fair Recent;   Fair Remote;   Fair  Judgement:  Fair  Insight:  Fair  Psychomotor Activity:  Normal  Concentration:  Attention Span: Fair  Recall:  [employer] of Knowledge:  Fair  Language:  Fair  Akathisia:  No  Handed:  Right  AIMS (if indicated):     Assets:  Communication Skills  ADL's:  Intact  Cognition:  WNL  Sleep:  Number of Hours: 6.5     Have you used any form of tobacco in the last 30 days? (Cigarettes, Smokeless Tobacco, Cigars, and/or Pipes): Yes  Has this patient used any form of tobacco in the last 30 days? (Cigarettes, Smokeless Tobacco, Cigars, and/or Pipes) Yes, Yes, Prescription not provided because: pt declined  Blood Alcohol level:  Lab Results  Component Value Date   ETH <5 40/05/2724    Metabolic Disorder Labs:  Lab Results  Component Value Date   HGBA1C 6.3 (H) 04/16/2017   MPG 134.11 04/16/2017   No results found for: PROLACTIN Lab Results  Component Value Date   CHOL 152 04/16/2017   TRIG 209 (H) 04/16/2017   HDL 23 (L) 04/16/2017   CHOLHDL 6.6 04/16/2017   VLDL 42 (H) 04/16/2017   LDLCALC 87 04/16/2017    See Psychiatric Specialty Exam and Suicide Risk Assessment completed by Attending Physician prior to discharge.  Discharge destination:  Other:  [place] service  Is patient on multiple antipsychotic therapies at discharge:  No   Has Patient had three or more failed trials of antipsychotic monotherapy by history:  No  Recommended Plan for Multiple Antipsychotic Therapies: NA   Allergies as of 04/16/2017      Reactions   Doxycycline Other (See Comments)   Reaction:  Blisters    Lisinopril Swelling      Medication List    ASK your doctor about these medications     Indication  acetaminophen 325 MG tablet Commonly known as:  TYLENOL Take 650 mg by mouth every 6 (six) hours as needed for mild pain.    amLODipine 10 MG tablet Commonly known as:  NORVASC Take 10 mg by mouth daily.    aspirin EC 81 MG tablet Take  81 mg by mouth daily.    glipiZIDE 10 MG 24 hr tablet Commonly known as:  GLUCOTROL XL Take 10 mg by mouth daily.    HYDROcodone-acetaminophen 5-325 MG tablet Commonly known as:  NORCO/VICODIN Take 1 tablet by mouth every 6 (six) hours as needed for moderate pain.    indomethacin 25 MG capsule Commonly known as:  INDOCIN Take 50 mg by mouth 2 (two) times daily with a meal.    loratadine 10 MG tablet Commonly known as:  CLARITIN Take 10 mg by mouth daily.    metFORMIN 1000 MG tablet Commonly known as:  GLUCOPHAGE Take 1,000 mg by mouth daily.    nitroGLYCERIN 0.4 MG SL tablet Commonly known as:  NITROSTAT Place 0.4 mg under the tongue every 5 (five) minutes as needed for chest pain.    pantoprazole 40 MG tablet Commonly known as:  PROTONIX Take 1 tablet (40 mg total) by mouth daily.    saccharomyces boulardii 250 MG capsule Commonly known as:  FLORASTOR Take 1 capsule (250 mg total) by mouth 2 (two) times daily.    tamsulosin 0.4 MG Caps capsule Commonly known as:  FLOMAX Take 0.4 mg by mouth daily after breakfast.    traZODone 100 MG tablet Commonly known as:  DESYREL Take 100 mg by mouth at bedtime.       Follow-up Information    [facility] Follow up on 04/17/2017.   Why:  Patient tranferred to [facility] for medical treatment.   Contact information: [place] [address] [phone]          Follow-up recommendations:  Activity:  as per recommendations from IM service Diet:  carb modified Tests:  none Other:  none  Comments:  Pt transferred to [facility] telemetry.  SignedUrsula Alert, MD 04/17/2017, 8:39 PM

## 2017-04-17 NOTE — Progress Notes (Signed)
PROGRESS NOTE    Travis [name]  SHF:[id number] DOB: 03-28-1961 DOA: 04/16/2017 PCP: [employer]mond [employer], MD  Brief Narrative: Travis [name] is a 56 y.o. male with medical history significant for diabetes mellitus, hypertension, schizoaffective disorder, depression, history of right knee injury/surgery, admitted to behavioral health on 8/21 with depression, suicidal ideation and some questionable visual hallucinations. Patient had been waiting in the emergency room since 8/19 for behavioral health bed and finally admitted on 8/21. Psych M.D. did labs 8/22 morning which was notable for a creatinine of 3.4 and mild hypokalemia which is considerably abnormal from prior labs on 8/19 which noted a creatinine of 1.2 hence was transferred to [place]:   AKI on chronic kidney disease stage II -Baseline creatinine around 1.3-1.4, admitted with creatinine of 3.3 with mild hypokalemia, likley due to poor Po intake, NSAID use etc -improving, continue IVF, stopped NSAIDs and metformin -renal ultrasound pending and urinalysis negative  for proteinuria   DM2 -hold glipizide -SSI for now    Esophageal reflux -PPI    Schizoaffective disorder ([place]) with major depression and Suicidal ideation -resumed risperidone, prozac and trazodone per psych recs -1:1 sitter, asked Psych MD to eval so he can be transferred back when AKI better  DVT prophylaxis: lovenox Code Status: Full code Family Communication: none at bedside Disposition Plan: wife at bedside  Consultants:   Psych   Subjective: Feels ok, po intake poor, is depressed but denies suicidality at this time  Objective: Vitals:   04/16/17 1726 04/16/17 2056 04/17/17 0601 04/17/17 1346  BP: 126/86 121/81 119/78 115/84  Pulse: (!) 114 (!) 107 89 100  Resp: 18 18 16 18   Temp: 98 F (36.7 C) 97.9 F (36.6 C) 98.4 F (36.9 C) 97.9 F (36.6 C)  TempSrc: Oral Oral Oral Oral  SpO2: 99% 97% 96% 96%  Weight: 84 kg  (185 lb 3 oz)     Height: 5\' 6"  (1.676 m)       Intake/Output Summary (Last 24 hours) at 04/17/17 1353 Last data filed at 04/17/17 1348  Gross per 24 hour  Intake              770 ml  Output              250 ml  Net              520 ml   Filed Weights   04/16/17 1726  Weight: 84 kg (185 lb 3 oz)    Examination:  General exam: Appears calm and comfortable  Respiratory system: Clear to auscultation. Respiratory effort normal. Cardiovascular system: S1 & S2 heard, RRR. No JVD, murmurs, rubs, gallops or clicks. No pedal edema. Gastrointestinal system: Abdomen is nondistended, soft and nontender. No organomegaly or masses felt. Normal bowel sounds heard. Central nervous system: Alert and oriented. No focal neurological deficits. Extremities: Symmetric 5 x 5 power. Skin: No rashes, lesions or ulcers Psychiatry: Judgement and insight appear normal. Mood & affect appropriate.     Data Reviewed:   CBC:  Recent Labs Lab 04/13/17 2049 04/16/17 0636 04/16/17 1816 04/17/17 0420  WBC 16.8* 14.8* 15.6* 10.8*  NEUTROABS  --  8.7*  --   --   HGB 15.9 15.3 14.8 13.6  HCT 46.2 45.0 43.0 39.5  MCV 85.9 85.7 85.1 85.3  PLT 254 263 224 502   Basic Metabolic Panel:  Recent Labs Lab 04/13/17 2049 04/16/17 0636 04/16/17 1816 04/17/17 0420  NA 141 141  --  139  K 3.1* 3.3*  --  3.1*  CL 101 98*  --  100*  CO2 26 28  --  27  GLUCOSE 155* 109*  --  76  BUN 26* 54*  --  62*  CREATININE 1.27* 3.31* 3.33* 2.17*  CALCIUM 10.0 9.9  --  9.2   GFR: Estimated Creatinine Clearance: 38.2 mL/min (A) (by C-G formula based on SCr of 2.17 mg/dL (H)). Liver Function Tests:  Recent Labs Lab 04/13/17 2049 04/16/17 0636  AST 25 24  ALT 40 31  ALKPHOS 57 55  BILITOT 0.7 0.7  PROT 8.3* 7.6  ALBUMIN 4.6 4.3   No results for input(s): LIPASE, AMYLASE in the last 168 hours. No results for input(s): AMMONIA in the last 168 hours. Coagulation Profile: No results for input(s): INR, PROTIME  in the last 168 hours. Cardiac Enzymes: No results for input(s): CKTOTAL, CKMB, CKMBINDEX, TROPONINI in the last 168 hours. BNP (last 3 results) No results for input(s): PROBNP in the last 8760 hours. HbA1C:  Recent Labs  04/16/17 0636  HGBA1C 6.3*   CBG:  Recent Labs Lab 04/16/17 2258 04/16/17 2331 04/17/17 0002 04/17/17 0752 04/17/17 1158  GLUCAP 63* 91 109* 97 134*   Lipid Profile:  Recent Labs  04/16/17 0636  CHOL 152  HDL 23*  LDLCALC 87  TRIG 209*  CHOLHDL 6.6   Thyroid Function Tests:  Recent Labs  04/16/17 0636  TSH 1.766   Anemia Panel: No results for input(s): VITAMINB12, FOLATE, FERRITIN, TIBC, IRON, RETICCTPCT in the last 72 hours. Urine analysis:    Component Value Date/Time   COLORURINE AMBER (A) 04/15/2017 1348   APPEARANCEUR TURBID (A) 04/15/2017 1348   LABSPEC 1.020 04/15/2017 1348   PHURINE 5.0 04/15/2017 1348   GLUCOSEU NEGATIVE 04/15/2017 1348   HGBUR NEGATIVE 04/15/2017 1348   BILIRUBINUR NEGATIVE 04/15/2017 1348   KETONESUR 5 (A) 04/15/2017 1348   PROTEINUR NEGATIVE 04/15/2017 1348   NITRITE NEGATIVE 04/15/2017 1348   LEUKOCYTESUR TRACE (A) 04/15/2017 1348   Sepsis Labs: @LABRCNTIP (procalcitonin:4,lacticidven:4)  )No results found for this or any previous visit (from the past 240 hour(s)).       Radiology Studies: No results found.      Scheduled Meds: . amLODipine  10 mg Oral Daily  . aspirin EC  81 mg Oral Daily  . enoxaparin (LOVENOX) injection  30 mg Subcutaneous Q24H  . FLUoxetine  20 mg Oral Daily  . insulin aspart  0-9 Units Subcutaneous TID WC  . loratadine  10 mg Oral Daily  . pantoprazole  40 mg Oral Daily  . polyethylene glycol  17 g Oral Daily  . risperiDONE  0.5 mg Oral Daily  . risperiDONE  1 mg Oral QHS  . saccharomyces boulardii  250 mg Oral BID  . senna-docusate  1 tablet Oral BID  . tamsulosin  0.4 mg Oral QPC breakfast  . traZODone  50 mg Oral QHS   Continuous Infusions: . sodium  chloride 75 mL/hr at 04/17/17 0913     LOS: 1 day    Time spent: 12min    Domenic Polite, MD [facility] Pager [phone]  If 7PM-7AM, please contact night-coverage [email or web address] Password [email or web address] 04/17/2017, 1:53 PM

## 2017-04-17 NOTE — Progress Notes (Signed)
  [facility] Adult Case Management Discharge Plan :  Will you be returning to the same living situation after discharge:  No.transfer to medical floor At discharge, do you have transportation home?: Yes,   [facility] Do you have the ability to pay for your medications: Yes,  insured  Release of information consent forms completed and in the chart;  Patient's signature needed at discharge.  Patient to Follow up at: Follow-up Information    [facility] Follow up on 04/17/2017.   Why:  Patient tranferred to [facility] for medical treatment.   Contact information: [place] [phone]          Next level of care provider has access to [place] and Suicide Prevention discussed: No.  CSW was unable to contact family and patient was unable to participate  Have you used any form of tobacco in the last 30 days? (Cigarettes, Smokeless Tobacco, Cigars, and/or Pipes): Yes  Has patient been referred to the Quitline?: Patient refused referral  Patient has been referred for addiction treatment: [place], LCSW 04/17/2017, 5:31 PM

## 2017-04-18 ENCOUNTER — Encounter (HOSPITAL_COMMUNITY): Payer: Self-pay | Admitting: *Deleted

## 2017-04-18 ENCOUNTER — Inpatient Hospital Stay (HOSPITAL_COMMUNITY)
Admission: AD | Admit: 2017-04-18 | Discharge: 2017-04-22 | DRG: 885 | Disposition: A | Discharge status: Home / Self Care | Payer: 59 | Attending: Psychiatry | Admitting: Psychiatry

## 2017-04-18 DIAGNOSIS — Z888 Allergy status to other drugs, medicaments and biological substances status: Secondary | ICD-10-CM

## 2017-04-18 DIAGNOSIS — Z881 Allergy status to other antibiotic agents status: Secondary | ICD-10-CM

## 2017-04-18 DIAGNOSIS — F259 Schizoaffective disorder, unspecified: Secondary | ICD-10-CM | POA: Diagnosis present

## 2017-04-18 DIAGNOSIS — Z79891 Long term (current) use of opiate analgesic: Secondary | ICD-10-CM

## 2017-04-18 DIAGNOSIS — E119 Type 2 diabetes mellitus without complications: Secondary | ICD-10-CM | POA: Diagnosis present

## 2017-04-18 DIAGNOSIS — K219 Gastro-esophageal reflux disease without esophagitis: Secondary | ICD-10-CM | POA: Diagnosis present

## 2017-04-18 DIAGNOSIS — I87309 Chronic venous hypertension (idiopathic) without complications of unspecified lower extremity: Secondary | ICD-10-CM | POA: Diagnosis present

## 2017-04-18 DIAGNOSIS — Z8249 Family history of ischemic heart disease and other diseases of the circulatory system: Secondary | ICD-10-CM

## 2017-04-18 DIAGNOSIS — R1310 Dysphagia, unspecified: Secondary | ICD-10-CM | POA: Diagnosis present

## 2017-04-18 DIAGNOSIS — Z79899 Other long term (current) drug therapy: Secondary | ICD-10-CM

## 2017-04-18 DIAGNOSIS — G4700 Insomnia, unspecified: Secondary | ICD-10-CM | POA: Diagnosis present

## 2017-04-18 DIAGNOSIS — F333 Major depressive disorder, recurrent, severe with psychotic symptoms: Principal | ICD-10-CM | POA: Diagnosis present

## 2017-04-18 DIAGNOSIS — N179 Acute kidney failure, unspecified: Secondary | ICD-10-CM | POA: Diagnosis present

## 2017-04-18 DIAGNOSIS — Z720 Tobacco use: Secondary | ICD-10-CM

## 2017-04-18 DIAGNOSIS — R45851 Suicidal ideations: Secondary | ICD-10-CM | POA: Diagnosis present

## 2017-04-18 DIAGNOSIS — Z7984 Long term (current) use of oral hypoglycemic drugs: Secondary | ICD-10-CM

## 2017-04-18 DIAGNOSIS — E872 Acidosis: Secondary | ICD-10-CM | POA: Diagnosis present

## 2017-04-18 DIAGNOSIS — F419 Anxiety disorder, unspecified: Secondary | ICD-10-CM | POA: Diagnosis present

## 2017-04-18 DIAGNOSIS — E785 Hyperlipidemia, unspecified: Secondary | ICD-10-CM | POA: Diagnosis present

## 2017-04-18 DIAGNOSIS — Z560 Unemployment, unspecified: Secondary | ICD-10-CM

## 2017-04-18 DIAGNOSIS — I10 Essential (primary) hypertension: Secondary | ICD-10-CM | POA: Diagnosis present

## 2017-04-18 DIAGNOSIS — Z7982 Long term (current) use of aspirin: Secondary | ICD-10-CM

## 2017-04-18 DIAGNOSIS — Z833 Family history of diabetes mellitus: Secondary | ICD-10-CM

## 2017-04-18 LAB — BASIC METABOLIC PANEL
Anion gap: 9 (ref 5–15)
BUN: 33 mg/dL — ABNORMAL HIGH (ref 6–20)
CO2: 27 mmol/L (ref 22–32)
Calcium: 9.1 mg/dL (ref 8.9–10.3)
Chloride: 104 mmol/L (ref 101–111)
Creatinine, Ser: 1.29 mg/dL — ABNORMAL HIGH (ref 0.61–1.24)
GFR calc Af Amer: >60 mL/min (ref >60)
GFR calc non Af Amer: 60 mL/min — ABNORMAL LOW (ref >60)
Glucose, Bld: 94 mg/dL (ref 65–99)
Potassium: 3.3 mmol/L — ABNORMAL LOW (ref 3.5–5.1)
Sodium: 140 mmol/L (ref 135–145)

## 2017-04-18 LAB — GLUCOSE, CAPILLARY
Glucose-Capillary: 116 mg/dL — ABNORMAL HIGH (ref 65–99)
Glucose-Capillary: 205 mg/dL — ABNORMAL HIGH (ref 65–99)
Glucose-Capillary: 99 mg/dL (ref 65–99)

## 2017-04-18 MED ORDER — ASPIRIN EC 81 MG PO TBEC
81.0000 mg | DELAYED_RELEASE_TABLET | Freq: Every day | ORAL | Status: DC
  Administered 2017-04-19 – 2017-04-22 (×4): 81 mg via ORAL
  Filled 2017-04-18 (×7): qty 1

## 2017-04-18 MED ORDER — BISACODYL 10 MG RE SUPP: 10.0000 mg | Freq: Once | RECTAL | Status: DC

## 2017-04-18 MED ORDER — LORATADINE 10 MG PO TABS
10.0000 mg | ORAL_TABLET | Freq: Every day | ORAL | Status: DC
  Administered 2017-04-19 – 2017-04-22 (×4): 10 mg via ORAL
  Filled 2017-04-18 (×7): qty 1

## 2017-04-18 MED ORDER — RISPERIDONE 1 MG PO TABS
1.0000 mg | ORAL_TABLET | Freq: Every day | ORAL | Status: DC
  Administered 2017-04-18 – 2017-04-21 (×4): 1 mg via ORAL
  Filled 2017-04-18 (×7): qty 1

## 2017-04-18 MED ORDER — RISPERIDONE 0.5 MG PO TABS: 0.5000 mg | ORAL_TABLET | Freq: Every day | ORAL | Status: DC

## 2017-04-18 MED ORDER — TAMSULOSIN HCL 0.4 MG PO CAPS
0.4000 mg | ORAL_CAPSULE | Freq: Every day | ORAL | Status: DC
  Administered 2017-04-19 – 2017-04-22 (×4): 0.4 mg via ORAL
  Filled 2017-04-18 (×7): qty 1

## 2017-04-18 MED ORDER — ACETAMINOPHEN 325 MG PO TABS
650.0000 mg | ORAL_TABLET | Freq: Four times a day (QID) | ORAL | Status: DC | PRN
  Administered 2017-04-21 – 2017-04-22 (×2): 650 mg via ORAL
  Filled 2017-04-18 (×2): qty 2

## 2017-04-18 MED ORDER — CHLORHEXIDINE GLUCONATE 0.12 % MT SOLN
15.0000 mL | Freq: Two times a day (BID) | OROMUCOSAL | Status: DC
  Administered 2017-04-18 – 2017-04-22 (×8): 15 mL via OROMUCOSAL
  Filled 2017-04-18 (×12): qty 15

## 2017-04-18 MED ORDER — RISPERIDONE 0.5 MG PO TABS
0.5000 mg | ORAL_TABLET | Freq: Every day | ORAL | Status: DC
  Administered 2017-04-19 – 2017-04-22 (×4): 0.5 mg via ORAL
  Filled 2017-04-18 (×6): qty 1

## 2017-04-18 MED ORDER — METFORMIN HCL 500 MG PO TABS
1000.0000 mg | ORAL_TABLET | Freq: Every day | ORAL | Status: DC
  Administered 2017-04-19 – 2017-04-22 (×4): 1000 mg via ORAL
  Filled 2017-04-18 (×6): qty 2

## 2017-04-18 MED ORDER — RISPERIDONE 1 MG PO TABS: 1.0000 mg | ORAL_TABLET | Freq: Every day | ORAL | Status: DC

## 2017-04-18 MED ORDER — FLUOXETINE HCL 20 MG PO CAPS: 20.0000 mg | ORAL_CAPSULE | Freq: Every day | ORAL | 3 refills | Status: DC

## 2017-04-18 MED ORDER — PANTOPRAZOLE SODIUM 40 MG PO TBEC
40.0000 mg | DELAYED_RELEASE_TABLET | Freq: Every day | ORAL | Status: DC
  Administered 2017-04-19 – 2017-04-22 (×4): 40 mg via ORAL
  Filled 2017-04-18 (×8): qty 1

## 2017-04-18 MED ORDER — TRAZODONE HCL 100 MG PO TABS
100.0000 mg | ORAL_TABLET | Freq: Every day | ORAL | Status: DC
  Administered 2017-04-18 – 2017-04-21 (×4): 100 mg via ORAL
  Filled 2017-04-18 (×8): qty 1

## 2017-04-18 MED ORDER — POLYETHYLENE GLYCOL 3350 17 G PO PACK
17.0000 g | PACK | Freq: Two times a day (BID) | ORAL | Status: DC
  Administered 2017-04-19 – 2017-04-21 (×2): 17 g via ORAL
  Filled 2017-04-18 (×13): qty 1

## 2017-04-18 MED ORDER — ALUM & MAG HYDROXIDE-SIMETH 200-200-20 MG/5ML PO SUSP: 30.0000 mL | ORAL | Status: DC | PRN

## 2017-04-18 MED ORDER — HYDROXYZINE HCL 25 MG PO TABS: 25.0000 mg | ORAL_TABLET | Freq: Three times a day (TID) | ORAL | Status: DC | PRN

## 2017-04-18 MED ORDER — SENNOSIDES-DOCUSATE SODIUM 8.6-50 MG PO TABS: 1.0000 | ORAL_TABLET | Freq: Two times a day (BID) | ORAL | Status: DC

## 2017-04-18 MED ORDER — TRAMADOL HCL 50 MG PO TABS: 50.0000 mg | ORAL_TABLET | Freq: Four times a day (QID) | ORAL | Status: DC | PRN

## 2017-04-18 MED ORDER — MAGNESIUM HYDROXIDE 400 MG/5ML PO SUSP: 30.0000 mL | Freq: Every day | ORAL | Status: DC | PRN

## 2017-04-18 MED ORDER — NITROGLYCERIN 0.4 MG SL SUBL: 0.4000 mg | SUBLINGUAL_TABLET | SUBLINGUAL | Status: DC | PRN

## 2017-04-18 MED ORDER — TRAZODONE HCL 50 MG PO TABS: 50.0000 mg | ORAL_TABLET | Freq: Every evening | ORAL | Status: DC | PRN

## 2017-04-18 MED ORDER — GLIPIZIDE ER 10 MG PO TB24
10.0000 mg | ORAL_TABLET | Freq: Every day | ORAL | Status: DC
  Administered 2017-04-19 – 2017-04-22 (×4): 10 mg via ORAL
  Filled 2017-04-18 (×7): qty 1

## 2017-04-18 MED ORDER — ENOXAPARIN SODIUM 40 MG/0.4ML ~~LOC~~ SOLN: 40.0000 mg | SUBCUTANEOUS | Status: DC

## 2017-04-18 MED ORDER — FLUOXETINE HCL 20 MG PO CAPS
20.0000 mg | ORAL_CAPSULE | Freq: Every day | ORAL | Status: DC
  Administered 2017-04-19 – 2017-04-22 (×4): 20 mg via ORAL
  Filled 2017-04-18 (×6): qty 1

## 2017-04-18 MED ORDER — SACCHAROMYCES BOULARDII 250 MG PO CAPS
250.0000 mg | ORAL_CAPSULE | Freq: Two times a day (BID) | ORAL | Status: DC
  Administered 2017-04-18 – 2017-04-22 (×8): 250 mg via ORAL
  Filled 2017-04-18 (×13): qty 1

## 2017-04-18 MED ORDER — POLYETHYLENE GLYCOL 3350 17 G PO PACK: 17.0000 g | PACK | Freq: Two times a day (BID) | ORAL | 0 refills | Status: DC

## 2017-04-18 MED ORDER — AMLODIPINE BESYLATE 10 MG PO TABS
10.0000 mg | ORAL_TABLET | Freq: Every day | ORAL | Status: DC
  Administered 2017-04-19 – 2017-04-22 (×4): 10 mg via ORAL
  Filled 2017-04-18 (×7): qty 1

## 2017-04-18 MED ORDER — TRAMADOL HCL 50 MG PO TABS: 50.0000 mg | ORAL_TABLET | Freq: Four times a day (QID) | ORAL | 0 refills | Status: DC | PRN

## 2017-04-18 MED ORDER — SENNOSIDES-DOCUSATE SODIUM 8.6-50 MG PO TABS
1.0000 | ORAL_TABLET | Freq: Two times a day (BID) | ORAL | Status: DC
  Administered 2017-04-18 – 2017-04-22 (×6): 1 via ORAL
  Filled 2017-04-18 (×13): qty 1

## 2017-04-18 NOTE — Progress Notes (Signed)
Report received from admitting nurse. Patient denies SI/HI/AVH and pain at this time. Orders received and acknowledged.  Medications administered as per order. Patient verbally contracts for safety on the unit and agrees to come to staff before acting on any self harm thoughts/feelings and other needs or concerns. 15 min checks initiated for safety and patient oriented to the unit and the unit schedule. 

## 2017-04-18 NOTE — Progress Notes (Signed)
Patient was admitted to [facility] on 04/14/2017 and discharged to [facility] due to "a creatinine of 3.4 and mild hypokalemia which is considerably abnormal from prior labs on 8/19 which noted a creatinine of 1.2".  Patient is being readmitted to [facility].  Patient currently denies SI/HI/AVH. Contracts for safety. Patient verbalizes mild confusion.  Skin was assessed and found to be clear of any abnormal marks.  Patient searched and no contraband found, POC and unit policies explained and understanding verbalized. Consents obtained.          Admission Note from 04/14/2017:  56 year old male who presents IVC, in no acute distress, for the treatment of SI and Paranoia.  Patient reports SI with a plan to "drink bleach".  Patient displays paranoia on admission and states "People aren't going to shoot me in the head here are they?".  Patient made references, questioning his safety, multiple times during the admission.  Patient appears anxious and fearful on admission. Patient was cooperative with admission process. Patient presents with passive SI and contracts for safety upon admission. Patient reports auditory and visual hallucinations.  Patient verbalized fear that wife is going to leave him.  Patient reports that wife has made no indication of her leaving him or being upset with him about anything.  Patient believes that his wife is going to leave him and that he will be homeless therefore he is afraid to make contact with wife.  Patient identifies "financial issues" as an additional stressor.  Patient reports previous SI attempt "a couple of weeks ago" and states "I took a bunch of pills".  Patient currently lives with his wife and identifies his wife as his support system "if I even still have a wife".  While at [facility], patient would like to "be safe enough to get out and go places".  Skin was assessed and found to be clear of any abnormal marks.  Patient searched and no contraband found, POC and unit policies  explained and understanding verbalized. Consents obtained. Food and fluids offered, and fluids accepted. Patient had no additional questions or concerns.

## 2017-04-18 NOTE — Clinical Social Work Note (Signed)
Clinical Social Work Assessment  Patient Details  Name: Travis [name] MRN: [id number] Date of Birth: 10-21-1960  Date of referral:  04/18/17               Reason for consult:   (Return to [facility])                Permission sought to share information with:  Psychiatrist Permission granted to share information::     Name::        Agency::     Relationship::  Spouse   Contact Information:     Housing/Transportation Living arrangements for the past 2 months:  Single Family Home Source of Information:  Spouse Patient Interpreter Needed:  None Criminal Activity/Legal Involvement Pertinent to Current Situation/Hospitalization:  No - Comment as needed Significant Relationships:  Spouse Lives with:  Spouse Do you feel safe going back to the place where you live?  Yes Need for family participation in patient care:  Yes (Comment)  Care giving concerns:  Patient admitted from [facility].    Social Worker assessment / plan: CSW met with patient and spouse at bedside. Patient gave CSW permission to talk with spouse. Patient spouse reports the patient went to [facility] over the weekend after "things started going downhill."She reports patient had surgery a few weeks ago due to adomonial infection and since then his behaviors have changed, SI/ intent and plan to harm self, visual and auditory hallucinations. She is concern about patient food intake, hydration and BM. Nurse reports patient was given miralax for BM. CSW confirmed with [facility] patient will go to room 502 bed 1.   Plan: Inpatient psychiatric facility [facility]  Employment status:  Full-Time Insurance information:  Managed Medicare PT Recommendations:  Not assessed at this time Information / Referral to community resources:  Inpatient Psychiatric Care   Patient/Family's Response to care:  Patient under IVC to continue treatment  at [facility].   Patient/Family's Understanding of and Emotional Response to Diagnosis, Current Treatment, and Prognosis:   Patient spouse concern about patient behaviors and agreeable for him to return to [facility].   Emotional Assessment Appearance:  Developmentally appropriate Attitude/Demeanor/Rapport:    Affect (typically observed):  Calm, Accepting Orientation:  Oriented to Self, Oriented to Place, Oriented to Situation Alcohol / Substance use:  Not Applicable Psych involvement (Current and /or in the community):  Yes (Comment) (Admitted from [facility])  Discharge Needs  Concerns to be addressed:  Discharge Planning Concerns, Care Coordination Readmission within the last 30 days:  Yes Current discharge risk:  Psychiatric Illness Barriers to Discharge:  No Barriers Identified   Lia Hopping, LCSW 04/18/2017, 11:48 AM

## 2017-04-18 NOTE — Progress Notes (Signed)
  D: When asked about his day pt stated, "been ok, still a little stressed from life".Writer called pt's name in an attempt to verify. Pt stated in a flat mono tone voice and flat affect, "It's always been my name". Pt didn't go into any other details with the Probation officer. However, he denied SI, HI, and A/V. Pt has no questions or concerns.    A:  Support and encouragement was offered. 15 min checks continued for safety.  R: Pt remains safe.

## 2017-04-18 NOTE — Progress Notes (Signed)
Patient under IVC and will transport by [facility].  12:30pm [facility] called for transport.  IVC papers at nurses station w/ Retail banker.  Nurse please call report to: [place], Latanya Presser, MSW Clinical Social Worker 5E and [place] [phone] 04/18/2017  12:01 PM

## 2017-04-18 NOTE — BHH Suicide Risk Assessment (Signed)
Travis [name] Suicide Risk Assessment Date of Service: 04/15/2017 2:14 PM Travis [name], [place]  Psychiatry    [] Hide copied text [] Hover for attribution information [facility] INPATIENT:  Family/Significant Other Suicide Prevention Education  Suicide Prevention Education:  Education Completed;  wife, Travis [name], [phone] has been identified by the patient as the family member/significant other with whom the patient will be residing, and identified as the person(s) who will aid the patient in the event of a mental health crisis (suicidal ideations/suicide attempt).  With written consent from the patient, the family member/significant other has been provided the following suicide prevention education, prior to the and/or following the discharge of the patient.  The suicide prevention education provided includes the following:  Suicide risk factors  Suicide prevention and interventions  National Suicide Hotline telephone number  [facility] assessment telephone number  [facility] Emergency Assistance [place] and/or Residential Mobile Crisis Unit telephone number  Request made of family/significant other to:  Remove weapons (e.g., guns, rifles, knives), all items previously/currently identified as safety concern.    Remove drugs/medications (over-the-counter, prescriptions, illicit drugs), all items previously/currently identified as a safety concern.  The family member/significant other verbalizes understanding of the suicide prevention education information provided.  The family member/significant other agrees to remove the items of safety concern listed above. She states she was unconcerned about SI until she looked at his google history and discovered the search for lethality of drinking bleach.  Travis [name] also states she knew nothing about his mental health history prior to this hospitalization.  She is a Marine scientist, and is invested in making sure he goes to his  follow up appointments and takes his meds going forward.  Travis [name] 04/15/2017, 2:14 PM    Electronically signed by Travis Mage, LCSW at 04/16/2017 11:51 AM

## 2017-04-18 NOTE — BHH Counselor (Addendum)
Adult Comprehensive Assessment  Patient ID: Travis [name], male   DOB: [birth year], 56 y.o.   MRN: [id number]  Information Source: wife, Travis [name], [phone]  Current Stressors:  Employment / Job issues: employed by [employer] always worked Family Relationships: not a close family; wife is main Training and development officer / Lack of resources (include bankruptcy): no issues Physical health (include injuries & life threatening diseases): Kidney disease,  Substance abuse: N/A  Living/Environment/Situation:  Living Arrangements: Spouse/significant other Living conditions (as described by patient or guardian): good How long has patient lived in current situation?: 6 years What is atmosphere in 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, Supportive  Family History:  Marital status: Married Number of Years Married: 6 What types of issues is patient dealing with in the relationship?: "He's a quiet kind of person" Additional relationship information: Pt had never told her about previous hospitalizations Are you sexually active?: Yes What is your sexual orientation?: straight Does patient have children?: No  Childhood History:  By whom was/is the patient raised?: Both parents Patient's description of current relationship with people who raised him/her: both parents deceased-mother died of heart attack-father had dementia Does patient have siblings?: Yes Number of Siblings: 3 Description of patient's current relationship with siblings: 1 sister estranged  1 sister died  Did patient suffer any verbal/emotional/physical/sexual abuse as a child?: No Did patient suffer from severe childhood neglect?: No Was the patient ever a victim of a crime or a disaster?: No Witnessed domestic violence?: No Has patient been effected by domestic violence as an adult?: No  Education:  Highest grade of school patient has completed: 12th grade Currently a student?: No Name of school: NA Learning disability?:  No  Employment/Work Situation:   Employment situation: Employed Where is patient currently employed?: 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 How long has patient been employed?: 5 years Patient's job has been impacted by current illness: No What is the longest time patient has a held a job?: 30 years Where was the patient employed at that time?: factory Has patient ever been in the [employer]?: No Has patient ever served in combat?: No Did You Receive Any Psychiatric Treatment/Services While in Passenger transport manager?: No Are There Guns or Other Weapons in [place]?: Yes Types of Guns/Weapons: black powder rifle Are These Psychologist, educational?: Yes (wife confirms weapon is inaccessible)  Pensions consultant:   Museum/gallery curator resources: Multimedia programmer, Income from employment Does patient have a Programmer, applications or guardian?: No  Alcohol/Substance Abuse:   What has been your use of drugs/alcohol within the last 12 months?: tobacco If attempted suicide, did drugs/alcohol play a role in this?: No Has alcohol/substance abuse ever caused legal problems?: No  Social Support System:   Pensions consultant Support System: Good Describe Community Support System: Wife Type of faith/religion: Darrick Meigs How does patient's faith help to cope with current illness?: Pray  Leisure/Recreation:   Leisure and Hobbies: unknown  Strengths/Needs:   What things does the patient do well?: unknown In what areas does patient struggle / problems for patient: unknown  Discharge Plan:   Does patient have access to transportation?: Yes Will patient be returning to same living situation after discharge?: Yes Currently receiving community mental health services: No If no, would patient like referral for services when discharged?: Yes (What county?) Does patient have financial barriers related to discharge medications?: No  Summary/Recommendations:   Summary and Recommendations (to be completed by the  evaluator): Travis [name] is a 56 YO Caucasian male diagnosed  with Schizzoaffective D/O.  He presents under IVC with paranoia and disorganization.  At d/c, he plans to return home and follow up [facility]  In the meantime, he can benefit from crise stabilization, medication management, therapeutic milieu and referral for servies.  [name]. 04/18/2017

## 2017-04-18 NOTE — Tx Team (Signed)
Initial Treatment Plan 04/18/2017 4:47 PM Travis [name] FMM:[id number]    PATIENT STRESSORS: Other: Psychosis   PATIENT STRENGTHS: Communication skills Motivation for treatment/growth Supportive family/friends   PATIENT IDENTIFIED PROBLEMS: Psychosis  "Getting better"  "Talk about some things that are going on"                 DISCHARGE CRITERIA:  Ability to meet basic life and health needs Improved stabilization in mood, thinking, and/or behavior Motivation to continue treatment in a less acute level of care Need for constant or close observation no longer present Verbal commitment to aftercare and medication compliance  PRELIMINARY DISCHARGE PLAN: Outpatient therapy Return to previous living arrangement Return to previous work or school arrangements  PATIENT/FAMILY INVOLVEMENT: This treatment plan has been presented to and reviewed with the patient, Travis [name].  The patient and family have been given the opportunity to ask questions and make suggestions.  Dustin Flock, RN 04/18/2017, 4:47 PM

## 2017-04-18 NOTE — Discharge Summary (Addendum)
Physician Discharge Summary  Travis [name] BTD:[id number] DOB: 08-24-1961 DOA: 04/16/2017  PCP: Redmond School, MD  Admit date: 04/16/2017 Discharge date: 04/18/2017  Time spent: 35 minutes  Recommendations for Outpatient Follow-up:  1. PCP in 1 week 2. [facility]ng[facility]ing [facility] care  Discharge Diagnoses:  Principal Problem:   AKI (acute kidney injury) ([place])   Major depression   Suicidal ideation   Esophageal reflux   Type 2 diabetes mellitus ([place])   Schizoaffective disorder ([place])   Depression   Discharge Condition: stable  Diet recommendation: diabetic  Filed Weights   04/16/17 1726  Weight: 84 kg (185 lb 3 oz)    History of present illness:  Travis [name] a 56 y.o.malewith medical history significant for diabetes mellitus, hypertension, schizoaffective disorder, depression, history of right knee injury/surgery, admitted to behavioral health on 8/21 with depression, suicidal ideation and some questionable visual hallucinations. Patient had been waiting in the emergency room since 8/19 for behavioral health bed and finally admitted on 8/21. Psych M.D. did labs 8/22 morning which was notable for a creatinine of 3.4 and mild hypokalemia which is considerably abnormal from prior labs on 8/19 which noted a creatinine of 1.2 hence was transferred to [facility] Course:   AKI on chronic kidney disease stage II -Baseline creatinine around 1.3-1.4, admitted with creatinine of 3.3 with mild hypokalemia, likley due to poor Po intake, ongoing NSAID use etc -improved with hydration, stopping NSAIDs -renal ultrasound without hydronephrosis noted and urinalysis negative  for proteinuria -improved and stable, discharged back to [facility] for ongoing care of Depression/schizoaffective d/o  DM2 -resumed glipizide and metformin, kidney function improved to range where he can tolerate metformin  Esophageal reflux -continue PPI  Schizoaffective disorder ([place])  with major depression and Suicidal ideation -resumed risperidone, prozac and trazodone which were started at [facility] prior to admission here -1:1 sitter, s/p Psych MD evaluation and discharge back to [facility] today for ongoing psychiatric care   Constipation -due to narcotics and dehydration -added miralax/senokot, continue this at [facility]   Consultations:  Psychiatry  Discharge Exam: Vitals:   04/17/17 2122 04/18/17 0522  BP: 114/74 108/77  Pulse: 89 81  Resp: 16 14  Temp: 98 F (36.7 C) 98 F (36.7 C)  SpO2: 97% 97%    General: AAOx3 Cardiovascular: S1S2/RRR Respiratory: CTAB  Discharge Instructions   Discharge Instructions    Diet - low sodium heart healthy    Complete by:  As directed    Increase activity slowly    Complete by:  As directed      Current Discharge Medication List    START taking these medications   Details  FLUoxetine (PROZAC) 20 MG capsule Take 1 capsule (20 mg total) by mouth daily. Refills: 3    polyethylene glycol (MIRALAX / GLYCOLAX) packet Take 17 g by mouth 2 (two) times daily. Qty: 14 each, Refills: 0    !! risperiDONE (RISPERDAL) 0.5 MG tablet Take 1 tablet (0.5 mg total) by mouth daily.    !! risperiDONE (RISPERDAL) 1 MG tablet Take 1 tablet (1 mg total) by mouth at bedtime.    senna-docusate (SENOKOT-S) 8.6-50 MG tablet Take 1 tablet by mouth 2 (two) times daily.    traMADol (ULTRAM) 50 MG tablet Take 1 tablet (50 mg total) by mouth every 6 (six) hours as needed for moderate pain. Qty: 30 tablet, Refills: 0     !! - Potential duplicate medications found. Please discuss with provider.    CONTINUE these  medications which have NOT CHANGED   Details  acetaminophen (TYLENOL) 325 MG tablet Take 650 mg by mouth every 6 (six) hours as needed for mild pain.    amLODipine (NORVASC) 10 MG tablet Take 10 mg by mouth daily.    aspirin EC 81 MG tablet Take 81 mg by mouth daily.    chlorhexidine (PERIDEX) 0.12 % solution Use as directed 15 mLs in  the mouth or throat 2 (two) times daily.    glipiZIDE (GLUCOTROL XL) 10 MG 24 hr tablet Take 10 mg by mouth daily.     HYDROcodone-acetaminophen (NORCO/VICODIN) 5-325 MG tablet Take 1 tablet by mouth every 6 (six) hours as needed for moderate pain.     loratadine (CLARITIN) 10 MG tablet Take 10 mg by mouth daily.    metFORMIN (GLUCOPHAGE) 1000 MG tablet Take 1,000 mg by mouth daily.     nitroGLYCERIN (NITROSTAT) 0.4 MG SL tablet Place 0.4 mg under the tongue every 5 (five) minutes as needed for chest pain.    pantoprazole (PROTONIX) 40 MG tablet Take 1 tablet (40 mg total) by mouth daily. Qty: 30 tablet, Refills: 0    saccharomyces boulardii (FLORASTOR) 250 MG capsule Take 1 capsule (250 mg total) by mouth 2 (two) times daily. Qty: 60 capsule, Refills: 0    tamsulosin (FLOMAX) 0.4 MG CAPS capsule Take 0.4 mg by mouth daily after breakfast.    traZODone (DESYREL) 100 MG tablet Take 100 mg by mouth at bedtime.       STOP taking these medications     indomethacin (INDOCIN) 25 MG capsule        Allergies  Allergen Reactions  . Doxycycline Other (See Comments)    Reaction:  Blisters   . Lisinopril Swelling   Follow-up Information    Redmond School, MD. Schedule an appointment as soon as possible for a visit in 1 week(s).   Specialty:  Internal Medicine Contact information: [address] [place] [address] [phone]            The results of significant diagnostics from this hospitalization (including imaging, microbiology, ancillary and laboratory) are listed below for reference.    Significant Diagnostic Studies: US Renal  Result Date: 04/17/2017 CLINICAL DATA:  Acute renal insufficiency. EXAM: RENAL / URINARY TRACT ULTRASOUND COMPLETE COMPARISON:  CT scan March 13, 2017 FINDINGS: Right Kidney: Length: 11.4 cm.  Contains a 1.6 cm cyst. Left Kidney: Length: 11.5 cm. The left renal pelvis is mildly prominent with no caliectasis. No hydronephrosis. Bladder:  Appears normal for degree of bladder distention. IMPRESSION: No cause for acute renal insufficiency identified. Electronically Signed   By: Dorise Bullion III M.D   On: 04/17/2017 17:35    Microbiology: No results found for this or any previous visit (from the past 240 hour(s)).   Labs: Basic Metabolic Panel:  Recent Labs Lab 04/13/17 2049 04/16/17 0636 04/16/17 1816 04/17/17 0420 04/18/17 0401  NA 141 141  --  139 140  K 3.1* 3.3*  --  3.1* 3.3*  CL 101 98*  --  100* 104  CO2 26 28  --  27 27  GLUCOSE 155* 109*  --  76 94  BUN 26* 54*  --  62* 33*  CREATININE 1.27* 3.31* 3.33* 2.17* 1.29*  CALCIUM 10.0 9.9  --  9.2 9.1   Liver Function Tests:  Recent Labs Lab 04/13/17 2049 04/16/17 0636  AST 25 24  ALT 40 31  ALKPHOS 57 55  BILITOT 0.7 0.7  PROT  8.3* 7.6  ALBUMIN 4.6 4.3   No results for input(s): LIPASE, AMYLASE in the last 168 hours. No results for input(s): AMMONIA in the last 168 hours. CBC:  Recent Labs Lab 04/13/17 2049 04/16/17 0636 04/16/17 1816 04/17/17 0420  WBC 16.8* 14.8* 15.6* 10.8*  NEUTROABS  --  8.7*  --   --   HGB 15.9 15.3 14.8 13.6  HCT 46.2 45.0 43.0 39.5  MCV 85.9 85.7 85.1 85.3  PLT 254 263 224 215   Cardiac Enzymes: No results for input(s): CKTOTAL, CKMB, CKMBINDEX, TROPONINI in the last 168 hours. BNP: BNP (last 3 results) No results for input(s): BNP in the last 8760 hours.  ProBNP (last 3 results) No results for input(s): PROBNP in the last 8760 hours.  CBG:  Recent Labs Lab 04/17/17 1158 04/17/17 1638 04/17/17 2120 04/18/17 0741 04/18/17 1159  GLUCAP 134* 89 109* 116* 205*       SignedDomenic Polite MD.  [facility] 04/18/2017, 12:48 PM

## 2017-04-19 ENCOUNTER — Encounter (HOSPITAL_COMMUNITY): Payer: Self-pay | Admitting: Psychiatry

## 2017-04-19 DIAGNOSIS — E872 Acidosis: Secondary | ICD-10-CM

## 2017-04-19 DIAGNOSIS — N179 Acute kidney failure, unspecified: Secondary | ICD-10-CM

## 2017-04-19 DIAGNOSIS — F419 Anxiety disorder, unspecified: Secondary | ICD-10-CM

## 2017-04-19 DIAGNOSIS — F333 Major depressive disorder, recurrent, severe with psychotic symptoms: Secondary | ICD-10-CM | POA: Diagnosis present

## 2017-04-19 DIAGNOSIS — G4700 Insomnia, unspecified: Secondary | ICD-10-CM

## 2017-04-19 DIAGNOSIS — R200 Anesthesia of skin: Secondary | ICD-10-CM

## 2017-04-19 DIAGNOSIS — R45851 Suicidal ideations: Secondary | ICD-10-CM

## 2017-04-19 DIAGNOSIS — R443 Hallucinations, unspecified: Secondary | ICD-10-CM

## 2017-04-19 LAB — GLUCOSE, CAPILLARY
Glucose-Capillary: 106 mg/dL — ABNORMAL HIGH (ref 65–99)
Glucose-Capillary: 125 mg/dL — ABNORMAL HIGH (ref 65–99)

## 2017-04-19 MED ORDER — POTASSIUM CHLORIDE CRYS ER 20 MEQ PO TBCR
20.0000 meq | EXTENDED_RELEASE_TABLET | Freq: Two times a day (BID) | ORAL | Status: AC
  Administered 2017-04-19 – 2017-04-21 (×5): 20 meq via ORAL
  Filled 2017-04-19 (×5): qty 1

## 2017-04-19 NOTE — Progress Notes (Signed)
Data. Patient denies SI/HI/AVH. Verbally contracts for safety on the unit and to come to staff before acting of any self harm thoughts/feelings/voices. Patient interacting minimally with staff and other patients. Patient does not initiate interactions. Affect is flat. [place] Dandy is very minimal and concrete. Affect is blunt and does not brighten. Action. Emotional support and encouragement offered. Education provided on medication, indications and side effect. Q 15 minute checks done for safety. Response. Safety on the unit maintained through 15 minute checks.  Medications taken as prescribed. Remained calm and appropriate through out shift.

## 2017-04-19 NOTE — Progress Notes (Signed)
D: Pt passive AVH, denies SI/ HI. Pt is pleasant and cooperative. Pt stated he felt better due to the voices not being as bad. Pt continues to isolate to self , but will engage when talking to staff.   A: Pt was offered support and encouragement. Pt was given scheduled medications. Pt was encourage to attend groups. Q 15 minute checks were done for safety.   R:Pt attends groups and interacts well with peers and staff. Pt is taking medication. Pt has no complaints at this time .Pt receptive to treatment and safety maintained on unit.

## 2017-04-19 NOTE — Plan of Care (Signed)
Problem: Safety: Goal: Periods of time without injury will increase Outcome: Progressing Pt safe on the unit at this time   

## 2017-04-19 NOTE — H&P (Signed)
Psychiatric Admission Assessment Adult  Patient Identification: Travis [name] MRN:  [id number] Date of Evaluation:  04/19/2017 Principal Diagnosis: Major depressive disorder, recurrent episode, severe, with psychosis ([place]) Diagnosis:   Patient Active Problem List   Diagnosis Date Noted  . Major depressive disorder, recurrent episode, severe, with psychosis ([place]) [F33.3] 04/19/2017  . Depression [F32.9] 04/16/2017  . AKI (acute kidney injury) ([place]) [N17.9]   . Lactic acid acidosis [E87.2]   . Left sided numbness [R20.0]   . Sepsis ([place]) [A41.9]   . Elevated lactic acid level [R79.89]   . Leukocytosis [D72.829]   . Volume depletion [E86.9]   . Hypotension [I95.9] 03/13/2017  . Chest pain with moderate risk for cardiac etiology [R07.9] 01/13/2014  . Dyspnea [R06.00] 01/13/2014  . Family history of coronary artery disease- (mother had MI 12) [Z82.49] 01/13/2014  . Tobacco chew use [Z72.0]   . Type 2 diabetes mellitus ([place]) [E11.9]   . Dyslipidemia [E78.5]   . Unspecified vitamin D deficiency [E55.9]   . 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 Esophageal reflux [K21.9] 09/28/2007  . DYSPHAGIA UNSPECIFIED [R13.10] 09/28/2007  . BLOOD IN STOOL, OCCULT [R19.5] 09/28/2007  . HERNIORRHAPHY, HX OF [Z98.89] 09/28/2007   History of Present Illness: Per Consult Note- EUSTACE HUR a 56 y.o.male, seen, chart reviewed for this face-to-face psychiatric consultation and evaluation of increased symptoms of depression with suicidal ideation and hallucinations. Patient was initially admitted to the behavioral health hospitalization on 04/15/2017 and then found having elevated creatinine and hypokalemia required medical attention so he was discharged to [facility] for further treatment needs. Patient has been rehydrated with IV fluids and increased oral fluids at this time. Patient continued to endorse symptoms of depression and suicidal ideation and meet  criteria for inpatient psychiatric hospitalization when medically stable. Patient denied symptoms of mania, posttraumatic stress disorder and paranoid schizophrenia.   On Evaluation: Travis [name] is awake, alert and oriented. Present with flat, guarded and paranoia. Reports seeing things and people. Reports he is suicidal at times, however has never attempted. Denies  homicidal ideation. Patient report auditory hallucination  and states " I thnik its time for me to get help." Reports he was encouraged to seek treatment by his wife.  Support, encouragement and reassurance was provided.   Associated Signs/Symptoms: Depression Symptoms:  depressed mood, difficulty concentrating, hopelessness, suicidal thoughts with specific plan, anxiety, (Hypo) Manic Symptoms:  Delusions, Hallucinations, Impulsivity, Anxiety Symptoms:  Excessive Worry, Psychotic Symptoms:  Delusions, Hallucinations: Auditory Visual Ideas of Reference, Paranoia, PTSD Symptoms: Negative Total Time spent with patient: 45 minutes  Past Psychiatric History: PLS see above HP section Is the patient at risk to self? Yes.    Has the patient been a risk to self in the past 6 months? Yes.    Has the patient been a risk to self within the distant past? Yes.    Is the patient a risk to others? Yes.    Has the patient been a risk to others in the past 6 months? No.  Has the patient been a risk to others within the distant past? No.   Prior Inpatient Therapy:   Prior Outpatient Therapy:    Alcohol Screening: 1. How often do you have a drink containing alcohol?: Never 9. Have you or someone else been injured as a result of your drinking?: No 10. Has a relative or friend or a doctor or another health worker been concerned about your drinking or suggested you  cut down?: No Alcohol Use Disorder Identification Test Final Score (AUDIT): 0 Brief Intervention: AUDIT score less than 7 or less-screening does not suggest unhealthy  drinking-brief intervention not indicated Substance Abuse History in the last 12 months:  No.pt denies , but UDS - positive for BZD Consequences of Substance Abuse: Negative Previous Psychotropic Medications: Yes - unable to give details Psychological Evaluations: Yes  Past Medical History:  Past Medical History:  Diagnosis Date  . Dyslipidemia   . Fatigue    NAUSEA,BLOATING  . H. pylori infection 09/15/2006  . Hematochezia 08/30/2007  . History of nuclear stress test 04/03/2009   exercise myoview; normal pattern of perfusion; low risk scan   . Hypertension    treated  . NIDDM (non-insulin dependent diabetes mellitus) 2011   type 2  . Schizoaffective disorder ([place])   . Tobacco chew use   . Unspecified vitamin D deficiency     Past Surgical History:  Procedure Laterality Date  . APPENDECTOMY    . HERNIA REPAIR     RIGHT  . KNEE SURGERY     BOTH  . TRANSTHORACIC ECHOCARDIOGRAM  04/03/2009   EF=>55%; trace TR, mild pulm regurg   Family History:  Family History  Problem Relation Age of Onset  . Stroke Father 65  . Pneumonia Father   . Heart failure Mother 44  . Coronary artery disease Mother        also MI  . Heart attack Mother   . Heart attack Maternal Uncle        died in 43s  . Cancer Unknown        x2; maternal side  . Diabetes Maternal Grandmother    Family Psychiatric  History: unable to give details , denies  Tobacco Screening: Have you used any form of tobacco in the last 30 days? (Cigarettes, Smokeless Tobacco, Cigars, and/or Pipes): Yes Tobacco use, Select all that apply: smokeless tobacco use daily Are you interested in Tobacco Cessation Medications?: Yes, will notify MD for an order Counseled patient on smoking cessation including recognizing danger situations, developing coping skills and basic information about quitting provided: Refused/Declined practical counseling Social History: married , lives with wife in [place] , used to work for [employer]  . History  Alcohol Use No     History  Drug Use No    Additional Social History: Marital status: Married Number of Years Married: 6 What types of issues is patient dealing with in the relationship?: "He's a quiet kind of person" Additional relationship information: Pt had never told her about previous hospitalizations Are you sexually active?: Yes What is your sexual orientation?: straight Does patient have children?: No                         Allergies:   Allergies  Allergen Reactions  . Doxycycline Other (See Comments)    Reaction:  Blisters   . Lisinopril Swelling   Lab Results:  Results for orders placed or performed during the hospital encounter of 04/18/17 (from the past 48 hour(s))  Glucose, capillary     Status: None   Collection Time: 04/18/17  4:40 PM  Result Value Ref Range   Glucose-Capillary 99 65 - 99 mg/dL  Glucose, capillary     Status: Abnormal   Collection Time: 04/19/17  5:47 AM  Result Value Ref Range   Glucose-Capillary 106 (H) 65 - 99 mg/dL    Blood Alcohol level:  Lab Results  Component  Value Date   ETH <5 18/84/1660    Metabolic Disorder Labs:  Lab Results  Component Value Date   HGBA1C 6.3 (H) 04/16/2017   MPG 134.11 04/16/2017   No results found for: PROLACTIN Lab Results  Component Value Date   CHOL 152 04/16/2017   TRIG 209 (H) 04/16/2017   HDL 23 (L) 04/16/2017   CHOLHDL 6.6 04/16/2017   VLDL 42 (H) 04/16/2017   LDLCALC 87 04/16/2017    Current Medications: Current Facility-Administered Medications  Medication Dose Route Frequency Provider Last Rate Last Dose  . acetaminophen (TYLENOL) tablet 650 mg  650 mg Oral Q6H PRN Okonkwo, Justina A, NP      . alum & mag hydroxide-simeth (MAALOX/MYLANTA) 200-200-20 MG/5ML suspension 30 mL  30 mL Oral Q4H PRN Okonkwo, Justina A, NP      . amLODipine (NORVASC) tablet 10 mg  10 mg Oral Daily Okonkwo, Justina A, NP   10 mg at 04/19/17 0759  . aspirin EC tablet 81 mg  81 mg Oral  Daily Okonkwo, Justina A, NP   81 mg at 04/19/17 0800  . chlorhexidine (PERIDEX) 0.12 % solution 15 mL  15 mL Mouth/Throat BID Okonkwo, Justina A, NP   15 mL at 04/19/17 0758  . FLUoxetine (PROZAC) capsule 20 mg  20 mg Oral Daily Okonkwo, Justina A, NP   20 mg at 04/19/17 0759  . glipiZIDE (GLUCOTROL XL) 24 hr tablet 10 mg  10 mg Oral Daily Okonkwo, Justina A, NP   10 mg at 04/19/17 0759  . hydrOXYzine (ATARAX/VISTARIL) tablet 25 mg  25 mg Oral TID PRN Lu Duffel, Justina A, NP      . loratadine (CLARITIN) tablet 10 mg  10 mg Oral Daily Okonkwo, Justina A, NP   10 mg at 04/19/17 0801  . magnesium hydroxide (MILK OF MAGNESIA) suspension 30 mL  30 mL Oral Daily PRN Okonkwo, Justina A, NP      . metFORMIN (GLUCOPHAGE) tablet 1,000 mg  1,000 mg Oral Q breakfast Okonkwo, Justina A, NP   1,000 mg at 04/19/17 0800  . nitroGLYCERIN (NITROSTAT) SL tablet 0.4 mg  0.4 mg Sublingual Q5 min PRN Okonkwo, Justina A, NP      . pantoprazole (PROTONIX) EC tablet 40 mg  40 mg Oral Daily Okonkwo, Justina A, NP   40 mg at 04/19/17 0805  . polyethylene glycol (MIRALAX / GLYCOLAX) packet 17 g  17 g Oral BID Okonkwo, Justina A, NP   17 g at 04/19/17 0805  . risperiDONE (RISPERDAL) tablet 0.5 mg  0.5 mg Oral Daily Okonkwo, Justina A, NP   0.5 mg at 04/19/17 0801  . risperiDONE (RISPERDAL) tablet 1 mg  1 mg Oral QHS Okonkwo, Justina A, NP   1 mg at 04/18/17 2109  . saccharomyces boulardii (FLORASTOR) capsule 250 mg  250 mg Oral BID Okonkwo, Justina A, NP   250 mg at 04/19/17 0759  . senna-docusate (Senokot-S) tablet 1 tablet  1 tablet Oral BID Lu Duffel, Justina A, NP   1 tablet at 04/19/17 0802  . tamsulosin (FLOMAX) capsule 0.4 mg  0.4 mg Oral QPC breakfast Okonkwo, Justina A, NP      . traMADol (ULTRAM) tablet 50 mg  50 mg Oral Q6H PRN Okonkwo, Justina A, NP      . traZODone (DESYREL) tablet 100 mg  100 mg Oral QHS Okonkwo, Justina A, NP   100 mg at 04/18/17 2109   PTA Medications: Prescriptions Prior to Admission   Medication Sig Dispense Refill  Last Dose  . acetaminophen (TYLENOL) 325 MG tablet Take 650 mg by mouth every 6 (six) hours as needed for mild pain.   Past Week at Unknown time  . amLODipine (NORVASC) 10 MG tablet Take 10 mg by mouth daily.   Past Week at Unknown time  . aspirin EC 81 MG tablet Take 81 mg by mouth daily.   Past Week at Unknown time  . chlorhexidine (PERIDEX) 0.12 % solution Use as directed 15 mLs in the mouth or throat 2 (two) times daily.   04/13/2017  . FLUoxetine (PROZAC) 20 MG capsule Take 1 capsule (20 mg total) by mouth daily.  3   . glipiZIDE (GLUCOTROL XL) 10 MG 24 hr tablet Take 10 mg by mouth daily.    Past Week at Unknown time  . HYDROcodone-acetaminophen (NORCO/VICODIN) 5-325 MG tablet Take 1 tablet by mouth every 6 (six) hours as needed for moderate pain.    Past Week at Unknown time  . indomethacin (INDOCIN) 25 MG capsule      . loratadine (CLARITIN) 10 MG tablet Take 10 mg by mouth daily.   Past Week at Unknown time  . metFORMIN (GLUCOPHAGE) 1000 MG tablet Take 1,000 mg by mouth daily.    Past Week at Unknown time  . nitroGLYCERIN (NITROSTAT) 0.4 MG SL tablet Place 0.4 mg under the tongue every 5 (five) minutes as needed for chest pain.   unknown  . pantoprazole (PROTONIX) 40 MG tablet Take 1 tablet (40 mg total) by mouth daily. 30 tablet 0 Past Week at Unknown time  . polyethylene glycol (MIRALAX / GLYCOLAX) packet Take 17 g by mouth 2 (two) times daily. 14 each 0   . risperiDONE (RISPERDAL) 0.5 MG tablet Take 1 tablet (0.5 mg total) by mouth daily.     . risperiDONE (RISPERDAL) 1 MG tablet Take 1 tablet (1 mg total) by mouth at bedtime.     . saccharomyces boulardii (FLORASTOR) 250 MG capsule Take 1 capsule (250 mg total) by mouth 2 (two) times daily. 60 capsule 0 Past Week at Unknown time  . senna-docusate (SENOKOT-S) 8.6-50 MG tablet Take 1 tablet by mouth 2 (two) times daily.     . tamsulosin (FLOMAX) 0.4 MG CAPS capsule Take 0.4 mg by mouth daily after  breakfast.   Past Week at Unknown time  . traMADol (ULTRAM) 50 MG tablet Take 1 tablet (50 mg total) by mouth every 6 (six) hours as needed for moderate pain. 30 tablet 0   . traZODone (DESYREL) 100 MG tablet Take 100 mg by mouth at bedtime.    Past Week at Unknown time    Musculoskeletal: Strength & Muscle Tone: within normal limits Gait & Station: normal Patient leans: N/A  Psychiatric Specialty Exam: Physical Exam  Vitals reviewed. Cardiovascular: Normal rate.   Neurological: He is alert.  Psychiatric: He has a normal mood and affect. His behavior is normal.    Review of Systems  Psychiatric/Behavioral: Positive for depression and hallucinations. The patient is nervous/anxious.   All other systems reviewed and are negative.   Blood pressure 122/84, pulse (!) 107, temperature 98.3 F (36.8 C), temperature source Oral, resp. rate 18, height 5' 3.78" (1.62 m), weight 76.7 kg (169 lb).Body mass index is 29.21 kg/m.  General Appearance: Casual  Eye Contact:  Fair blank staring at times  Speech:  Normal Rate  Volume:  Decreased  Mood:  Anxious, Depressed and Dysphoric  Affect:  Congruent  Thought Process:  Irrelevant and Descriptions of Associations:  Circumstantial  Orientation:  Full (Time, Place, and Person)  Thought Content:  Hallucinations: Auditory Visual, Paranoid Ideation and Rumination  Suicidal Thoughts:  Yes.  with intent/plan  Homicidal Thoughts:  No  Memory:  Immediate;   Fair Recent;   Fair Remote;   Poor  Judgement:  Impaired  Insight:  Fair  Psychomotor Activity:  Normal  Concentration:  Concentration: Fair and Attention Span: Poor  Recall:  [employer] of Knowledge:  Fair  Language:  Fair  Akathisia:  No  Handed:  Right  AIMS (if indicated):     Assets:  Desire for Improvement  ADL's:  Intact  Cognition:  WNL  Sleep:  Number of Hours: 6.75     I agree with current treatment plan on 04/19/2017, Patient seen face-to-face for psychiatric evaluation  follow-up, chart reviewed and case discussed with the MD Eappen. Reviewed the information documented and agree with the treatment plan.   Treatment Plan Summary: Daily contact with patient to assess and evaluate symptoms and progress in treatment, Medication management and Plan see below   Continue with Prozac 20mg  and Risperdal 1mg  at QHS and 0.5mg  Dailyfor mood stabilization. Continue with Trazodone 100 mg for insomnia  Will continue to monitor vitals ,medication compliance and treatment side effects while patient is here.  Reviewed labs,-Potassium 3.3  Elevated creatinine, BUN an glucose.BAL - 198, UDS - pos for cocaine, thc and benzodizpines. CSW will start working on disposition.  Patient to participate in therapeutic milieu -See Internal Med consult notes  Observation Level/Precautions:  15 minute checks    Psychotherapy:  Individual and group therapy   Labs: see Chart  Consultations:  CSW  Discharge Concerns:  Safety, stabilization, and risk of access to medication and medication stabilization   ELS- 5-7days     Physician Treatment Plan for Primary Diagnosis: Major depressive disorder, recurrent episode, severe, with psychosis ([place]) Long Term Goal(s): Improvement in symptoms so as ready for discharge  Short Term Goals: Ability to verbalize feelings will improve, Ability to disclose and discuss suicidal ideas and Compliance with prescribed medications will improve  Physician Treatment Plan for Secondary Diagnosis: Principal Problem:   Major depressive disorder, recurrent episode, severe, with psychosis ([place])  Long Term Goal(s): Improvement in symptoms so as ready for discharge  Short Term Goals: Ability to verbalize feelings will improve, Ability to disclose and discuss suicidal ideas and Compliance with prescribed medications will improve  I certify that inpatient services furnished can reasonably be expected to improve the patient's condition.    Derrill Center,  NP 8/25/201812:50 PM

## 2017-04-19 NOTE — BHH Group Notes (Signed)
  B[facility]/BMU LCSW Group Therapy Note  Date/Time:  04/19/2017 11:15AM-12:00PM  Type of Therapy and Topic:  Group Therapy:  Feelings About Hospitalization  Participation Level:  Active   Description of Group This process group involved patients discussing their feelings related to being hospitalized, as well as the benefits they see to being in the hospital.  These feelings and benefits were itemized.  The group then brainstormed specific ways in which they could seek those same benefits when they discharge and return home.  Therapeutic Goals 1. Patient will identify and describe positive and negative feelings related to hospitalization 2. Patient will verbalize benefits of hospitalization to themselves personally 3. Patients will brainstorm together ways they can obtain similar benefits in the outpatient setting, identify barriers to wellness and possible solutions  Summary of Patient Progress:  The patient expressed his primary feelings about being hospitalized are "I feel good  I look forward to going home."  He did not talk any more, had a blunted and confused look on his face throughout the remainder of group.  Therapeutic Modalities Cognitive Behavioral Therapy Motivational Interviewing    Selmer Dominion, LCSW 04/19/2017, 1:54 PM

## 2017-04-19 NOTE — BHH Suicide Risk Assessment (Signed)
[facility] Admission Suicide Risk Assessment   Nursing information obtained from:  Patient Demographic factors:  Male, Divorced or widowed, Caucasian Current Mental Status:  Suicidal ideation indicated by patient, Suicide plan, Self-harm thoughts, Self-harm behaviors Loss Factors:  Loss of significant relationship, Financial problems / change in socioeconomic status Historical Factors:  Prior suicide attempts, Family history of mental illness or substance abuse Risk Reduction Factors:  Living with another person, especially a relative, Positive social support  Total Time spent with patient: 30 minutes Principal Problem: Major depressive disorder, recurrent episode, severe, with psychosis ([place]) Diagnosis:   Patient Active Problem List   Diagnosis Date Noted  . Major depressive disorder, recurrent episode, severe, with psychosis ([place]) [F33.3] 04/19/2017  . Depression [F32.9] 04/16/2017  . AKI (acute kidney injury) ([place]) [N17.9]   . Lactic acid acidosis [E87.2]   . Left sided numbness [R20.0]   . Sepsis ([place]) [A41.9]   . Elevated lactic acid level [R79.89]   . Leukocytosis [D72.829]   . Volume depletion [E86.9]   . Hypotension [I95.9] 03/13/2017  . Chest pain with moderate risk for cardiac etiology [R07.9] 01/13/2014  . Dyspnea [R06.00] 01/13/2014  . Family history of coronary artery disease- (mother had MI 51) [Z82.49] 01/13/2014  . Tobacco chew use [Z72.0]   . Type 2 diabetes mellitus ([place]) [E11.9]   . Dyslipidemia [E78.5]   . Unspecified vitamin D deficiency [E55.9]   . 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 Esophageal reflux [K21.9] 09/28/2007  . DYSPHAGIA UNSPECIFIED [R13.10] 09/28/2007  . BLOOD IN STOOL, OCCULT [R19.5] 09/28/2007  . HERNIORRHAPHY, HX OF [Z98.89] 09/28/2007   Subjective Data: Pt transferred back from medical floor after management of AKI, currently is alert, oriented , AH /Paranoia improving Pt will be restarted on medications , continue  treatment.  Continued Clinical Symptoms:  Alcohol Use Disorder Identification Test Final Score (AUDIT): 0 The "Alcohol Use Disorders Identification Test", Guidelines for Use in Primary Care, Second Edition.  World Pharmacologist [facility]). Score between 0-7:  no or low risk or alcohol related problems. Score between 8-15:  moderate risk of alcohol related problems. Score between 16-19:  high risk of alcohol related problems. Score 20 or above:  warrants further diagnostic evaluation for alcohol dependence and treatment.   CLINICAL FACTORS:   Previous Psychiatric Diagnoses and Treatments   Musculoskeletal: Strength & Muscle Tone: within normal limits Gait & Station: normal Patient leans: N/A  Psychiatric Specialty Exam: Physical Exam  Review of Systems  Psychiatric/Behavioral: Positive for depression and hallucinations. The patient is nervous/anxious.   All other systems reviewed and are negative.   Blood pressure 122/84, pulse (!) 107, temperature 98.3 F (36.8 C), temperature source Oral, resp. rate 18, height 5' 3.78" (1.62 m), weight 76.7 kg (169 lb).Body mass index is 29.21 kg/m.  General Appearance: Casual  Eye Contact:  Fair  Speech:  Slow  Volume:  Decreased  Mood:  Anxious and Depressed  Affect:  Constricted  Thought Process:  Goal Directed and Descriptions of Associations: Intact  Orientation:  Full (Time, Place, and Person)  Thought Content:  Hallucinations: Auditory and Paranoid Ideation  Suicidal Thoughts:  No  Homicidal Thoughts:  No  Memory:  Immediate;   Fair Recent;   Fair Remote;   Poor  Judgement:  Fair  Insight:  Fair  Psychomotor Activity:  Normal  Concentration:  Concentration: Fair and Attention Span: Fair  Recall:  [employer] of Knowledge:  Fair  Language:  Fair  Akathisia:  No  Handed:  Right  AIMS (if indicated):     Assets:  Communication Skills Desire for Improvement  ADL's:  Intact  Cognition:  WNL  Sleep:  Number of Hours: 6.75       COGNITIVE FEATURES THAT CONTRIBUTE TO RISK:  Closed-mindedness, Polarized thinking and Thought constriction (tunnel vision)    SUICIDE RISK:   Mild:  Suicidal ideation of limited frequency, intensity, duration, and specificity.  There are no identifiable plans, no associated intent, mild dysphoria and related symptoms, good self-control (both objective and subjective assessment), few other risk factors, and identifiable protective factors, including available and accessible social support.  PLAN OF CARE: case discussed with NP , continue medications as per MAR.  I certify that inpatient services furnished can reasonably be expected to improve the patient's condition.   Damire Remedios, MD 04/19/2017, 10:50 AM

## 2017-04-19 NOTE — Progress Notes (Signed)
Adult Psychoeducational Group Note  Date:  04/19/2017 Time:  8:56 PM  Group Topic/Focus:  Wrap-Up Group:   The focus of this group is to help patients review their daily goal of treatment and discuss progress on daily workbooks.  Participation Level:  Active  Participation Quality:  Appropriate  Affect:  Appropriate  Cognitive:  Appropriate  Insight: Appropriate  Engagement in Group:  Engaged  Modes of Intervention:  Discussion  Additional Comments: The patient rates today a 5.The patient also said that groups where about communication.  Nash Shearer 04/19/2017, 8:56 PM

## 2017-04-20 LAB — GLUCOSE, CAPILLARY
Glucose-Capillary: 105 mg/dL — ABNORMAL HIGH (ref 65–99)
Glucose-Capillary: 97 mg/dL (ref 65–99)

## 2017-04-20 MED ORDER — LIDOCAINE 5 % EX PTCH
1.0000 | MEDICATED_PATCH | CUTANEOUS | Status: DC
  Administered 2017-04-20 – 2017-04-21 (×2): 1 via TRANSDERMAL
  Filled 2017-04-20 (×5): qty 1

## 2017-04-20 MED ORDER — TRAMADOL HCL 50 MG PO TABS: 50.0000 mg | ORAL_TABLET | Freq: Two times a day (BID) | ORAL | Status: DC | PRN

## 2017-04-20 NOTE — Progress Notes (Signed)
Nursing Progress Note [id number]  D) Patient presents with flat affect and is minimal with writer this evening. Per MHT, patient was engaged and participated in group. Patient is resting in bed this evening and denies needs or concerns for Probation officer. Patient compliant with medications. Patient denies SI/HI/AVH or pain and contracts for safety on the unit.  A) Emotional support given. 1:1 interaction and active listening provided. Patient medicated as prescribed. Medications and plan of care reviewed with patient. Patient verbalized understanding without further questions. Snacks and fluids provided. Opportunities for questions or concerns presented to patient. Patient encouraged to continue to work on treatment goals. Labs, vital signs and patient behavior monitored throughout shift. Patient safety maintained with q15 min safety checks. Low fall risk precautions in place and reviewed with patient; patient verbalized understanding.  R) Patient receptive to interaction with nurse. Patient remains safe on the unit at this time. Patient denies any adverse medication reactions at this time. Patient is resting in bed without complaints. Will continue to monitor.

## 2017-04-20 NOTE — Progress Notes (Signed)
[facility] Group Notes:  (Nursing/MHT/Case Management/Adjunct)  Date:  04/20/2017  Time:  9:19 PM  Type of Therapy:  Psychoeducational Skills  Participation Level:  Active  Participation Quality:  Appropriate  Affect:  Appropriate  Cognitive:  Appropriate  Insight:  Good  Engagement in Group:  Engaged  Modes of Intervention:  Education  Summary of Progress/Problems: Patient verbalized that he attended all of his groups and enjoyed the music group in particular. He also stated that he had a good visit with his wife this evening. In terms of the theme for the day, his support system will be comprised of his family and wife.   Travis [name] S 04/20/2017, 9:19 PM

## 2017-04-20 NOTE — Plan of Care (Signed)
Problem: Activity: Goal: Interest or engagement in activities will improve Outcome: Progressing Patient attended group this evening and was engaged/participated.

## 2017-04-20 NOTE — Progress Notes (Signed)
D: Patient's self inventory sheet: patient has good  Appetite, normal energy level, good concentration. Rated depression 6/10, hopeless 6/10, anxiety 5/10. SI/HI/AVH: continues to endorse passive SI, denies psychotic symptoms but appears to be responding to internal stimuli. Physical complaints are denied. Goal is "working on things".Depressed affect, isolative while present in the milieu. A: Medications administered, assessed medication knowledge and education given on medication regimen.  Emotional support and encouragement given patient. R: Denies SI and HI , contracts for safety. Safety maintained with 15 minute checks.

## 2017-04-20 NOTE — [facility] Group Notes (Signed)
[facility] LCSW Group Therapy Note  Date/Time:  04/20/2017  11:00AM-12:00PM  Type of Therapy and Topic:  Group Therapy:  Music and Mood  Participation Level:  Active   Description of Group: In this process group, members listened to a variety of genres of music and identified that different types of music evoke different responses.  Patients were encouraged to identify music that was soothing for them and music that was energizing for them.  Patients discussed how this knowledge can help with wellness and recovery in various ways including managing depression and anxiety as well as encouraging healthy sleep habits.    Therapeutic Goals: 1. Patients will explore the impact of different varieties of music on mood 2. Patients will verbalize the thoughts they have when listening to different types of music 3. Patients will identify music that is soothing to them as well as music that is energizing to them 4. Patients will discuss how to use this knowledge to assist in maintaining wellness and recovery 5. Patients will explore the use of music as a coping skill  Summary of Patient Progress:  At the beginning of group, patient expressed that he was feeling happy but a little anxious.  At the end of group he stated he felt "good inside."  Therapeutic Modalities: Solution Focused Brief Therapy Motivational Interviewing Activity   Selmer Dominion, LCSW 04/20/2017 8:35 AM

## 2017-04-20 NOTE — Progress Notes (Signed)
[facility] MD Progress Note  04/20/2017 3:30 PM Travis [name]  MRN:  [id number] Subjective: Pt states " I feel ok.'  Objective:Patient seen and chart reviewed.Discussed patient with treatment team.  Pt today seen as depressed, withdrawn , however is less paranoid . Pt denies any AH today . Reports he is not too focussed on his SI. Tolerating medications well, continues to need support.     Principal Problem: Major depressive disorder, recurrent episode, severe, with psychosis ([place]) Diagnosis:   Patient Active Problem List   Diagnosis Date Noted  . Major depressive disorder, recurrent episode, severe, with psychosis ([place]) [F33.3] 04/19/2017  . Depression [F32.9] 04/16/2017  . AKI (acute kidney injury) ([place]) [N17.9]   . Lactic acid acidosis [E87.2]   . Left sided numbness [R20.0]   . Sepsis ([place]) [A41.9]   . Elevated lactic acid level [R79.89]   . Leukocytosis [D72.829]   . Volume depletion [E86.9]   . Hypotension [I95.9] 03/13/2017  . Chest pain with moderate risk for cardiac etiology [R07.9] 01/13/2014  . Dyspnea [R06.00] 01/13/2014  . Family history of coronary artery disease- (mother had MI 62) [Z82.49] 01/13/2014  . Tobacco chew use [Z72.0]   . Type 2 diabetes mellitus ([place]) [E11.9]   . Dyslipidemia [E78.5]   . Unspecified vitamin D deficiency [E55.9]   . 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 Esophageal reflux [K21.9] 09/28/2007  . DYSPHAGIA UNSPECIFIED [R13.10] 09/28/2007  . BLOOD IN STOOL, OCCULT [R19.5] 09/28/2007  . HERNIORRHAPHY, HX OF [Z98.89] 09/28/2007   Total Time spent with patient: 20 minutes  Past Psychiatric History: Please see H&P.   Past Medical History:  Past Medical History:  Diagnosis Date  . Dyslipidemia   . Fatigue    NAUSEA,BLOATING  . H. pylori infection 09/15/2006  . Hematochezia 08/30/2007  . History of nuclear stress test 04/03/2009   exercise myoview; normal pattern of perfusion; low risk scan   . Hypertension     treated  . NIDDM (non-insulin dependent diabetes mellitus) 2011   type 2  . Schizoaffective disorder ([place])   . Tobacco chew use   . Unspecified vitamin D deficiency     Past Surgical History:  Procedure Laterality Date  . APPENDECTOMY    . HERNIA REPAIR     RIGHT  . KNEE SURGERY     BOTH  . TRANSTHORACIC ECHOCARDIOGRAM  04/03/2009   EF=>55%; trace TR, mild pulm regurg   Family History:  Family History  Problem Relation Age of Onset  . Stroke Father 23  . Pneumonia Father   . Heart failure Mother 22  . Coronary artery disease Mother        also MI  . Heart attack Mother   . Heart attack Maternal Uncle        died in 51s  . Cancer Unknown        x2; maternal side  . Diabetes Maternal Grandmother    Family Psychiatric  History: Please see H&P.  Social History:  History  Alcohol Use No     History  Drug Use No    Social History   Social History  . Marital status: Married    Spouse name: N/A  . Number of children: 2  . Years of education: N/A   Occupational History  .  Unemployed   Social History Main Topics  . Smoking status: Never Smoker  . Smokeless tobacco: Current User    Types: Snuff, Chew     Comment: started chewing  tobacco at age 7 years old-stopped chewing and dips snuff only  . Alcohol use No  . Drug use: No  . Sexual activity: Not Currently   Other Topics Concern  . None   Social History Narrative  . None   Additional Social History:                         Sleep: Fair  Appetite:  Fair  Current Medications: Current Facility-Administered Medications  Medication Dose Route Frequency Provider Last Rate Last Dose  . acetaminophen (TYLENOL) tablet 650 mg  650 mg Oral Q6H PRN Okonkwo, Justina A, NP      . alum & mag hydroxide-simeth (MAALOX/MYLANTA) 200-200-20 MG/5ML suspension 30 mL  30 mL Oral Q4H PRN Okonkwo, Justina A, NP      . amLODipine (NORVASC) tablet 10 mg  10 mg Oral Daily Okonkwo, Justina A, NP   10 mg at 04/20/17  0831  . aspirin EC tablet 81 mg  81 mg Oral Daily Okonkwo, Justina A, NP   81 mg at 04/20/17 0831  . chlorhexidine (PERIDEX) 0.12 % solution 15 mL  15 mL Mouth/Throat BID Okonkwo, Justina A, NP   15 mL at 04/20/17 0833  . FLUoxetine (PROZAC) capsule 20 mg  20 mg Oral Daily Okonkwo, Justina A, NP   20 mg at 04/20/17 0830  . glipiZIDE (GLUCOTROL XL) 24 hr tablet 10 mg  10 mg Oral Daily Okonkwo, Justina A, NP   10 mg at 04/20/17 0830  . hydrOXYzine (ATARAX/VISTARIL) tablet 25 mg  25 mg Oral TID PRN Okonkwo, Justina A, NP      . lidocaine (LIDODERM) 5 % 1 patch  1 patch Transdermal Q24H Erikka Follmer, MD      . loratadine (CLARITIN) tablet 10 mg  10 mg Oral Daily Okonkwo, Justina A, NP   10 mg at 04/20/17 0831  . magnesium hydroxide (MILK OF MAGNESIA) suspension 30 mL  30 mL Oral Daily PRN Okonkwo, Justina A, NP      . metFORMIN (GLUCOPHAGE) tablet 1,000 mg  1,000 mg Oral Q breakfast Okonkwo, Justina A, NP   1,000 mg at 04/20/17 0830  . nitroGLYCERIN (NITROSTAT) SL tablet 0.4 mg  0.4 mg Sublingual Q5 min PRN Okonkwo, Justina A, NP      . pantoprazole (PROTONIX) EC tablet 40 mg  40 mg Oral Daily Okonkwo, Justina A, NP   40 mg at 04/20/17 0831  . polyethylene glycol (MIRALAX / GLYCOLAX) packet 17 g  17 g Oral BID Okonkwo, Justina A, NP   17 g at 04/19/17 0805  . potassium chloride SA (K-DUR,KLOR-CON) CR tablet 20 mEq  20 mEq Oral BID Derrill Center, NP   20 mEq at 04/20/17 0831  . risperiDONE (RISPERDAL) tablet 0.5 mg  0.5 mg Oral Daily Okonkwo, Justina A, NP   0.5 mg at 04/20/17 0830  . risperiDONE (RISPERDAL) tablet 1 mg  1 mg Oral QHS Okonkwo, Justina A, NP   1 mg at 04/19/17 2108  . saccharomyces boulardii (FLORASTOR) capsule 250 mg  250 mg Oral BID Okonkwo, Justina A, NP   250 mg at 04/20/17 0830  . senna-docusate (Senokot-S) tablet 1 tablet  1 tablet Oral BID Okonkwo, Justina A, NP   1 tablet at 04/20/17 0830  . tamsulosin (FLOMAX) capsule 0.4 mg  0.4 mg Oral QPC breakfast Okonkwo, Justina A, NP    0.4 mg at 04/20/17 1210  . traMADol (ULTRAM) tablet 50 mg  50  mg Oral Q12H PRN Ursula Alert, MD      . traZODone (DESYREL) tablet 100 mg  100 mg Oral QHS Okonkwo, Justina A, NP   100 mg at 04/19/17 2108    Lab Results:  Results for orders placed or performed during the hospital encounter of 04/18/17 (from the past 48 hour(s))  Glucose, capillary     Status: None   Collection Time: 04/18/17  4:40 PM  Result Value Ref Range   Glucose-Capillary 99 65 - 99 mg/dL  Glucose, capillary     Status: Abnormal   Collection Time: 04/19/17  5:47 AM  Result Value Ref Range   Glucose-Capillary 106 (H) 65 - 99 mg/dL  Glucose, capillary     Status: Abnormal   Collection Time: 04/19/17  6:44 PM  Result Value Ref Range   Glucose-Capillary 125 (H) 65 - 99 mg/dL  Glucose, capillary     Status: Abnormal   Collection Time: 04/20/17  6:07 AM  Result Value Ref Range   Glucose-Capillary 105 (H) 65 - 99 mg/dL    Blood Alcohol level:  Lab Results  Component Value Date   ETH <5 69/67/8938    Metabolic Disorder Labs: Lab Results  Component Value Date   HGBA1C 6.3 (H) 04/16/2017   MPG 134.11 04/16/2017   No results found for: PROLACTIN Lab Results  Component Value Date   CHOL 152 04/16/2017   TRIG 209 (H) 04/16/2017   HDL 23 (L) 04/16/2017   CHOLHDL 6.6 04/16/2017   VLDL 42 (H) 04/16/2017   LDLCALC 87 04/16/2017    Physical Findings: AIMS: Facial and Oral Movements Muscles of Facial Expression: None, normal Lips and Perioral Area: None, normal Jaw: None, normal Tongue: None, normal,Extremity Movements Upper (arms, wrists, hands, fingers): None, normal Lower (legs, knees, ankles, toes): None, normal, Trunk Movements Neck, shoulders, hips: None, normal, Overall Severity Severity of abnormal movements (highest score from questions above): None, normal Incapacitation due to abnormal movements: None, normal Patient's awareness of abnormal movements (rate only patient's report): No  Awareness, Dental Status Current problems with teeth and/or dentures?: No Does patient usually wear dentures?: No  CIWA:    COWS:     Musculoskeletal: Strength & Muscle Tone: within normal limits Gait & Station: normal Patient leans: N/A  Psychiatric Specialty Exam: Physical Exam  Nursing note and vitals reviewed.   Review of Systems  Psychiatric/Behavioral: The patient is nervous/anxious.   All other systems reviewed and are negative.   Blood pressure 113/73, pulse (!) 112, temperature 98.9 F (37.2 C), resp. rate 20, height 5' 3.78" (1.62 m), weight 76.7 kg (169 lb).Body mass index is 29.21 kg/m.  General Appearance: Guarded  Eye Contact:  Fair  Speech:  Normal Rate  Volume:  Normal  Mood:  Anxious and Dysphoric  Affect:  Congruent  Thought Process:  Goal Directed and Descriptions of Associations: Circumstantial  Orientation:  Full (Time, Place, and Person)  Thought Content:  Paranoid Ideation and Rumination  Suicidal Thoughts:  No  Homicidal Thoughts:  No  Memory:  Immediate;   Fair Recent;   Fair Remote;   Fair  Judgement:  Fair  Insight:  Fair  Psychomotor Activity:  Normal  Concentration:  Concentration: Fair and Attention Span: Fair  Recall:  [employer] of Knowledge:  Fair  Language:  Fair  Akathisia:  No  Handed:  Right  AIMS (if indicated):     Assets:  Communication Skills Desire for Improvement  ADL's:  Intact  Cognition:  WNL  Sleep:  Number of Hours: 6.75   Will continue today 04/20/17  plan as below except where it is noted.   Treatment Plan Summary:Patient with depression, psychosis , developed AKI , was medically managed , transferred back to [facility] , continue to improve .  Daily contact with patient to assess and evaluate symptoms and progress in treatment, Medication management and Plan see below   Prozac 20 mg po daily for affective sx Risperidone 0.5 mg po daily and 1 mg po qhs for psychosis. Trazodone 100 mg po qhs for sleep  . Restarted home medications as per DC instructions from Hospitalist Will repeat BMP tomorrow AM. CSW will continue to work on disposition. Alzada Brazee, MD 04/20/2017, 3:30 PM

## 2017-04-21 DIAGNOSIS — Z560 Unemployment, unspecified: Secondary | ICD-10-CM

## 2017-04-21 DIAGNOSIS — R450 Nervousness: Secondary | ICD-10-CM

## 2017-04-21 DIAGNOSIS — M542 Cervicalgia: Secondary | ICD-10-CM

## 2017-04-21 DIAGNOSIS — F29 Unspecified psychosis not due to a substance or known physiological condition: Secondary | ICD-10-CM

## 2017-04-21 LAB — BASIC METABOLIC PANEL
Anion gap: 10 (ref 5–15)
BUN: 20 mg/dL (ref 6–20)
CO2: 25 mmol/L (ref 22–32)
Calcium: 9.9 mg/dL (ref 8.9–10.3)
Chloride: 106 mmol/L (ref 101–111)
Creatinine, Ser: 1.25 mg/dL — ABNORMAL HIGH (ref 0.61–1.24)
GFR calc Af Amer: >60 mL/min (ref >60)
GFR calc non Af Amer: >60 mL/min (ref >60)
Glucose, Bld: 113 mg/dL — ABNORMAL HIGH (ref 65–99)
Potassium: 3.8 mmol/L (ref 3.5–5.1)
Sodium: 141 mmol/L (ref 135–145)

## 2017-04-21 LAB — GLUCOSE, CAPILLARY
Glucose-Capillary: 115 mg/dL — ABNORMAL HIGH (ref 65–99)
Glucose-Capillary: 62 mg/dL — ABNORMAL LOW (ref 65–99)
Glucose-Capillary: 117 mg/dL — ABNORMAL HIGH (ref 65–99)
Glucose-Capillary: 94 mg/dL (ref 65–99)

## 2017-04-21 NOTE — Plan of Care (Signed)
Problem: Activity: Goal: Sleeping patterns will improve Outcome: Progressing Patient reports sleeping well with current medication regimen.

## 2017-04-21 NOTE — Tx Team (Signed)
Interdisciplinary Treatment and Diagnostic Plan Update  04/21/2017 Time of Session: 1:37 PM  Travis [name] MRN: [id number]  Principal Diagnosis: Major depressive disorder, recurrent episode, severe, with psychosis ([place])  Secondary Diagnoses: Principal Problem:   Major depressive disorder, recurrent episode, severe, with psychosis ([place])   Current Medications:  Current Facility-Administered Medications  Medication Dose Route Frequency Provider Last Rate Last Dose  . acetaminophen (TYLENOL) tablet 650 mg  650 mg Oral Q6H PRN Okonkwo, Justina A, NP      . alum & mag hydroxide-simeth (MAALOX/MYLANTA) 200-200-20 MG/5ML suspension 30 mL  30 mL Oral Q4H PRN Okonkwo, Justina A, NP      . amLODipine (NORVASC) tablet 10 mg  10 mg Oral Daily Okonkwo, Justina A, NP   10 mg at 04/21/17 0017  . aspirin EC tablet 81 mg  81 mg Oral Daily Okonkwo, Justina A, NP   81 mg at 04/21/17 0834  . chlorhexidine (PERIDEX) 0.12 % solution 15 mL  15 mL Mouth/Throat BID Okonkwo, Justina A, NP   15 mL at 04/21/17 0835  . FLUoxetine (PROZAC) capsule 20 mg  20 mg Oral Daily Okonkwo, Justina A, NP   20 mg at 04/21/17 4944  . glipiZIDE (GLUCOTROL XL) 24 hr tablet 10 mg  10 mg Oral Daily Okonkwo, Justina A, NP   10 mg at 04/21/17 9675  . hydrOXYzine (ATARAX/VISTARIL) tablet 25 mg  25 mg Oral TID PRN Okonkwo, Justina A, NP      . lidocaine (LIDODERM) 5 % 1 patch  1 patch Transdermal Q24H Ursula Alert, MD   1 patch at 04/21/17 1117  . loratadine (CLARITIN) tablet 10 mg  10 mg Oral Daily Okonkwo, Justina A, NP   10 mg at 04/21/17 9163  . magnesium hydroxide (MILK OF MAGNESIA) suspension 30 mL  30 mL Oral Daily PRN Okonkwo, Justina A, NP      . metFORMIN (GLUCOPHAGE) tablet 1,000 mg  1,000 mg Oral Q breakfast Okonkwo, Justina A, NP   1,000 mg at 04/21/17 0834  . nitroGLYCERIN (NITROSTAT) SL tablet 0.4 mg  0.4 mg Sublingual Q5 min PRN Okonkwo, Justina A, NP      . pantoprazole (PROTONIX) EC tablet 40 mg  40 mg Oral Daily  Okonkwo, Justina A, NP   40 mg at 04/21/17 8466  . polyethylene glycol (MIRALAX / GLYCOLAX) packet 17 g  17 g Oral BID Okonkwo, Justina A, NP   17 g at 04/21/17 0830  . potassium chloride SA (K-DUR,KLOR-CON) CR tablet 20 mEq  20 mEq Oral BID Derrill Center, NP   20 mEq at 04/21/17 5993  . risperiDONE (RISPERDAL) tablet 0.5 mg  0.5 mg Oral Daily Okonkwo, Justina A, NP   0.5 mg at 04/21/17 0834  . risperiDONE (RISPERDAL) tablet 1 mg  1 mg Oral QHS Okonkwo, Justina A, NP   1 mg at 04/20/17 2209  . saccharomyces boulardii (FLORASTOR) capsule 250 mg  250 mg Oral BID Okonkwo, Justina A, NP   250 mg at 04/21/17 5701  . senna-docusate (Senokot-S) tablet 1 tablet  1 tablet Oral BID Lu Duffel, Justina A, NP   1 tablet at 04/21/17 0832  . tamsulosin (FLOMAX) capsule 0.4 mg  0.4 mg Oral QPC breakfast Okonkwo, Justina A, NP   0.4 mg at 04/21/17 1116  . traMADol (ULTRAM) tablet 50 mg  50 mg Oral Q12H PRN Eappen, Saramma, MD      . traZODone (DESYREL) tablet 100 mg  100 mg Oral QHS Okonkwo, Justina A, NP  100 mg at 04/20/17 2210    PTA Medications: Prescriptions Prior to Admission  Medication Sig Dispense Refill Last Dose  . acetaminophen (TYLENOL) 325 MG tablet Take 650 mg by mouth every 6 (six) hours as needed for mild pain.   Past Week at Unknown time  . amLODipine (NORVASC) 10 MG tablet Take 10 mg by mouth daily.   Past Week at Unknown time  . aspirin EC 81 MG tablet Take 81 mg by mouth daily.   Past Week at Unknown time  . chlorhexidine (PERIDEX) 0.12 % solution Use as directed 15 mLs in the mouth or throat 2 (two) times daily.   04/13/2017  . FLUoxetine (PROZAC) 20 MG capsule Take 1 capsule (20 mg total) by mouth daily.  3   . glipiZIDE (GLUCOTROL XL) 10 MG 24 hr tablet Take 10 mg by mouth daily.    Past Week at Unknown time  . HYDROcodone-acetaminophen (NORCO/VICODIN) 5-325 MG tablet Take 1 tablet by mouth every 6 (six) hours as needed for moderate pain.    Past Week at Unknown time  . indomethacin  (INDOCIN) 25 MG capsule      . loratadine (CLARITIN) 10 MG tablet Take 10 mg by mouth daily.   Past Week at Unknown time  . metFORMIN (GLUCOPHAGE) 1000 MG tablet Take 1,000 mg by mouth daily.    Past Week at Unknown time  . nitroGLYCERIN (NITROSTAT) 0.4 MG SL tablet Place 0.4 mg under the tongue every 5 (five) minutes as needed for chest pain.   unknown  . pantoprazole (PROTONIX) 40 MG tablet Take 1 tablet (40 mg total) by mouth daily. 30 tablet 0 Past Week at Unknown time  . polyethylene glycol (MIRALAX / GLYCOLAX) packet Take 17 g by mouth 2 (two) times daily. 14 each 0   . risperiDONE (RISPERDAL) 0.5 MG tablet Take 1 tablet (0.5 mg total) by mouth daily.     . risperiDONE (RISPERDAL) 1 MG tablet Take 1 tablet (1 mg total) by mouth at bedtime.     . saccharomyces boulardii (FLORASTOR) 250 MG capsule Take 1 capsule (250 mg total) by mouth 2 (two) times daily. 60 capsule 0 Past Week at Unknown time  . senna-docusate (SENOKOT-S) 8.6-50 MG tablet Take 1 tablet by mouth 2 (two) times daily.     . tamsulosin (FLOMAX) 0.4 MG CAPS capsule Take 0.4 mg by mouth daily after breakfast.   Past Week at Unknown time  . traMADol (ULTRAM) 50 MG tablet Take 1 tablet (50 mg total) by mouth every 6 (six) hours as needed for moderate pain. 30 tablet 0   . traZODone (DESYREL) 100 MG tablet Take 100 mg by mouth at bedtime.    Past Week at Unknown time    Patient Stressors: Other: Psychosis  Patient Strengths: Agricultural engineer for treatment/growth Supportive family/friends  Treatment Modalities: Medication Management, Group therapy, Case management,  1 to 1 session with clinician, Psychoeducation, Recreational therapy.   Physician Treatment Plan for Primary Diagnosis: Major depressive disorder, recurrent episode, severe, with psychosis ([place]) Long Term Goal(s): Improvement in symptoms so as ready for discharge  Short Term Goals: Ability to verbalize feelings will improve Ability to disclose and  discuss suicidal ideas Compliance with prescribed medications will improve Ability to verbalize feelings will improve Ability to disclose and discuss suicidal ideas Compliance with prescribed medications will improve  Medication Management: Evaluate patient's response, side effects, and tolerance of medication regimen.  Therapeutic Interventions: 1 to 1 sessions, Unit Group sessions and Medication  administration.  Evaluation of Outcomes: Adequate for Discharge  Physician Treatment Plan for Secondary Diagnosis: Principal Problem:   Major depressive disorder, recurrent episode, severe, with psychosis ([place])   Long Term Goal(s): Improvement in symptoms so as ready for discharge  Short Term Goals: Ability to verbalize feelings will improve Ability to disclose and discuss suicidal ideas Compliance with prescribed medications will improve Ability to verbalize feelings will improve Ability to disclose and discuss suicidal ideas Compliance with prescribed medications will improve  Medication Management: Evaluate patient's response, side effects, and tolerance of medication regimen.  Therapeutic Interventions: 1 to 1 sessions, Unit Group sessions and Medication administration.  Evaluation of Outcomes: Adequate for Discharge   RN Treatment Plan for Primary Diagnosis: Major depressive disorder, recurrent episode, severe, with psychosis ([place]) Long Term Goal(s): Knowledge of disease and therapeutic regimen to maintain health will improve  Short Term Goals: Ability to identify and develop effective coping behaviors will improve and Compliance with prescribed medications will improve  Medication Management: RN will administer medications as ordered by provider, will assess and evaluate patient's response and provide education to patient for prescribed medication. RN will report any adverse and/or side effects to prescribing provider.  Therapeutic Interventions: 1 on 1 counseling sessions,  Psychoeducation, Medication administration, Evaluate responses to treatment, Monitor vital signs and CBGs as ordered, Perform/monitor CIWA, COWS, AIMS and Fall Risk screenings as ordered, Perform wound care treatments as ordered.  Evaluation of Outcomes: Adequate for Discharge   LCSW Treatment Plan for Primary Diagnosis: Major depressive disorder, recurrent episode, severe, with psychosis ([place]) Long Term Goal(s): Safe transition to appropriate next level of care at discharge, Engage patient in therapeutic group addressing interpersonal concerns.  Short Term Goals: Engage patient in aftercare planning with referrals and resources  Therapeutic Interventions: Assess for all discharge needs, 1 to 1 time with Social worker, Explore available resources and support systems, Assess for adequacy in community support network, Educate family and significant other(s) on suicide prevention, Complete Psychosocial Assessment, Interpersonal group therapy.  Evaluation of Outcomes: Met  Return home, follow up at [facility] in Treatment: Attending groups: Yes Participating in groups: Yes Taking medication as prescribed: Yes Toleration medication: Yes, no side effects reported at this time Family/Significant other contact made: Yes Patient understands diagnosis: No Limited insight Discussing patient identified problems/goals with staff: Yes Medical problems stabilized or resolved: Yes Denies suicidal/homicidal ideation: Yes Issues/concerns per patient self-inventory: None Other: N/A  New problem(s) identified: None identified at this time.   New Short Term/Long Term Goal(s): None identified at this time.   Discharge Plan or Barriers:   Reason for Continuation of Hospitalization:  Medication stabilization   Estimated Length of Stay:Likely d/c tomorrow, Wednesday at the latest  Attendees: Patient: 04/21/2017  1:37 PM  Physician: Ursula Alert, MD 04/21/2017  1:37 PM  Nursing: Alison Murray, RN 04/21/2017  1:37 PM  RN Care Manager: Lars Pinks, RN 04/21/2017  1:37 PM  Social Worker: Ripley Fraise 04/21/2017  1:37 PM  Recreational Therapist: Winfield Cunas 04/21/2017  1:37 PM  Other: Norberto Sorenson 04/21/2017  1:37 PM  Other:  04/21/2017  1:37 PM    Scribe for Treatment Team:  Roque Lias LCSW 04/21/2017 1:37 PM

## 2017-04-21 NOTE — Progress Notes (Signed)
Nursing Progress Note [id number]  D) Patient presents with flat affect and is visible up in the milieu this evening. Patient CBG monitored and WNL. Patient complained of mild neck pain and requested tylenol. Patient denied SI/HI/AVH and contracts for safety on the unit. Patient compliant with medications this evening.  A) Emotional support given. 1:1 interaction and active listening provided. Patient medicated as prescribed. Medications and plan of care reviewed with patient. Patient verbalized understanding without further questions. Snacks and fluids provided. Opportunities for questions or concerns presented to patient. Patient encouraged to continue to work on treatment goals. Labs, vital signs and patient behavior monitored throughout shift. Patient safety maintained with q15 min safety checks. Low fall risk precautions in place and reviewed with patient; patient verbalized understanding.  R) Patient receptive to interaction with nurse. Patient remains safe on the unit at this time. Patient denies any adverse medication reactions at this time. Patient is resting in bed without complaints. Will continue to monitor.

## 2017-04-21 NOTE — Progress Notes (Signed)
Recreation Therapy Notes  Date: 04/21/17 Time: 1015 Location: 500 [address] Dayroom  Group Topic: Life Goals, Goal Setting  Goal Area(s) Addresses:  Patient will be able to identify at least 3 life goals.  Patient will be able to identify benefit of investing in life goals.  Patient will be able to identify benefit of setting life goals.   Behavioral Response:  Minimal  Intervention: Goal sheet, pencils  Activity: Life Goals.  Patients were given a worksheet that identified six areas (family, friends, spirituality, work/school, body and mental health).  Patients were to identify the things they do well, what they needed to improve and set a goal to make the improvement.  Patients would then pick their top three categories to share with the group.  Education:  Discharge Planning, Coping Skills, Life Goals   Education Outcome: Acknowledges Education/In Group Clarification Provided/Needs Additional Education  Clinical Observations:   Pt arrived late and worked on sheet for remainder of group but did not get to share it.   Victorino Sparrow, LRT/CTRS         Victorino Sparrow A 04/21/2017 12:40 PM

## 2017-04-21 NOTE — [facility] Group Notes (Signed)
[facility] LCSW Group Therapy  04/21/2017  1:05 PM  Type of Therapy:  Group therapy  Participation Level:  Active  Participation Quality:  Attentive  Affect:  Flat  Cognitive:  Oriented  Insight:  Limited  Engagement in Therapy:  Limited  Modes of Intervention:  Discussion, Socialization  Summary of Progress/Problems:  Chaplain was here to lead a group on themes of hope and courage.  Stayed the entire time, unable to engaged level of engagement.  Gave the speaker minimal eye contact.  At the end, he shared that he knows he needs to take his medications outside of here.  Travis [name] B 04/21/2017 1:32 PM

## 2017-04-21 NOTE — Progress Notes (Signed)
Adult Psychoeducational Group Note  Date:  04/21/2017 Time:  9:20 PM  Group Topic/Focus:  Wrap-Up Group:   The focus of this group is to help patients review their daily goal of treatment and discuss progress on daily workbooks.  Participation Level:  Active  Participation Quality:  Appropriate  Affect:  Appropriate  Cognitive:  Appropriate  Insight: Appropriate  Engagement in Group:  Engaged  Modes of Intervention:  Discussion  Additional Comments:  The patient expressed that he attended the Goals group.The patient also said that he rates today a 10.  Travis [name] 04/21/2017, 9:20 PM

## 2017-04-21 NOTE — Progress Notes (Signed)
Patient ID: Travis [name], male   DOB: 09/03/1960, 56 y.o.   MRN: [id number] D) Pt affect blank, suspicious. Minimal interaction with staff or peers. Pt is guarded on approach however has been cooperative. Psychomotor retardation present. Pt has been positive for groups and meals with prompting. Pt positive for BM this shift.  Pt denies s.i., avh. A) level 3 obs for safety, support and reassurance provided. Med ed reinforced. Prompting as needed. R) Cooperative. Minimal interaction.

## 2017-04-21 NOTE — Progress Notes (Signed)
[facility] MD Progress Note  04/21/2017 11:59 AM Travis [name]  MRN:  [id number] Subjective: Travis [name] reports " I am feeling good, just ready to go back home with my wife"   Objective:Travis [name] is awake, alert and oriented. Seen standing at the medication window. patient presents with a flat affect, but is pleasant. Denies suicidal or homicidal ideation during this assessment. Denies auditory or visual hallucination and does not appear to be responding to internal stimuli. Has concerns with mild neck and upper back pain/discomfort. Patient reports he is medication compliant without mediation side effects. Denies depression or depressive symptoms. 2/10.Reports good appetite and states he is  resting well. Patient report he is excited regarding discharge on tomorrow. Support, encouragement and reassurance was provided.      Principal Problem: Major depressive disorder, recurrent episode, severe, with psychosis ([place]) Diagnosis:   Patient Active Problem List   Diagnosis Date Noted  . Major depressive disorder, recurrent episode, severe, with psychosis ([place]) [F33.3] 04/19/2017  . Depression [F32.9] 04/16/2017  . AKI (acute kidney injury) ([place]) [N17.9]   . Lactic acid acidosis [E87.2]   . Left sided numbness [R20.0]   . Sepsis ([place]) [A41.9]   . Elevated lactic acid level [R79.89]   . Leukocytosis [D72.829]   . Volume depletion [E86.9]   . Hypotension [I95.9] 03/13/2017  . Chest pain with moderate risk for cardiac etiology [R07.9] 01/13/2014  . Dyspnea [R06.00] 01/13/2014  . Family history of coronary artery disease- (mother had MI 27) [Z82.49] 01/13/2014  . Tobacco chew use [Z72.0]   . Type 2 diabetes mellitus ([place]) [E11.9]   . Dyslipidemia [E78.5]   . Unspecified vitamin D deficiency [E55.9]   . 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 Esophageal reflux [K21.9] 09/28/2007  . DYSPHAGIA UNSPECIFIED [R13.10] 09/28/2007  . BLOOD IN STOOL, OCCULT [R19.5] 09/28/2007   . HERNIORRHAPHY, HX OF [Z98.89] 09/28/2007   Total Time spent with patient: 20 minutes  Past Psychiatric History: Please see H&P.   Past Medical History:  Past Medical History:  Diagnosis Date  . Dyslipidemia   . Fatigue    NAUSEA,BLOATING  . H. pylori infection 09/15/2006  . Hematochezia 08/30/2007  . History of nuclear stress test 04/03/2009   exercise myoview; normal pattern of perfusion; low risk scan   . Hypertension    treated  . NIDDM (non-insulin dependent diabetes mellitus) 2011   type 2  . Schizoaffective disorder ([place])   . Tobacco chew use   . Unspecified vitamin D deficiency     Past Surgical History:  Procedure Laterality Date  . APPENDECTOMY    . HERNIA REPAIR     RIGHT  . KNEE SURGERY     BOTH  . TRANSTHORACIC ECHOCARDIOGRAM  04/03/2009   EF=>55%; trace TR, mild pulm regurg   Family History:  Family History  Problem Relation Age of Onset  . Stroke Father 68  . Pneumonia Father   . Heart failure Mother 26  . Coronary artery disease Mother        also MI  . Heart attack Mother   . Heart attack Maternal Uncle        died in 22s  . Cancer Unknown        x2; maternal side  . Diabetes Maternal Grandmother    Family Psychiatric  History: Please see H&P.  Social History:  History  Alcohol Use No     History  Drug Use No    Social History   Social  History  . Marital status: Married    Spouse name: N/A  . Number of children: 2  . Years of education: N/A   Occupational History  .  Unemployed   Social History Main Topics  . Smoking status: Never Smoker  . Smokeless tobacco: Current User    Types: Snuff, Chew     Comment: started chewing tobacco at age 55 years old-stopped chewing and dips snuff only  . Alcohol use No  . Drug use: No  . Sexual activity: Not Currently   Other Topics Concern  . None   Social History Narrative  . None   Additional Social History:                         Sleep: Fair  Appetite:   Fair  Current Medications: Current Facility-Administered Medications  Medication Dose Route Frequency Provider Last Rate Last Dose  . acetaminophen (TYLENOL) tablet 650 mg  650 mg Oral Q6H PRN Okonkwo, Justina A, NP      . alum & mag hydroxide-simeth (MAALOX/MYLANTA) 200-200-20 MG/5ML suspension 30 mL  30 mL Oral Q4H PRN Okonkwo, Justina A, NP      . amLODipine (NORVASC) tablet 10 mg  10 mg Oral Daily Okonkwo, Justina A, NP   10 mg at 04/21/17 4128  . aspirin EC tablet 81 mg  81 mg Oral Daily Okonkwo, Justina A, NP   81 mg at 04/21/17 0834  . chlorhexidine (PERIDEX) 0.12 % solution 15 mL  15 mL Mouth/Throat BID Okonkwo, Justina A, NP   15 mL at 04/21/17 0835  . FLUoxetine (PROZAC) capsule 20 mg  20 mg Oral Daily Okonkwo, Justina A, NP   20 mg at 04/21/17 7867  . glipiZIDE (GLUCOTROL XL) 24 hr tablet 10 mg  10 mg Oral Daily Okonkwo, Justina A, NP   10 mg at 04/21/17 6720  . hydrOXYzine (ATARAX/VISTARIL) tablet 25 mg  25 mg Oral TID PRN Okonkwo, Justina A, NP      . lidocaine (LIDODERM) 5 % 1 patch  1 patch Transdermal Q24H Ursula Alert, MD   1 patch at 04/21/17 1117  . loratadine (CLARITIN) tablet 10 mg  10 mg Oral Daily Okonkwo, Justina A, NP   10 mg at 04/21/17 9470  . magnesium hydroxide (MILK OF MAGNESIA) suspension 30 mL  30 mL Oral Daily PRN Okonkwo, Justina A, NP      . metFORMIN (GLUCOPHAGE) tablet 1,000 mg  1,000 mg Oral Q breakfast Okonkwo, Justina A, NP   1,000 mg at 04/21/17 0834  . nitroGLYCERIN (NITROSTAT) SL tablet 0.4 mg  0.4 mg Sublingual Q5 min PRN Okonkwo, Justina A, NP      . pantoprazole (PROTONIX) EC tablet 40 mg  40 mg Oral Daily Okonkwo, Justina A, NP   40 mg at 04/21/17 9628  . polyethylene glycol (MIRALAX / GLYCOLAX) packet 17 g  17 g Oral BID Okonkwo, Justina A, NP   17 g at 04/21/17 0830  . potassium chloride SA (K-DUR,KLOR-CON) CR tablet 20 mEq  20 mEq Oral BID Derrill Center, NP   20 mEq at 04/21/17 3662  . risperiDONE (RISPERDAL) tablet 0.5 mg  0.5 mg Oral Daily  Okonkwo, Justina A, NP   0.5 mg at 04/21/17 0834  . risperiDONE (RISPERDAL) tablet 1 mg  1 mg Oral QHS Okonkwo, Justina A, NP   1 mg at 04/20/17 2209  . saccharomyces boulardii (FLORASTOR) capsule 250 mg  250 mg Oral BID Lu Duffel,  Justina A, NP   250 mg at 04/21/17 0832  . senna-docusate (Senokot-S) tablet 1 tablet  1 tablet Oral BID Lu Duffel, Justina A, NP   1 tablet at 04/21/17 0832  . tamsulosin (FLOMAX) capsule 0.4 mg  0.4 mg Oral QPC breakfast Okonkwo, Justina A, NP   0.4 mg at 04/21/17 1116  . traMADol (ULTRAM) tablet 50 mg  50 mg Oral Q12H PRN Ursula Alert, MD      . traZODone (DESYREL) tablet 100 mg  100 mg Oral QHS Okonkwo, Justina A, NP   100 mg at 04/20/17 2210    Lab Results:  Results for orders placed or performed during the hospital encounter of 04/18/17 (from the past 48 hour(s))  Glucose, capillary     Status: Abnormal   Collection Time: 04/19/17  6:44 PM  Result Value Ref Range   Glucose-Capillary 125 (H) 65 - 99 mg/dL  Glucose, capillary     Status: Abnormal   Collection Time: 04/20/17  6:07 AM  Result Value Ref Range   Glucose-Capillary 105 (H) 65 - 99 mg/dL  Glucose, capillary     Status: None   Collection Time: 04/20/17  5:12 PM  Result Value Ref Range   Glucose-Capillary 97 65 - 99 mg/dL  Glucose, capillary     Status: Abnormal   Collection Time: 04/21/17  5:58 AM  Result Value Ref Range   Glucose-Capillary 115 (H) 65 - 99 mg/dL  Basic metabolic panel     Status: Abnormal   Collection Time: 04/21/17  6:15 AM  Result Value Ref Range   Sodium 141 135 - 145 mmol/L   Potassium 3.8 3.5 - 5.1 mmol/L   Chloride 106 101 - 111 mmol/L   CO2 25 22 - 32 mmol/L   Glucose, Bld 113 (H) 65 - 99 mg/dL   BUN 20 6 - 20 mg/dL   Creatinine, Ser 1.25 (H) 0.61 - 1.24 mg/dL   Calcium 9.9 8.9 - 10.3 mg/dL   GFR calc non Af Amer >60 >60 mL/min   GFR calc Af Amer >60 >60 mL/min    Comment: (NOTE) The eGFR has been calculated using the CKD EPI equation. This calculation has not  been validated in all clinical situations. eGFR's persistently <60 mL/min signify possible Chronic Kidney Disease.    Anion gap 10 5 - 15    Comment: Performed at [facility], [place]ish [facility] [address], [place], [place] [address]    Blood Alcohol level:  Lab Results  Component Value Date   ETH <5 63/08/6008    Metabolic Disorder Labs: Lab Results  Component Value Date   HGBA1C 6.3 (H) 04/16/2017   MPG 134.11 04/16/2017   No results found for: PROLACTIN Lab Results  Component Value Date   CHOL 152 04/16/2017   TRIG 209 (H) 04/16/2017   HDL 23 (L) 04/16/2017   CHOLHDL 6.6 04/16/2017   VLDL 42 (H) 04/16/2017   LDLCALC 87 04/16/2017    Physical Findings: AIMS: Facial and Oral Movements Muscles of Facial Expression: None, normal Lips and Perioral Area: None, normal Jaw: None, normal Tongue: None, normal,Extremity Movements Upper (arms, wrists, hands, fingers): None, normal Lower (legs, knees, ankles, toes): None, normal, Trunk Movements Neck, shoulders, hips: None, normal, Overall Severity Severity of abnormal movements (highest score from questions above): None, normal Incapacitation due to abnormal movements: None, normal Patient's awareness of abnormal movements (rate only patient's report): No Awareness, Dental Status Current problems with teeth and/or dentures?: No Does patient usually wear dentures?: No  CIWA:    COWS:     Musculoskeletal: Strength & Muscle Tone: within normal limits Gait & Station: normal Patient leans: N/A  Psychiatric Specialty Exam: Physical Exam  Nursing note and vitals reviewed. Constitutional: He is oriented to person, place, and time.  Cardiovascular: Normal rate.   Musculoskeletal: Normal range of motion.  Neurological: He is oriented to person, place, and time.  Psychiatric: He has a normal mood and affect. His behavior is normal.    Review of Systems  Psychiatric/Behavioral: The patient is nervous/anxious.   All  other systems reviewed and are negative.   Blood pressure 113/73, pulse (!) 112, temperature 98.9 F (37.2 C), resp. rate 20, height 5' 3.78" (1.62 m), weight 76.7 kg (169 lb).Body mass index is 29.21 kg/m.  General Appearance: Guarded and pleasant   Eye Contact:  Fair  Speech:  Normal Rate  Volume:  Normal  Mood:  Anxious and Dysphoric  Affect:  Congruent  Thought Process:  Goal Directed and Descriptions of Associations: Circumstantial  Orientation:  Full (Time, Place, and Person)  Thought Content:  Paranoid Ideation and Rumination  Suicidal Thoughts:  No  Homicidal Thoughts:  No  Memory:  Immediate;   Fair Recent;   Fair Remote;   Fair  Judgement:  Fair  Insight:  Fair  Psychomotor Activity:  Normal  Concentration:  Concentration: Fair and Attention Span: Fair  Recall:  [employer] of Knowledge:  Fair  Language:  Fair  Akathisia:  No  Handed:  Right  AIMS (if indicated):     Assets:  Desire for Improvement Physical Health Social Support  ADL's:  Intact  Cognition:  WNL  Sleep:  Number of Hours: 6.5    I agree with current treatment plan on 04/21/2017, Patient seen face-to-face for psychiatric evaluation follow-up, chart reviewed and case discussed with the MD [place]. Reviewed the information documented and agree with the treatment plan.  Treatment Plan Summary: Daily contact with patient to assess and evaluate symptoms and progress in treatment, Medication management and Plan see below  Will continue today 04/21/17  plan as below except where it is noted.  Continue Prozac 20 mg po daily for affective sx Continue Risperidone 0.5 mg po daily and 1 mg po qhs for psychosis. continue Trazodone 100 mg po qhs for sleep/insomina    Restarted home medications as per DC instructions from Hospitalist Will repeat BMP tomorrow AM. CSW will continue to work on disposition.   Derrill Center, NP 04/21/2017, 11:59 AM  Agree with NP Progress Note

## 2017-04-22 ENCOUNTER — Encounter (HOSPITAL_COMMUNITY): Payer: Self-pay | Admitting: Psychiatry

## 2017-04-22 LAB — GLUCOSE, CAPILLARY: Glucose-Capillary: 121 mg/dL — ABNORMAL HIGH (ref 65–99)

## 2017-04-22 MED ORDER — ACETAMINOPHEN 325 MG PO TABS: 650.0000 mg | ORAL_TABLET | Freq: Four times a day (QID) | ORAL | Status: DC | PRN

## 2017-04-22 MED ORDER — METFORMIN HCL 1000 MG PO TABS: 1000.0000 mg | ORAL_TABLET | Freq: Every day | ORAL | Status: DC

## 2017-04-22 MED ORDER — NITROGLYCERIN 0.4 MG SL SUBL: 0.4000 mg | SUBLINGUAL_TABLET | SUBLINGUAL | 12 refills | Status: DC | PRN

## 2017-04-22 MED ORDER — PANTOPRAZOLE SODIUM 40 MG PO TBEC: 40.0000 mg | DELAYED_RELEASE_TABLET | Freq: Every day | ORAL | 0 refills | Status: DC

## 2017-04-22 MED ORDER — GLIPIZIDE ER 10 MG PO TB24: 10.0000 mg | ORAL_TABLET | Freq: Every day | ORAL | Status: DC

## 2017-04-22 MED ORDER — LIDOCAINE 5 % EX PTCH: 1.0000 | MEDICATED_PATCH | CUTANEOUS | 0 refills | Status: DC

## 2017-04-22 MED ORDER — LORATADINE 10 MG PO TABS: 10.0000 mg | ORAL_TABLET | Freq: Every day | ORAL | Status: DC

## 2017-04-22 MED ORDER — SENNOSIDES-DOCUSATE SODIUM 8.6-50 MG PO TABS: 1.0000 | ORAL_TABLET | Freq: Two times a day (BID) | ORAL | Status: DC

## 2017-04-22 MED ORDER — HYDROXYZINE HCL 25 MG PO TABS: 25.0000 mg | ORAL_TABLET | Freq: Three times a day (TID) | ORAL | 0 refills | Status: DC | PRN

## 2017-04-22 MED ORDER — TRAZODONE HCL 100 MG PO TABS: 100.0000 mg | ORAL_TABLET | Freq: Every day | ORAL | 0 refills | Status: DC

## 2017-04-22 MED ORDER — POLYETHYLENE GLYCOL 3350 17 G PO PACK: 17.0000 g | PACK | Freq: Two times a day (BID) | ORAL | 0 refills | Status: DC

## 2017-04-22 MED ORDER — SACCHAROMYCES BOULARDII 250 MG PO CAPS: 250.0000 mg | ORAL_CAPSULE | Freq: Two times a day (BID) | ORAL | 0 refills | Status: DC

## 2017-04-22 MED ORDER — CHLORHEXIDINE GLUCONATE 0.12 % MT SOLN: 15.0000 mL | Freq: Two times a day (BID) | OROMUCOSAL | 0 refills | Status: DC

## 2017-04-22 MED ORDER — RISPERIDONE 0.5 MG PO TABS: ORAL_TABLET | ORAL | 0 refills | Status: DC

## 2017-04-22 MED ORDER — TRAMADOL HCL 50 MG PO TABS: 50.0000 mg | ORAL_TABLET | Freq: Four times a day (QID) | ORAL | 0 refills | Status: DC | PRN

## 2017-04-22 MED ORDER — ASPIRIN EC 81 MG PO TBEC: 81.0000 mg | DELAYED_RELEASE_TABLET | Freq: Every day | ORAL | Status: DC

## 2017-04-22 MED ORDER — FLUOXETINE HCL 20 MG PO CAPS: 20.0000 mg | ORAL_CAPSULE | Freq: Every day | ORAL | 0 refills | Status: DC

## 2017-04-22 MED ORDER — AMLODIPINE BESYLATE 10 MG PO TABS: 10.0000 mg | ORAL_TABLET | Freq: Every day | ORAL | 0 refills | Status: DC

## 2017-04-22 MED ORDER — TAMSULOSIN HCL 0.4 MG PO CAPS: 0.4000 mg | ORAL_CAPSULE | Freq: Every day | ORAL | 0 refills | Status: DC

## 2017-04-22 NOTE — Discharge Summary (Signed)
Physician Discharge Summary Note  Patient:  Travis [name] is an 56 y.o., male MRN:  [id number] DOB:  Nov 25, 1960 Patient phone:  [phone] (home)  Patient address:   [place] [address],  Total Time spent with patient: Greater than 30 minutes  Date of Admission:  04/18/2017 Date of Discharge: 04/22/17  Reason for Admission: Worsening symptoms of depression with psychosis.  Principal Problem: Major depressive disorder, recurrent episode, severe, with psychosis [facility])  Discharge Diagnoses: Patient Active Problem List   Diagnosis Date Noted  . Major depressive disorder, recurrent episode, severe, with psychosis ([place]) [F33.3] 04/19/2017  . Depression [F32.9] 04/16/2017  . AKI (acute kidney injury) ([place]) [N17.9]   . Lactic acid acidosis [E87.2]   . Left sided numbness [R20.0]   . Sepsis ([place]) [A41.9]   . Elevated lactic acid level [R79.89]   . Leukocytosis [D72.829]   . Volume depletion [E86.9]   . Hypotension [I95.9] 03/13/2017  . Chest pain with moderate risk for cardiac etiology [R07.9] 01/13/2014  . Dyspnea [R06.00] 01/13/2014  . Family history of coronary artery disease- (mother had MI 84) [Z82.49] 01/13/2014  . Tobacco chew use [Z72.0]   . Type 2 diabetes mellitus ([place]) [E11.9]   . Dyslipidemia [E78.5]   . Unspecified vitamin D deficiency [E55.9]   . 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 Esophageal reflux [K21.9] 09/28/2007  . DYSPHAGIA UNSPECIFIED [R13.10] 09/28/2007  . BLOOD IN STOOL, OCCULT [R19.5] 09/28/2007  . HERNIORRHAPHY, HX OF [Z98.89] 09/28/2007   Past Psychiatric History: See H&P  Past Medical History:  Past Medical History:  Diagnosis Date  . Dyslipidemia   . Fatigue    NAUSEA,BLOATING  . H. pylori infection 09/15/2006  . Hematochezia 08/30/2007  . History of nuclear stress test 04/03/2009   exercise myoview; normal pattern of perfusion; low risk scan   . Hypertension    treated  . NIDDM (non-insulin  dependent diabetes mellitus) 2011   type 2  . Schizoaffective disorder ([place])   . Tobacco chew use   . Unspecified vitamin D deficiency     Past Surgical History:  Procedure Laterality Date  . APPENDECTOMY    . HERNIA REPAIR     RIGHT  . KNEE SURGERY     BOTH  . TRANSTHORACIC ECHOCARDIOGRAM  04/03/2009   EF=>55%; trace TR, mild pulm regurg   Family History:  Family History  Problem Relation Age of Onset  . Stroke Father 33  . Pneumonia Father   . Heart failure Mother 19  . Coronary artery disease Mother        also MI  . Heart attack Mother   . Heart attack Maternal Uncle        died in 28s  . Cancer Unknown        x2; maternal side  . Diabetes Maternal Grandmother    Family Psychiatric  History: See H&P  Social History:  History  Alcohol Use No     History  Drug Use No    Social History   Social History  . Marital status: Married    Spouse name: N/A  . Number of children: 2  . Years of education: N/A   Occupational History  .  Unemployed   Social History Main Topics  . Smoking status: Never Smoker  . Smokeless tobacco: Current User    Types: Snuff, Chew     Comment: started chewing tobacco at age 30 years old-stopped chewing and dips snuff only  .  Alcohol use No  . Drug use: No  . Sexual activity: Not Currently   Other Topics Concern  . None   Social History Narrative  . None   Hospital Course: Travis [name] a 56 y.o.male, seen, chart reviewed for this face-to-face psychiatric consultation and evaluation of increased symptoms of depression with suicidal ideation and hallucinations. Patient was initially admitted to the behavioral health hospitalization on 08/21/2018and then found having elevated creatinine and hypokalemia required medical attention so he was discharged to [facility] for further treatment needed. Patient was rehydrated with IV fluids & an order for increased oral fluids at this time. Patient continued to endorse symptoms  of depression and suicidal ideation and meet criteria for inpatient psychiatric re-hospitalization when medically stable.   After re-evaluation of his presenting symptoms, the medication regimen targeting those symptoms were discussed & initiated with patient's consent. He was medicated & discharged on; Risperdal 0.5 mg & 1 mg respectively for mood control & Trazodone 100 mg for insomnia. He was enrolled & participated in the group counseling sessions being offered & held on this unit. He learned coping skills. Travis [name] presented other significant pre-existing health issues that required treatments. He was resumed & discharged on all his pertinent home medications for those health issues. He tolerated his treatment regimen without any adverse effects or reactions reported.  Travis [name] is seen today for discharge. He has normal anxiety about going home after being in the hospital for a number of days. He is not overwhelmed by this. He is looking forward to going home & taking his mental health medications. Not expressing any delusions today. No hallucination. Feels in control of himself. No passivity of thought. No passivity of will. No fantasy about suicide lately. No suicidal thoughts. No thoughts of violence. No craving for substances. Does not feel depressed. No evidence of mania.  The nursing staff reports that patient has been appropriate on the unit. Patient has been interacting well with peers. No behavioral issues. Patient has not voiced any suicidal thoughts. Patient has not been observed to be internally stimulated or preoccupied. Patient has been adherent to his treatment recommendations. Patient has been tolerating his medications well. No reported adverse effects or reactions.   Patient was discussed at the treatment team meeting this morning. The team members feel that patient is back to his baseline level of function. Team agrees with plan to discharge patient today to continue mental health  care on outpatient basis as noted below. Travis [name] was provided with all the necessary information needed to make this appointment without problems. He left [facility] with all personal belongings in no apparent distress. Transportation per wife.   Physical Findings: AIMS: Facial and Oral Movements Muscles of Facial Expression: None, normal Lips and Perioral Area: None, normal Jaw: None, normal Tongue: None, normal,Extremity Movements Upper (arms, wrists, hands, fingers): None, normal Lower (legs, knees, ankles, toes): None, normal, Trunk Movements Neck, shoulders, hips: None, normal, Overall Severity Severity of abnormal movements (highest score from questions above): None, normal Incapacitation due to abnormal movements: None, normal Patient's awareness of abnormal movements (rate only patient's report): No Awareness, Dental Status Current problems with teeth and/or dentures?: No Does patient usually wear dentures?: No  CIWA:    COWS:     Musculoskeletal: Strength & Muscle Tone: within normal limits Gait & Station: normal Patient leans: Right  Psychiatric Specialty Exam: Physical Exam  Nursing note and vitals reviewed.   Review of Systems  Constitutional: Negative.   HENT: Negative.  Eyes: Negative.   Respiratory: Negative.   Cardiovascular: Negative.   Gastrointestinal: Negative.   Genitourinary: Negative.   Musculoskeletal: Negative.   Skin: Negative.   Neurological: Negative.   Endo/Heme/Allergies: Negative.   Psychiatric/Behavioral: Positive for depression (Stable), hallucinations ( Hx. psychosis) and substance abuse (Hx, Benzodiazepine use). Negative for memory loss and suicidal ideas. The patient is nervous/anxious and has insomnia (Stable).   All other systems reviewed and are negative.   Blood pressure 135/83, pulse (!) 118, temperature 98.9 F (37.2 C), temperature source Oral, resp. rate 20, height 5' 3.78" (1.62 m), weight 76.7 kg (169 lb).Body mass index is 29.21  kg/m.  See Md's SRA.   Have you used any form of tobacco in the last 30 days? (Cigarettes, Smokeless Tobacco, Cigars, and/or Pipes): Yes  Has this patient used any form of tobacco in the last 30 days? (Cigarettes, Smokeless Tobacco, Cigars, and/or Pipes): Yes, A prescription for an FDA-approved tobacco cessation medication was offered at discharge and the patient refused  Blood Alcohol level:  Lab Results  Component Value Date   [facility] <5 02/77/4128    Metabolic Disorder Labs:  Lab Results  Component Value Date   HGBA1C 6.3 (H) 04/16/2017   MPG 134.11 04/16/2017   No results found for: PROLACTIN Lab Results  Component Value Date   CHOL 152 04/16/2017   TRIG 209 (H) 04/16/2017   HDL 23 (L) 04/16/2017   CHOLHDL 6.6 04/16/2017   VLDL 42 (H) 04/16/2017   Nora 87 04/16/2017   See Psychiatric Specialty Exam and Suicide Risk Assessment completed by Attending Physician prior to discharge.  Discharge destination:  Home  Is patient on multiple antipsychotic therapies at discharge:  No    Has Patient had three or more failed trials of antipsychotic monotherapy by history:  No  Recommended Plan for Multiple Antipsychotic Therapies: NA  Allergies as of 04/22/2017      Reactions   Doxycycline Other (See Comments)   Reaction:  Blisters    Lisinopril Swelling      Medication List    STOP taking these medications   HYDROcodone-acetaminophen 5-325 MG tablet Commonly known as:  NORCO/VICODIN   indomethacin 25 MG capsule Commonly known as:  INDOCIN     TAKE these medications     Indication  acetaminophen 325 MG tablet Commonly known as:  TYLENOL Take 2 tablets (650 mg total) by mouth every 6 (six) hours as needed for mild pain.  Indication:  Fever, Pain   amLODipine 10 MG tablet Commonly known as:  NORVASC Take 1 tablet (10 mg total) by mouth daily. For high blood pressure What changed:  additional instructions  Indication:  High Blood Pressure Disorder   aspirin EC  81 MG tablet Take 1 tablet (81 mg total) by mouth daily. For heart health What changed:  additional instructions  Indication:  Heart health   chlorhexidine 0.12 % solution Commonly known as:  PERIDEX Use as directed 15 mLs in the mouth or throat 2 (two) times daily. For dental problems. What changed:  additional instructions  Indication:  Tooth Plaque, Gum Inflammation   FLUoxetine 20 MG capsule Commonly known as:  PROZAC Take 1 capsule (20 mg total) by mouth daily. For depression What changed:  additional instructions  Indication:  Depression   glipiZIDE 10 MG 24 hr tablet Commonly known as:  GLUCOTROL XL Take 1 tablet (10 mg total) by mouth daily. For diabetes management What changed:  additional instructions  Indication:  Type 2 Diabetes  hydrOXYzine 25 MG tablet Commonly known as:  ATARAX/VISTARIL Take 1 tablet (25 mg total) by mouth 3 (three) times daily as needed for anxiety.  Indication:  Feeling Anxious   lidocaine 5 % Commonly known as:  LIDODERM Place 1 patch onto the skin daily. Remove & Discard patch within 12 hours or as directed by MD: For pain management  Indication:  Pain management   loratadine 10 MG tablet Commonly known as:  CLARITIN Take 1 tablet (10 mg total) by mouth daily. For allergies What changed:  additional instructions  Indication:  Perennial Allergic Rhinitis, Hayfever   metFORMIN 1000 MG tablet Commonly known as:  GLUCOPHAGE Take 1 tablet (1,000 mg total) by mouth daily. For diabetes management What changed:  additional instructions  Indication:  Type 2 Diabetes   nitroGLYCERIN 0.4 MG SL tablet Commonly known as:  NITROSTAT Place 1 tablet (0.4 mg total) under the tongue every 5 (five) minutes as needed for chest pain.  Indication:  Acute Angina Pectoris   pantoprazole 40 MG tablet Commonly known as:  PROTONIX Take 1 tablet (40 mg total) by mouth daily. For acid reflux What changed:  additional instructions  Indication:   Gastroesophageal Reflux Disease   polyethylene glycol packet Commonly known as:  MIRALAX / GLYCOLAX Take 17 g by mouth 2 (two) times daily. For constipation What changed:  additional instructions  Indication:  Constipation   risperiDONE 0.5 MG tablet Commonly known as:  RISPERDAL Take 1 tablet (0.5 mg) in the mornings & 2 tablets (1 mg ) at bedtime: For mood control. What changed:  how much to take  how to take this  when to take this  additional instructions  Another medication with the same name was removed. Continue taking this medication, and follow the directions you see here.  Indication:  Mood control   saccharomyces boulardii 250 MG capsule Commonly known as:  FLORASTOR Take 1 capsule (250 mg total) by mouth 2 (two) times daily. Probiotic therapy What changed:  additional instructions  Indication:  Probiotic   senna-docusate 8.6-50 MG tablet Commonly known as:  Senokot-S Take 1 tablet by mouth 2 (two) times daily. For constipation What changed:  additional instructions  Indication:  Constipation   tamsulosin 0.4 MG Caps capsule Commonly known as:  FLOMAX Take 1 capsule (0.4 mg total) by mouth daily after breakfast. For prostate health What changed:  additional instructions  Indication:  Benign Enlargement of Prostate   traMADol 50 MG tablet Commonly known as:  ULTRAM Take 1 tablet (50 mg total) by mouth every 6 (six) hours as needed for moderate pain.  Indication:  Moderate to Moderately Severe Pain   traZODone 100 MG tablet Commonly known as:  DESYREL Take 1 tablet (100 mg total) by mouth at bedtime. For sleep What changed:  additional instructions  Indication:  Trouble Sleeping      Follow-up Information    [facility] Follow up on 04/25/2017.   Why:  Friday at 8:30 for your hospital follow up appointment.  Please bring along insurance card, ID and hospital d/c paperwork Contact information: [address] [place]   [phone]        Redmond School, MD. Go to.   Specialty:  Internal Medicine Contact information: [address] [place]  [address] [phone]          Follow-up recommendations: Activity:  As tolerated Diet: As recommended by your primary care doctor. Keep all scheduled follow-up appointments as recommended.  Comments: Patient is instructed prior  to discharge to: Take all medications as prescribed by his/her mental healthcare provider. Report any adverse effects and or reactions from the medicines to his/her outpatient provider promptly. Patient has been instructed & cautioned: To not engage in alcohol and or illegal drug use while on prescription medicines. In the event of worsening symptoms, patient is instructed to call the crisis hotline, 911 and or go to the nearest ED for appropriate evaluation and treatment of symptoms. To follow-up with his/her primary care provider for your other medical issues, concerns and or health care needs.   Signed: Encarnacion Slates, NP, PMHNP, FNP-BC. 04/22/2017, 10:31 AM

## 2017-04-22 NOTE — BHH Suicide Risk Assessment (Signed)
[facility] Discharge Suicide Risk Assessment   Principal Problem: Major depressive disorder, recurrent episode, severe, with psychosis ([place]) Discharge Diagnoses:  Patient Active Problem List   Diagnosis Date Noted  . AKI (acute kidney injury) ([place]) [N17.9]     Priority: High  . Type 2 diabetes mellitus ([facility]) [E11.9]     Priority: High  . Major depressive disorder, recurrent episode, severe, with psychosis ([place]) [F33.3] 04/19/2017  . Depression [F32.9] 04/16/2017  . Lactic acid acidosis [E87.2]   . Left sided numbness [R20.0]   . Sepsis ([place]) [A41.9]   . Elevated lactic acid level [R79.89]   . Leukocytosis [D72.829]   . Volume depletion [E86.9]   . Hypotension [I95.9] 03/13/2017  . Chest pain with moderate risk for cardiac etiology [R07.9] 01/13/2014  . Dyspnea [R06.00] 01/13/2014  . Family history of coronary artery disease- (mother had MI 52) [Z82.49] 01/13/2014  . Tobacco chew use [Z72.0]   . Dyslipidemia [E78.5]   . Unspecified vitamin D deficiency [E55.9]   . 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 Esophageal reflux [K21.9] 09/28/2007  . DYSPHAGIA UNSPECIFIED [R13.10] 09/28/2007  . BLOOD IN STOOL, OCCULT [R19.5] 09/28/2007  . HERNIORRHAPHY, HX OF [Z98.89] 09/28/2007    Total Time spent with patient: 30 minutes  Musculoskeletal: Strength & Muscle Tone: within normal limits Gait & Station: normal Patient leans: N/A  Psychiatric Specialty Exam: Review of Systems  Psychiatric/Behavioral: Negative for depression and suicidal ideas. The patient is not nervous/anxious.   All other systems reviewed and are negative.   Blood pressure 135/83, pulse (!) 118, temperature 98.9 F (37.2 C), temperature source Oral, resp. rate 20, height 5' 3.78" (1.62 m), weight 76.7 kg (169 lb).Body mass index is 29.21 kg/m.  General Appearance: Casual  Eye Contact::  Fair  Speech:  Clear and Coherent409  Volume:  Normal  Mood:  Euthymic  Affect:  Appropriate  Thought  Process:  Goal Directed and Descriptions of Associations: Intact  Orientation:  Full (Time, Place, and Person)  Thought Content:  Logical  Suicidal Thoughts:  No  Homicidal Thoughts:  No  Memory:  Immediate;   Fair Recent;   Fair Remote;   Fair  Judgement:  Fair  Insight:  Fair  Psychomotor Activity:  Normal  Concentration:  Fair  Recall:  [employer] of Knowledge:Fair  Language: Fair  Akathisia:  No  Handed:  Right  AIMS (if indicated):   0  Assets:  Communication Skills Desire for Improvement  Sleep:  Number of Hours: 6.75  Cognition: WNL  ADL's:  Intact   Mental Status Per Nursing Assessment::   On Admission:  Suicidal ideation indicated by patient, Suicide plan, Self-harm thoughts, Self-harm behaviors  Demographic Factors:  Male and Caucasian  Loss Factors: NA  Historical Factors: Impulsivity  Risk Reduction Factors:   Positive social support and Positive therapeutic relationship  Continued Clinical Symptoms:  Previous Psychiatric Diagnoses and Treatments Medical Diagnoses and Treatments/Surgeries  Cognitive Features That Contribute To Risk:  None    Suicide Risk:  Minimal: No identifiable suicidal ideation.  Patients presenting with no risk factors but with morbid ruminations; may be classified as minimal risk based on the severity of the depressive symptoms  Follow-up Information    [facility] Follow up on 04/25/2017.   Why:  Friday at 8:30 for your hospital follow up appointment.  Please bring along insurance card, ID and hospital d/c paperwork Contact information: [place] [address] [phone]  Redmond School, MD. Go to.   Specialty:  Internal Medicine Contact information: [address] [place] [address] [phone]           Plan Of Care/Follow-up recommendations:  Activity:  no restrictions Diet:  carb modified Tests:  follow up on your renal functions with your PMD Other:   NONE  Whitman Meinhardt, MD 04/22/2017, 9:42 AM

## 2017-04-22 NOTE — Progress Notes (Signed)
  [facility] Adult Case Management Discharge Plan :  Will you be returning to the same living situation after discharge:  Yes,  home At discharge, do you have transportation home?: Yes,  wife Do you have the ability to pay for your medications: Yes,  insurance  Release of information consent forms completed and in the chart;  Patient's signature needed at discharge.  Patient to Follow up at: Follow-up Information    [facility] Follow up on 04/25/2017.   Why:  Friday at 8:30 for your hospital follow up appointment.  Please bring along insurance card, ID and hospital d/c paperwork Contact information: [address] [place] [address] [phone]        Redmond School, MD. Go to.   Specialty:  Internal Medicine Contact information: [address] [place] [address] [phone]           Next level of care provider has access to [place] and Suicide Prevention discussed: Yes,  yes  Have you used any form of tobacco in the last 30 days? (Cigarettes, Smokeless Tobacco, Cigars, and/or Pipes): Yes  Has patient been referred to the Quitline?: Patient refused referral  Patient has been referred for addiction treatment: [place], LCSW 04/22/2017, 9:39 AM

## 2017-04-22 NOTE — Progress Notes (Signed)
Nursing Discharge Note 04/22/2017 [id number]  Data Reports sleeping good without PRN sleep med.  Rates depression 1/10, hopelessness 1/10, and anxiety 1/10. Affect flat.  Denies HI, SI, AVH.  C/O neck pain, MD notified, PRN tylenol given.  No bizarre statements or signs of paranoia this morning.  Reports feeling ready to discharge.  Received discharge orders.  Action Spoke with patient 1:1, nurse offered support to patient throughout shift.  Reviewed medications, discharge instructions, and follow up appointments with patient and patient's wife (with patient's verbal consent). Medication  scripts reviewed and given to patient.  Paperwork, AVS, SRA, and transition record handed to patient.   Escorted off of unit at 1310. Belongings returned per belongings form.  Discharged to lobby with wife.    Response Verbalized understanding of discharge teaching. Agrees to contact someone or 911 with thoughts/intent to harm self or others.    To follow up per AVS.

## 2017-04-22 NOTE — Progress Notes (Signed)
Recreation Therapy Notes  Date: 04/22/17 Time: 1000 Location: [address] Dayroom  Group Topic: Self-Esteem  Goal Area(s) Addresses:  Patient will successfully identify positive attributes about themselves.  Patient will successfully identify benefit of improved self-esteem.   Intervention: Markers, colored pencils, blank crest  Activity: Crest of Arms.  Patients were given a blank crest divided into four sections.  Patients were to highlight things that were important to them or that had special meaning to them such as biggest accomplishment, proudest moment, favorite activity, something I'm good at, etc.  Education:  Self-Esteem, Dentist.   Education Outcome: Acknowledges education/In group clarification offered/Needs additional education  Clinical Observations/Feedback: Pt did not attend group.    Victorino Sparrow, LRT/CTRS         Victorino Sparrow A 04/22/2017 11:41 AM

## 2017-05-09 DIAGNOSIS — Z79899 Other long term (current) drug therapy: Secondary | ICD-10-CM | POA: Diagnosis not present

## 2017-05-09 DIAGNOSIS — K219 Gastro-esophageal reflux disease without esophagitis: Secondary | ICD-10-CM | POA: Diagnosis not present

## 2017-05-09 DIAGNOSIS — Z1389 Encounter for screening for other disorder: Secondary | ICD-10-CM | POA: Diagnosis not present

## 2017-05-09 DIAGNOSIS — E119 Type 2 diabetes mellitus without complications: Secondary | ICD-10-CM | POA: Diagnosis not present

## 2017-05-09 DIAGNOSIS — M1991 Primary osteoarthritis, unspecified site: Secondary | ICD-10-CM | POA: Diagnosis not present

## 2017-05-13 DIAGNOSIS — M5481 Occipital neuralgia: Secondary | ICD-10-CM | POA: Diagnosis not present

## 2017-05-13 DIAGNOSIS — K219 Gastro-esophageal reflux disease without esophagitis: Secondary | ICD-10-CM | POA: Diagnosis not present

## 2017-05-31 DIAGNOSIS — M1711 Unilateral primary osteoarthritis, right knee: Secondary | ICD-10-CM | POA: Diagnosis not present

## 2017-05-31 DIAGNOSIS — M25562 Pain in left knee: Secondary | ICD-10-CM | POA: Diagnosis not present

## 2017-05-31 DIAGNOSIS — G8929 Other chronic pain: Secondary | ICD-10-CM | POA: Diagnosis not present

## 2017-05-31 DIAGNOSIS — M25561 Pain in right knee: Secondary | ICD-10-CM | POA: Diagnosis not present

## 2017-05-31 DIAGNOSIS — M542 Cervicalgia: Secondary | ICD-10-CM | POA: Diagnosis not present

## 2017-06-30 DIAGNOSIS — D72829 Elevated white blood cell count, unspecified: Secondary | ICD-10-CM | POA: Diagnosis not present

## 2017-08-29 DIAGNOSIS — K5909 Other constipation: Secondary | ICD-10-CM | POA: Diagnosis not present

## 2017-08-29 DIAGNOSIS — Z1389 Encounter for screening for other disorder: Secondary | ICD-10-CM | POA: Diagnosis not present

## 2017-08-29 DIAGNOSIS — E119 Type 2 diabetes mellitus without complications: Secondary | ICD-10-CM | POA: Diagnosis not present

## 2017-08-29 DIAGNOSIS — R413 Other amnesia: Secondary | ICD-10-CM | POA: Diagnosis not present

## 2017-08-29 DIAGNOSIS — D72829 Elevated white blood cell count, unspecified: Secondary | ICD-10-CM | POA: Diagnosis not present

## 2017-08-29 DIAGNOSIS — R109 Unspecified abdominal pain: Secondary | ICD-10-CM | POA: Diagnosis not present

## 2017-09-01 ENCOUNTER — Encounter: Payer: Self-pay | Admitting: Gastroenterology

## 2017-10-07 ENCOUNTER — Ambulatory Visit: Payer: Self-pay | Admitting: Gastroenterology

## 2017-10-27 ENCOUNTER — Ambulatory Visit: Payer: 59 | Admitting: Neurology

## 2017-10-27 ENCOUNTER — Encounter: Payer: Self-pay | Admitting: Neurology

## 2017-11-21 ENCOUNTER — Encounter: Payer: Self-pay | Admitting: *Deleted

## 2017-11-21 DIAGNOSIS — E119 Type 2 diabetes mellitus without complications: Secondary | ICD-10-CM | POA: Diagnosis not present

## 2017-11-21 DIAGNOSIS — Z1389 Encounter for screening for other disorder: Secondary | ICD-10-CM | POA: Diagnosis not present

## 2017-11-21 DIAGNOSIS — K121 Other forms of stomatitis: Secondary | ICD-10-CM | POA: Diagnosis not present

## 2017-11-26 ENCOUNTER — Ambulatory Visit: Payer: 59 | Admitting: Gastroenterology

## 2017-11-26 ENCOUNTER — Encounter: Payer: Self-pay | Admitting: Gastroenterology

## 2017-11-26 VITALS — BP 130/88 | HR 76 | Ht 66.0 in | Wt 191.0 lb

## 2017-11-26 DIAGNOSIS — R109 Unspecified abdominal pain: Secondary | ICD-10-CM

## 2017-11-26 DIAGNOSIS — K219 Gastro-esophageal reflux disease without esophagitis: Secondary | ICD-10-CM | POA: Diagnosis not present

## 2017-11-26 DIAGNOSIS — R1310 Dysphagia, unspecified: Secondary | ICD-10-CM

## 2017-11-26 DIAGNOSIS — R194 Change in bowel habit: Secondary | ICD-10-CM | POA: Diagnosis not present

## 2017-11-26 DIAGNOSIS — K5900 Constipation, unspecified: Secondary | ICD-10-CM

## 2017-11-26 DIAGNOSIS — F17200 Nicotine dependence, unspecified, uncomplicated: Secondary | ICD-10-CM | POA: Diagnosis not present

## 2017-11-26 DIAGNOSIS — R140 Abdominal distension (gaseous): Secondary | ICD-10-CM | POA: Diagnosis not present

## 2017-11-26 DIAGNOSIS — Z716 Tobacco abuse counseling: Secondary | ICD-10-CM

## 2017-11-26 DIAGNOSIS — R110 Nausea: Secondary | ICD-10-CM

## 2017-11-26 DIAGNOSIS — K625 Hemorrhage of anus and rectum: Secondary | ICD-10-CM

## 2017-11-26 MED ORDER — SUPREP BOWEL PREP KIT 17.5-3.13-1.6 GM/177ML PO SOLN: 1.0000 | ORAL | 0 refills | Status: DC

## 2017-11-26 NOTE — Progress Notes (Signed)
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 He has intermittent difficulty swallowing with both liquids and solid food, sometimes it hurts to even drink water.  He has felt food getting hung up in his throat, has to take multiple swallows of water to get it down. Heartburn somewhat controlled with  Protonix daily  He has generalized abdominal pain and abdominal bloating.  Occasional constipation and also diarrhea at times.  Patient also feels he has had dark stool on few occasions.  He is a poor historian, unable to obtain a good timeline on all his symptoms.  He has nausea on some days when he wakes up in the morning and also after meals.  No vomiting.  No occasional bright red blood per rectum and he strains excessively, usually small volume when he wipes on the toilet tissue paper. He has been chewing tobacco for many years, he started chewing at age 79.  Often he falls asleep with tobacco in the side of his mouth. Denies any loss of appetite or weight loss.    Outpatient Encounter Medications as of 11/26/2017  Medication Sig  . acetaminophen (TYLENOL) 325 MG tablet Take 2 tablets (650 mg total) by mouth every 6 (six) hours as needed for mild pain.  Marland Kitchen amLODipine (NORVASC) 10 MG tablet Take 1 tablet (10 mg total) by mouth daily. For high blood pressure  . aspirin EC 81 MG tablet Take 1 tablet (81 mg total) by mouth daily. For heart health  . chlorhexidine (PERIDEX) 0.12 % solution Use as directed 15 mLs in the mouth or throat 2 (two)  times daily. For dental problems.  Marland Kitchen FLUoxetine (PROZAC) 20 MG capsule Take 1 capsule (20 mg total) by mouth daily. For depression  . glipiZIDE (GLUCOTROL XL) 10 MG 24 hr tablet Take 1 tablet (10 mg total) by mouth daily. For diabetes management  . hydrOXYzine (ATARAX/VISTARIL) 25 MG tablet Take 1 tablet (25 mg total) by mouth 3 (three) times daily as needed for anxiety.  . indomethacin (INDOCIN) 25 MG capsule Take 1 capsule by mouth 2 (two) times daily.  Marland Kitchen loratadine (CLARITIN) 10 MG tablet Take 1 tablet (10 mg total) by mouth daily. For allergies  . metFORMIN (GLUCOPHAGE) 1000 MG tablet Take 1 tablet (1,000 mg total) by mouth daily. For diabetes management  . nitroGLYCERIN (NITROSTAT) 0.4 MG SL tablet Place 1 tablet (0.4 mg total) under the tongue every 5 (five) minutes as needed for chest pain.  . pantoprazole (PROTONIX) 40 MG tablet Take 1 tablet (40 mg total) by mouth daily. For acid reflux  . polyethylene glycol (MIRALAX / GLYCOLAX) packet Take 17 g by mouth 2 (two) times daily. For constipation  . risperiDONE (RISPERDAL) 0.5 MG tablet Take 1 tablet (0.5 mg) in the mornings & 2 tablets (1 mg ) at bedtime: For mood control.  . senna-docusate (SENOKOT-S) 8.6-50 MG tablet Take 1 tablet by mouth 2 (two) times daily. For constipation  . tamsulosin (FLOMAX) 0.4 MG CAPS capsule Take 1 capsule (0.4 mg total) by mouth daily after breakfast. For prostate health  .  traZODone (DESYREL) 100 MG tablet Take 1 tablet (100 mg total) by mouth at bedtime. For sleep  . lidocaine (LIDODERM) 5 % Place 1 patch onto the skin daily. Remove & Discard patch within 12 hours or as directed by MD: For pain management  . [DISCONTINUED] saccharomyces boulardii (FLORASTOR) 250 MG capsule Take 1 capsule (250 mg total) by mouth 2 (two) times daily. Probiotic therapy  . [DISCONTINUED] traMADol (ULTRAM) 50 MG tablet Take 1 tablet (50 mg total) by mouth every 6 (six) hours as needed for moderate pain.   No facility-administered  encounter medications on file as of 11/26/2017.     Allergies as of 11/26/2017 - Review Complete 11/26/2017  Allergen Reaction Noted  . Doxycycline Other (See Comments) 10/14/2014  . Lisinopril Swelling 09/07/2011    Past Medical History:  Diagnosis Date  . Dyslipidemia   . Fatigue    NAUSEA,BLOATING  . H. pylori infection 09/15/2006  . Hematochezia 08/30/2007  . History of nuclear stress test 04/03/2009   exercise myoview; normal pattern of perfusion; low risk scan   . Hypertension    treated  . NIDDM (non-insulin dependent diabetes mellitus) 2011   type 2  . Schizoaffective disorder ([place])   . Tobacco chew use   . Unspecified vitamin D deficiency     Past Surgical History:  Procedure Laterality Date  . APPENDECTOMY    . HERNIA REPAIR     RIGHT  . KNEE SURGERY     BOTH  . TRANSTHORACIC ECHOCARDIOGRAM  04/03/2009   EF=>55%; trace TR, mild pulm regurg    Family History  Problem Relation Age of Onset  . Stroke Father 33  . Pneumonia Father   . Heart failure Mother 59  . Coronary artery disease Mother        also MI  . Heart attack Mother   . Heart attack Maternal Uncle        died in 78s  . Cancer Unknown        x2; maternal side  . Diabetes Maternal Grandmother     Social History   Socioeconomic History  . Marital status: Married    Spouse name: Not on file  . Number of children: 2  . Years of education: Not on file  . Highest education level: Not on file  Occupational History    Employer: UNEMPLOYED  Social Needs  . Financial resource strain: Not on file  . Food insecurity:    Worry: Not on file    Inability: Not on file  . Transportation needs:    Medical: Not on file    Non-medical: Not on file  Tobacco Use  . Smoking status: Never Smoker  . Smokeless tobacco: Current User    Types: Snuff, Chew  . Tobacco comment: started chewing tobacco at age 75 years old-stopped chewing and dips snuff only  Substance and Sexual Activity  . Alcohol use: No     Alcohol/week: 0.0 oz  . Drug use: No  . Sexual activity: Not Currently  Lifestyle  . Physical activity:    Days per week: Not on file    Minutes per session: Not on file  . Stress: Not on file  Relationships  . Social connections:    Talks on phone: Not on file    Gets together: Not on file    Attends religious service: Not on file    Active member of club or organization: Not on file    Attends meetings of clubs or organizations:  Not on file    Relationship status: Not on file  . Intimate partner violence:    Fear of current or ex partner: Not on file    Emotionally abused: Not on file    Physically abused: Not on file    Forced sexual activity: Not on file  Other Topics Concern  . Not on file  Social History Narrative  . Not on file      Review of systems: Review of Systems  Constitutional: Negative for fever and chills.  HENT: Negative.   Eyes: Negative for blurred vision.  Respiratory: Negative for cough, shortness of breath and wheezing.   Cardiovascular: Negative for chest pain and palpitations.  Gastrointestinal: as per HPI Genitourinary: Negative for dysuria, urgency, frequency and hematuria.  Musculoskeletal: Negative for myalgias, back pain and joint pain.  Skin: Negative for itching and rash.  Neurological: Negative for dizziness, tremors, focal weakness, seizures and loss of consciousness.  Endo/Heme/Allergies: Positive for seasonal allergies.  Psychiatric/Behavioral: Negative for depression, suicidal ideas and hallucinations.  All other systems reviewed and are negative.   Physical Exam: Vitals:   11/26/17 0826  BP: 130/88  Pulse: 76   Body mass index is 30.83 kg/m. Gen:      No acute distress HEENT:  EOMI, sclera anicteric Neck:     No masses; no thyromegaly Lungs:    Clear to auscultation bilaterally; normal respiratory effort CV:         Regular rate and rhythm; no murmurs Abd:      + bowel sounds; soft, non-tender; no palpable masses, no  distension Ext:    No edema; adequate peripheral perfusion Skin:      Warm and dry; no rash Neuro: alert and oriented x 3 Psych: normal mood and affect  Data Reviewed:  Reviewed labs, radiology imaging, old records and pertinent past GI work up CT abdomen and pelvis March 13, 2017 1. Coronary arteriosclerosis along the circumflex. 2. Minimal aortic atherosclerosis. 3. Hepatic steatosis. 4. Small bilateral renal cysts, some are too small to further characterize. Nonobstructing right-sided punctate renal calculus. 5. 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  Assessment and Plan/Recommendations:  57 year old male with history of depression, chronic tobacco use, chronic GERD here with complaints of intermittent dysphagia, odynophagia, generalized abdominal pain, nausea, bloating and change in bowel habits with intermittent bright red blood per rectum We will schedule for EGD and colonoscopy for evaluation The risks and benefits as well as alternatives of endoscopic procedure(s) have been discussed and reviewed. All questions answered. The patient agrees to proceed. Discussed in detail with patient about cessation of chewing tobacco Continue Protonix daily Antireflux measures and lifestyle modifications were also discussed with patient in detail  K. Denzil Magnuson , MD [phone]    CC: Redmond School, MD

## 2017-11-26 NOTE — Patient Instructions (Signed)
Normal BMI (Body Mass Index- based on height and weight) is between 19 and 25. Your BMI today is Body mass index is 30.83 kg/m. Marland Kitchen Please consider follow up  regarding your BMI with your Primary Care Provider.  You have been scheduled for a colonoscopy/EGD. Please follow written instructions given to you at your visit today.  Please pick up your prep supplies at the pharmacy within the next 1-3 days. If you use inhalers (even only as needed), please bring them with you on the day of your procedure. Your physician has requested that you go to www.startemmi.com and enter the access code given to you at your visit today. This web site gives a general overview about your procedure. However, you should still follow specific instructions given to you by our office regarding your preparation for the procedure.   Please quit chewing Tobacco

## 2017-12-04 ENCOUNTER — Encounter: Payer: Self-pay | Admitting: Gastroenterology

## 2017-12-18 ENCOUNTER — Encounter: Payer: Self-pay | Admitting: Gastroenterology

## 2017-12-18 ENCOUNTER — Other Ambulatory Visit: Payer: Self-pay

## 2017-12-18 ENCOUNTER — Ambulatory Visit (AMBULATORY_SURGERY_CENTER): Payer: 59 | Admitting: Gastroenterology

## 2017-12-18 VITALS — BP 122/75 | HR 79 | Temp 98.6°F | Resp 14 | Ht 66.0 in | Wt 191.0 lb

## 2017-12-18 DIAGNOSIS — D123 Benign neoplasm of transverse colon: Secondary | ICD-10-CM | POA: Diagnosis not present

## 2017-12-18 DIAGNOSIS — K921 Melena: Secondary | ICD-10-CM | POA: Diagnosis not present

## 2017-12-18 DIAGNOSIS — K625 Hemorrhage of anus and rectum: Secondary | ICD-10-CM

## 2017-12-18 DIAGNOSIS — K208 Other esophagitis: Secondary | ICD-10-CM | POA: Diagnosis not present

## 2017-12-18 DIAGNOSIS — R109 Unspecified abdominal pain: Secondary | ICD-10-CM

## 2017-12-18 DIAGNOSIS — R1310 Dysphagia, unspecified: Secondary | ICD-10-CM

## 2017-12-18 DIAGNOSIS — K219 Gastro-esophageal reflux disease without esophagitis: Secondary | ICD-10-CM | POA: Diagnosis not present

## 2017-12-18 DIAGNOSIS — R1319 Other dysphagia: Secondary | ICD-10-CM

## 2017-12-18 MED ORDER — SODIUM CHLORIDE 0.9 % IV SOLN
500.0000 mL | Freq: Once | INTRAVENOUS | Status: DC
Start: 2017-12-18 — End: 2018-10-15

## 2017-12-18 NOTE — Progress Notes (Signed)
Report to PACU, RN, vss, BBS= Clear.  

## 2017-12-18 NOTE — Patient Instructions (Signed)
YOU HAD AN ENDOSCOPIC PROCEDURE TODAY AT [facility]:   Refer to the procedure report that was given to you for any specific questions about what was found during the examination.  If the procedure report does not answer your questions, please call your gastroenterologist to clarify.  If you requested that your care partner not be given the details of your procedure findings, then the procedure report has been included in a sealed envelope for you to review at your convenience later.  YOU SHOULD EXPECT: Some feelings of bloating in the abdomen. Passage of more gas than usual.  Walking can help get rid of the air that was put into your GI tract during the procedure and reduce the bloating. If you had a lower endoscopy (such as a colonoscopy or flexible sigmoidoscopy) you may notice spotting of blood in your stool or on the toilet paper. If you underwent a bowel prep for your procedure, you may not have a normal bowel movement for a few days.  Please Note:  You might notice some irritation and congestion in your nose or some drainage.  This is from the oxygen used during your procedure.  There is no need for concern and it should clear up in a day or so.  SYMPTOMS TO REPORT IMMEDIATELY:   Following lower endoscopy (colonoscopy or flexible sigmoidoscopy):  Excessive amounts of blood in the stool  Significant tenderness or worsening of abdominal pains  Swelling of the abdomen that is new, acute  Fever of 100F or higher   Following upper endoscopy (EGD)  Vomiting of blood or coffee ground material  New chest pain or pain under the shoulder blades  Painful or persistently difficult swallowing  New shortness of breath  Fever of 100F or higher  Black, tarry-looking stools  For urgent or emergent issues, a gastroenterologist can be reached at any hour by calling [phone].   DIET:  We do recommend a small meal at first, but then you may proceed to your regular diet.  Drink  plenty of fluids but you should avoid alcoholic beverages for 24 hours.  ACTIVITY:  You should plan to take it easy for the rest of today and you should NOT DRIVE or use heavy machinery until tomorrow (because of the sedation medicines used during the test).    FOLLOW UP: Our staff will call the number listed on your records the next business day following your procedure to check on you and address any questions or concerns that you may have regarding the information given to you following your procedure. If we do not reach you, we will leave a message.  However, if you are feeling well and you are not experiencing any problems, there is no need to return our call.  We will assume that you have returned to your regular daily activities without incident.  If any biopsies were taken you will be contacted by phone or by letter within the next 1-3 weeks.  Please call us at [phone] if you have not heard about the biopsies in 3 weeks.    SIGNATURES/CONFIDENTIALITY: You and/or your care partner have signed paperwork which will be entered into your electronic medical record.  These signatures attest to the fact that that the information above on your After Visit Summary has been reviewed and is understood.  Full responsibility of the confidentiality of this discharge information lies with you and/or your care-partner.  Polyps, diverticulosis and hemorrhoid information given.

## 2017-12-18 NOTE — Progress Notes (Signed)
Called to room to assist during endoscopic procedure.  Patient ID and intended procedure confirmed with present staff. Received instructions for my participation in the procedure from the performing physician.  

## 2017-12-18 NOTE — Op Note (Signed)
[facility] Patient Name: Travis [name] Procedure Date: 12/18/2017 1:31 PM MRN: [id number] Endoscopist: Mauri Pole , MD Age: 57 Referring MD:  Date of Birth: 02/06/1961 Gender: Male Account #: [id number] Procedure:                Upper GI endoscopy Indications:              Heartburn, Esophageal reflux Medicines:                Monitored Anesthesia Care Procedure:                Pre-Anesthesia Assessment:                           - Prior to the procedure, a History and Physical                            was performed, and patient medications and                            allergies were reviewed. The patient's tolerance of                            previous anesthesia was also reviewed. The risks                            and benefits of the procedure and the sedation                            options and risks were discussed with the patient.                            All questions were answered, and informed consent                            was obtained. Prior Anticoagulants: The patient has                            taken no previous anticoagulant or antiplatelet                            agents. ASA Grade Assessment: II - A patient with                            mild systemic disease. After reviewing the risks                            and benefits, the patient was deemed in                            satisfactory condition to undergo the procedure.                           After obtaining informed consent, the endoscope was  passed under direct vision. Throughout the                            procedure, the patient's blood pressure, pulse, and                            oxygen saturations were monitored continuously. The                            Endoscope was introduced through the mouth, and                            advanced to the second part of duodenum. The upper                            GI endoscopy was  accomplished without difficulty.                            The patient tolerated the procedure well. Scope In: Scope Out: Findings:                 The esophagus and gastroesophageal junction were                            examined with white light and narrow band imaging                            (NBI) from a forward view and retroflexed position.                            There were esophageal mucosal changes suspicious                            for short-segment Barrett's esophagus. These                            changes involved the mucosa along an irregular                            Z-line (35 cm from the incisors). One tongue of                            salmon-colored mucosa was present at 32 cm. The                            maximum longitudinal extent of these esophageal                            mucosal changes was 3 cm in length. Mucosa was                            biopsied with a cold forceps for histology in a  targeted manner at intervals of 1 cm in the lower                            third of the esophagus. One specimen bottle was                            sent to pathology.                           The stomach was normal.                           The examined duodenum was normal. Complications:            No immediate complications. Estimated Blood Loss:     Estimated blood loss was minimal. Impression:               - Esophageal mucosal changes suspicious for                            short-segment Barrett's esophagus. Biopsied.                           - Normal stomach.                           - Normal examined duodenum. Recommendation:           - Patient has a contact number available for                            emergencies. The signs and symptoms of potential                            delayed complications were discussed with the                            patient. Return to normal activities tomorrow.                             Written discharge instructions were provided to the                            patient.                           - Resume previous diet.                           - Continue present medications.                           - Await pathology results.                           - No aspirin, ibuprofen, naproxen, or other  non-steroidal anti-inflammatory drugs.                           - Repeat upper endoscopy after studies are complete                            for surveillance based on pathology results. Mauri Pole, MD 12/18/2017 2:12:51 PM This report has been signed electronically.

## 2017-12-18 NOTE — Op Note (Signed)
[facility] Patient Name: Travis [name] Procedure Date: 12/18/2017 1:31 PM MRN: [id number] Endoscopist: Mauri Pole , MD Age: 57 Referring MD:  Date of Birth: 1961-03-01 Gender: Male Account #: [id number] Procedure:                Colonoscopy Indications:              Evaluation of unexplained GI bleeding Medicines:                Monitored Anesthesia Care Procedure:                Pre-Anesthesia Assessment:                           - Prior to the procedure, a History and Physical                            was performed, and patient medications and                            allergies were reviewed. The patient's tolerance of                            previous anesthesia was also reviewed. The risks                            and benefits of the procedure and the sedation                            options and risks were discussed with the patient.                            All questions were answered, and informed consent                            was obtained. Prior Anticoagulants: The patient has                            taken no previous anticoagulant or antiplatelet                            agents. ASA Grade Assessment: II - A patient with                            mild systemic disease. After reviewing the risks                            and benefits, the patient was deemed in                            satisfactory condition to undergo the procedure.                           After obtaining informed consent, the colonoscope  was passed under direct vision. Throughout the                            procedure, the patient's blood pressure, pulse, and                            oxygen saturations were monitored continuously. The                            Colonoscope was introduced through the anus and                            advanced to the the cecum, identified by                            appendiceal orifice and  ileocecal valve. The                            colonoscopy was performed without difficulty. The                            patient tolerated the procedure well. The quality                            of the bowel preparation was excellent. The                            ileocecal valve, appendiceal orifice, and rectum                            were photographed. Scope In: 1:49:10 PM Scope Out: 2:01:17 PM Scope Withdrawal Time: 0 hours 9 minutes 37 seconds  Total Procedure Duration: 0 hours 12 minutes 7 seconds  Findings:                 The perianal and digital rectal examinations were                            normal.                           A 14 mm polyp was found in the transverse colon.                            The polyp was multi-lobulated, ulcerated and                            pedunculated. The polyp was removed with a hot                            snare. Resection and retrieval were complete.                           A 10 mm polyp was found in the transverse colon.  The polyp was pedunculated. The polyp was removed                            with a hot snare. Resection and retrieval were                            complete.                           A few small-mouthed diverticula were found in the                            sigmoid colon and descending colon.                           Non-bleeding internal hemorrhoids were found during                            retroflexion. The hemorrhoids were small. Complications:            No immediate complications. Estimated Blood Loss:     Estimated blood loss was minimal. Impression:               - One 14 mm polyp in the transverse colon, removed                            with a hot snare. Resected and retrieved.                           - One 10 mm polyp in the transverse colon, removed                            with a hot snare. Resected and retrieved.                           -  Diverticulosis in the sigmoid colon and in the                            descending colon.                           - Non-bleeding internal hemorrhoids. Recommendation:           - Patient has a contact number available for                            emergencies. The signs and symptoms of potential                            delayed complications were discussed with the                            patient. Return to normal activities tomorrow.                            Written discharge  instructions were provided to the                            patient.                           - Resume previous diet.                           - Continue present medications.                           - Await pathology results.                           - Repeat colonoscopy in 3 years for surveillance                            based on pathology results. Mauri Pole, MD 12/18/2017 2:08:35 PM This report has been signed electronically.

## 2017-12-19 ENCOUNTER — Telehealth: Payer: Self-pay | Admitting: *Deleted

## 2017-12-19 NOTE — Telephone Encounter (Signed)
  Follow up Call-  Call back number 12/18/2017  Post procedure Call Back phone  # [phone]  Permission to leave phone message Yes  Some recent data might be hidden    [facility]

## 2017-12-25 ENCOUNTER — Encounter: Payer: Self-pay | Admitting: Gastroenterology

## 2018-01-10 ENCOUNTER — Other Ambulatory Visit: Payer: Self-pay | Admitting: Cardiovascular Disease

## 2018-02-07 DIAGNOSIS — M1712 Unilateral primary osteoarthritis, left knee: Secondary | ICD-10-CM | POA: Diagnosis not present

## 2018-02-07 DIAGNOSIS — M1711 Unilateral primary osteoarthritis, right knee: Secondary | ICD-10-CM | POA: Diagnosis not present

## 2018-05-22 DIAGNOSIS — Z6830 Body mass index (BMI) 30.0-30.9, adult: Secondary | ICD-10-CM | POA: Diagnosis not present

## 2018-05-22 DIAGNOSIS — E1165 Type 2 diabetes mellitus with hyperglycemia: Secondary | ICD-10-CM | POA: Diagnosis not present

## 2018-05-22 DIAGNOSIS — Z1389 Encounter for screening for other disorder: Secondary | ICD-10-CM | POA: Diagnosis not present

## 2018-05-22 DIAGNOSIS — E6609 Other obesity due to excess calories: Secondary | ICD-10-CM | POA: Diagnosis not present

## 2018-05-25 DIAGNOSIS — E1165 Type 2 diabetes mellitus with hyperglycemia: Secondary | ICD-10-CM | POA: Diagnosis not present

## 2018-06-19 DIAGNOSIS — R400 Somnolence: Secondary | ICD-10-CM | POA: Diagnosis not present

## 2018-06-19 DIAGNOSIS — G4730 Sleep apnea, unspecified: Secondary | ICD-10-CM | POA: Diagnosis not present

## 2018-06-30 DIAGNOSIS — Z6831 Body mass index (BMI) 31.0-31.9, adult: Secondary | ICD-10-CM | POA: Diagnosis not present

## 2018-06-30 DIAGNOSIS — K529 Noninfective gastroenteritis and colitis, unspecified: Secondary | ICD-10-CM | POA: Diagnosis not present

## 2018-06-30 DIAGNOSIS — E6609 Other obesity due to excess calories: Secondary | ICD-10-CM | POA: Diagnosis not present

## 2018-07-08 ENCOUNTER — Ambulatory Visit (HOSPITAL_COMMUNITY)
Admission: RE | Admit: 2018-07-08 | Discharge: 2018-07-08 | Disposition: A | Discharge status: Home / Self Care | Payer: 59 | Source: Ambulatory Visit | Attending: Internal Medicine | Admitting: Internal Medicine

## 2018-07-08 ENCOUNTER — Other Ambulatory Visit (HOSPITAL_COMMUNITY): Payer: Self-pay | Admitting: Internal Medicine

## 2018-07-08 DIAGNOSIS — R109 Unspecified abdominal pain: Secondary | ICD-10-CM | POA: Diagnosis not present

## 2018-07-08 DIAGNOSIS — K5900 Constipation, unspecified: Secondary | ICD-10-CM | POA: Diagnosis not present

## 2018-07-08 DIAGNOSIS — R1013 Epigastric pain: Secondary | ICD-10-CM | POA: Diagnosis not present

## 2018-07-08 DIAGNOSIS — E6609 Other obesity due to excess calories: Secondary | ICD-10-CM | POA: Diagnosis not present

## 2018-07-09 DIAGNOSIS — G4733 Obstructive sleep apnea (adult) (pediatric): Secondary | ICD-10-CM | POA: Diagnosis not present

## 2018-07-24 DIAGNOSIS — G4733 Obstructive sleep apnea (adult) (pediatric): Secondary | ICD-10-CM | POA: Diagnosis not present

## 2018-08-06 ENCOUNTER — Emergency Department (HOSPITAL_COMMUNITY)
Admission: EM | Admit: 2018-08-06 | Discharge: 2018-08-06 | Disposition: A | Discharge status: Home / Self Care | Payer: 59 | Attending: Emergency Medicine | Admitting: Emergency Medicine

## 2018-08-06 ENCOUNTER — Encounter (HOSPITAL_COMMUNITY): Payer: Self-pay | Admitting: Emergency Medicine

## 2018-08-06 ENCOUNTER — Emergency Department (HOSPITAL_COMMUNITY): Payer: 59

## 2018-08-06 ENCOUNTER — Other Ambulatory Visit: Payer: Self-pay

## 2018-08-06 DIAGNOSIS — R319 Hematuria, unspecified: Secondary | ICD-10-CM | POA: Diagnosis not present

## 2018-08-06 DIAGNOSIS — F17220 Nicotine dependence, chewing tobacco, uncomplicated: Secondary | ICD-10-CM | POA: Diagnosis not present

## 2018-08-06 DIAGNOSIS — Z87448 Personal history of other diseases of urinary system: Secondary | ICD-10-CM

## 2018-08-06 DIAGNOSIS — I10 Essential (primary) hypertension: Secondary | ICD-10-CM | POA: Insufficient documentation

## 2018-08-06 DIAGNOSIS — Z87898 Personal history of other specified conditions: Secondary | ICD-10-CM

## 2018-08-06 DIAGNOSIS — E119 Type 2 diabetes mellitus without complications: Secondary | ICD-10-CM | POA: Diagnosis not present

## 2018-08-06 DIAGNOSIS — Z79899 Other long term (current) drug therapy: Secondary | ICD-10-CM | POA: Diagnosis not present

## 2018-08-06 DIAGNOSIS — R310 Gross hematuria: Secondary | ICD-10-CM | POA: Diagnosis not present

## 2018-08-06 DIAGNOSIS — Z7982 Long term (current) use of aspirin: Secondary | ICD-10-CM | POA: Diagnosis not present

## 2018-08-06 DIAGNOSIS — Z7984 Long term (current) use of oral hypoglycemic drugs: Secondary | ICD-10-CM | POA: Diagnosis not present

## 2018-08-06 DIAGNOSIS — R109 Unspecified abdominal pain: Secondary | ICD-10-CM | POA: Diagnosis not present

## 2018-08-06 LAB — URINALYSIS, ROUTINE W REFLEX MICROSCOPIC
Bilirubin Urine: NEGATIVE
Glucose, UA: 150 mg/dL — AB
Hgb urine dipstick: NEGATIVE
Ketones, ur: NEGATIVE mg/dL
Leukocytes, UA: NEGATIVE
Nitrite: NEGATIVE
Protein, ur: NEGATIVE mg/dL
Specific Gravity, Urine: 1.023 (ref 1.005–1.030)
pH: 5 (ref 5.0–8.0)

## 2018-08-06 MED ORDER — CEPHALEXIN 500 MG PO CAPS: 500.0000 mg | ORAL_CAPSULE | Freq: Two times a day (BID) | ORAL | 0 refills | Status: DC

## 2018-08-06 MED ORDER — CEPHALEXIN 500 MG PO CAPS
500.0000 mg | ORAL_CAPSULE | Freq: Once | ORAL | Status: AC
  Administered 2018-08-06: 500 mg via ORAL
  Filled 2018-08-06: qty 1

## 2018-08-06 NOTE — ED Triage Notes (Signed)
Patient reports hematuria for the past couple of weeks. He was at work today when he had a sharp stabbing pain in his abdominal right lower quadrant. No OTC medications taken for pain today. Denies NVD. PMH of kidney stones.

## 2018-08-06 NOTE — Discharge Instructions (Addendum)
As discussed, your urinalysis tonight does not show any blood in your urine, but you are being treated for a possible cystitis (bladder infection) with the antibiotic prescribed.  You are not currently passing a kidney stone. Plan to see your doctor next week for a check of your symptoms as discussed, but sooner for any worsened symptoms including increased pain, fevers, increased passage of blood or any new symptoms.

## 2018-08-07 NOTE — ED Provider Notes (Signed)
[facility] EMERGENCY DEPARTMENT Provider Note   CSN: [id number] Arrival date & time: 08/06/18  1833     History   Chief Complaint Chief Complaint  Patient presents with  . Hematuria    HPI Travis [name] is a 56 y.o. male with a history as outlined below and most significant for DM, HTN, BPH and kidney stones presenting with a several week history of seeing intermittent blood in his urine.  He describes seeing a small amount of blood at the beginning of his urine stream, but denies increased urinary frequency, urgency, bladder fullness, difficulty passing urine or back pain.  He had an episode of sharp stabbing pain in his right lower abdomen at work today which reminds him of pain associated with kidney stones but never had pain in the flank.  The pain lasted for several minutes then resolved but was very intense.  He denies fevers,chills, nausea, vomiting, denies penile pain or discharge, no scrotal pain or swelling.  The history is provided by the patient and the spouse.    Past Medical History:  Diagnosis Date  . Dyslipidemia   . Fatigue    NAUSEA,BLOATING  . H. pylori infection 09/15/2006  . Hematochezia 08/30/2007  . History of nuclear stress test 04/03/2009   exercise myoview; normal pattern of perfusion; low risk scan   . Hypertension    treated  . NIDDM (non-insulin dependent diabetes mellitus) 2011   type 2  . Schizoaffective disorder ([place])   . Tobacco chew use   . Unspecified vitamin D deficiency     Patient Active Problem List   Diagnosis Date Noted  . Major depressive disorder, recurrent episode, severe, with psychosis ([place]) 04/19/2017  . Depression 04/16/2017  . AKI (acute kidney injury) ([place])   . Lactic acid acidosis   . Left sided numbness   . Sepsis ([place])   . Elevated lactic acid level   . Leukocytosis   . Volume depletion   . Hypotension 03/13/2017  . Chest pain with moderate risk for cardiac etiology 01/13/2014  . Dyspnea 01/13/2014  . Family  history of coronary artery disease- (mother had MI 61) 01/13/2014  . Tobacco chew use   . Type 2 diabetes mellitus ([place])   . Dyslipidemia   . Unspecified vitamin D deficiency   . 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 Esophageal reflux 09/28/2007  . DYSPHAGIA UNSPECIFIED 09/28/2007  . BLOOD IN STOOL, OCCULT 09/28/2007  . HERNIORRHAPHY, HX OF 09/28/2007    Past Surgical History:  Procedure Laterality Date  . APPENDECTOMY    . HERNIA REPAIR     RIGHT  . KNEE SURGERY     BOTH  . TRANSTHORACIC ECHOCARDIOGRAM  04/03/2009   EF=>55%; trace TR, mild pulm regurg        Home Medications    Prior to Admission medications   Medication Sig Start Date End Date Taking? Authorizing Provider  acetaminophen (TYLENOL) 325 MG tablet Take 2 tablets (650 mg total) by mouth every 6 (six) hours as needed for mild pain. 04/22/17   Lindell Spar I, NP  amLODipine (NORVASC) 10 MG tablet Take 1 tablet (10 mg total) by mouth daily. For high blood pressure 04/23/17   Lindell Spar I, NP  aspirin EC 81 MG tablet Take 1 tablet (81 mg total) by mouth daily. For heart health 04/22/17   Lindell Spar I, NP  atorvastatin (LIPITOR) 20 MG tablet Take 20 mg by mouth daily. 11/23/17   [provider]  cephALEXin (  KEFLEX) 500 MG capsule Take 1 capsule (500 mg total) by mouth 2 (two) times daily. 08/06/18   Evalee Jefferson, PA-C  chlorhexidine (PERIDEX) 0.12 % solution Use as directed 15 mLs in the mouth or throat 2 (two) times daily. For dental problems. Patient not taking: Reported on 12/18/2017 04/22/17   Lindell Spar I, NP  FLUoxetine (PROZAC) 20 MG capsule Take 1 capsule (20 mg total) by mouth daily. For depression 04/23/17   Lindell Spar I, NP  glipiZIDE (GLUCOTROL XL) 10 MG 24 hr tablet Take 1 tablet (10 mg total) by mouth daily. For diabetes management 04/22/17   Lindell Spar I, NP  hydrOXYzine (ATARAX/VISTARIL) 25 MG tablet Take 1 tablet (25 mg total) by mouth 3 (three) times daily as needed for  anxiety. 04/22/17   Lindell Spar I, NP  indomethacin (INDOCIN) 25 MG capsule Take 1 capsule by mouth 2 (two) times daily. 11/11/17   [provider]  lidocaine (LIDODERM) 5 % Place 1 patch onto the skin daily. Remove & Discard patch within 12 hours or as directed by MD: For pain management Patient not taking: Reported on 12/18/2017 04/23/17   Lindell Spar I, NP  loratadine (CLARITIN) 10 MG tablet Take 1 tablet (10 mg total) by mouth daily. For allergies 04/22/17   Lindell Spar I, NP  metFORMIN (GLUCOPHAGE) 1000 MG tablet Take 1 tablet (1,000 mg total) by mouth daily. For diabetes management 04/22/17   Lindell Spar I, NP  nitroGLYCERIN (NITROSTAT) 0.4 MG SL tablet Place 1 tablet (0.4 mg total) under the tongue every 5 (five) minutes as needed for chest pain. Patient not taking: Reported on 12/18/2017 04/22/17   Lindell Spar I, NP  pantoprazole (PROTONIX) 40 MG tablet Take 1 tablet (40 mg total) by mouth daily. For acid reflux 04/22/17   Lindell Spar I, NP  polyethylene glycol (MIRALAX / GLYCOLAX) packet Take 17 g by mouth 2 (two) times daily. For constipation 04/22/17   Lindell Spar I, NP  risperiDONE (RISPERDAL) 0.25 MG tablet Take 0.25 mg by mouth daily. 11/21/17   [provider]  senna-docusate (SENOKOT-S) 8.6-50 MG tablet Take 1 tablet by mouth 2 (two) times daily. For constipation 04/22/17   Lindell Spar I, NP  tamsulosin (FLOMAX) 0.4 MG CAPS capsule Take 1 capsule (0.4 mg total) by mouth daily after breakfast. For prostate health 04/22/17   Lindell Spar I, NP  traZODone (DESYREL) 100 MG tablet Take 1 tablet (100 mg total) by mouth at bedtime. For sleep 04/22/17   Encarnacion Slates, NP    Family History Family History  Problem Relation Age of Onset  . Stroke Father 29  . Pneumonia Father   . Heart failure Mother 43  . Coronary artery disease Mother        also MI  . Heart attack Mother   . Heart attack Maternal Uncle        died in 83s  . Cancer Other        x2; maternal side  .  Diabetes Maternal Grandmother     Social History Social History   Tobacco Use  . Smoking status: Never Smoker  . Smokeless tobacco: Current User    Types: Snuff, Chew  . Tobacco comment: started chewing tobacco at age 67 years old-stopped chewing and dips snuff only  Substance Use Topics  . Alcohol use: No    Alcohol/week: 0.0 standard drinks  . Drug use: No     Allergies   Doxycycline and Lisinopril   Review  of Systems Review of Systems  Constitutional: Negative for chills and fever.  HENT: Negative for congestion and sore throat.   Eyes: Negative.   Respiratory: Negative for chest tightness and shortness of breath.   Cardiovascular: Negative for chest pain.  Gastrointestinal: Positive for abdominal pain. Negative for nausea and vomiting.  Genitourinary: Positive for hematuria. Negative for decreased urine volume, discharge, flank pain, frequency, penile pain, scrotal swelling, testicular pain and urgency.  Musculoskeletal: Negative for arthralgias, joint swelling and neck pain.  Skin: Negative.  Negative for rash and wound.  Neurological: Negative for dizziness, weakness, light-headedness, numbness and headaches.  Psychiatric/Behavioral: Negative.      Physical Exam Updated Vital Signs BP 140/81 (BP Location: Right Arm)   Pulse 93   Temp 98 F (36.7 C) (Oral)   Resp 18   Ht 5\' 5"  (1.651 m)   Wt 84.8 kg   SpO2 98%   BMI 31.12 kg/m   Physical Exam Vitals signs and nursing note reviewed.  Constitutional:      Appearance: He is well-developed.  HENT:     Head: Normocephalic and atraumatic.  Eyes:     Conjunctiva/sclera: Conjunctivae normal.  Neck:     Musculoskeletal: Normal range of motion.  Cardiovascular:     Rate and Rhythm: Normal rate and regular rhythm.     Heart sounds: Normal heart sounds.  Pulmonary:     Effort: Pulmonary effort is normal.     Breath sounds: Normal breath sounds. No wheezing.  Abdominal:     General: Bowel sounds are normal.  There is no distension.     Palpations: Abdomen is soft. There is no mass.     Tenderness: There is no abdominal tenderness. There is no right CVA tenderness, left CVA tenderness or guarding.  Musculoskeletal: Normal range of motion.  Skin:    General: Skin is warm and dry.  Neurological:     Mental Status: He is alert.      ED Treatments / Results  Labs (all labs ordered are listed, but only abnormal results are displayed) Labs Reviewed  URINALYSIS, ROUTINE W REFLEX MICROSCOPIC - Abnormal; Notable for the following components:      Result Value   Glucose, UA 150 (*)    All other components within normal limits    EKG None  Radiology Ct Renal Stone Study  Result Date: 08/06/2018 CLINICAL DATA:  Right lower quadrant abdominal pain and hematuria. EXAM: CT ABDOMEN AND PELVIS WITHOUT CONTRAST TECHNIQUE: Multidetector CT imaging of the abdomen and pelvis was performed following the standard protocol without IV contrast. COMPARISON:  CT abdomen pelvis 03/13/2017 FINDINGS: LOWER CHEST: There is no basilar pleural or apical pericardial effusion. HEPATOBILIARY: The hepatic contours and density are normal. There is no intra- or extrahepatic biliary dilatation. The gallbladder is normal. PANCREAS: The pancreatic parenchymal contours are normal and there is no ductal dilatation. There is no peripancreatic fluid collection. SPLEEN: Normal. ADRENALS/URINARY TRACT: --Adrenal glands: Normal. --Right kidney/ureter: There is an unchanged lower pole cyst measuring 1.8 cm. No hydronephrosis, perinephric stranding or solid renal mass. Unchanged punctate calcification along the lateral aspect of the renal cortex. --Left kidney/ureter: No hydronephrosis, nephroureterolithiasis, perinephric stranding or solid renal mass. --Urinary bladder: Normal for degree of distention STOMACH/BOWEL: --Stomach/Duodenum: There is no hiatal hernia or other gastric abnormality. The duodenal course and caliber are normal. --Small  bowel: No dilatation or inflammation. --Colon: No focal abnormality. --Appendix: Surgically absent. VASCULAR/LYMPHATIC: Normal course and caliber of the major abdominal vessels. No abdominal or  pelvic lymphadenopathy. REPRODUCTIVE: Normal prostate size with symmetric seminal vesicles. MUSCULOSKELETAL. Grade 1 anterolisthesis at L4-L5, unchanged. OTHER: None. IMPRESSION: No acute abdominal or pelvic abnormality. Electronically Signed   By: Ulyses Jarred M.D.   On: 08/06/2018 20:46    Procedures Procedures (including critical care time)  Medications Ordered in ED Medications  cephALEXin (KEFLEX) capsule 500 mg (500 mg Oral Given 08/06/18 2148)     Initial Impression / Assessment and Plan / ED Course  I have reviewed the triage vital signs and the nursing notes.  Pertinent labs & imaging results that were available during my care of the patient were reviewed by me and considered in my medical decision making (see chart for details).     Pt with history of hematuria with none on current sample provided. Urine was sent for cx, will treat for possible cystitis, abx started. Return precautions discussed. Plan f/u with pcp for a recheck of urine after abx completed.  Final Clinical Impressions(s) / ED Diagnoses   Final diagnoses:  History of gross hematuria    ED Discharge Orders         Ordered    cephALEXin (KEFLEX) 500 MG capsule  2 times daily     08/06/18 2138           Evalee Jefferson, PA-C 08/07/18 2212    Maudie Flakes, MD 08/07/18 2342

## 2018-08-21 DIAGNOSIS — E6609 Other obesity due to excess calories: Secondary | ICD-10-CM | POA: Diagnosis not present

## 2018-08-21 DIAGNOSIS — Z6830 Body mass index (BMI) 30.0-30.9, adult: Secondary | ICD-10-CM | POA: Diagnosis not present

## 2018-08-21 DIAGNOSIS — N419 Inflammatory disease of prostate, unspecified: Secondary | ICD-10-CM | POA: Diagnosis not present

## 2018-08-21 DIAGNOSIS — Z1389 Encounter for screening for other disorder: Secondary | ICD-10-CM | POA: Diagnosis not present

## 2018-08-23 DIAGNOSIS — G4733 Obstructive sleep apnea (adult) (pediatric): Secondary | ICD-10-CM | POA: Diagnosis not present

## 2018-09-23 DIAGNOSIS — G4733 Obstructive sleep apnea (adult) (pediatric): Secondary | ICD-10-CM | POA: Diagnosis not present

## 2018-09-28 DIAGNOSIS — R202 Paresthesia of skin: Secondary | ICD-10-CM | POA: Diagnosis not present

## 2018-09-28 DIAGNOSIS — G253 Myoclonus: Secondary | ICD-10-CM | POA: Diagnosis not present

## 2018-09-28 DIAGNOSIS — N401 Enlarged prostate with lower urinary tract symptoms: Secondary | ICD-10-CM | POA: Diagnosis not present

## 2018-09-28 DIAGNOSIS — Z1389 Encounter for screening for other disorder: Secondary | ICD-10-CM | POA: Diagnosis not present

## 2018-09-28 DIAGNOSIS — E1142 Type 2 diabetes mellitus with diabetic polyneuropathy: Secondary | ICD-10-CM | POA: Diagnosis not present

## 2018-10-02 DIAGNOSIS — E1140 Type 2 diabetes mellitus with diabetic neuropathy, unspecified: Secondary | ICD-10-CM | POA: Diagnosis not present

## 2018-10-02 DIAGNOSIS — Z1389 Encounter for screening for other disorder: Secondary | ICD-10-CM | POA: Diagnosis not present

## 2018-10-02 DIAGNOSIS — R109 Unspecified abdominal pain: Secondary | ICD-10-CM | POA: Diagnosis not present

## 2018-10-02 DIAGNOSIS — N411 Chronic prostatitis: Secondary | ICD-10-CM | POA: Diagnosis not present

## 2018-10-02 DIAGNOSIS — I10 Essential (primary) hypertension: Secondary | ICD-10-CM | POA: Diagnosis not present

## 2018-10-12 ENCOUNTER — Encounter (HOSPITAL_COMMUNITY): Payer: Self-pay

## 2018-10-12 ENCOUNTER — Emergency Department (HOSPITAL_COMMUNITY)
Admission: EM | Admit: 2018-10-12 | Discharge: 2018-10-13 | Disposition: A | Discharge status: Other Acute Inpatient Hospital | Payer: 59 | Source: Home / Self Care | Attending: Emergency Medicine | Admitting: Emergency Medicine

## 2018-10-12 ENCOUNTER — Other Ambulatory Visit: Payer: Self-pay

## 2018-10-12 DIAGNOSIS — T6592XA Toxic effect of unspecified substance, intentional self-harm, initial encounter: Secondary | ICD-10-CM

## 2018-10-12 DIAGNOSIS — I10 Essential (primary) hypertension: Secondary | ICD-10-CM

## 2018-10-12 DIAGNOSIS — Z7984 Long term (current) use of oral hypoglycemic drugs: Secondary | ICD-10-CM

## 2018-10-12 DIAGNOSIS — F17290 Nicotine dependence, other tobacco product, uncomplicated: Secondary | ICD-10-CM

## 2018-10-12 DIAGNOSIS — Z79899 Other long term (current) drug therapy: Secondary | ICD-10-CM

## 2018-10-12 DIAGNOSIS — R45851 Suicidal ideations: Secondary | ICD-10-CM

## 2018-10-12 DIAGNOSIS — F329 Major depressive disorder, single episode, unspecified: Secondary | ICD-10-CM | POA: Insufficient documentation

## 2018-10-12 DIAGNOSIS — Z7982 Long term (current) use of aspirin: Secondary | ICD-10-CM | POA: Insufficient documentation

## 2018-10-12 DIAGNOSIS — F333 Major depressive disorder, recurrent, severe with psychotic symptoms: Secondary | ICD-10-CM | POA: Diagnosis not present

## 2018-10-12 DIAGNOSIS — F251 Schizoaffective disorder, depressive type: Secondary | ICD-10-CM | POA: Insufficient documentation

## 2018-10-12 DIAGNOSIS — F32A Depression, unspecified: Secondary | ICD-10-CM

## 2018-10-12 DIAGNOSIS — T603X2A Toxic effect of herbicides and fungicides, intentional self-harm, initial encounter: Secondary | ICD-10-CM

## 2018-10-12 DIAGNOSIS — R000 Tachycardia, unspecified: Secondary | ICD-10-CM | POA: Diagnosis not present

## 2018-10-12 DIAGNOSIS — E119 Type 2 diabetes mellitus without complications: Secondary | ICD-10-CM

## 2018-10-12 LAB — CBC WITH DIFFERENTIAL/PLATELET
Abs Immature Granulocytes: 0.04 10*3/uL (ref 0.00–0.07)
Basophils Absolute: 0.1 10*3/uL (ref 0.0–0.1)
Basophils Relative: 1 %
Eosinophils Absolute: 0.2 10*3/uL (ref 0.0–0.5)
Eosinophils Relative: 1 %
HCT: 52.7 % — ABNORMAL HIGH (ref 39.0–52.0)
Hemoglobin: 17.2 g/dL — ABNORMAL HIGH (ref 13.0–17.0)
Immature Granulocytes: 0 %
Lymphocytes Relative: 16 %
Lymphs Abs: 2 10*3/uL (ref 0.7–4.0)
MCH: 29 pg (ref 26.0–34.0)
MCHC: 32.6 g/dL (ref 30.0–36.0)
MCV: 88.7 fL (ref 80.0–100.0)
Monocytes Absolute: 1 10*3/uL (ref 0.1–1.0)
Monocytes Relative: 8 %
Neutro Abs: 8.9 10*3/uL — ABNORMAL HIGH (ref 1.7–7.7)
Neutrophils Relative %: 74 %
Platelets: 283 10*3/uL (ref 150–400)
RBC: 5.94 MIL/uL — ABNORMAL HIGH (ref 4.22–5.81)
RDW: 13.2 % (ref 11.5–15.5)
WBC: 12.1 10*3/uL — ABNORMAL HIGH (ref 4.0–10.5)
nRBC: 0 % (ref 0.0–0.2)

## 2018-10-12 LAB — COMPREHENSIVE METABOLIC PANEL
ALT: 26 U/L (ref 0–44)
AST: 17 U/L (ref 15–41)
Albumin: 4.8 g/dL (ref 3.5–5.0)
Alkaline Phosphatase: 50 U/L (ref 38–126)
Anion gap: 14 (ref 5–15)
BUN: 20 mg/dL (ref 6–20)
CO2: 16 mmol/L — ABNORMAL LOW (ref 22–32)
Calcium: 10.5 mg/dL — ABNORMAL HIGH (ref 8.9–10.3)
Chloride: 112 mmol/L — ABNORMAL HIGH (ref 98–111)
Creatinine, Ser: 1.34 mg/dL — ABNORMAL HIGH (ref 0.61–1.24)
GFR calc Af Amer: >60 mL/min (ref >60)
GFR calc non Af Amer: 58 mL/min — ABNORMAL LOW (ref >60)
Glucose, Bld: 154 mg/dL — ABNORMAL HIGH (ref 70–99)
Potassium: 3.9 mmol/L (ref 3.5–5.1)
Sodium: 142 mmol/L (ref 135–145)
Total Bilirubin: 0.7 mg/dL (ref 0.3–1.2)
Total Protein: 8.3 g/dL — ABNORMAL HIGH (ref 6.5–8.1)

## 2018-10-12 LAB — LACTIC ACID, PLASMA
Lactic Acid, Venous: 1.1 mmol/L (ref 0.5–1.9)
Lactic Acid, Venous: 1.2 mmol/L (ref 0.5–1.9)

## 2018-10-12 LAB — ETHANOL: Alcohol, Ethyl (B): <10 mg/dL (ref <10)

## 2018-10-12 LAB — RAPID URINE DRUG SCREEN, HOSP PERFORMED
Amphetamines: POSITIVE — AB
Barbiturates: NOT DETECTED
Benzodiazepines: NOT DETECTED
Cocaine: NOT DETECTED
Opiates: NOT DETECTED
Tetrahydrocannabinol: NOT DETECTED

## 2018-10-12 LAB — ACETAMINOPHEN LEVEL: Acetaminophen (Tylenol), Serum: <10 ug/mL — ABNORMAL LOW (ref 10–30)

## 2018-10-12 LAB — SALICYLATE LEVEL: Salicylate Lvl: <7 mg/dL (ref 2.8–30.0)

## 2018-10-12 MED ORDER — ATORVASTATIN CALCIUM 10 MG PO TABS: 20.0000 mg | ORAL_TABLET | Freq: Every day | ORAL | Status: DC

## 2018-10-12 MED ORDER — PANTOPRAZOLE SODIUM 40 MG PO TBEC
40.0000 mg | DELAYED_RELEASE_TABLET | Freq: Every day | ORAL | Status: DC
  Administered 2018-10-13: 40 mg via ORAL
  Filled 2018-10-12: qty 1

## 2018-10-12 MED ORDER — ALUM & MAG HYDROXIDE-SIMETH 200-200-20 MG/5ML PO SUSP: 30.0000 mL | ORAL | Status: DC | PRN

## 2018-10-12 MED ORDER — OXYCODONE-ACETAMINOPHEN 5-325 MG PO TABS
1.0000 | ORAL_TABLET | Freq: Once | ORAL | Status: AC
  Administered 2018-10-12: 1 via ORAL
  Filled 2018-10-12: qty 1

## 2018-10-12 MED ORDER — SODIUM CHLORIDE 0.9 % IV BOLUS
1000.0000 mL | Freq: Once | INTRAVENOUS | Status: AC
  Administered 2018-10-12: 1000 mL via INTRAVENOUS

## 2018-10-12 MED ORDER — TAMSULOSIN HCL 0.4 MG PO CAPS
0.4000 mg | ORAL_CAPSULE | Freq: Every day | ORAL | Status: DC
  Administered 2018-10-13: 0.4 mg via ORAL
  Filled 2018-10-12: qty 1

## 2018-10-12 MED ORDER — LORATADINE 10 MG PO TABS
10.0000 mg | ORAL_TABLET | Freq: Every day | ORAL | Status: DC
  Administered 2018-10-13: 10 mg via ORAL
  Filled 2018-10-12: qty 1

## 2018-10-12 MED ORDER — DOCUSATE SODIUM 100 MG PO CAPS
100.0000 mg | ORAL_CAPSULE | Freq: Every day | ORAL | Status: DC
  Administered 2018-10-13: 100 mg via ORAL
  Filled 2018-10-12: qty 1

## 2018-10-12 MED ORDER — CEPHALEXIN 500 MG PO CAPS
500.0000 mg | ORAL_CAPSULE | Freq: Two times a day (BID) | ORAL | Status: DC
  Administered 2018-10-12 – 2018-10-13 (×2): 500 mg via ORAL
  Filled 2018-10-12 (×2): qty 1

## 2018-10-12 MED ORDER — METFORMIN HCL 500 MG PO TABS
1000.0000 mg | ORAL_TABLET | Freq: Every day | ORAL | Status: DC
  Administered 2018-10-13: 1000 mg via ORAL
  Filled 2018-10-12: qty 2

## 2018-10-12 MED ORDER — HYDROXYZINE HCL 25 MG PO TABS: 25.0000 mg | ORAL_TABLET | Freq: Three times a day (TID) | ORAL | Status: DC | PRN

## 2018-10-12 MED ORDER — GLIPIZIDE ER 5 MG PO TB24
5.0000 mg | ORAL_TABLET | Freq: Every day | ORAL | Status: DC
  Filled 2018-10-12 (×4): qty 1

## 2018-10-12 MED ORDER — ONDANSETRON HCL 4 MG/2ML IJ SOLN
4.0000 mg | Freq: Once | INTRAMUSCULAR | Status: AC
  Administered 2018-10-12: 4 mg via INTRAVENOUS
  Filled 2018-10-12: qty 2

## 2018-10-12 MED ORDER — ASPIRIN EC 81 MG PO TBEC
81.0000 mg | DELAYED_RELEASE_TABLET | Freq: Every day | ORAL | Status: DC
  Administered 2018-10-13: 81 mg via ORAL
  Filled 2018-10-12: qty 1

## 2018-10-12 MED ORDER — AMLODIPINE BESYLATE 5 MG PO TABS
10.0000 mg | ORAL_TABLET | Freq: Every day | ORAL | Status: DC
  Administered 2018-10-12 – 2018-10-13 (×2): 10 mg via ORAL
  Filled 2018-10-12 (×2): qty 2

## 2018-10-12 MED ORDER — ONDANSETRON 4 MG PO TBDP
4.0000 mg | ORAL_TABLET | Freq: Three times a day (TID) | ORAL | Status: DC | PRN
  Administered 2018-10-12: 4 mg via ORAL
  Filled 2018-10-12: qty 1

## 2018-10-12 MED ORDER — ALUM & MAG HYDROXIDE-SIMETH 200-200-20 MG/5ML PO SUSP
30.0000 mL | Freq: Once | ORAL | Status: AC
  Administered 2018-10-12: 30 mL via ORAL
  Filled 2018-10-12: qty 30

## 2018-10-12 MED ORDER — TRAZODONE HCL 50 MG PO TABS
100.0000 mg | ORAL_TABLET | Freq: Every day | ORAL | Status: DC
  Administered 2018-10-12: 100 mg via ORAL
  Filled 2018-10-12: qty 2

## 2018-10-12 MED ORDER — FLUOXETINE HCL 20 MG PO CAPS
20.0000 mg | ORAL_CAPSULE | Freq: Every day | ORAL | Status: DC
  Administered 2018-10-13: 20 mg via ORAL
  Filled 2018-10-12: qty 1

## 2018-10-12 MED ORDER — LACTULOSE 10 GM/15ML PO SOLN: 10.0000 g | Freq: Every day | ORAL | Status: DC | PRN

## 2018-10-12 MED ORDER — PANTOPRAZOLE SODIUM 40 MG IV SOLR
40.0000 mg | Freq: Once | INTRAVENOUS | Status: AC
  Administered 2018-10-12: 40 mg via INTRAVENOUS
  Filled 2018-10-12: qty 40

## 2018-10-12 NOTE — ED Notes (Signed)
Pt changed into paper scrubs and put on monitor per RN

## 2018-10-12 NOTE — ED Triage Notes (Addendum)
Pt reports that he drank weed killer all day yesterday and this morning intentionally to kill himself. Wife reports he vomited last night and had diarrhea throughout night. Pt reports abdominal pain and chest hurts

## 2018-10-12 NOTE — ED Provider Notes (Addendum)
[facility] EMERGENCY DEPARTMENT Provider Note   CSN: [id number] Arrival date & time: 10/12/18  1102     History   Chief Complaint Chief Complaint  Patient presents with  . Drug Overdose    HPI Travis [name] is a 58 y.o. male.  Level 5 caveat for acuity of condition and psychiatric illness.  Patient allegedly drank an entire bottle of weed killer the past 24 hours.  He is depressed and suicidal.  Wife reports vomiting and diarrhea throughout the night.  Patient complains of abdominal pain and nausea on with pain with deep inspiration.  Past medical history includes schizoaffective disorder, type 2 diabetes, hypertension, depression.     Past Medical History:  Diagnosis Date  . Dyslipidemia   . Fatigue    NAUSEA,BLOATING  . H. pylori infection 09/15/2006  . Hematochezia 08/30/2007  . History of nuclear stress test 04/03/2009   exercise myoview; normal pattern of perfusion; low risk scan   . Hypertension    treated  . NIDDM (non-insulin dependent diabetes mellitus) 2011   type 2  . Schizoaffective disorder ([place])   . Tobacco chew use   . Unspecified vitamin D deficiency     Patient Active Problem List   Diagnosis Date Noted  . Major depressive disorder, recurrent episode, severe, with psychosis ([place]) 04/19/2017  . Depression 04/16/2017  . AKI (acute kidney injury) ([place])   . Lactic acid acidosis   . Left sided numbness   . Sepsis ([place])   . Elevated lactic acid level   . Leukocytosis   . Volume depletion   . Hypotension 03/13/2017  . Chest pain with moderate risk for cardiac etiology 01/13/2014  . Dyspnea 01/13/2014  . Family history of coronary artery disease- (mother had MI 65) 01/13/2014  . Tobacco chew use   . Type 2 diabetes mellitus ([place])   . Dyslipidemia   . Unspecified vitamin D deficiency   . 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 Esophageal reflux 09/28/2007  . DYSPHAGIA UNSPECIFIED 09/28/2007  . BLOOD IN STOOL, OCCULT 09/28/2007    . HERNIORRHAPHY, HX OF 09/28/2007    Past Surgical History:  Procedure Laterality Date  . APPENDECTOMY    . HERNIA REPAIR     RIGHT  . KNEE SURGERY     BOTH  . TRANSTHORACIC ECHOCARDIOGRAM  04/03/2009   EF=>55%; trace TR, mild pulm regurg        Home Medications    Prior to Admission medications   Medication Sig Start Date End Date Taking? Authorizing Provider  amLODipine (NORVASC) 10 MG tablet Take 1 tablet (10 mg total) by mouth daily. For high blood pressure 04/23/17  Yes Nwoko, Herbert Pun I, NP  acetaminophen (TYLENOL) 325 MG tablet Take 2 tablets (650 mg total) by mouth every 6 (six) hours as needed for mild pain. 04/22/17   Lindell Spar I, NP  aspirin EC 81 MG tablet Take 1 tablet (81 mg total) by mouth daily. For heart health 04/22/17   Lindell Spar I, NP  atorvastatin (LIPITOR) 20 MG tablet Take 20 mg by mouth daily. 11/23/17   [provider]  cephALEXin (KEFLEX) 500 MG capsule Take 1 capsule (500 mg total) by mouth 2 (two) times daily. 08/06/18   Evalee Jefferson, PA-C  chlorhexidine (PERIDEX) 0.12 % solution Use as directed 15 mLs in the mouth or throat 2 (two) times daily. For dental problems. Patient not taking: Reported on 12/18/2017 04/22/17   Lindell Spar I, NP  FLUoxetine (PROZAC) 20 MG  capsule Take 1 capsule (20 mg total) by mouth daily. For depression 04/23/17   Lindell Spar I, NP  glipiZIDE (GLUCOTROL XL) 10 MG 24 hr tablet Take 1 tablet (10 mg total) by mouth daily. For diabetes management 04/22/17   Lindell Spar I, NP  HYDROcodone-acetaminophen (NORCO/VICODIN) 5-325 MG tablet TAKE 1 TABLET BY MOUTH TWICE DAILY AS NEEDED FOR 30 DAYS 06/12/18   [provider]  hydrOXYzine (ATARAX/VISTARIL) 25 MG tablet Take 1 tablet (25 mg total) by mouth 3 (three) times daily as needed for anxiety. 04/22/17   Lindell Spar I, NP  indomethacin (INDOCIN) 25 MG capsule Take 1 capsule by mouth 2 (two) times daily. 11/11/17   [provider]  lidocaine (LIDODERM) 5 % Place 1  patch onto the skin daily. Remove & Discard patch within 12 hours or as directed by MD: For pain management Patient not taking: Reported on 12/18/2017 04/23/17   Lindell Spar I, NP  loratadine (CLARITIN) 10 MG tablet Take 1 tablet (10 mg total) by mouth daily. For allergies 04/22/17   Lindell Spar I, NP  metFORMIN (GLUCOPHAGE) 1000 MG tablet Take 1 tablet (1,000 mg total) by mouth daily. For diabetes management 04/22/17   Lindell Spar I, NP  nitroGLYCERIN (NITROSTAT) 0.4 MG SL tablet Place 1 tablet (0.4 mg total) under the tongue every 5 (five) minutes as needed for chest pain. Patient not taking: Reported on 12/18/2017 04/22/17   Lindell Spar I, NP  pantoprazole (PROTONIX) 40 MG tablet Take 1 tablet (40 mg total) by mouth daily. For acid reflux 04/22/17   Lindell Spar I, NP  polyethylene glycol (MIRALAX / GLYCOLAX) packet Take 17 g by mouth 2 (two) times daily. For constipation 04/22/17   Lindell Spar I, NP  risperiDONE (RISPERDAL) 0.25 MG tablet Take 0.25 mg by mouth daily. 11/21/17   [provider]  senna-docusate (SENOKOT-S) 8.6-50 MG tablet Take 1 tablet by mouth 2 (two) times daily. For constipation 04/22/17   Lindell Spar I, NP  tamsulosin (FLOMAX) 0.4 MG CAPS capsule Take 1 capsule (0.4 mg total) by mouth daily after breakfast. For prostate health 04/22/17   Lindell Spar I, NP  traZODone (DESYREL) 100 MG tablet Take 1 tablet (100 mg total) by mouth at bedtime. For sleep 04/22/17   Encarnacion Slates, NP    Family History Family History  Problem Relation Age of Onset  . Stroke Father 35  . Pneumonia Father   . Heart failure Mother 22  . Coronary artery disease Mother        also MI  . Heart attack Mother   . Heart attack Maternal Uncle        died in 30s  . Cancer Other        x2; maternal side  . Diabetes Maternal Grandmother     Social History Social History   Tobacco Use  . Smoking status: Never Smoker  . Smokeless tobacco: Current User    Types: Snuff, Chew  . Tobacco  comment: started chewing tobacco at age 64 years old-stopped chewing and dips snuff only  Substance Use Topics  . Alcohol use: No    Alcohol/week: 0.0 standard drinks  . Drug use: No     Allergies   Doxycycline and Lisinopril   Review of Systems Review of Systems  Unable to perform ROS: Acuity of condition     Physical Exam Updated Vital Signs BP (!) 132/96   Pulse (!) 119   Temp 98 F (36.7 C) (Oral)  Resp 16   Ht 5\' 5"  (1.651 m)   Wt 83.9 kg   SpO2 98%   BMI 30.79 kg/m   Physical Exam Vitals signs and nursing note reviewed.  Constitutional:      Appearance: He is well-developed.     Comments: Crying.  HENT:     Head: Normocephalic and atraumatic.  Eyes:     Conjunctiva/sclera: Conjunctivae normal.  Neck:     Musculoskeletal: Neck supple.  Cardiovascular:     Rate and Rhythm: Normal rate and regular rhythm.  Pulmonary:     Effort: Pulmonary effort is normal.     Breath sounds: Normal breath sounds.  Abdominal:     General: Bowel sounds are normal.     Palpations: Abdomen is soft.     Comments: No acute abdomen  Musculoskeletal: Normal range of motion.  Skin:    General: Skin is warm and dry.  Neurological:     Mental Status: He is oriented to person, place, and time.  Psychiatric:     Comments: Flat affect, depressed      ED Treatments / Results  Labs (all labs ordered are listed, but only abnormal results are displayed) Labs Reviewed  CBC WITH DIFFERENTIAL/PLATELET - Abnormal; Notable for the following components:      Result Value   WBC 12.1 (*)    RBC 5.94 (*)    Hemoglobin 17.2 (*)    HCT 52.7 (*)    Neutro Abs 8.9 (*)    All other components within normal limits  COMPREHENSIVE METABOLIC PANEL - Abnormal; Notable for the following components:   Chloride 112 (*)    CO2 16 (*)    Glucose, Bld 154 (*)    Creatinine, Ser 1.34 (*)    Calcium 10.5 (*)    Total Protein 8.3 (*)    GFR calc non Af Amer 58 (*)    All other components  within normal limits  RAPID URINE DRUG SCREEN, HOSP PERFORMED - Abnormal; Notable for the following components:   Amphetamines POSITIVE (*)    All other components within normal limits  ACETAMINOPHEN LEVEL - Abnormal; Notable for the following components:   Acetaminophen (Tylenol), Serum <10 (*)    All other components within normal limits  ETHANOL  SALICYLATE LEVEL  LACTIC ACID, PLASMA  LACTIC ACID, PLASMA    EKG None  Radiology No results found.  Procedures Procedures (including critical care time)  Medications Ordered in ED Medications  sodium chloride 0.9 % bolus 1,000 mL (0 mLs Intravenous Stopped 10/12/18 1258)  pantoprazole (PROTONIX) injection 40 mg (40 mg Intravenous Given 10/12/18 1202)  alum & mag hydroxide-simeth (MAALOX/MYLANTA) 200-200-20 MG/5ML suspension 30 mL (30 mLs Oral Given 10/12/18 1202)  ondansetron (ZOFRAN) injection 4 mg (4 mg Intravenous Given 10/12/18 1306)     Initial Impression / Assessment and Plan / ED Course  I have reviewed the triage vital signs and the nursing notes.  Pertinent labs & imaging results that were available during my care of the patient were reviewed by me and considered in my medical decision making (see chart for details).     Patient is depressed and suicidal.  He allegedly drank a large amount of weed killer.  He is hemodynamically stable.  Screening labs showed no hyperacute abnormality.  Drug screen positive for amphetamines.  IV fluids, IV Zofran, IV Protonix.  Consult with poison control.  Consult with behavioral health.  CRITICAL CARE Performed by: Nat Christen Total critical care time: 35 minutes Critical  care time was exclusive of separately billable procedures and treating other patients. Critical care was necessary to treat or prevent imminent or life-threatening deterioration. Critical care was time spent personally by me on the following activities: development of treatment plan with patient and/or surrogate as well  as nursing, discussions with consultants, evaluation of patient's response to treatment, examination of patient, obtaining history from patient or surrogate, ordering and performing treatments and interventions, ordering and review of laboratory studies, ordering and review of radiographic studies, pulse oximetry and re-evaluation of patient's condition.  Final Clinical Impressions(s) / ED Diagnoses   Final diagnoses:  Ingestion of toxin, intentional self-harm, initial encounter [facility])  Depression, unspecified depression type  Suicidal ideation    ED Discharge Orders    None       Nat Christen, MD 10/12/18 1354    Nat Christen, MD 10/12/18 1445

## 2018-10-12 NOTE — BH Assessment (Signed)
Pt is not medically cleared at this time and will be medically clear at 1700.  Will complete assessment at that time.

## 2018-10-12 NOTE — BH Assessment (Addendum)
Tele Assessment Note   Patient Name: Travis [name] MRN: [id number] Referring Physician: Melina Copa  Location of Patient: [facility] ED Location of Provider: [place]  Travis [name] is an 58 y.o. male.  The pt came in after consuming weed killer.  The pt stated he was upset, because he is fearful that his wife will leave him.  The pt believes his marriage isn't legal.  The pt's wife stated they have been married for 7 years and they are legally married.  She isn't sure why the pt thinks the marriage isn't legal.  The pt's wife denies any problems in the marriage.  He isn't currently seeing a counselor or psychiatrist.  He was at [facility] in 2018 for SI with a plan to drink bleach.    The pt lives with his wife.  He works and denies having any problems at work.  He denies self harm, HI, legal issues, history of abuse and hallucinations.  He stated he isn't sleeping or eating well.  The pt denies SA.  His UDS is positive for amphetamines.  Pt is dressed in scrubs. He is alert and oriented x4. Pt speaks in a clear tone, at moderate volume and normal pace. Eye contact is good. Pt's mood is irritated. Thought process is coherent and relevant. There is no indication Pt is currently responding to internal stimuli or experiencing delusional thought content.?Pt was guarded throughout assessment and initially refused to talk.   Diagnosis: F25.1 Schizoaffective disorder, Depressive type  Past Medical History:  Past Medical History:  Diagnosis Date  . Dyslipidemia   . Fatigue    NAUSEA,BLOATING  . H. pylori infection 09/15/2006  . Hematochezia 08/30/2007  . History of nuclear stress test 04/03/2009   exercise myoview; normal pattern of perfusion; low risk scan   . Hypertension    treated  . NIDDM (non-insulin dependent diabetes mellitus) 2011   type 2  . Schizoaffective disorder ([place])   . Tobacco chew use   . Unspecified vitamin D deficiency     Past Surgical History:   Procedure Laterality Date  . APPENDECTOMY    . HERNIA REPAIR     RIGHT  . KNEE SURGERY     BOTH  . TRANSTHORACIC ECHOCARDIOGRAM  04/03/2009   EF=>55%; trace TR, mild pulm regurg    Family History:  Family History  Problem Relation Age of Onset  . Stroke Father 57  . Pneumonia Father   . Heart failure Mother 39  . Coronary artery disease Mother        also MI  . Heart attack Mother   . Heart attack Maternal Uncle        died in 41s  . Cancer Other        x2; maternal side  . Diabetes Maternal Grandmother     Social History:  reports that he has never smoked. His smokeless tobacco use includes snuff and chew. He reports that he does not drink alcohol or use drugs.  Additional Social History:  Alcohol / Drug Use Pain Medications: See MAR Prescriptions: See MAR Over the Counter: See MAR History of alcohol / drug use?: No history of alcohol / drug abuse Longest period of sobriety (when/how long): NA  CIWA: CIWA-Ar BP: (!) 146/98 Pulse Rate: 100 COWS:    Allergies:  Allergies  Allergen Reactions  . Doxycycline Other (See Comments)    Reaction:  Blisters   . Lisinopril Swelling    Home Medications: (Not in a  hospital admission)   OB/GYN Status:  No LMP for male patient.  General Assessment Data Assessment unable to be completed: Yes Reason for not completing assessment: pt not medically cleared Location of Assessment: [facility] TTS Assessment: In system Is this a Tele or Face-to-Face Assessment?: Tele Assessment Is this an Initial Assessment or a Re-assessment for this encounter?: Initial Assessment Patient Accompanied by:: Adult Permission Given to speak with another: Yes Name, Relationship and Phone Number: Melinda [name] Language Other than English: No Living Arrangements: Other (Comment)(home) What gender do you identify as?: Male Marital status: Married [place] name: NA Living Arrangements: Spouse/significant other Can pt return to current living  arrangement?: Yes Admission Status: Voluntary Is patient capable of signing voluntary admission?: Yes Referral Source: Self/Family/Friend Insurance type: [place]     Crisis Care Plan Living Arrangements: Spouse/significant other Legal Guardian: Other:(Self) Name of Psychiatrist: None Name of Therapist: none  Education Status Is patient currently in school?: No Is the patient employed, unemployed or receiving disability?: Employed  Risk to self with the past 6 months Suicidal Ideation: Yes-Currently Present Has patient been a risk to self within the past 6 months prior to admission? : Yes Suicidal Intent: Yes-Currently Present Has patient had any suicidal intent within the past 6 months prior to admission? : Yes Is patient at risk for suicide?: Yes Suicidal Plan?: Yes-Currently Present Has patient had any suicidal plan within the past 6 months prior to admission? : Yes Specify Current Suicidal Plan: drink weed killer Access to Means: Yes Specify Access to Suicidal Means: had weed killer What has been your use of drugs/alcohol within the last 12 months?: none Previous Attempts/Gestures: No How many times?: 0 Other Self Harm Risks: pt denies Triggers for Past Attempts: None known Intentional Self Injurious Behavior: None Family Suicide History: Unknown Recent stressful life event(s): Other (Comment)(pt thinks he is not legally married) Persecutory voices/beliefs?: Yes Depression: Yes Depression Symptoms: Insomnia Substance abuse history and/or treatment for substance abuse?: No Suicide prevention information given to non-admitted patients: Not applicable  Risk to Others within the past 6 months Homicidal Ideation: No Does patient have any lifetime risk of violence toward others beyond the six months prior to admission? : No Thoughts of Harm to Others: No Current Homicidal Intent: No Current Homicidal Plan: No Access to Homicidal Means: No Identified Victim: Pt  denies History of harm to others?: No Assessment of Violence: None Noted Violent Behavior Description: pt denies Does patient have access to weapons?: No Criminal Charges Pending?: No Does patient have a court date: No Is patient on probation?: No  Psychosis Hallucinations: None noted Delusions: Persecutory  Mental Status Report Appearance/Hygiene: Unremarkable, In scrubs Eye Contact: Good Motor Activity: Unable to assess Speech: Logical/coherent, Other (Comment)(guarded) Level of Consciousness: Alert Mood: Depressed Affect: Depressed Anxiety Level: None Thought Processes: Coherent, Relevant Judgement: Impaired Orientation: Person, Place, Time, Situation Obsessive Compulsive Thoughts/Behaviors: None  Cognitive Functioning Concentration: Normal Memory: Recent Intact, Remote Intact Is patient IDD: No Insight: Poor Impulse Control: Poor Appetite: Poor Have you had any weight changes? : No Change Sleep: Decreased Total Hours of Sleep: (5) Vegetative Symptoms: None  ADLScreening [facility] Assessment Services) Patient's cognitive ability adequate to safely complete daily activities?: Yes Patient able to express need for assistance with ADLs?: Yes Independently performs ADLs?: Yes (appropriate for developmental age)  Prior Inpatient Therapy Prior Inpatient Therapy: Yes Prior Therapy Dates: 2018 Prior Therapy Facilty/Provider(s): [facility] Reason for Treatment: SI  Prior Outpatient Therapy Prior Outpatient Therapy: No Does patient have an  ACCT team?: No Does patient have Intensive In-House Services?  : No Does patient have [facility] services? : No Does patient have P4CC services?: No  ADL Screening (condition at time of admission) Patient's cognitive ability adequate to safely complete daily activities?: Yes Patient able to express need for assistance with ADLs?: Yes Independently performs ADLs?: Yes (appropriate for developmental age)       Abuse/Neglect Assessment  (Assessment to be complete while patient is alone) Abuse/Neglect Assessment Can Be Completed: Yes Physical Abuse: Denies Verbal Abuse: Denies Sexual Abuse: Denies Exploitation of patient/patient's resources: Denies Self-Neglect: Denies Values / Beliefs Cultural Requests During Hospitalization: None Spiritual Requests During Hospitalization: None Consults Spiritual Care Consult Needed: No Social Work Consult Needed: No Regulatory affairs officer (For Healthcare) Does Patient Have a Medical Advance Directive?: No          Disposition:  Disposition Initial Assessment Completed for this Encounter: Yes NP Lindon Romp recommends inpatient treatment.  RN Lonn Georgia was made aware of the recommendation.  This service was provided via telemedicine using a 2-way, interactive audio and video technology.  Names of all persons participating in this telemedicine service and their role in this encounter. Name: Dean [name] Role: Pt  Name: Melinda [name] Role: Pt's wife  Name:  Role:   Name:  Role:     Enzo Montgomery 10/12/2018 8:29 PM

## 2018-10-12 NOTE — ED Provider Notes (Signed)
Signout from Dr. Lacinda Axon.  58 year old male with depression and suicidal thoughts here after allegedly drinking a bottle of weed killer.  Poison control was made some basic recommendations of supportive care.  He has been eating and drinking.  His labs do not show any significant abnormalities.  Plan is for TTS consult once medically clear at 6 hours observation.  Clinical Course as of Oct 12 2345  Mon Oct 12, 2018  1716 Evaluated patient.  He is nontoxic-appearing.  He is complaining of some stomach pain.  I have ordered him some pain medicine and some Maalox.  I think at this point he is appropriate for TTS consult.   [MB]  2132 Patient has been evaluated and no life-threatening medical conditions have been identified on a medical screening exam.     [MB]  2227 And from behavioral health is to transfer down to [facility] tomorrow morning after 9 AM.   [MB]    Clinical Course User Index [MB] Hayden Rasmussen, MD      Hayden Rasmussen, MD 10/12/18 [phone]

## 2018-10-12 NOTE — ED Notes (Signed)
Pt states he does not need to urinate at this time and has not been able to in 3 days.Travis [name]

## 2018-10-13 ENCOUNTER — Inpatient Hospital Stay (HOSPITAL_COMMUNITY)
Admission: AD | Admit: 2018-10-13 | Discharge: 2018-10-15 | DRG: 885 | Disposition: A | Discharge status: Home / Self Care | Payer: 59 | Source: Intra-hospital | Attending: Psychiatry | Admitting: Psychiatry

## 2018-10-13 ENCOUNTER — Encounter (HOSPITAL_COMMUNITY): Payer: Self-pay

## 2018-10-13 ENCOUNTER — Other Ambulatory Visit: Payer: Self-pay

## 2018-10-13 DIAGNOSIS — F419 Anxiety disorder, unspecified: Secondary | ICD-10-CM | POA: Diagnosis present

## 2018-10-13 DIAGNOSIS — Z79899 Other long term (current) drug therapy: Secondary | ICD-10-CM | POA: Diagnosis not present

## 2018-10-13 DIAGNOSIS — Z7982 Long term (current) use of aspirin: Secondary | ICD-10-CM | POA: Diagnosis not present

## 2018-10-13 DIAGNOSIS — Z888 Allergy status to other drugs, medicaments and biological substances status: Secondary | ICD-10-CM

## 2018-10-13 DIAGNOSIS — E559 Vitamin D deficiency, unspecified: Secondary | ICD-10-CM | POA: Diagnosis present

## 2018-10-13 DIAGNOSIS — K219 Gastro-esophageal reflux disease without esophagitis: Secondary | ICD-10-CM | POA: Diagnosis present

## 2018-10-13 DIAGNOSIS — F333 Major depressive disorder, recurrent, severe with psychotic symptoms: Secondary | ICD-10-CM | POA: Diagnosis present

## 2018-10-13 DIAGNOSIS — E119 Type 2 diabetes mellitus without complications: Secondary | ICD-10-CM | POA: Diagnosis present

## 2018-10-13 DIAGNOSIS — Z79891 Long term (current) use of opiate analgesic: Secondary | ICD-10-CM

## 2018-10-13 DIAGNOSIS — J3089 Other allergic rhinitis: Secondary | ICD-10-CM | POA: Diagnosis present

## 2018-10-13 DIAGNOSIS — F329 Major depressive disorder, single episode, unspecified: Secondary | ICD-10-CM | POA: Diagnosis present

## 2018-10-13 DIAGNOSIS — F17220 Nicotine dependence, chewing tobacco, uncomplicated: Secondary | ICD-10-CM | POA: Diagnosis present

## 2018-10-13 DIAGNOSIS — I10 Essential (primary) hypertension: Secondary | ICD-10-CM | POA: Diagnosis present

## 2018-10-13 DIAGNOSIS — J301 Allergic rhinitis due to pollen: Secondary | ICD-10-CM | POA: Diagnosis present

## 2018-10-13 DIAGNOSIS — Z881 Allergy status to other antibiotic agents status: Secondary | ICD-10-CM | POA: Diagnosis not present

## 2018-10-13 DIAGNOSIS — E785 Hyperlipidemia, unspecified: Secondary | ICD-10-CM | POA: Diagnosis present

## 2018-10-13 DIAGNOSIS — Z7984 Long term (current) use of oral hypoglycemic drugs: Secondary | ICD-10-CM

## 2018-10-13 LAB — CBG MONITORING, ED: Glucose-Capillary: 117 mg/dL — ABNORMAL HIGH (ref 70–99)

## 2018-10-13 MED ORDER — BUSPIRONE HCL 15 MG PO TABS
15.0000 mg | ORAL_TABLET | Freq: Three times a day (TID) | ORAL | Status: DC
  Administered 2018-10-13 – 2018-10-15 (×7): 15 mg via ORAL
  Filled 2018-10-13 (×13): qty 1

## 2018-10-13 MED ORDER — HYDROXYZINE HCL 25 MG PO TABS
25.0000 mg | ORAL_TABLET | Freq: Three times a day (TID) | ORAL | Status: DC | PRN
  Administered 2018-10-13 – 2018-10-14 (×2): 25 mg via ORAL
  Filled 2018-10-13 (×2): qty 1

## 2018-10-13 MED ORDER — LACTULOSE 10 GM/15ML PO SOLN: 10.0000 g | Freq: Every day | ORAL | Status: DC | PRN

## 2018-10-13 MED ORDER — ONDANSETRON 4 MG PO TBDP
4.0000 mg | ORAL_TABLET | Freq: Once | ORAL | Status: AC
  Administered 2018-10-13: 4 mg via ORAL
  Filled 2018-10-13: qty 1

## 2018-10-13 MED ORDER — GLIPIZIDE ER 5 MG PO TB24
5.0000 mg | ORAL_TABLET | Freq: Every day | ORAL | Status: DC
  Administered 2018-10-13 – 2018-10-15 (×3): 5 mg via ORAL
  Filled 2018-10-13 (×5): qty 1

## 2018-10-13 MED ORDER — PANTOPRAZOLE SODIUM 40 MG PO TBEC
40.0000 mg | DELAYED_RELEASE_TABLET | Freq: Every day | ORAL | Status: DC
  Administered 2018-10-13 – 2018-10-15 (×3): 40 mg via ORAL
  Filled 2018-10-13 (×5): qty 1

## 2018-10-13 MED ORDER — CEPHALEXIN 500 MG PO CAPS
500.0000 mg | ORAL_CAPSULE | Freq: Two times a day (BID) | ORAL | Status: DC
  Administered 2018-10-13 – 2018-10-15 (×4): 500 mg via ORAL
  Filled 2018-10-13 (×6): qty 1
  Filled 2018-10-13 (×2): qty 10

## 2018-10-13 MED ORDER — AMLODIPINE BESYLATE 10 MG PO TABS
10.0000 mg | ORAL_TABLET | Freq: Every day | ORAL | Status: DC
  Administered 2018-10-13 – 2018-10-15 (×3): 10 mg via ORAL
  Filled 2018-10-13 (×5): qty 1

## 2018-10-13 MED ORDER — ASPIRIN EC 81 MG PO TBEC
81.0000 mg | DELAYED_RELEASE_TABLET | Freq: Every day | ORAL | Status: DC
  Administered 2018-10-13 – 2018-10-15 (×3): 81 mg via ORAL
  Filled 2018-10-13 (×5): qty 1

## 2018-10-13 MED ORDER — FLUOXETINE HCL 20 MG PO CAPS
40.0000 mg | ORAL_CAPSULE | Freq: Every day | ORAL | Status: DC
  Administered 2018-10-13 – 2018-10-15 (×3): 40 mg via ORAL
  Filled 2018-10-13 (×5): qty 2

## 2018-10-13 MED ORDER — TAMSULOSIN HCL 0.4 MG PO CAPS
0.4000 mg | ORAL_CAPSULE | Freq: Every day | ORAL | Status: DC
  Administered 2018-10-14 – 2018-10-15 (×2): 0.4 mg via ORAL
  Filled 2018-10-13 (×4): qty 1

## 2018-10-13 MED ORDER — DOCUSATE SODIUM 100 MG PO CAPS
100.0000 mg | ORAL_CAPSULE | Freq: Every day | ORAL | Status: DC
  Administered 2018-10-14 – 2018-10-15 (×2): 100 mg via ORAL
  Filled 2018-10-13 (×5): qty 1

## 2018-10-13 MED ORDER — TRAZODONE HCL 100 MG PO TABS
100.0000 mg | ORAL_TABLET | Freq: Every day | ORAL | Status: DC
  Administered 2018-10-13 – 2018-10-14 (×2): 100 mg via ORAL
  Filled 2018-10-13 (×5): qty 1

## 2018-10-13 MED ORDER — ONDANSETRON HCL 4 MG/2ML IJ SOLN
4.0000 mg | Freq: Once | INTRAMUSCULAR | Status: DC
  Filled 2018-10-13: qty 2

## 2018-10-13 MED ORDER — METFORMIN HCL 500 MG PO TABS
1000.0000 mg | ORAL_TABLET | Freq: Every day | ORAL | Status: DC
  Administered 2018-10-13 – 2018-10-15 (×3): 1000 mg via ORAL
  Filled 2018-10-13 (×5): qty 2

## 2018-10-13 MED ORDER — ATORVASTATIN CALCIUM 20 MG PO TABS
20.0000 mg | ORAL_TABLET | Freq: Every day | ORAL | Status: DC
  Administered 2018-10-13 – 2018-10-15 (×3): 20 mg via ORAL
  Filled 2018-10-13 (×5): qty 1

## 2018-10-13 MED ORDER — NICOTINE 21 MG/24HR TD PT24
21.0000 mg | MEDICATED_PATCH | Freq: Every day | TRANSDERMAL | Status: DC
  Administered 2018-10-13 – 2018-10-15 (×3): 21 mg via TRANSDERMAL
  Filled 2018-10-13 (×5): qty 1

## 2018-10-13 MED ORDER — FLUOXETINE HCL 20 MG PO CAPS
20.0000 mg | ORAL_CAPSULE | Freq: Every day | ORAL | Status: DC
  Filled 2018-10-13 (×2): qty 1

## 2018-10-13 MED ORDER — LORATADINE 10 MG PO TABS
10.0000 mg | ORAL_TABLET | Freq: Every day | ORAL | Status: DC
  Administered 2018-10-13 – 2018-10-15 (×3): 10 mg via ORAL
  Filled 2018-10-13 (×5): qty 1

## 2018-10-13 NOTE — Progress Notes (Signed)
D: Pt denies SI/HI/AVH. Pt is pleasant and cooperative. Pt visible on the unit , pt appeared to be getting used to the unit  A: Pt was offered support and encouragement. Pt was given scheduled medications. Pt was encourage to attend groups. Q 15 minute checks were done for safety.   R:Pt attends groups and interacts well with peers and staff. Pt is taking medication. Pt has no complaints.Pt receptive to treatment and safety maintained on unit.   Problem: Education: Goal: Mental status will improve Outcome: Progressing   Problem: Activity: Goal: Sleeping patterns will improve Outcome: Progressing

## 2018-10-13 NOTE — BHH Group Notes (Signed)
LCSW Group Therapy Note  10/13/2018 3:50 PM  Type of Therapy/Topic: Group Therapy: Feelings about Diagnosis  Participation Level: Minimal   Description of Group:  This group will allow patients to explore their thoughts and feelings about diagnoses they have received. Patients will be guided to explore their level of understanding and acceptance of these diagnoses. Facilitator will encourage patients to process their thoughts and feelings about the reactions of others to their diagnosis and will guide patients in identifying ways to discuss their diagnosis with significant others in their lives. This group will be process-oriented, with patients participating in exploration of their own experiences, giving and receiving support, and processing challenge from other group members.  Therapeutic Goals: 1. Patient will demonstrate understanding of diagnosis as evidenced by identifying two or more symptoms of the disorder 2. Patient will be able to express two feelings regarding the diagnosis 3. Patient will demonstrate their ability to communicate their needs through discussion and/or role play  Summary of Patient Progress: Patient sat quietly throughout group and listened to the discussion.    Therapeutic Modalities:  Cognitive Behavioral Therapy Brief Therapy Feelings Identification   Stephanie Acre, MSW, [place] Social Worker

## 2018-10-13 NOTE — Progress Notes (Signed)
[place] [address] Bed 2 Call report to  [phone] Dr. Jake Samples  Can come after 9am.

## 2018-10-13 NOTE — BHH Suicide Risk Assessment (Signed)
[facility] Admission Suicide Risk Assessment   Nursing information obtained from:  Patient Demographic factors:  Male, Caucasian Current Mental Status:  Suicidal ideation indicated by patient, Plan includes specific time, place, or method, Intention to act on suicide plan, Suicidal ideation indicated by others, Self-harm thoughts, Belief that plan would result in death, Suicide plan, Self-harm behaviors Loss Factors:  NA Historical Factors:  Prior suicide attempts Risk Reduction Factors:  Positive social support, Employed, Positive therapeutic relationship, Sense of responsibility to family, Living with another person, especially a relative  Total Time spent with patient: 45 minutes Principal Problem:  Diagnosis:  Active Problems:   MDD (major depressive disorder)  Subjective Data: Patient seems to been ruminating about the end of his marriage and there is no evidence for this it is an excessive worry that is almost delusional at this point can contract for safety here  Continued Clinical Symptoms:  Alcohol Use Disorder Identification Test Final Score (AUDIT): 0 The "Alcohol Use Disorders Identification Test", Guidelines for Use in Primary Care, Second Edition.  World Pharmacologist [facility]). Score between 0-7:  no or low risk or alcohol related problems. Score between 8-15:  moderate risk of alcohol related problems. Score between 16-19:  high risk of alcohol related problems. Score 20 or above:  warrants further diagnostic evaluation for alcohol dependence and treatment.   CLINICAL FACTORS:   Depression:   Severe   Discussed augmentation and escalation of Prozac   COGNITIVE FEATURES THAT CONTRIBUTE TO RISK:  Loss of executive function    SUICIDE RISK:   Minimal: No identifiable suicidal ideation.  Patients presenting with no risk factors but with morbid ruminations; may be classified as minimal risk based on the severity of the depressive symptoms  PLAN OF CARE: Mid seek  diagnostic clarity and stabilization with meds and cognitive therapy  I certify that inpatient services furnished can reasonably be expected to improve the patient's condition.   Johnn Hai, MD 10/13/2018, 12:06 PM

## 2018-10-13 NOTE — Progress Notes (Signed)
Admission note  Pt is a 58 yo male that presents voluntarily on 10/13/2018 from [facility] after ingesting an unknown amount of week killer. Pt presents with stomach pain and burning, worsening depression, anxiety, and self harm thoughts. When asked what the patient's stressors are, he only answers, "a lot of things". Pt states his marriage is fine. Pt denies financial trouble. Pt works for the city of [employer]. Pt states he lives in [place]. Pt lives with his wife. Pt denies past/present verbal/sexual/physical abuse. Pt denies alcohol/drug/Rx abuse. Pt states he uses chewing tobacco. Pt has a hx of NIDDM II. Pt's UDS was positive for amphetamines. Pt states he just saw his PCP, Dr. Gypsy Balsam of [facility], two weeks ago. Pt states he has already received the influenza vaccine and denied the pneumonia vaccine.   Consents signed, skin/belongings search completed and patient oriented to unit. Patient stable at this time. Patient given the opportunity to express concerns and ask questions. Patient given toiletries. Will continue to monitor.

## 2018-10-13 NOTE — Tx Team (Signed)
Initial Treatment Plan 10/13/2018 12:31 PM Travis [name] NJN:[id number]    PATIENT STRESSORS: Other: pt only states: "alot of things"   PATIENT STRENGTHS: Capable of independent living Communication skills Physical Health Supportive family/friends   PATIENT IDENTIFIED PROBLEMS: "my depression"  "stopping these thoughts"                   DISCHARGE CRITERIA:  Ability to meet basic life and health needs Adequate post-discharge living arrangements Improved stabilization in mood, thinking, and/or behavior Medical problems require only outpatient monitoring  PRELIMINARY DISCHARGE PLAN: Return to previous living arrangement  PATIENT/FAMILY INVOLVEMENT: This treatment plan has been presented to and reviewed with the patient, Travis [name].  The patient and family have been given the opportunity to ask questions and make suggestions.  Baron Sane, RN 10/13/2018, 12:31 PM

## 2018-10-14 LAB — GLUCOSE, CAPILLARY: Glucose-Capillary: 116 mg/dL — ABNORMAL HIGH (ref 70–99)

## 2018-10-14 NOTE — Progress Notes (Signed)
Recreation Therapy Notes  INPATIENT RECREATION THERAPY ASSESSMENT  Patient Details Name: Chidubem R Martinique MRN: [id number] DOB: [birth year] Today's Date: 10/14/2018       Information Obtained From: Patient  Able to Participate in Assessment/Interview: Yes  Patient Presentation: Alert  Reason for Admission (Per Patient): Suicidal Ideation  Patient Stressors: Family(Pt stated past family issues.)  Coping Skills:   Self-Injury, Sports, TV, Music, Exercise, Talk, Art, Prayer, Avoidance, Hot Bath/Shower  Leisure Interests (2+):  [place]  Frequency of Recreation/Participation: Other (Comment)(when the weather permits and it's not raining)  Awareness of Community Resources:  Yes  Community Resources:  Restaurants, Other (Comment)(Stores)  Current Use: Yes  If no, Barriers?:    Expressed Interest in [place]: No  [place] of Residence:  [place]  Patient Main Form of Transportation: Musician  Patient Strengths:  Good friend; Honest  Patient Identified Areas of Improvement:  Thoughts  Patient Goal for Hospitalization:  "get thoughts cleared up"  Current SI (including self-harm):  No  Current HI:  No  Current AVH: No  Staff Intervention Plan: Group Attendance, Collaborate with Interdisciplinary Treatment Team  Consent to Intern Participation: N/A    Victorino Sparrow, LRT/CTRS  Victorino Sparrow A 10/14/2018, 1:23 PM

## 2018-10-14 NOTE — Progress Notes (Signed)
Patient states that he attended all of his groups today. He is concerned about some paperwork that is located in his locker pertaining to jury duty. He states that the doctor must fill out his paperwork by Friday if he is not discharged over the next few days. His goal for tomorrow is to work on getting discharged from the hospital.

## 2018-10-14 NOTE — Therapy (Signed)
Occupational Therapy Group Note  Date:  10/14/2018 Time:  2:46 PM  Group Topic/Focus:  Leisure Group  Participation Level:  Active  Participation Quality:  Appropriate  Affect:  Flat  Cognitive:  Appropriate  Insight: Limited  Engagement in Group:  Engaged  Modes of Intervention:  Activity, Discussion, Education and Socialization  Additional Comments:    S: "I am good at cooking"  O: Leisure group completed this date by having pts engage in game of coping skills Uno. Pts to name the following per special card (one strength, one coping skill, current struggle, healthy choice, and one success). Discussion on these topics encouraged to further education and understanding.  A: Pt presents to group with flat affect, engaged and participatory throughout session. Pt shares he has never played uno before, did well following instruction and engaging in game. Throughout activity pt share he is a good cook, enjoys the support of his wife, and enjoys walking and being outdoors to manage himself. Noted slight affect improvement with few smiles at end of group.  P: OT treatment will be x1 per week while pt inpatient.   Zenovia Jarred, MSOT, OTR/L Behavioral Health OT/ Acute Relief OT PHP Office: [place] 10/14/2018, 2:46 PM

## 2018-10-14 NOTE — Tx Team (Addendum)
Interdisciplinary Treatment and Diagnostic Plan Update  10/15/2018 Time of Session: 1026 Travis [name] MRN: [id number]  Principal Diagnosis: <principal problem not specified>  Secondary Diagnoses: Active Problems:   MDD (major depressive disorder)   Current Medications:  Current Facility-Administered Medications  Medication Dose Route Frequency Provider Last Rate Last Dose  . amLODipine (NORVASC) tablet 10 mg  10 mg Oral Daily Lindon Romp A, NP   10 mg at 10/15/18 0804  . aspirin EC tablet 81 mg  81 mg Oral Daily Lindon Romp A, NP   81 mg at 10/15/18 9371  . atorvastatin (LIPITOR) tablet 20 mg  20 mg Oral Daily Lindon Romp A, NP   20 mg at 10/15/18 6967  . busPIRone (BUSPAR) tablet 15 mg  15 mg Oral TID Johnn Hai, MD   15 mg at 10/15/18 0804  . cephALEXin (KEFLEX) capsule 500 mg  500 mg Oral BID Lindon Romp A, NP   500 mg at 10/15/18 0803  . docusate sodium (COLACE) capsule 100 mg  100 mg Oral Daily Lindon Romp A, NP   100 mg at 10/15/18 0804  . FLUoxetine (PROZAC) capsule 40 mg  40 mg Oral Daily Johnn Hai, MD   40 mg at 10/15/18 0803  . glipiZIDE (GLUCOTROL XL) 24 hr tablet 5 mg  5 mg Oral Daily Lindon Romp A, NP   5 mg at 10/15/18 0804  . hydrOXYzine (ATARAX/VISTARIL) tablet 25 mg  25 mg Oral TID PRN Rozetta Nunnery, NP   25 mg at 10/14/18 2151  . lactulose (CHRONULAC) 10 GM/15ML solution 10 g  10 g Oral Daily PRN Lindon Romp A, NP      . loratadine (CLARITIN) tablet 10 mg  10 mg Oral Daily Lindon Romp A, NP   10 mg at 10/15/18 0803  . metFORMIN (GLUCOPHAGE) tablet 1,000 mg  1,000 mg Oral Daily Lindon Romp A, NP   1,000 mg at 10/15/18 0804  . nicotine (NICODERM CQ - dosed in mg/24 hours) patch 21 mg  21 mg Transdermal Daily Johnn Hai, MD   21 mg at 10/15/18 0804  . pantoprazole (PROTONIX) EC tablet 40 mg  40 mg Oral Daily Lindon Romp A, NP   40 mg at 10/15/18 0804  . tamsulosin (FLOMAX) capsule 0.4 mg  0.4 mg Oral QPC breakfast Lindon Romp A, NP   0.4 mg at  10/15/18 0804  . traZODone (DESYREL) tablet 100 mg  100 mg Oral QHS Lindon Romp A, NP   100 mg at 10/14/18 2151   PTA Medications: Facility-Administered Medications Prior to Admission  Medication Dose Route Frequency Provider Last Rate Last Dose  . 0.9 %  sodium chloride infusion  500 mL Intravenous Once Nandigam, Venia Minks, MD       Medications Prior to Admission  Medication Sig Dispense Refill Last Dose  . acetaminophen (TYLENOL) 325 MG tablet Take 2 tablets (650 mg total) by mouth every 6 (six) hours as needed for mild pain. (Patient not taking: Reported on 10/12/2018)   Not Taking at Unknown time  . aspirin EC 81 MG tablet Take 1 tablet (81 mg total) by mouth daily. For heart health   Past Month at Unknown time  . chlorhexidine (PERIDEX) 0.12 % solution Use as directed 15 mLs in the mouth or throat 2 (two) times daily. For dental problems. (Patient not taking: Reported on 12/18/2017) 120 mL 0 Not Taking at Unknown time  . FLUoxetine (PROZAC) 20 MG capsule Take 1 capsule (20 mg total) by  mouth daily. For depression 30 capsule 0 10/11/2018 at Unknown time  . glipiZIDE (GLUCOTROL XL) 10 MG 24 hr tablet Take 1 tablet (10 mg total) by mouth daily. For diabetes management (Patient taking differently: Take 5 mg by mouth daily. For diabetes management)   10/11/2018 at Unknown time  . HYDROcodone-acetaminophen (NORCO/VICODIN) 5-325 MG tablet Take 0.5 tablets by mouth every evening.   0 10/11/2018 at Unknown time  . indomethacin (INDOCIN) 25 MG capsule Take 1 capsule by mouth 2 (two) times daily.   Not Taking at Unknown time  . lactulose (CHRONULAC) 10 GM/15ML solution Take 10 g by mouth daily as needed.    unknown  . lidocaine (LIDODERM) 5 % Place 1 patch onto the skin daily. Remove & Discard patch within 12 hours or as directed by MD: For pain management (Patient not taking: Reported on 12/18/2017) 7 patch 0 Not Taking at Unknown time  . omeprazole (PRILOSEC) 20 MG capsule Take 20 mg by mouth daily.    10/11/2018 at Unknown time  . ondansetron (ZOFRAN) 4 MG tablet Take 4 mg by mouth once.   0 10/11/2018 at Unknown time  . polyethylene glycol (MIRALAX / GLYCOLAX) packet Take 17 g by mouth 2 (two) times daily. For constipation (Patient taking differently: Take 17 g by mouth daily as needed for mild constipation. For constipation) 14 each 0 unknown  . risperiDONE (RISPERDAL) 0.25 MG tablet Take 0.25 mg by mouth daily.  11 Not Taking at Unknown time  . senna-docusate (SENOKOT-S) 8.6-50 MG tablet Take 1 tablet by mouth 2 (two) times daily. For constipation (Patient taking differently: Take 1 tablet by mouth daily. For constipation)   10/11/2018 at Unknown time  . [DISCONTINUED] amLODipine (NORVASC) 10 MG tablet Take 1 tablet (10 mg total) by mouth daily. For high blood pressure 10 tablet 0 10/11/2018 at Unknown time  . [DISCONTINUED] atorvastatin (LIPITOR) 20 MG tablet Take 20 mg by mouth daily.  11 Past Month at Unknown time  . [DISCONTINUED] cephALEXin (KEFLEX) 500 MG capsule Take 1 capsule (500 mg total) by mouth 2 (two) times daily. 10 capsule 0 10/11/2018 at Unknown time  . [DISCONTINUED] docusate sodium (COLACE) 100 MG capsule Take 100 mg by mouth daily.   10/11/2018 at Unknown time  . [DISCONTINUED] hydrOXYzine (ATARAX/VISTARIL) 25 MG tablet Take 1 tablet (25 mg total) by mouth 3 (three) times daily as needed for anxiety. 60 tablet 0 unknown  . [DISCONTINUED] loratadine (CLARITIN) 10 MG tablet Take 1 tablet (10 mg total) by mouth daily. For allergies   unknown at Unknown time  . [DISCONTINUED] metFORMIN (GLUCOPHAGE) 1000 MG tablet Take 1 tablet (1,000 mg total) by mouth daily. For diabetes management   10/11/2018 at Unknown time  . [DISCONTINUED] nitroGLYCERIN (NITROSTAT) 0.4 MG SL tablet Place 1 tablet (0.4 mg total) under the tongue every 5 (five) minutes as needed for chest pain.  12 unknown  . [DISCONTINUED] pantoprazole (PROTONIX) 40 MG tablet Take 1 tablet (40 mg total) by mouth daily. For acid  reflux 1 tablet 0 10/11/2018 at Unknown time  . [DISCONTINUED] tamsulosin (FLOMAX) 0.4 MG CAPS capsule Take 1 capsule (0.4 mg total) by mouth daily after breakfast. For prostate health 1 capsule 0 10/11/2018 at Unknown time  . [DISCONTINUED] traZODone (DESYREL) 100 MG tablet Take 1 tablet (100 mg total) by mouth at bedtime. For sleep 30 tablet 0 10/10/2018 at Unknown time    Patient Stressors: Other: pt only states: "alot of things"  Patient Strengths: Capable of independent living  Communication skills Physical Health Supportive family/friends  Treatment Modalities: Medication Management, Group therapy, Case management,  1 to 1 session with clinician, Psychoeducation, Recreational therapy.   Physician Treatment Plan for Primary Diagnosis: <principal problem not specified> Long Term Goal(s): Improvement in symptoms so as ready for discharge Improvement in symptoms so as ready for discharge   Short Term Goals: Ability to identify and develop effective coping behaviors will improve Ability to maintain clinical measurements within normal limits will improve Ability to identify and develop effective coping behaviors will improve Ability to identify triggers associated with substance abuse/mental health issues will improve  Medication Management: Evaluate patient's response, side effects, and tolerance of medication regimen.  Therapeutic Interventions: 1 to 1 sessions, Unit Group sessions and Medication administration.  Evaluation of Outcomes: Not Met  Physician Treatment Plan for Secondary Diagnosis: Active Problems:   MDD (major depressive disorder)  Long Term Goal(s): Improvement in symptoms so as ready for discharge Improvement in symptoms so as ready for discharge   Short Term Goals: Ability to identify and develop effective coping behaviors will improve Ability to maintain clinical measurements within normal limits will improve Ability to identify and develop effective coping  behaviors will improve Ability to identify triggers associated with substance abuse/mental health issues will improve     Medication Management: Evaluate patient's response, side effects, and tolerance of medication regimen.  Therapeutic Interventions: 1 to 1 sessions, Unit Group sessions and Medication administration.  Evaluation of Outcomes: Not Met   RN Treatment Plan for Primary Diagnosis: <principal problem not specified> Long Term Goal(s): Knowledge of disease and therapeutic regimen to maintain health will improve  Short Term Goals: Ability to identify and develop effective coping behaviors will improve and Compliance with prescribed medications will improve  Medication Management: RN will administer medications as ordered by provider, will assess and evaluate patient's response and provide education to patient for prescribed medication. RN will report any adverse and/or side effects to prescribing provider.  Therapeutic Interventions: 1 on 1 counseling sessions, Psychoeducation, Medication administration, Evaluate responses to treatment, Monitor vital signs and CBGs as ordered, Perform/monitor CIWA, COWS, AIMS and Fall Risk screenings as ordered, Perform wound care treatments as ordered.  Evaluation of Outcomes: Not Met   LCSW Treatment Plan for Primary Diagnosis: <principal problem not specified> Long Term Goal(s): Safe transition to appropriate next level of care at discharge, Engage patient in therapeutic group addressing interpersonal concerns.  Short Term Goals: Engage patient in aftercare planning with referrals and resources, Increase social support and Increase skills for wellness and recovery  Therapeutic Interventions: Assess for all discharge needs, 1 to 1 time with Social worker, Explore available resources and support systems, Assess for adequacy in community support network, Educate family and significant other(s) on suicide prevention, Complete Psychosocial  Assessment, Interpersonal group therapy.  Evaluation of Outcomes: Not Met   Progress in Treatment: Attending groups: Yes. Participating in groups: Yes. Taking medication as prescribed: Yes. Toleration medication: Yes. Family/Significant other contact made: No, will contact:  when given permission Patient understands diagnosis: Yes. Discussing patient identified problems/goals with staff: Yes. Medical problems stabilized or resolved: Yes. Denies suicidal/homicidal ideation: Yes. Issues/concerns per patient self-inventory: No. Other: none  New problem(s) identified: No, Describe:  none  New Short Term/Long Term Goal(s):  Patient Goals:  "get stuff out of my head"  Discharge Plan or Barriers:   Reason for Continuation of Hospitalization: Delusions  Medication stabilization  Estimated Length of Stay: 3-5 days.  Attendees: Patient: Travis [name] 10/14/2018   Physician:  Dr Jake Samples, MD 10/14/2018   Nursing: Boyce Medici, RN 10/14/2018   RN Care Manager: 10/14/2018   Social Worker: Lurline Idol, LCSW 10/14/2018   Recreational Therapist:  10/14/2018   Other:  10/14/2018   Other:  10/14/2018   Other: 10/14/2018        Scribe for Treatment Team: Joanne Chars, [place] 10/15/2018 11:23 AM

## 2018-10-14 NOTE — Progress Notes (Signed)
D: Pt denies SI/HI/AVH. Pt is pleasant and cooperative. Pt stated he was feeling better. Pt concerned about his jury duty Monday.   A: Pt was offered support and encouragement. Pt was given scheduled medications. Pt was encourage to attend groups. Q 15 minute checks were done for safety.   R:Pt attends groups and interacts well with peers and staff. Pt is taking medication. Pt has no complaints.Pt receptive to treatment and safety maintained on unit.   Problem: Education: Goal: Emotional status will improve Outcome: Progressing   Problem: Education: Goal: Mental status will improve Outcome: Progressing

## 2018-10-14 NOTE — Progress Notes (Signed)
[facility] MD Progress Note  10/14/2018 1:06 PM Travis [name]  MRN:  [id number] Subjective:    Patient confirmed his goal is to "get bad thoughts" out of his head he is less obsessive and again has no reality basis for concerns about his marriage.  He is alert and oriented affect still constricted denies wanting to harm himself today wants to be sure he can be discharged by Monday due to a court date  Probable discharge tomorrow continue escalated dose of fluoxetine continue cognitive therapy Principal Problem: Depression with poor coping skills leading to some degree of suicidal thinking resolving now Diagnosis: Active Problems:   MDD (major depressive disorder)  Total Time spent with patient: 20 minutes  Past Medical History:  Past Medical History:  Diagnosis Date  . Dyslipidemia   . Fatigue    NAUSEA,BLOATING  . H. pylori infection 09/15/2006  . Hematochezia 08/30/2007  . History of nuclear stress test 04/03/2009   exercise myoview; normal pattern of perfusion; low risk scan   . Hypertension    treated  . NIDDM (non-insulin dependent diabetes mellitus) 2011   type 2  . Schizoaffective disorder ([place])   . Tobacco chew use   . Unspecified vitamin D deficiency     Past Surgical History:  Procedure Laterality Date  . APPENDECTOMY    . HERNIA REPAIR     RIGHT  . KNEE SURGERY     BOTH  . TRANSTHORACIC ECHOCARDIOGRAM  04/03/2009   EF=>55%; trace TR, mild pulm regurg   Family History:  Family History  Problem Relation Age of Onset  . Stroke Father 52  . Pneumonia Father   . Heart failure Mother 44  . Coronary artery disease Mother        also MI  . Heart attack Mother   . Heart attack Maternal Uncle        died in 29s  . Cancer Other        x2; maternal side  . Diabetes Maternal Grandmother     Social History:  Social History   Substance and Sexual Activity  Alcohol Use No  . Alcohol/week: 0.0 standard drinks     Social History   Substance and Sexual Activity   Drug Use No    Social History   Socioeconomic History  . Marital status: Married    Spouse name: Not on file  . Number of children: 2  . Years of education: Not on file  . Highest education level: Not on file  Occupational History    Employer: UNEMPLOYED  Social Needs  . Financial resource strain: Not on file  . Food insecurity:    Worry: Not on file    Inability: Not on file  . Transportation needs:    Medical: Not on file    Non-medical: Not on file  Tobacco Use  . Smoking status: Never Smoker  . Smokeless tobacco: Current User    Types: Snuff, Chew  . Tobacco comment: started chewing tobacco at age 76 years old-stopped chewing and dips snuff only  Substance and Sexual Activity  . Alcohol use: No    Alcohol/week: 0.0 standard drinks  . Drug use: No  . Sexual activity: Not Currently  Lifestyle  . Physical activity:    Days per week: Not on file    Minutes per session: Not on file  . Stress: Not on file  Relationships  . Social connections:    Talks on phone: Not on file    Gets  together: Not on file    Attends religious service: Not on file    Active member of club or organization: Not on file    Attends meetings of clubs or organizations: Not on file    Relationship status: Not on file  Other Topics Concern  . Not on file  Social History Narrative  . Not on file   Additional Social History:                         Sleep: Good  Appetite: nl  Current Medications: Current Facility-Administered Medications  Medication Dose Route Frequency Provider Last Rate Last Dose  . amLODipine (NORVASC) tablet 10 mg  10 mg Oral Daily Lindon Romp A, NP   10 mg at 10/14/18 0757  . aspirin EC tablet 81 mg  81 mg Oral Daily Lindon Romp A, NP   81 mg at 10/14/18 0754  . atorvastatin (LIPITOR) tablet 20 mg  20 mg Oral Daily Lindon Romp A, NP   20 mg at 10/14/18 0755  . busPIRone (BUSPAR) tablet 15 mg  15 mg Oral TID Johnn Hai, MD   15 mg at 10/14/18 1143  .  cephALEXin (KEFLEX) capsule 500 mg  500 mg Oral BID Lindon Romp A, NP   500 mg at 10/14/18 0754  . docusate sodium (COLACE) capsule 100 mg  100 mg Oral Daily Lindon Romp A, NP   100 mg at 10/14/18 0754  . FLUoxetine (PROZAC) capsule 40 mg  40 mg Oral Daily Johnn Hai, MD   40 mg at 10/14/18 0754  . glipiZIDE (GLUCOTROL XL) 24 hr tablet 5 mg  5 mg Oral Daily Lindon Romp A, NP   5 mg at 10/14/18 0754  . hydrOXYzine (ATARAX/VISTARIL) tablet 25 mg  25 mg Oral TID PRN Lindon Romp A, NP   25 mg at 10/13/18 2059  . lactulose (CHRONULAC) 10 GM/15ML solution 10 g  10 g Oral Daily PRN Lindon Romp A, NP      . loratadine (CLARITIN) tablet 10 mg  10 mg Oral Daily Lindon Romp A, NP   10 mg at 10/14/18 0756  . metFORMIN (GLUCOPHAGE) tablet 1,000 mg  1,000 mg Oral Daily Lindon Romp A, NP   1,000 mg at 10/14/18 0754  . nicotine (NICODERM CQ - dosed in mg/24 hours) patch 21 mg  21 mg Transdermal Daily Johnn Hai, MD   21 mg at 10/14/18 0803  . pantoprazole (PROTONIX) EC tablet 40 mg  40 mg Oral Daily Lindon Romp A, NP   40 mg at 10/14/18 0755  . tamsulosin (FLOMAX) capsule 0.4 mg  0.4 mg Oral QPC breakfast Lindon Romp A, NP   0.4 mg at 10/14/18 1003  . traZODone (DESYREL) tablet 100 mg  100 mg Oral QHS Lindon Romp A, NP   100 mg at 10/13/18 2059    Lab Results:  Results for orders placed or performed during the hospital encounter of 10/13/18 (from the past 48 hour(s))  Glucose, capillary     Status: Abnormal   Collection Time: 10/14/18  6:11 AM  Result Value Ref Range   Glucose-Capillary 116 (H) 70 - 99 mg/dL    Blood Alcohol level:  Lab Results  Component Value Date   ETH <10 10/12/2018   ETH <5 16/05/9603    Metabolic Disorder Labs: Lab Results  Component Value Date   HGBA1C 6.3 (H) 04/16/2017   MPG 134.11 04/16/2017   No results found for: PROLACTIN Lab  Results  Component Value Date   CHOL 152 04/16/2017   TRIG 209 (H) 04/16/2017   HDL 23 (L) 04/16/2017   CHOLHDL 6.6  04/16/2017   VLDL 42 (H) 04/16/2017   LDLCALC 87 04/16/2017    Physical Findings: AIMS: Facial and Oral Movements Muscles of Facial Expression: None, normal Lips and Perioral Area: None, normal Jaw: None, normal Tongue: None, normal,Extremity Movements Upper (arms, wrists, hands, fingers): None, normal Lower (legs, knees, ankles, toes): None, normal, Trunk Movements Neck, shoulders, hips: None, normal, Overall Severity Severity of abnormal movements (highest score from questions above): None, normal Incapacitation due to abnormal movements: None, normal Patient's awareness of abnormal movements (rate only patient's report): No Awareness, Dental Status Current problems with teeth and/or dentures?: No Does patient usually wear dentures?: No  CIWA:    COWS:     Musculoskeletal: Strength & Muscle Tone: within normal limits Gait & Station: normal Patient leans: N/A  Psychiatric Specialty Exam: Physical Exam  ROS  Blood pressure 122/88, pulse 83, temperature 97.8 F (36.6 C), temperature source Oral, resp. rate 18, height 5\' 5"  (1.651 m), weight 78.9 kg, SpO2 96 %.Body mass index is 28.96 kg/m.  General Appearance: Guarded  Eye Contact:  Good  Speech:  Clear and Coherent  Volume:  Decreased  Mood:  Dysphoric  Affect:  Appropriate  Thought Process:  Coherent  Orientation:  Full (Time, Place, and Person)  Thought Content:  Logical  Suicidal Thoughts:  No  Homicidal Thoughts:  No  Memory:  Immediate;   Fair  Judgement:  Fair  Insight:  Fair  Psychomotor Activity:  Normal  Concentration:  Concentration: Fair  Recall:  [employer] of Knowledge:  Fair  Language:  Good  Akathisia:  Negative  Handed:  Right  AIMS (if indicated):     Assets:  Resilience  ADL's:  Intact  Cognition:  WNL  Sleep:  Number of Hours: 6.25     Treatment Plan Summary: Daily contact with patient to assess and evaluate symptoms and progress in treatment and Medication management continue current  cognitive therapy and medications no change in meds  Natayla Cadenhead, MD 10/14/2018, 1:06 PM

## 2018-10-14 NOTE — Progress Notes (Signed)
Recreation Therapy Notes  Date: 2.19. 20 Time: 1000 Location: [address] Dayroom  Group Topic:  Goal Planning  Goal Area(s) Addresses:  Patient will be able to identify at least 3 goals for immediate future.  Patient will be able to identify benefit of investing in goals.  Patient will be able to identify benefit of setting goals.   Intervention: Worksheet, pencils  Activity: Goal Planning.  Patients were to set goals they hope to accomplish within the next week, month, year and five years.  Patients were to then identify any obstacles that would prevent them from reaching their goals, identify what they need to reach their goals and what they can start doing now to work towards their goals.  Education:  Discharge Planning, Radiographer, therapeutic, Leisure Education   Education Outcome: Acknowledges Education/In Group Clarification Provided/Needs Additional Education  Clinical Observations:  Pt did not attend group.    Victorino Sparrow, LRT/CTRS         Victorino Sparrow A 10/14/2018 11:36 AM

## 2018-10-14 NOTE — Plan of Care (Signed)
  Problem: Coping: Goal: Ability to verbalize frustrations and anger appropriately will improve 10/14/2018 1535 by Harriet Masson, RN Outcome: Progressing Goal: Ability to demonstrate self-control will improve Outcome: Progressing   D: Pt alert and oriented on the unit. Pt denies SI/HI, A/VH. Pt's affect was flat and mood sad and depressed. Pt also participated during unit groups and activities. Pt is pleasant and cooperative. A: Education, support and encouragement provided, q15 minute safety checks remain in effect. Medications administered per MD orders. R: No reactions/side effects to medicine noted. Pt denies any concerns at this time, and verbally contracts for safety. Pt ambulating on the unit with no issues. Pt remains safe on and off the unit.

## 2018-10-14 NOTE — BHH Counselor (Signed)
Adult Comprehensive Assessment  Patient ID: Travis [name], male   DOB: May 26, 1961, 58 y.o.   MRN: [id number]  Information Source: Information source: Patient  Current Stressors:  Patient states their primary concerns and needs for treatment are:: Travis [name] reported he became so depressed he did not want to live without his wife Patient states their goals for this hospitilization and ongoing recovery are:: "Get my thoughts right" Family Relationships: For the past year he had become worried about his marriage which became suicidal  Living/Environment/Situation:  Living Arrangements: Spouse/significant other Living conditions (as described by patient or guardian): He lives in a house Who else lives in the home?: His spouse Travis [name] How long has patient lived in current situation?: 6years What is atmosphere in 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, Quarry manager  Family History:  Marital status: Married Number of Years Married: 6 What types of issues is patient dealing with in the relationship?: "He's a quiet kind of person" Additional relationship information: Pt had never told her about previous hospitalizations Are you sexually active?: Yes What is your sexual orientation?: straight Does patient have children?: No  Childhood History:  By whom was/is the patient raised?: Both parents Patient's description of current relationship with people who raised him/her: both parents deceased-mother died of heart attack-father had dementia Does patient have siblings?: Yes Number of Siblings: 3 Description of patient's current relationship with siblings: 1 sister estranged  1 sister died  Did patient suffer any verbal/emotional/physical/sexual abuse as a child?: No Did patient suffer from severe childhood neglect?: No Was the patient ever a victim of a crime or a disaster?: No Witnessed domestic violence?: No Has patient been effected by domestic violence as an adult?: No  Education:  Highest grade of  school patient has completed: 12th grade Currently a student?: No Name of school: NA Learning disability?: No  Employment/Work Situation:   Employment situation: Employed Where is patient currently employed?: 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 How long has patient been employed?: 6 years Patient's job has been impacted by current illness: NoWhat is the longest time patient has a held a job?: 30 years Where was the patient employed at that time?: factory Are There Guns or Chiropractor in [place]?: Yes Types of Guns/Weapons: A black powder gun that his father gave him Are These Weapons Safely Secured?: Yes(He stated it is behind a cabinet that he cannot get to )  Financial Resources:   Financial resources: Income from employment, Private insurance Does patient have a representative payee or guardian?: No  Alcohol/Substance Abuse:   What has been your use of drugs/alcohol within the last 12 months?: Denies history If attempted suicide, did drugs/alcohol play a role in this?: No If yes, describe treatment: Chews tobacco 1 can/day, late morning throught the day Has alcohol/substance abuse ever caused legal problems?: No  Social Support System:   Pensions consultant Support System: [place]: Wife and sister-in-law Type of faith/religion: Faith in God How does patient's faith help to cope with current illness?: "He looks after me"  Leisure/Recreation:   Leisure and Hobbies: Insurance underwriter and activities with his wife like shopping and out to eat  Strengths/Needs:   What is the patient's perception of their strengths?: "I am good with people, and good to everybody" Patient states they can use these personal strengths during their treatment to contribute to their recovery: "Yes" Patient states these barriers may affect/interfere with their treatment: None reported Patient states these barriers may affect their return to the  community: None  reported Other important information patient would like considered in planning for their treatment: None reported  Discharge Plan:   Currently receiving community mental health services: Yes (From Whom)([facility] in [place]) Patient states concerns and preferences for aftercare planning are: Follow up [facility] wih doctor at [facility] Patient states they will know when they are safe and ready for discharge when: "When my thoughts feels good about it.Marland KitchenMarland KitchenMarland KitchenBe sure that I do not have those thoughts" Does patient have access to transportation?: Yes Does patient have financial barriers related to discharge medications?: No Patient description of barriers related to discharge medications: None reported Will patient be returning to same living situation after discharge?: Yes  Summary/Recommendations:   Summary and Recommendations (to be completed by the evaluator): Siddiq [name] is a 58 yo Caucasian male. He presented voluntarily with symptoms of severe depression and attempted suicide. Percy has a doctor at [facility] that he will continue to see. While here, Benny may benefit from crises stabilization, medication management, a therapeutic milieu and referral for services.Travis [name], MSW Intern [place] Department 10/14/2018

## 2018-10-14 NOTE — BHH Suicide Risk Assessment (Signed)
[place] INPATIENT:  Family/Significant Other Suicide Prevention Education  Suicide Prevention Education:  Contact Attempts: wife Melinda Martinique [phone], has been identified by the patient as the family member/significant other with whom the patient will be residing, and identified as the person(s) who will aid the patient in the event of a mental health crisis.  With written consent from the patient, two attempts were made to provide suicide prevention education, prior to and/or following the patient's discharge.  We were unsuccessful in providing suicide prevention education.  A suicide education pamphlet was given to the patient to share with family/significant other.  Date and time of first attempt: 10/14/2018 3:15 PM     [name], MSW Intern [place] Department 10/14/2018, 3:16 PM

## 2018-10-15 LAB — GLUCOSE, CAPILLARY: Glucose-Capillary: 155 mg/dL — ABNORMAL HIGH (ref 70–99)

## 2018-10-15 MED ORDER — AMLODIPINE BESYLATE 10 MG PO TABS: 10.0000 mg | ORAL_TABLET | Freq: Every day | ORAL | 0 refills | Status: DC

## 2018-10-15 MED ORDER — NITROGLYCERIN 0.4 MG SL SUBL: 0.4000 mg | SUBLINGUAL_TABLET | SUBLINGUAL | 0 refills | Status: AC | PRN

## 2018-10-15 MED ORDER — TAMSULOSIN HCL 0.4 MG PO CAPS: 0.4000 mg | ORAL_CAPSULE | Freq: Every day | ORAL | 0 refills | Status: AC

## 2018-10-15 MED ORDER — LORATADINE 10 MG PO TABS: 10.0000 mg | ORAL_TABLET | Freq: Every day | ORAL | Status: DC

## 2018-10-15 MED ORDER — METFORMIN HCL 1000 MG PO TABS: 1000.0000 mg | ORAL_TABLET | Freq: Every day | ORAL | 0 refills | Status: DC

## 2018-10-15 MED ORDER — NICOTINE 21 MG/24HR TD PT24: 21.0000 mg | MEDICATED_PATCH | Freq: Every day | TRANSDERMAL | 0 refills | Status: DC

## 2018-10-15 MED ORDER — BUSPIRONE HCL 15 MG PO TABS: 15.0000 mg | ORAL_TABLET | Freq: Three times a day (TID) | ORAL | 0 refills | Status: DC

## 2018-10-15 MED ORDER — ASPIRIN 81 MG PO TBEC: 81.0000 mg | DELAYED_RELEASE_TABLET | Freq: Every day | ORAL | 0 refills | Status: DC

## 2018-10-15 MED ORDER — TRAZODONE HCL 100 MG PO TABS: 100.0000 mg | ORAL_TABLET | Freq: Every day | ORAL | 0 refills | Status: AC

## 2018-10-15 MED ORDER — HYDROXYZINE HCL 25 MG PO TABS: 25.0000 mg | ORAL_TABLET | Freq: Three times a day (TID) | ORAL | 0 refills | Status: DC | PRN

## 2018-10-15 MED ORDER — GLIPIZIDE ER 5 MG PO TB24: 5.0000 mg | ORAL_TABLET | Freq: Every day | ORAL | 0 refills | Status: DC

## 2018-10-15 MED ORDER — CEPHALEXIN 500 MG PO CAPS: 500.0000 mg | ORAL_CAPSULE | Freq: Two times a day (BID) | ORAL | 0 refills | Status: DC

## 2018-10-15 MED ORDER — ATORVASTATIN CALCIUM 20 MG PO TABS: 20.0000 mg | ORAL_TABLET | Freq: Every day | ORAL | 0 refills | Status: AC

## 2018-10-15 MED ORDER — DOCUSATE SODIUM 100 MG PO CAPS: 100.0000 mg | ORAL_CAPSULE | Freq: Every day | ORAL | 0 refills | Status: DC

## 2018-10-15 MED ORDER — FLUOXETINE HCL 40 MG PO CAPS: 40.0000 mg | ORAL_CAPSULE | Freq: Every day | ORAL | 0 refills | Status: DC

## 2018-10-15 MED ORDER — PANTOPRAZOLE SODIUM 40 MG PO TBEC: 40.0000 mg | DELAYED_RELEASE_TABLET | Freq: Every day | ORAL | 0 refills | Status: DC

## 2018-10-15 NOTE — BHH Counselor (Signed)
Travis [name] requested CSW cancel the [name] follow up appointment 10/19/2018. He asked CSW for follow up with new provider [name] 10/19/2018.  [name], MSW Intern 10/15/2018 10:30 AM

## 2018-10-15 NOTE — Discharge Summary (Signed)
Physician Discharge Summary Note  Patient:  Travis [name] is an 58 y.o., male  MRN:  [id number]  DOB:  1960/10/03  Patient phone:  [phone] (home)   Patient address:   [place] [address],   Total Time spent with patient: Greater than 30 minutes  Date of Admission:  10/13/2018  Date of Discharge: 10/15/18  Reason for Admission: Delusional thoughts & self-harm behaviors.  Principal Problem: Major depressive disorder, recurrent episode, severe, with psychosis [facility])  Discharge Diagnoses: Patient Active Problem List   Diagnosis Date Noted  . Major depressive disorder, recurrent episode, severe, with psychosis ([place]) [F33.3] 04/19/2017    Priority: High  . MDD (major depressive disorder) [F32.9] 04/16/2017  . AKI (acute kidney injury) ([place]) [N17.9]   . Lactic acid acidosis [E87.2]   . Left sided numbness [R20.0]   . Sepsis ([place]) [A41.9]   . Elevated lactic acid level [R79.89]   . Leukocytosis [D72.829]   . Volume depletion [E86.9]   . Hypotension [I95.9] 03/13/2017  . Chest pain with moderate risk for cardiac etiology [R07.9] 01/13/2014  . Dyspnea [R06.00] 01/13/2014  . Family history of coronary artery disease- (mother had MI 65) [Z82.49] 01/13/2014  . Tobacco chew use [Z72.0]   . Type 2 diabetes mellitus ([place]) [E11.9]   . Dyslipidemia [E78.5]   . Unspecified vitamin D deficiency [E55.9]   . 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 Esophageal reflux [K21.9] 09/28/2007  . DYSPHAGIA UNSPECIFIED [R13.10] 09/28/2007  . BLOOD IN STOOL, OCCULT [R19.5] 09/28/2007  . HERNIORRHAPHY, HX OF [Z98.89] 09/28/2007   Past Psychiatric History: See H&P  Past Medical History:  Past Medical History:  Diagnosis Date  . Dyslipidemia   . Fatigue    NAUSEA,BLOATING  . H. pylori infection 09/15/2006  . Hematochezia 08/30/2007  . History of nuclear stress test 04/03/2009   exercise myoview; normal pattern of perfusion; low risk scan   .  Hypertension    treated  . NIDDM (non-insulin dependent diabetes mellitus) 2011   type 2  . Schizoaffective disorder ([place])   . Tobacco chew use   . Unspecified vitamin D deficiency     Past Surgical History:  Procedure Laterality Date  . APPENDECTOMY    . HERNIA REPAIR     RIGHT  . KNEE SURGERY     BOTH  . TRANSTHORACIC ECHOCARDIOGRAM  04/03/2009   EF=>55%; trace TR, mild pulm regurg   Family History:  Family History  Problem Relation Age of Onset  . Stroke Father 13  . Pneumonia Father   . Heart failure Mother 80  . Coronary artery disease Mother        also MI  . Heart attack Mother   . Heart attack Maternal Uncle        died in 79s  . Cancer Other        x2; maternal side  . Diabetes Maternal Grandmother    Family Psychiatric  History: See H&P  Social History:  Social History   Substance and Sexual Activity  Alcohol Use No  . Alcohol/week: 0.0 standard drinks     Social History   Substance and Sexual Activity  Drug Use No    Social History   Socioeconomic History  . Marital status: Married    Spouse name: Not on file  . Number of children: 2  . Years of education: Not on file  . Highest education level: Not on file  Occupational History    Employer:  UNEMPLOYED  Social Needs  . Financial resource strain: Not on file  . Food insecurity:    Worry: Not on file    Inability: Not on file  . Transportation needs:    Medical: Not on file    Non-medical: Not on file  Tobacco Use  . Smoking status: Never Smoker  . Smokeless tobacco: Current User    Types: Snuff, Chew  . Tobacco comment: started chewing tobacco at age 57 years old-stopped chewing and dips snuff only  Substance and Sexual Activity  . Alcohol use: No    Alcohol/week: 0.0 standard drinks  . Drug use: No  . Sexual activity: Not Currently  Lifestyle  . Physical activity:    Days per week: Not on file    Minutes per session: Not on file  . Stress: Not on file  Relationships  . Social  connections:    Talks on phone: Not on file    Gets together: Not on file    Attends religious service: Not on file    Active member of club or organization: Not on file    Attends meetings of clubs or organizations: Not on file    Relationship status: Not on file  Other Topics Concern  . Not on file  Social History Narrative  . Not on file   Hospital Course: (Per Md's admission evaluation): Patient presents voluntarily, suicidal, was obsessing about losing his water does not seem to be to reality to this. According to the assessment team: Travis [name] an 58 y.o.male.The pt came in after consuming weed killer. The pt stated he was upset, because he is fearful that his wife will leave him. The pt believes his marriage isn't legal. The pt's wife stated they have been married for 7 years and they are legally married. She isn't sure why the pt thinks the marriage isn't legal. The pt's wife denies any problems in the marriage. He isn't currently seeing a counselor or psychiatrist. He was at [facility] in 2018 for SI with a plan to drink bleach. The pt lives with his wife. He works and denies having any problems at work. He denies self harm, HI, legal issues, history of abuse and hallucinations. He stated he isn't sleeping or eating well. The pt denies SA. His UDS is positive for amphetamines.  After the above admission re-evaluation of his presenting symptoms, it was agreed by the treatment team that [name] will need mood stabilization treatments. The medication regimen targeting those symptoms were discussed & initiated with patient's consent. He was medicated, stabilized & discharged on the medications as listed below on the discharge medication lists. He was enrolled & participated in the group counseling sessions being offered & held on this unit. He learned coping skills. Alben presented other significant pre-existing health issues that required treatments. He was resumed &  discharged on all his pertinent home medications for those health issues. He tolerated his treatment regimen without any adverse effects or reactions reported.  [name] is seen today for discharge. He has normal anxiety about going home after being in the hospital for a number of days. He is not overwhelmed by this. He is looking forward to going home & taking his mental health medications. Not expressing any delusions today. No hallucination. Feels in control of himself. No passivity of thought or will. No fantasy about suicide or self-harm behaviors. He says he is no longer afraid or worried that his wife will leave him. No suicidal thoughts. No thoughts  of violence. No craving for substances. Does not feel depressed. No evidence of mania.  The nursing staff reports that patient has been appropriate on the unit. Patient has been interacting well with peers. No behavioral issues. Patient has not voiced any suicidal thoughts. Patient has not been observed to be internally stimulated or preoccupied. Patient has been adherent to his treatment recommendations. Patient has been tolerating his medications well. No reported adverse effects or reactions.   Patient was discussed at the treatment team meeting this morning. The team members feel that patient is back to his baseline level of function. Team agrees with plan to discharge patient today to continue mental health care on outpatient basis as noted below. Jovanni was provided with all the necessary information needed to make this appointment without problems. He was able to engage in safety planning including plan to return to [facility] or contact emergency services if he feels unable to maintain hisown safety or the safety of others. Pt had no further questions, comments or concerns. He left [facility] with all personal belongings in no apparent distress. Transportation per wife.   Physical Findings: AIMS: Facial and Oral Movements Muscles of Facial Expression:  None, normal Lips and Perioral Area: None, normal Jaw: None, normal Tongue: None, normal,Extremity Movements Upper (arms, wrists, hands, fingers): None, normal Lower (legs, knees, ankles, toes): None, normal, Trunk Movements Neck, shoulders, hips: None, normal, Overall Severity Severity of abnormal movements (highest score from questions above): None, normal Incapacitation due to abnormal movements: None, normal Patient's awareness of abnormal movements (rate only patient's report): No Awareness, Dental Status Current problems with teeth and/or dentures?: No Does patient usually wear dentures?: No  CIWA:    COWS:     Musculoskeletal: Strength & Muscle Tone: within normal limits Gait & Station: normal Patient leans: Right  Psychiatric Specialty Exam: Physical Exam  Nursing note and vitals reviewed. Constitutional: He appears well-developed.  HENT:  Head: Normocephalic.  Eyes: Pupils are equal, round, and reactive to light.  Neck: Normal range of motion.  Cardiovascular: Normal rate.  Respiratory: Effort normal.  GI: Soft.  Genitourinary:    Genitourinary Comments: Deferred   Musculoskeletal: Normal range of motion.  Neurological: He is alert.    Review of Systems  Constitutional: Negative.   HENT: Negative.   Eyes: Negative.   Respiratory: Negative.  Negative for cough and shortness of breath.   Cardiovascular: Negative.  Negative for chest pain and palpitations.  Gastrointestinal: Negative.  Negative for abdominal pain, heartburn, nausea and vomiting.  Genitourinary: Negative.   Musculoskeletal: Negative.   Skin: Negative.   Neurological: Negative.  Negative for dizziness and headaches.  Endo/Heme/Allergies: Negative.   Psychiatric/Behavioral: Positive for depression (Stabilized with medication prior to discharge), hallucinations ( Hx. psychosis (Stabilized with medication prior to discharge) and substance abuse (Hx, Amphetamine/Benzodiazepine use disorders ). Negative  for memory loss and suicidal ideas. The patient has insomnia (Stabilized with medication prior to discharge). The patient is not nervous/anxious (Stable).   All other systems reviewed and are negative.   Blood pressure (!) 148/95, pulse 94, temperature 98.1 F (36.7 C), temperature source Oral, resp. rate 18, height 5\' 5"  (1.651 m), weight 78.9 kg, SpO2 96 %.Body mass index is 28.96 kg/m.  See Md's discharge SRA.   Have you used any form of tobacco in the last 30 days? (Cigarettes, Smokeless Tobacco, Cigars, and/or Pipes): Yes  Has this patient used any form of tobacco in the last 30 days? (Cigarettes, Smokeless Tobacco, Cigars, and/or Pipes): Yes, an  FDA-approved tobacco cessation medication was offered at discharge.  Blood Alcohol level:  Lab Results  Component Value Date   ETH <10 10/12/2018   ETH <5 04/08/4817    Metabolic Disorder Labs:  Lab Results  Component Value Date   HGBA1C 6.3 (H) 04/16/2017   MPG 134.11 04/16/2017   No results found for: PROLACTIN Lab Results  Component Value Date   CHOL 152 04/16/2017   TRIG 209 (H) 04/16/2017   HDL 23 (L) 04/16/2017   CHOLHDL 6.6 04/16/2017   VLDL 42 (H) 04/16/2017   LDLCALC 87 04/16/2017   See Psychiatric Specialty Exam and Suicide Risk Assessment completed by Attending Physician prior to discharge.  Discharge destination:  Home  Is patient on multiple antipsychotic therapies at discharge:  No    Has Patient had three or more failed trials of antipsychotic monotherapy by history:  No  Recommended Plan for Multiple Antipsychotic Therapies: NA  Allergies as of 10/15/2018      Reactions   Doxycycline Other (See Comments)   Reaction:  Blisters    Lisinopril Swelling      Medication List    STOP taking these medications   acetaminophen 325 MG tablet Commonly known as:  TYLENOL   chlorhexidine 0.12 % solution Commonly known as:  PERIDEX   HYDROcodone-acetaminophen 5-325 MG tablet Commonly known as:   NORCO/VICODIN   indomethacin 25 MG capsule Commonly known as:  INDOCIN   lactulose 10 GM/15ML solution Commonly known as:  CHRONULAC   lidocaine 5 % Commonly known as:  LIDODERM   omeprazole 20 MG capsule Commonly known as:  PRILOSEC   ondansetron 4 MG tablet Commonly known as:  ZOFRAN   polyethylene glycol packet Commonly known as:  MIRALAX / GLYCOLAX   risperiDONE 0.25 MG tablet Commonly known as:  RISPERDAL   senna-docusate 8.6-50 MG tablet Commonly known as:  Senokot-S     TAKE these medications     Indication  amLODipine 10 MG tablet Commonly known as:  NORVASC Take 1 tablet (10 mg total) by mouth daily. For high blood pressure  Indication:  High Blood Pressure Disorder   aspirin 81 MG EC tablet Take 1 tablet (81 mg total) by mouth daily. (May buy from over the counter): For heart health Start taking on:  October 16, 2018 What changed:  additional instructions  Indication:  Heart health   atorvastatin 20 MG tablet Commonly known as:  LIPITOR Take 1 tablet (20 mg total) by mouth daily. For high cholesterol What changed:  additional instructions  Indication:  Inherited Heterozygous Hypercholesterolemia, High Amount of Fats in the Blood   busPIRone 15 MG tablet Commonly known as:  BUSPAR Take 1 tablet (15 mg total) by mouth 3 (three) times daily. For anxiety  Indication:  Anxiety Disorder   cephALEXin 500 MG capsule Commonly known as:  KEFLEX Take 1 capsule (500 mg total) by mouth 2 (two) times daily. For infection What changed:  additional instructions  Indication:  Infection   docusate sodium 100 MG capsule Commonly known as:  COLACE Take 1 capsule (100 mg total) by mouth daily. (May buy from over the counter): For constipation What changed:  additional instructions  Indication:  Constipation   FLUoxetine 40 MG capsule Commonly known as:  PROZAC Take 1 capsule (40 mg total) by mouth daily. For depression Start taking on:  October 16, 2018 What  changed:    medication strength  how much to take  Indication:  Major Depressive Disorder   glipiZIDE 5  MG 24 hr tablet Commonly known as:  GLUCOTROL XL Take 1 tablet (5 mg total) by mouth daily. For diabetes management Start taking on:  October 16, 2018 What changed:    medication strength  how much to take  Indication:  Type 2 Diabetes   hydrOXYzine 25 MG tablet Commonly known as:  ATARAX/VISTARIL Take 1 tablet (25 mg total) by mouth 3 (three) times daily as needed for anxiety.  Indication:  Feeling Anxious   loratadine 10 MG tablet Commonly known as:  CLARITIN Take 1 tablet (10 mg total) by mouth daily. (May buy from over the counter): For allergies What changed:  additional instructions  Indication:  Perennial Allergic Rhinitis, Hayfever   metFORMIN 1000 MG tablet Commonly known as:  GLUCOPHAGE Take 1 tablet (1,000 mg total) by mouth daily. For diabetes management  Indication:  Type 2 Diabetes   nicotine 21 mg/24hr patch Commonly known as:  NICODERM CQ - dosed in mg/24 hours Place 1 patch (21 mg total) onto the skin daily. (May buy from over the counter): For smoking cessation Start taking on:  October 16, 2018  Indication:  Nicotine Addiction   nitroGLYCERIN 0.4 MG SL tablet Commonly known as:  NITROSTAT Place 1 tablet (0.4 mg total) under the tongue every 5 (five) minutes as needed for chest pain.  Indication:  Acute Angina Pectoris   pantoprazole 40 MG tablet Commonly known as:  PROTONIX Take 1 tablet (40 mg total) by mouth daily. For acid reflux  Indication:  Gastroesophageal Reflux Disease   tamsulosin 0.4 MG Caps capsule Commonly known as:  FLOMAX Take 1 capsule (0.4 mg total) by mouth daily after breakfast. For prostate health  Indication:  Benign Enlargement of Prostate   traZODone 100 MG tablet Commonly known as:  DESYREL Take 1 tablet (100 mg total) by mouth at bedtime. For sleep  Indication:  Force, Mood Treatment. Go on 10/19/2018.   Why:  at 10:30AM with Valene Bors, LCSW (therapy). Arrive at 10:00AM for new patient paper work. Please bring all [facility] Discharge information. Go to appointment Med Mgmt 10/20/2018 2:00pm, Peter Minium, MD. Pls bring all [facility] Discharge inform Peter Minium, MD Contact information: [address] [place] [address] [phone]          Follow-up recommendations: Activity:  As tolerated Diet: As recommended by your primary care doctor. Keep all scheduled follow-up appointments as recommended.  Comments: Patient is instructed prior to discharge to: Take all medications as prescribed by his/her mental healthcare provider. Report any adverse effects and or reactions from the medicines to his/her outpatient provider promptly. Patient has been instructed & cautioned: To not engage in alcohol and or illegal drug use while on prescription medicines. In the event of worsening symptoms, patient is instructed to call the crisis hotline, 911 and or go to the nearest ED for appropriate evaluation and treatment of symptoms. To follow-up with his/her primary care provider for your other medical issues, concerns and or health care needs.   Signed: Lindell Spar, NP, PMHNP, FNP-BC. 10/15/2018, 6:43 PM

## 2018-10-15 NOTE — BHH Suicide Risk Assessment (Signed)
[facility] Discharge Suicide Risk Assessment   Principal Problem: Recent obsessiveness, fears of losing wife that are unrealistic, leading to depression and thoughts of self-harm Discharge Diagnoses: Active Problems:   MDD (major depressive disorder)   Total Time spent with patient: 45 minutes  Currently is alert and oriented and cooperative affect constricted no thoughts of harming self or others contracting fully more reality based in his thinking Mental Status Per Nursing Assessment::   On Admission:  Suicidal ideation indicated by patient, Plan includes specific time, place, or method, Intention to act on suicide plan, Suicidal ideation indicated by others, Self-harm thoughts, Belief that plan would result in death, Suicide plan, Self-harm behaviors  Demographic Factors:  Male  Loss Factors: NA  Historical Factors: NA  Risk Reduction Factors:   Sense of responsibility to family and Religious beliefs about death  Continued Clinical Symptoms:  Personality Disorders:   Cluster C  Cognitive Features That Contribute To Risk:  Loss of executive function    Suicide Risk:  Minimal: No identifiable suicidal ideation.  Patients presenting with no risk factors but with morbid ruminations; may be classified as minimal risk based on the severity of the depressive symptoms    Plan Of Care/Follow-up recommendations:  Activity:  full  Travis Mcilvaine, MD 10/15/2018, 8:49 AM

## 2018-10-15 NOTE — Progress Notes (Signed)
Recreation Therapy Notes  Date:  2. 20. 20 Time: 1000 Location:  [place]  Group Topic: Communication, Team Building, Problem Solving  Goal Area(s) Addresses:  Patient will effectively work with peer towards shared goal.  Patient will identify skills used to make activity successful.  Patient will identify how skills used during activity can be used to reach post d/c goals.   Behavioral Response: Engaged  Intervention: STEM Activity  Activity: Geophysicist/field seismologist. In teams patients were given 12 plastic drinking straws and a length of masking tape. Using the materials provided patients were asked to build a landing pad to catch a golf ball dropped from approximately 6 feet in the air.   Education: Education officer, community, Discharge Planning   Education Outcome: Acknowledges education/In group clarification offered/Needs additional education.   Clinical Observations/Feedback:  Pt was quiet but active.  Pt followed along with his peers to complete the activity.  Pt was pleasant and stated you could communicate with your support system so they would know what is going on with you.    Victorino Sparrow, LRT/CTRS     Victorino Sparrow A 10/15/2018 10:51 AM

## 2018-10-15 NOTE — Progress Notes (Signed)
Recreation Therapy Notes  INPATIENT RECREATION TR PLAN  Patient Details Name: Travis [name] MRN: [id number] DOB: [birth year] Today's Date: 10/15/2018  Rec Therapy Plan Is patient appropriate for Therapeutic Recreation?: Yes Treatment times per week: about 3 days Estimated Length of Stay: 5-7 days TR Treatment/Interventions: Group participation (Comment)  Discharge Criteria Pt will be discharged from therapy if:: Discharged Treatment plan/goals/alternatives discussed and agreed upon by:: Patient/family  Discharge Summary Short term goals set: See patient care plan Short term goals met: Adequate for discharge Progress toward goals comments: Groups attended Which groups?: Other (Comment)(Team building) Reason goals not met: Pt attended one group and is discharging. Therapeutic equipment acquired: N/A Reason patient discharged from therapy: Discharge from hospital Pt/family agrees with progress & goals achieved: Yes Date patient discharged from therapy: 10/15/18     Victorino Sparrow, LRT/CTRS  Ria Comment, Janeann Paisley A 10/15/2018, 10:35 AM

## 2018-10-15 NOTE — BHH Group Notes (Signed)
[facility] LCSW Group Therapy Note  Date/Time: 10/15/18, 1315  Type of Therapy/Topic:  Group Therapy:  Balance in Life  Participation Level:  moderate  Description of Group:    This group will address the concept of balance and how it feels and looks when one is unbalanced. Patients will be encouraged to process areas in their lives that are out of balance, and identify reasons for remaining unbalanced. Facilitators will guide patients utilizing problem- solving interventions to address and correct the stressor making their life unbalanced. Understanding and applying boundaries will be explored and addressed for obtaining  and maintaining a balanced life. Patients will be encouraged to explore ways to assertively make their unbalanced needs known to significant others in their lives, using other group members and facilitator for support and feedback.  Therapeutic Goals: 1. Patient will identify two or more emotions or situations they have that consume much of in their lives. 2. Patient will identify signs/triggers that life has become out of balance:  3. Patient will identify two ways to set boundaries in order to achieve balance in their lives:  4. Patient will demonstrate ability to communicate their needs through discussion and/or role plays  Summary of Patient Progress:Pt attentive during group, said that finances are currently out of balance and spoke about having trouble paying his bills due to his low paying job.            Therapeutic Modalities:   Cognitive Behavioral Therapy Solution-Focused Therapy Assertiveness Training  Lurline Idol, [place]

## 2018-10-15 NOTE — BHH Suicide Risk Assessment (Signed)
[facility] INPATIENT:  Family/Significant Other Suicide Prevention Education  Suicide Prevention Education:  Education Completed; wife Melinda Martinique [phone], has been identified by the patient as the family member/significant other with whom the patient will be residing, and identified as the person(s) who will aid the patient in the event of a mental health crisis (suicidal ideations/suicide attempt).  With written consent from the patient, the family member/significant other has been provided the following suicide prevention education, prior to the and/or following the discharge of the patient.  The suicide prevention education provided includes the following:  Suicide risk factors  Suicide prevention and interventions  National Suicide Hotline telephone number  [facility] assessment telephone number  [facility] Emergency Assistance [place] and/or Residential Mobile Crisis Unit telephone number  Request made of family/significant other to:  Remove weapons (e.g., guns, rifles, knives), all items previously/currently identified as safety concern.  [name] verfied that she will remove a black powder gun to make sure it is not accessible to Legrand Como.  Remove drugs/medications (over-the-counter, prescriptions, illicit drugs), all items previously/currently identified as a safety concern.  The family member/significant other verbalizes understanding of the suicide prevention education information provided.  The family member/significant other agrees to remove the items of safety concern listed above.  [name], MSW Intern 10/15/2018, 11:01 AM

## 2018-10-15 NOTE — BHH Group Notes (Signed)
Pt did not attend orientation group.  

## 2018-10-15 NOTE — Progress Notes (Signed)
Pt discharged to lobby. Pt was stable and appreciative at that time. All papers, samples and prescriptions were given and valuables returned. Verbal understanding expressed. Denies SI/HI and A/VH. Pt given opportunity to express concerns and ask questions.  

## 2018-10-15 NOTE — Plan of Care (Signed)
Pt was able to identify one coping skill during recreation therapy group session.   Victorino Sparrow, LRT/CTRS

## 2018-10-15 NOTE — H&P (Signed)
Psychiatric Admission Assessment Adult  Patient Identification: Travis [name] MRN:  [id number] Date of Evaluation:  10/15/2018 Chief Complaint:  Schizoaffective disorder depressive type Principal Diagnosis: <principal problem not specified> Diagnosis:  Active Problems:   MDD (major depressive disorder)  History of Present Illness:   Patient presents voluntarily, suicidal, was obsessing about losing his water does not seem to be to reality to this.  According to the assessment team:   Travis [name] is an 58 y.o. male.  The pt came in after consuming weed killer.  The pt stated he was upset, because he is fearful that his wife will leave him.  The pt believes his marriage isn't legal.  The pt's wife stated they have been married for 7 years and they are legally married.  She isn't sure why the pt thinks the marriage isn't legal.  The pt's wife denies any problems in the marriage.  He isn't currently seeing a counselor or psychiatrist.  He was at [facility] in 2018 for SI with a plan to drink bleach.    The pt lives with his wife.  He works and denies having any problems at work.  He denies self harm, HI, legal issues, history of abuse and hallucinations.  He stated he isn't sleeping or eating well.  The pt denies SA.  His UDS is positive for amphetamines.  Pt is dressed in scrubs. He is alert and oriented x4. Pt speaks in a clear tone, at moderate volume and normal pace. Eye contact is good. Pt's mood is irritated. Thought process is coherent and relevant. There is no indication Pt is currently responding to internal stimuli or experiencing delusional thought content.?Pt was guarded throughout assessment and initially refused to talk.  Associated Signs/Symptoms: Depression Symptoms:  anhedonia, (Hypo) Manic Symptoms:  Impulsivity, Anxiety Symptoms:  n/a Psychotic Symptoms:  n/a PTSD Symptoms: NA Total Time spent with patient: 45 minutes  Is the patient at risk to self? Yes.    Has  the patient been a risk to self in the past 6 months? No.  Has the patient been a risk to self within the distant past? No.  Is the patient a risk to others? No.  Has the patient been a risk to others in the past 6 months? No.  Has the patient been a risk to others within the distant past? No.   Alcohol Screening: 1. How often do you have a drink containing alcohol?: Never 2. How many drinks containing alcohol do you have on a typical day when you are drinking?: 1 or 2 3. How often do you have six or more drinks on one occasion?: Never AUDIT-C Score: 0 4. How often during the last year have you found that you were not able to stop drinking once you had started?: Never 5. How often during the last year have you failed to do what was normally expected from you becasue of drinking?: Never 6. How often during the last year have you needed a first drink in the morning to get yourself going after a heavy drinking session?: Never 7. How often during the last year have you had a feeling of guilt of remorse after drinking?: Never 8. How often during the last year have you been unable to remember what happened the night before because you had been drinking?: Never 9. Have you or someone else been injured as a result of your drinking?: No 10. Has a relative or friend or a doctor or another Economist  been concerned about your drinking or suggested you cut down?: No Alcohol Use Disorder Identification Test Final Score (AUDIT): 0 Substance Abuse History in the last 12 months:  Yes.   Consequences of Substance Abuse: NA Previous Psychotropic Medications: Yes  Psychological Evaluations: No  Past Medical History:  Past Medical History:  Diagnosis Date  . Dyslipidemia   . Fatigue    NAUSEA,BLOATING  . H. pylori infection 09/15/2006  . Hematochezia 08/30/2007  . History of nuclear stress test 04/03/2009   exercise myoview; normal pattern of perfusion; low risk scan   . Hypertension    treated  .  NIDDM (non-insulin dependent diabetes mellitus) 2011   type 2  . Schizoaffective disorder ([place])   . Tobacco chew use   . Unspecified vitamin D deficiency     Past Surgical History:  Procedure Laterality Date  . APPENDECTOMY    . HERNIA REPAIR     RIGHT  . KNEE SURGERY     BOTH  . TRANSTHORACIC ECHOCARDIOGRAM  04/03/2009   EF=>55%; trace TR, mild pulm regurg   Family History:  Family History  Problem Relation Age of Onset  . Stroke Father 7  . Pneumonia Father   . Heart failure Mother 75  . Coronary artery disease Mother        also MI  . Heart attack Mother   . Heart attack Maternal Uncle        died in 36s  . Cancer Other        x2; maternal side  . Diabetes Maternal Grandmother     Tobacco Screening: Have you used any form of tobacco in the last 30 days? (Cigarettes, Smokeless Tobacco, Cigars, and/or Pipes): Yes Tobacco use, Select all that apply: 5 or more cigarettes per day Are you interested in Tobacco Cessation Medications?: Yes, will notify MD for an order Counseled patient on smoking cessation including recognizing danger situations, developing coping skills and basic information about quitting provided: Refused/Declined practical counseling Social History:  Social History   Substance and Sexual Activity  Alcohol Use No  . Alcohol/week: 0.0 standard drinks     Social History   Substance and Sexual Activity  Drug Use No    Additional Social History: Marital status: Married Number of Years Married: 6 What types of issues is patient dealing with in the relationship?: None reported Additional relationship information: He reported [name] has "a big heart" Are you sexually active?: Yes What is your sexual orientation?: straight Has your sexual activity been affected by drugs, alcohol, medication, or emotional stress?: Denied history Does patient have children?: Yes How many children?: 2 How is patient's relationship with their children?: He stated does not  get to see him because of interference from his 1st wife                         Allergies:   Allergies  Allergen Reactions  . Doxycycline Other (See Comments)    Reaction:  Blisters   . Lisinopril Swelling   Lab Results:  Results for orders placed or performed during the hospital encounter of 10/13/18 (from the past 48 hour(s))  Glucose, capillary     Status: Abnormal   Collection Time: 10/14/18  6:11 AM  Result Value Ref Range   Glucose-Capillary 116 (H) 70 - 99 mg/dL  Glucose, capillary     Status: Abnormal   Collection Time: 10/15/18  5:52 AM  Result Value Ref Range   Glucose-Capillary 155 (H)  70 - 99 mg/dL   Comment 1 Notify RN     Blood Alcohol level:  Lab Results  Component Value Date   ETH <10 10/12/2018   ETH <5 37/90/2409    Metabolic Disorder Labs:  Lab Results  Component Value Date   HGBA1C 6.3 (H) 04/16/2017   MPG 134.11 04/16/2017   No results found for: PROLACTIN Lab Results  Component Value Date   CHOL 152 04/16/2017   TRIG 209 (H) 04/16/2017   HDL 23 (L) 04/16/2017   CHOLHDL 6.6 04/16/2017   VLDL 42 (H) 04/16/2017   LDLCALC 87 04/16/2017    Current Medications: Current Facility-Administered Medications  Medication Dose Route Frequency Provider Last Rate Last Dose  . amLODipine (NORVASC) tablet 10 mg  10 mg Oral Daily Lindon Romp A, NP   10 mg at 10/15/18 0804  . aspirin EC tablet 81 mg  81 mg Oral Daily Lindon Romp A, NP   81 mg at 10/15/18 7353  . atorvastatin (LIPITOR) tablet 20 mg  20 mg Oral Daily Lindon Romp A, NP   20 mg at 10/15/18 2992  . busPIRone (BUSPAR) tablet 15 mg  15 mg Oral TID Johnn Hai, MD   15 mg at 10/15/18 0804  . cephALEXin (KEFLEX) capsule 500 mg  500 mg Oral BID Lindon Romp A, NP   500 mg at 10/15/18 0803  . docusate sodium (COLACE) capsule 100 mg  100 mg Oral Daily Lindon Romp A, NP   100 mg at 10/15/18 0804  . FLUoxetine (PROZAC) capsule 40 mg  40 mg Oral Daily Johnn Hai, MD   40 mg at 10/15/18  0803  . glipiZIDE (GLUCOTROL XL) 24 hr tablet 5 mg  5 mg Oral Daily Lindon Romp A, NP   5 mg at 10/15/18 0804  . hydrOXYzine (ATARAX/VISTARIL) tablet 25 mg  25 mg Oral TID PRN Rozetta Nunnery, NP   25 mg at 10/14/18 2151  . lactulose (CHRONULAC) 10 GM/15ML solution 10 g  10 g Oral Daily PRN Lindon Romp A, NP      . loratadine (CLARITIN) tablet 10 mg  10 mg Oral Daily Lindon Romp A, NP   10 mg at 10/15/18 0803  . metFORMIN (GLUCOPHAGE) tablet 1,000 mg  1,000 mg Oral Daily Lindon Romp A, NP   1,000 mg at 10/15/18 0804  . nicotine (NICODERM CQ - dosed in mg/24 hours) patch 21 mg  21 mg Transdermal Daily Johnn Hai, MD   21 mg at 10/15/18 0804  . pantoprazole (PROTONIX) EC tablet 40 mg  40 mg Oral Daily Lindon Romp A, NP   40 mg at 10/15/18 0804  . tamsulosin (FLOMAX) capsule 0.4 mg  0.4 mg Oral QPC breakfast Lindon Romp A, NP   0.4 mg at 10/15/18 0804  . traZODone (DESYREL) tablet 100 mg  100 mg Oral QHS Lindon Romp A, NP   100 mg at 10/14/18 2151   PTA Medications: Facility-Administered Medications Prior to Admission  Medication Dose Route Frequency Provider Last Rate Last Dose  . 0.9 %  sodium chloride infusion  500 mL Intravenous Once Nandigam, Venia Minks, MD       Medications Prior to Admission  Medication Sig Dispense Refill Last Dose  . acetaminophen (TYLENOL) 325 MG tablet Take 2 tablets (650 mg total) by mouth every 6 (six) hours as needed for mild pain. (Patient not taking: Reported on 10/12/2018)   Not Taking at Unknown time  . aspirin EC 81 MG tablet Take 1  tablet (81 mg total) by mouth daily. For heart health   Past Month at Unknown time  . chlorhexidine (PERIDEX) 0.12 % solution Use as directed 15 mLs in the mouth or throat 2 (two) times daily. For dental problems. (Patient not taking: Reported on 12/18/2017) 120 mL 0 Not Taking at Unknown time  . FLUoxetine (PROZAC) 20 MG capsule Take 1 capsule (20 mg total) by mouth daily. For depression 30 capsule 0 10/11/2018 at Unknown time   . glipiZIDE (GLUCOTROL XL) 10 MG 24 hr tablet Take 1 tablet (10 mg total) by mouth daily. For diabetes management (Patient taking differently: Take 5 mg by mouth daily. For diabetes management)   10/11/2018 at Unknown time  . HYDROcodone-acetaminophen (NORCO/VICODIN) 5-325 MG tablet Take 0.5 tablets by mouth every evening.   0 10/11/2018 at Unknown time  . indomethacin (INDOCIN) 25 MG capsule Take 1 capsule by mouth 2 (two) times daily.   Not Taking at Unknown time  . lactulose (CHRONULAC) 10 GM/15ML solution Take 10 g by mouth daily as needed.    unknown  . lidocaine (LIDODERM) 5 % Place 1 patch onto the skin daily. Remove & Discard patch within 12 hours or as directed by MD: For pain management (Patient not taking: Reported on 12/18/2017) 7 patch 0 Not Taking at Unknown time  . omeprazole (PRILOSEC) 20 MG capsule Take 20 mg by mouth daily.   10/11/2018 at Unknown time  . ondansetron (ZOFRAN) 4 MG tablet Take 4 mg by mouth once.   0 10/11/2018 at Unknown time  . polyethylene glycol (MIRALAX / GLYCOLAX) packet Take 17 g by mouth 2 (two) times daily. For constipation (Patient taking differently: Take 17 g by mouth daily as needed for mild constipation. For constipation) 14 each 0 unknown  . risperiDONE (RISPERDAL) 0.25 MG tablet Take 0.25 mg by mouth daily.  11 Not Taking at Unknown time  . senna-docusate (SENOKOT-S) 8.6-50 MG tablet Take 1 tablet by mouth 2 (two) times daily. For constipation (Patient taking differently: Take 1 tablet by mouth daily. For constipation)   10/11/2018 at Unknown time  . [DISCONTINUED] amLODipine (NORVASC) 10 MG tablet Take 1 tablet (10 mg total) by mouth daily. For high blood pressure 10 tablet 0 10/11/2018 at Unknown time  . [DISCONTINUED] atorvastatin (LIPITOR) 20 MG tablet Take 20 mg by mouth daily.  11 Past Month at Unknown time  . [DISCONTINUED] cephALEXin (KEFLEX) 500 MG capsule Take 1 capsule (500 mg total) by mouth 2 (two) times daily. 10 capsule 0 10/11/2018 at Unknown  time  . [DISCONTINUED] docusate sodium (COLACE) 100 MG capsule Take 100 mg by mouth daily.   10/11/2018 at Unknown time  . [DISCONTINUED] hydrOXYzine (ATARAX/VISTARIL) 25 MG tablet Take 1 tablet (25 mg total) by mouth 3 (three) times daily as needed for anxiety. 60 tablet 0 unknown  . [DISCONTINUED] loratadine (CLARITIN) 10 MG tablet Take 1 tablet (10 mg total) by mouth daily. For allergies   unknown at Unknown time  . [DISCONTINUED] metFORMIN (GLUCOPHAGE) 1000 MG tablet Take 1 tablet (1,000 mg total) by mouth daily. For diabetes management   10/11/2018 at Unknown time  . [DISCONTINUED] nitroGLYCERIN (NITROSTAT) 0.4 MG SL tablet Place 1 tablet (0.4 mg total) under the tongue every 5 (five) minutes as needed for chest pain.  12 unknown  . [DISCONTINUED] pantoprazole (PROTONIX) 40 MG tablet Take 1 tablet (40 mg total) by mouth daily. For acid reflux 1 tablet 0 10/11/2018 at Unknown time  . [DISCONTINUED] tamsulosin (FLOMAX) 0.4 MG  CAPS capsule Take 1 capsule (0.4 mg total) by mouth daily after breakfast. For prostate health 1 capsule 0 10/11/2018 at Unknown time  . [DISCONTINUED] traZODone (DESYREL) 100 MG tablet Take 1 tablet (100 mg total) by mouth at bedtime. For sleep 30 tablet 0 10/10/2018 at Unknown time    Musculoskeletal: Strength & Muscle Tone: within normal limits Gait & Station: normal Patient leans: N/A  Psychiatric Specialty Exam: Physical Exam  ROS  Blood pressure (!) 148/95, pulse 94, temperature 98.1 F (36.7 C), temperature source Oral, resp. rate 18, height 5\' 5"  (1.651 m), weight 78.9 kg, SpO2 96 %.Body mass index is 28.96 kg/m.  General Appearance: Guarded  Eye Contact:  Minimal  Speech:  Slurred  Volume:  Decreased  Mood:  Dysphoric  Affect:  Congruent  Thought Process:  Linear  Orientation:  Full (Time, Place, and Person)  Thought Content:  Logical  Suicidal Thoughts:  Yes.  without intent/plan  Homicidal Thoughts:  No  Memory:  Immediate;   Fair  Judgement:  Fair   Insight:  Fair  Psychomotor Activity:  Normal  Concentration:  Concentration: Fair  Recall:  Poor  Fund of Knowledge:  Fair  Language:  Fair  Akathisia:  Negative  Handed:  Right  AIMS (if indicated):     Assets:  Social Support  ADL's:  Intact  Cognition:  WNL  Sleep:  Number of Hours: 5.75    Treatment Plan Summary: Daily contact with patient to assess and evaluate symptoms and progress in treatment and Medication management  Observation Level/Precautions:  15 minute checks  Laboratory:  UDS  Psychotherapy:    Medications:    Consultations:    Discharge Concerns:    Estimated LOS:  Other:     Physician Treatment Plan for Primary Diagnosis: <principal problem not specified> Long Term Goal(s): Improvement in symptoms so as ready for discharge  Short Term Goals: Ability to identify and develop effective coping behaviors will improve and Ability to maintain clinical measurements within normal limits will improve  Physician Treatment Plan for Secondary Diagnosis: Active Problems:   MDD (major depressive disorder)  Long Term Goal(s): Improvement in symptoms so as ready for discharge  Short Term Goals: Ability to identify and develop effective coping behaviors will improve and Ability to identify triggers associated with substance abuse/mental health issues will improve  I certify that inpatient services furnished can reasonably be expected to improve the patient's condition.    Johnn Hai, MD 2/20/202010:30 AM

## 2018-10-15 NOTE — Progress Notes (Signed)
°  [facility] Adult Case Management Discharge Plan :  Will you be returning to the same living situation after discharge:  Yes,  Home At discharge, do you have transportation home?: Yes,  with wife Dorothea Ogle you have the ability to pay for your medications: Yes,  Private insurance  Release of information consent forms completed and in the chart;  Patient's signature needed at discharge.  Patient to Follow up at: Follow-up [facility]. Go on 10/19/2018.   Why:  at 10:30AM with Valene Bors, LCSW (therapy). Arrive at 10:00AM for new patient paper work. Please bring all [facility] Discharge information. Go to appointment Med Mgmt 10/20/2018 2:00pm, Peter Minium, MD. Pls bring all [facility] Discharge inform Peter Minium, MD Contact information: [place] [address] [phone]           Next level of care provider has access to [facility] and Suicide Prevention discussed: Yes,  with wife [name]  Have you used any form of tobacco in the last 30 days? (Cigarettes, Smokeless Tobacco, Cigars, and/or Pipes): Yes  Has patient been referred to the Quitline?: N/A patient is not a smoker  Patient has been referred for addiction treatment: [place], MSW Intern 10/15/2018, 11:06 AM

## 2018-10-24 DIAGNOSIS — G4733 Obstructive sleep apnea (adult) (pediatric): Secondary | ICD-10-CM | POA: Diagnosis not present

## 2018-10-25 DIAGNOSIS — G4733 Obstructive sleep apnea (adult) (pediatric): Secondary | ICD-10-CM | POA: Diagnosis not present

## 2018-11-20 DIAGNOSIS — M25561 Pain in right knee: Secondary | ICD-10-CM | POA: Diagnosis not present

## 2018-11-20 DIAGNOSIS — M25562 Pain in left knee: Secondary | ICD-10-CM | POA: Diagnosis not present

## 2018-11-20 DIAGNOSIS — M170 Bilateral primary osteoarthritis of knee: Secondary | ICD-10-CM | POA: Diagnosis not present

## 2018-11-25 DIAGNOSIS — G4733 Obstructive sleep apnea (adult) (pediatric): Secondary | ICD-10-CM | POA: Diagnosis not present

## 2019-05-13 ENCOUNTER — Other Ambulatory Visit: Payer: Self-pay | Admitting: *Deleted

## 2019-05-13 DIAGNOSIS — Z20822 Contact with and (suspected) exposure to covid-19: Secondary | ICD-10-CM

## 2019-05-15 LAB — NOVEL CORONAVIRUS, NAA: SARS-CoV-2, NAA: NOT DETECTED

## 2019-06-08 IMAGING — CT CT CHEST W/O CM
2 of 4 series · 12 of 36 positions shown, 15 images · non-contrast
Comparison: 01/20/2014

CLINICAL DATA: Chest and back pain.

EXAM:
CT CHEST, ABDOMEN AND PELVIS WITHOUT CONTRAST
TECHNIQUE: Multidetector CT imaging of the chest, abdomen and pelvis was
performed following the standard protocol without IV contrast.

[Series 3: cap wo 5.0 i31f 2 · axial · 0.80mm/px · z∈[+1092,+1572]mm · 9 of 120 slices shown, 12 images]
[im 12/120  mediastinal]
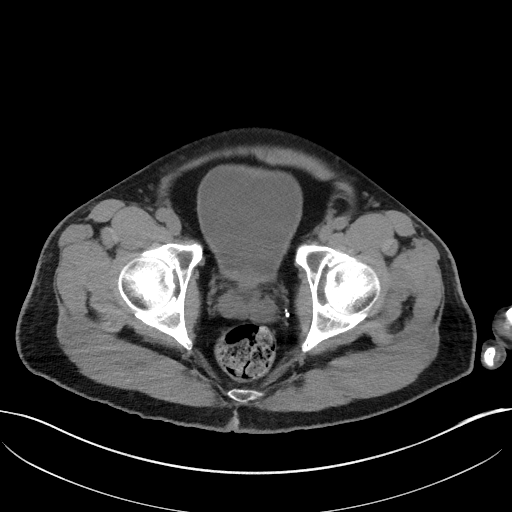
[im 12/120  lung]
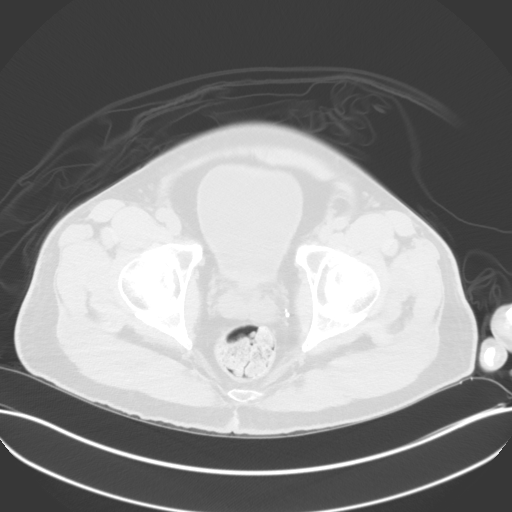
[im 24/120  lung]
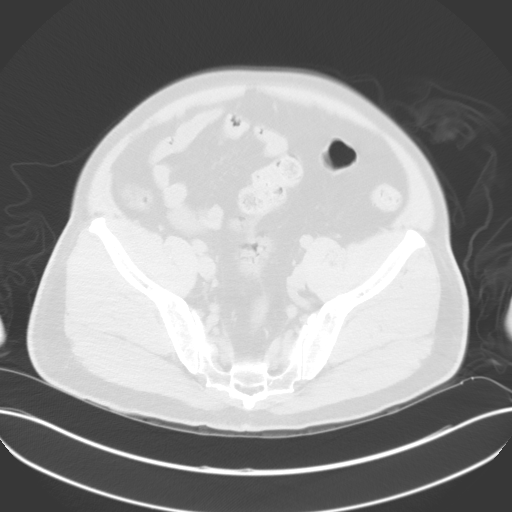
[im 36/120  lung]
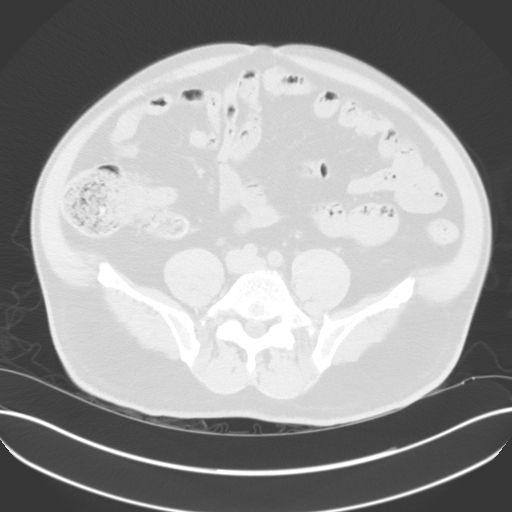
[im 48/120  lung]
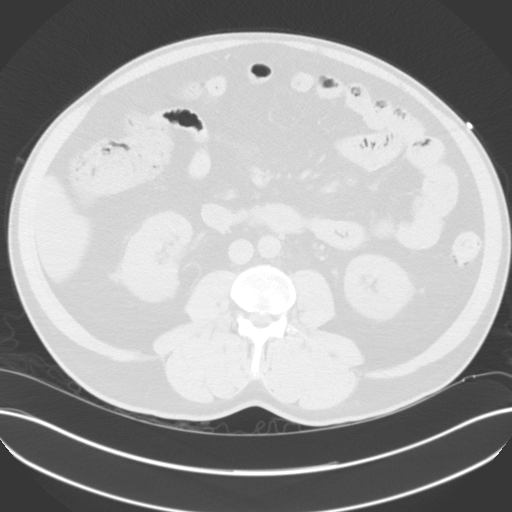
[im 60/120  mediastinal]
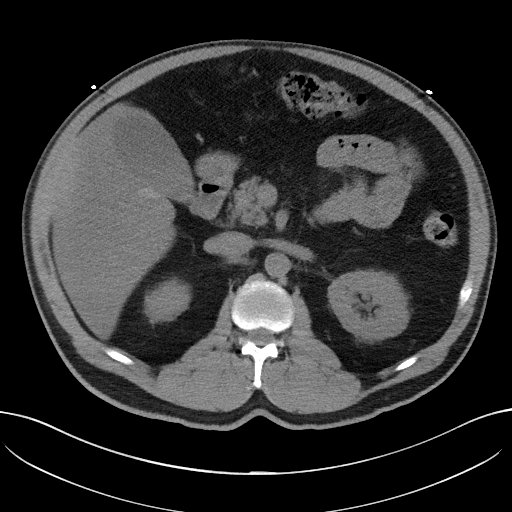
[im 60/120  lung]
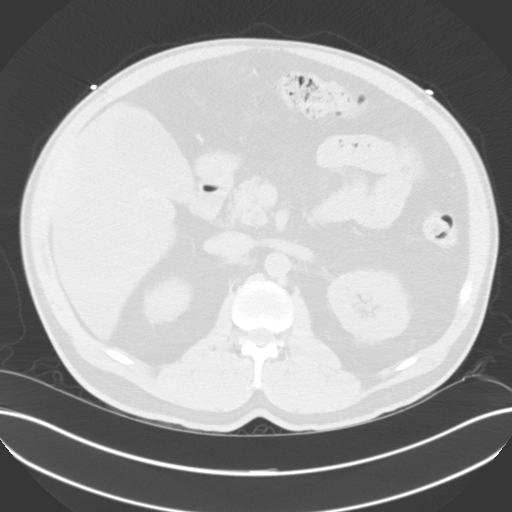
[im 72/120  lung]
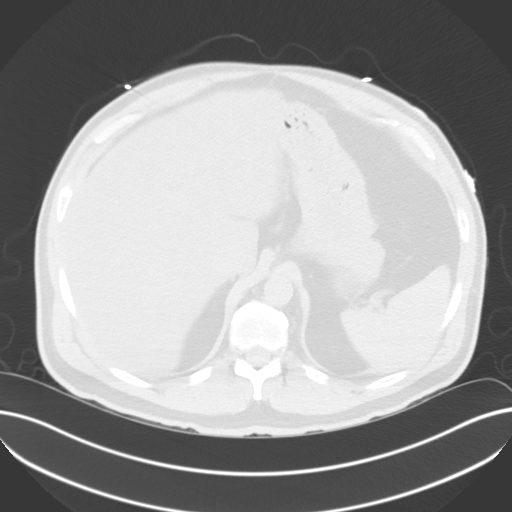
[im 84/120  lung]
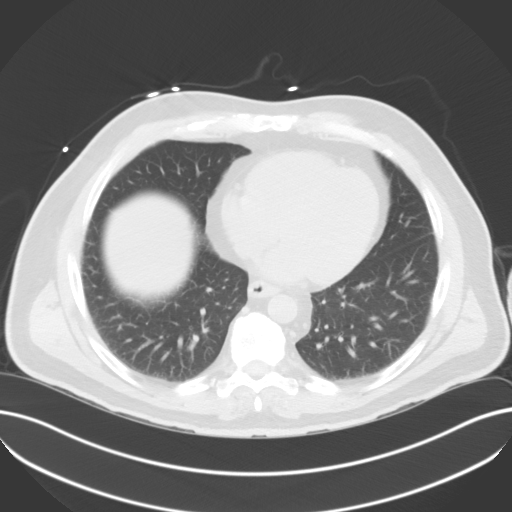
[im 96/120  lung]
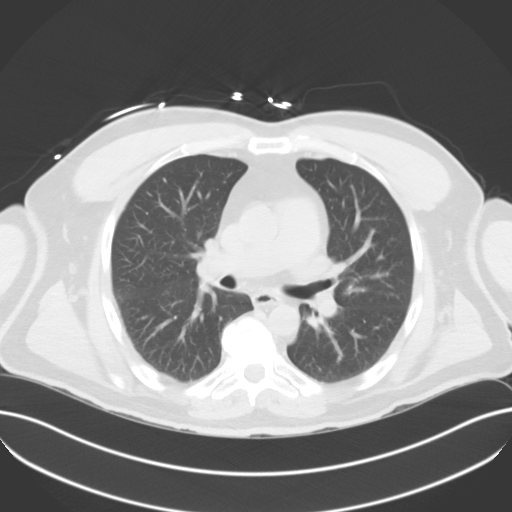
[im 108/120  mediastinal]
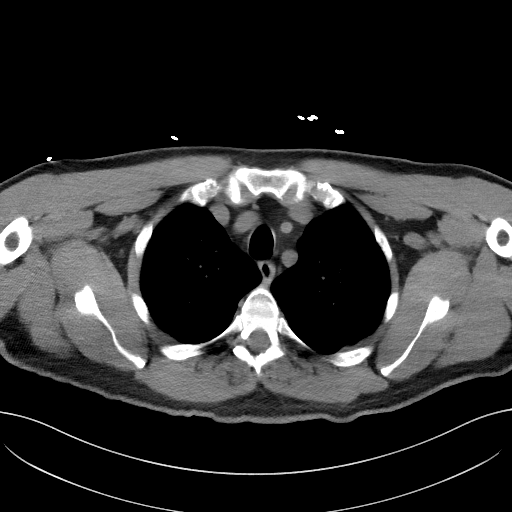
[im 108/120  lung]
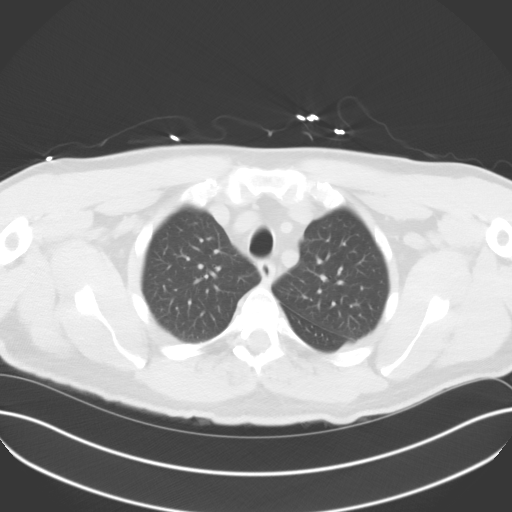

[Series 6: coronal · coronal · 0.73mm/px · 3 of 152 slices shown]
[im 31/152  lung]
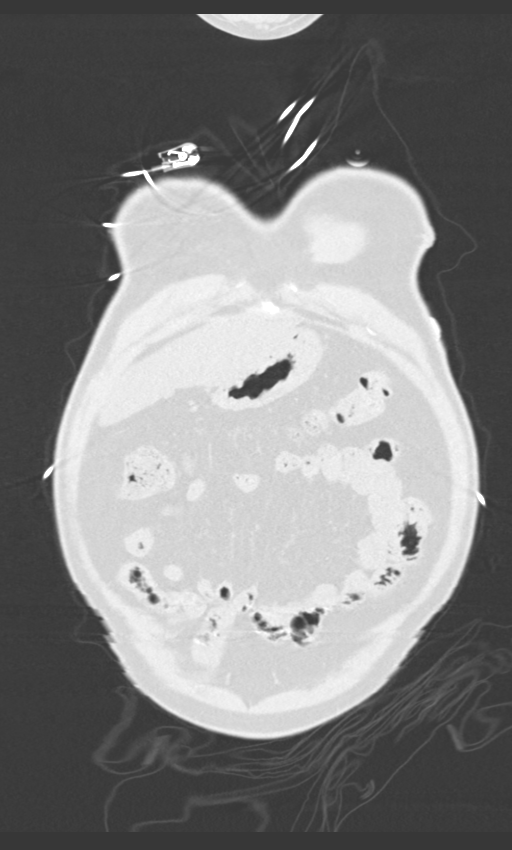
[im 61/152  lung]
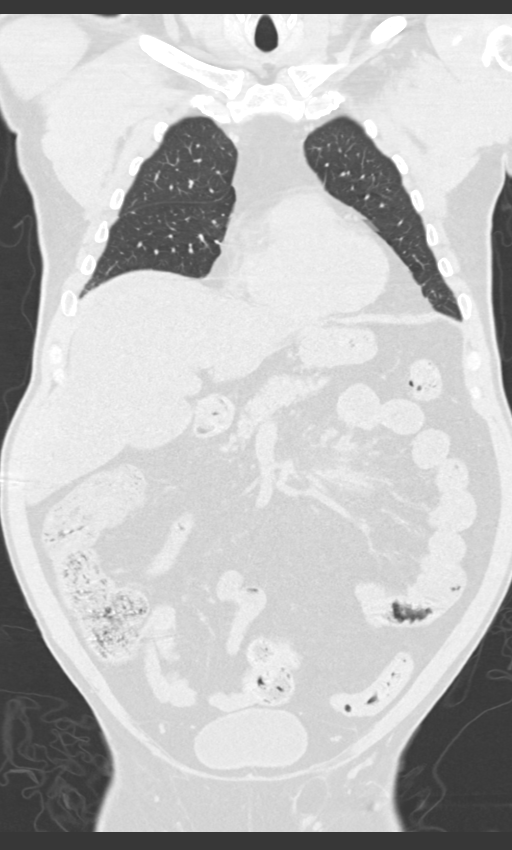
[im 91/152  lung]
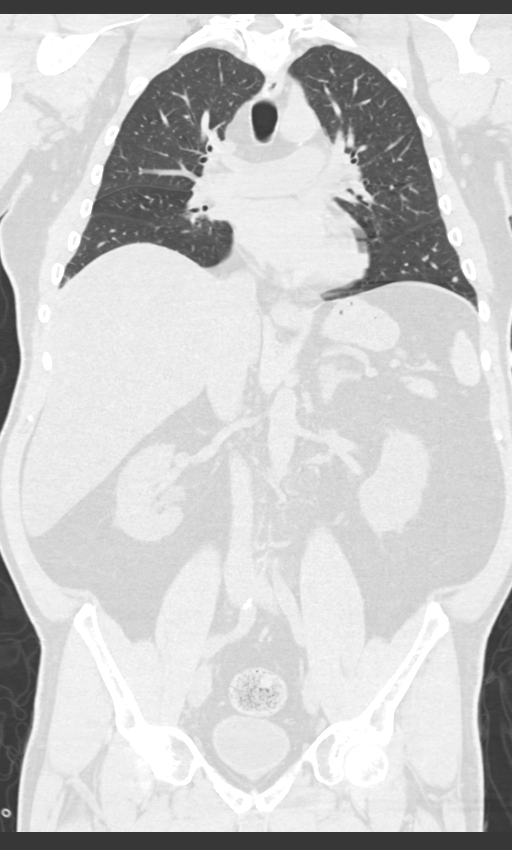

[12 of 36 positions shown; findings below may reference images not displayed]

FINDINGS: CT CHEST FINDINGS

Cardiovascular: Normal size cardiac chambers with minimal coronary
arteriosclerosis along the circumflex. No pericardial effusion.
Minimal aortic atherosclerosis at the arch without aneurysm. Top
normal caliber of the main pulmonary artery. Normal branch pattern
of the great vessels.

Mediastinum/Nodes: No mediastinal or hilar adenopathy. Unremarkable
CT appearance of the esophagus, trachea and mainstem bronchi. No
thyromegaly.

Lungs/Pleura: Lungs are clear. No pleural effusion or pneumothorax.

Musculoskeletal: Mild disc space narrowing of the mid to lower
thoracic spine from T4 caudad through T11. No acute nor suspicious
osseous abnormalities. No chest wall abnormality noted.

CT ABDOMEN PELVIS FINDINGS

Hepatobiliary: Hepatic steatosis. Physiologically distended
gallbladder with areas of focal fatty sparing about the gallbladder
fossa. No gallstones or biliary dilatation.

Pancreas: Normal

Spleen: Normal

Adrenals/Urinary Tract: Normal bilateral adrenal glands. Stable 14
mm water attenuating cyst off the lower pole the right kidney. Tiny
exophytic too small to further characterize hypodensities are also
noted of the left kidney, the largest approximately 6 mm. Punctate
cortical calcification involving the interpolar cortex of the right
kidney consistent with nephrolithiasis.

Stomach/Bowel: Contracted stomach with normal ligament of Treitz
position. No bowel obstruction or inflammation. Appendectomy by
report.

Vascular/Lymphatic: Aortoiliac atherosclerosis without aneurysm.
Mild haziness of the central mesentery with small subcentimeter
lymph nodes may represent mild sclerosing mesenteritis. No
lymphadenopathy.

Reproductive: Normal prostate and seminal vesicles.

Other: No abdominal wall hernia or abnormality. No abdominopelvic
ascites.

Musculoskeletal: Degenerative disc disease L4-5 with grade 1
anterolisthesis of L4 on L5. No acute nor suspicious osseous
abnormalities. Sclerotic foci of the left pubic symphysis,
acetabular roofs and right femoral neck consistent with bone
islands.
IMPRESSION: 1. Coronary arteriosclerosis along the circumflex.
2. Minimal aortic atherosclerosis.
3. Hepatic steatosis.
4. Small bilateral renal cysts, some are too small to further
characterize. Nonobstructing right-sided punctate renal calculus.
5. Faint hazy appearance of the central mesentery with small lymph
nodes, chronic in appearance and nonspecific but may reflect a
sclerosing mesenteritis.

## 2019-06-08 IMAGING — DX DG CHEST 1V PORT
1 series · 1 of 1 positions shown · non-contrast
Comparison: Chest x-ray dated 03/31/2015.

CLINICAL DATA: Pt c/o left side face, left side neck and left leg
numbness. Hx HTN, nonsmoker.

EXAM:
PORTABLE CHEST 1 VIEW

[chest ap]
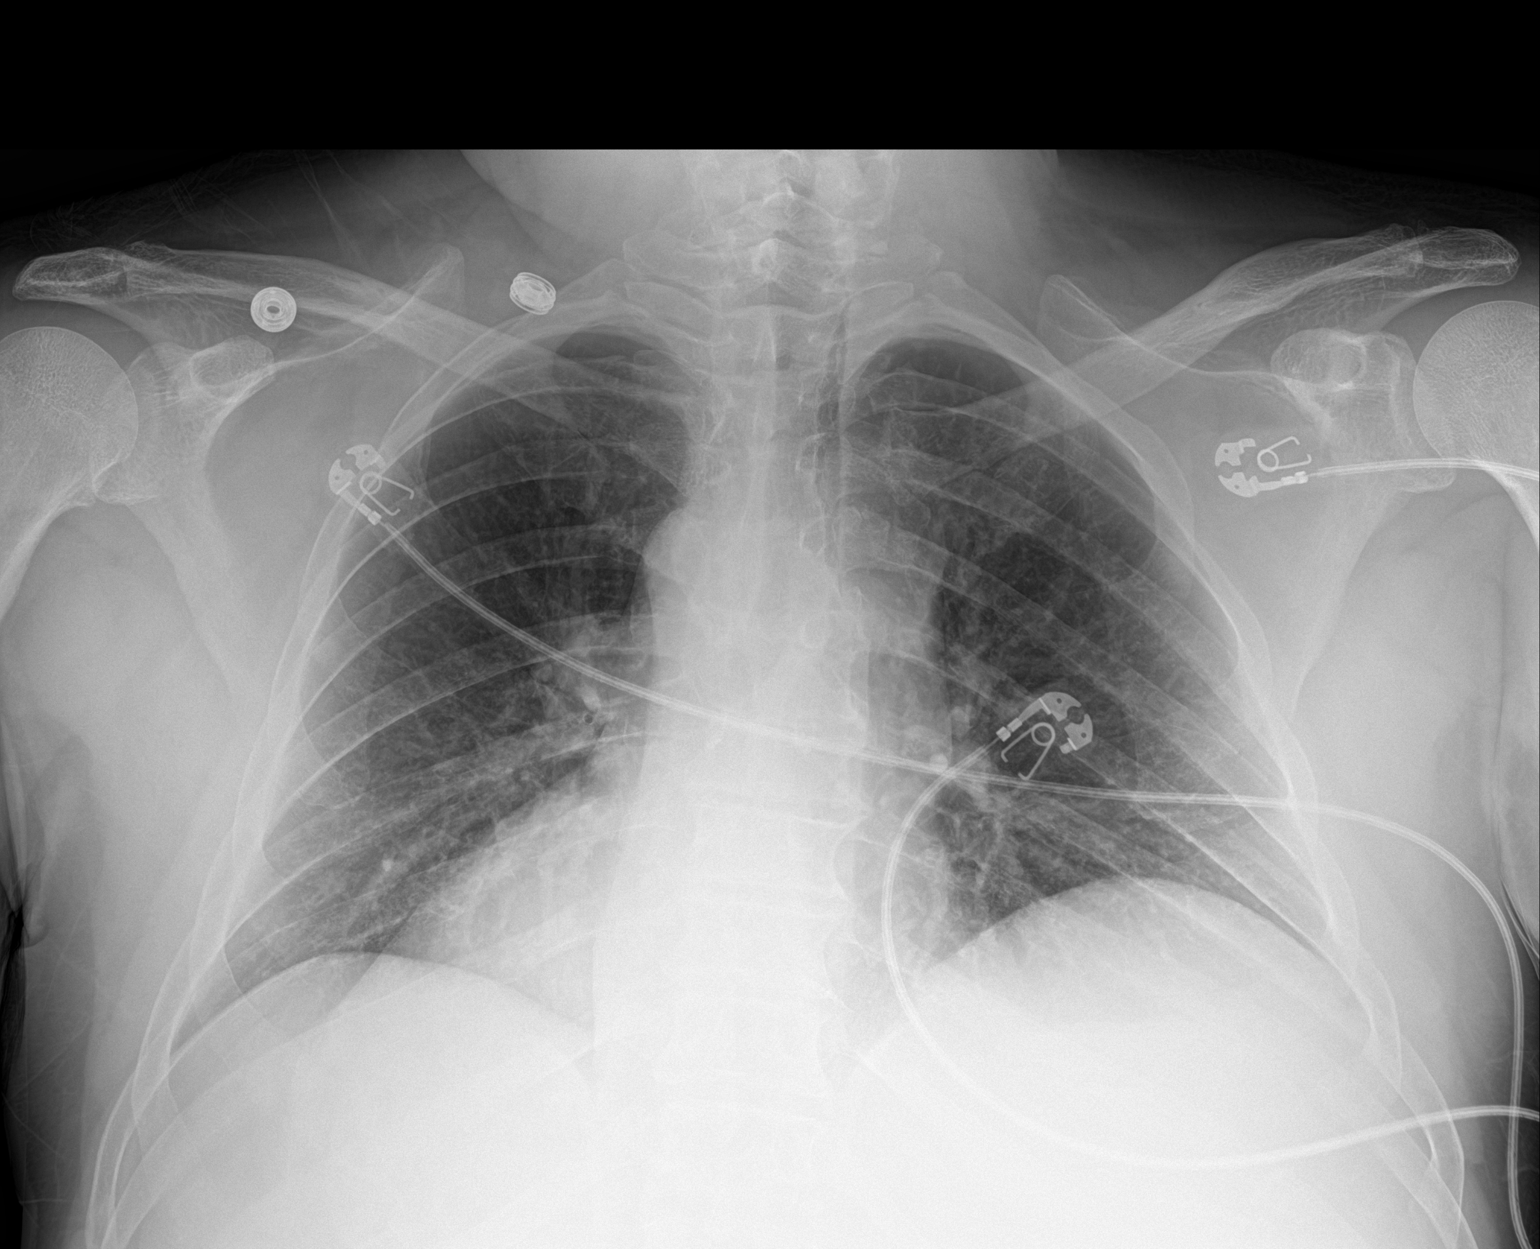

[1 of 1 positions shown; findings below may reference images not displayed]

FINDINGS: Study is hypoinspiratory with crowding of the perihilar and
bibasilar bronchovascular markings. Given the low lung volumes,
lungs are clear. No pleural effusion or pneumothorax seen. Heart
size and mediastinal contours are within normal limits. Osseous
structures about the chest are unremarkable.
IMPRESSION: No active disease. No evidence of pneumonia or pulmonary edema. Low
lung volumes.

## 2019-06-08 IMAGING — CT CT HEAD W/O CM
4 series · 16 of 47 positions shown, 18 images · non-contrast
Comparison: CT 10/14/2014

CLINICAL DATA: Hip pain

EXAM:
CT HEAD WITHOUT CONTRAST
TECHNIQUE: Contiguous axial images were obtained from the base of the skull
through the vertex without intravenous contrast.

[Series 3: head without · axial · non-contrast · 0.45mm/px · z∈[-164,-39]mm · 6 of 35 slices shown, 8 images]
[im 5/35  brain]
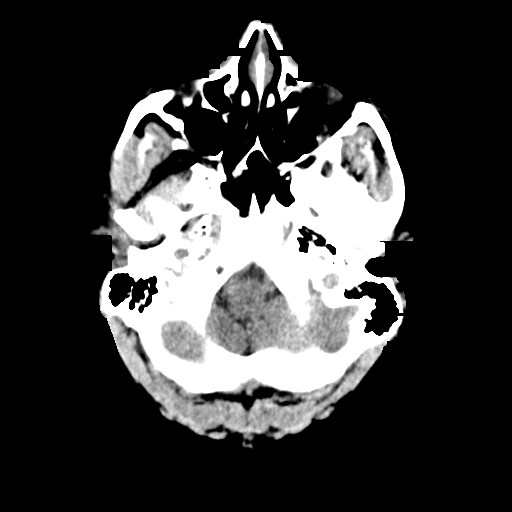
[im 5/35  bone]
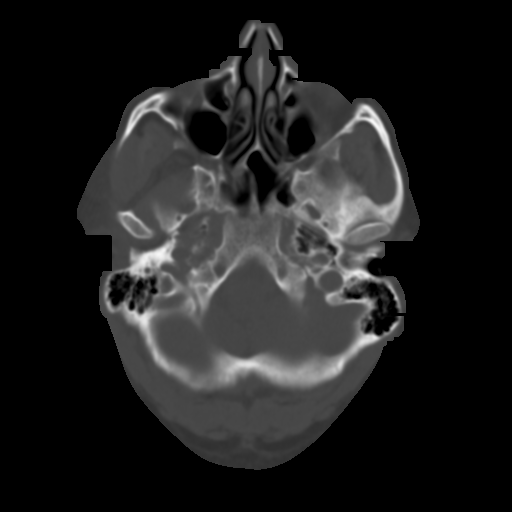
[im 10/35  brain]
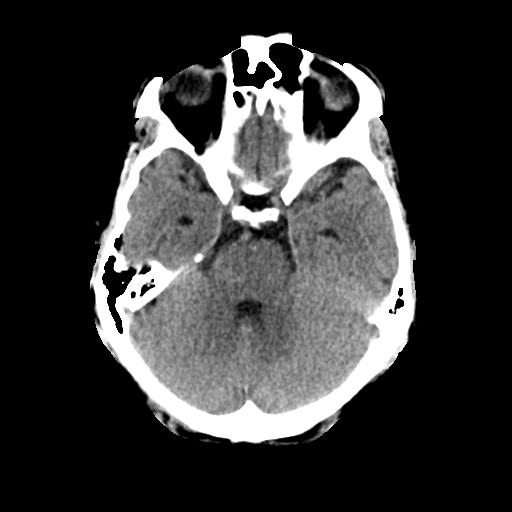
[im 15/35  brain]
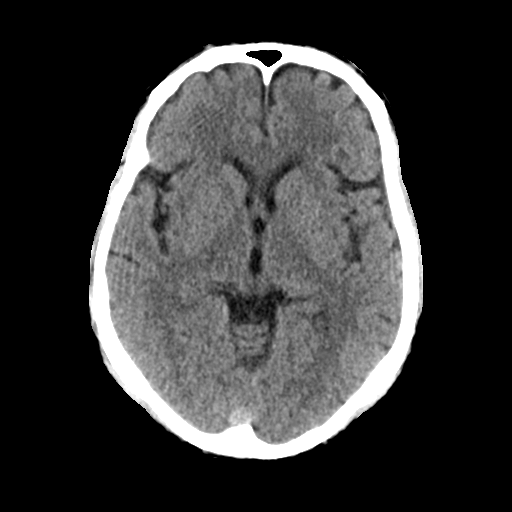
[im 20/35  brain]
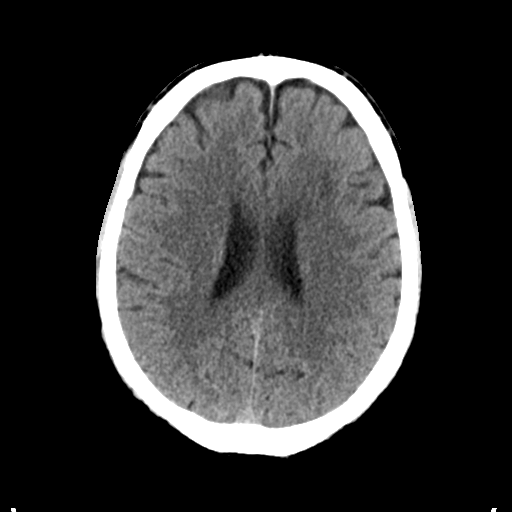
[im 25/35  brain]
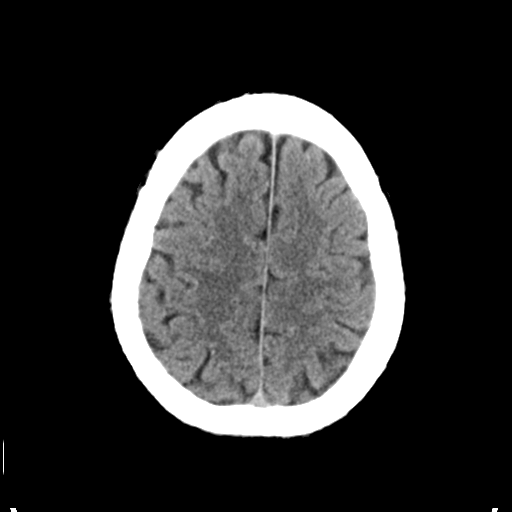
[im 25/35  bone]
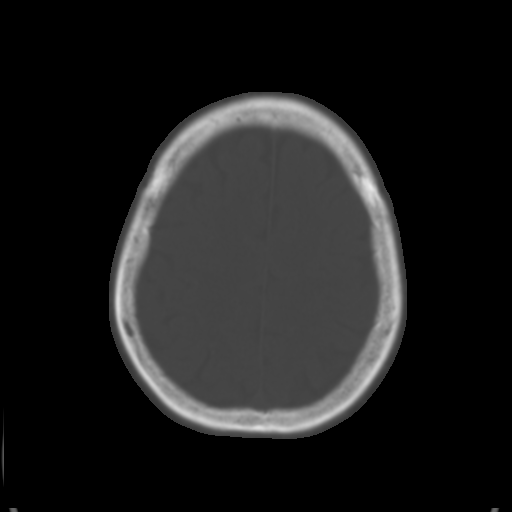
[im 30/35  brain]
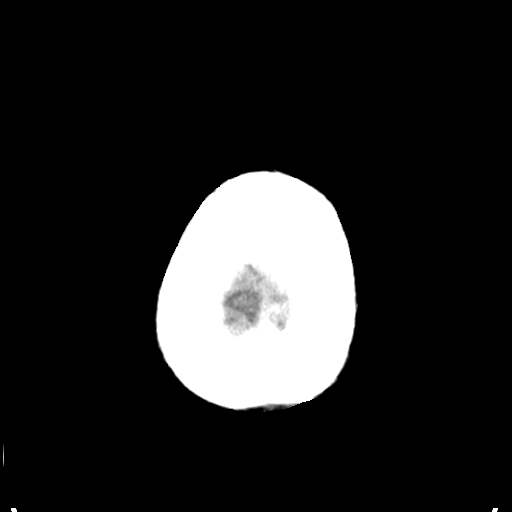

[Series 4: head bone · axial · 0.45mm/px · z∈[-168,-110]mm · 4 of 89 slices shown]
[im 9/89  bone]
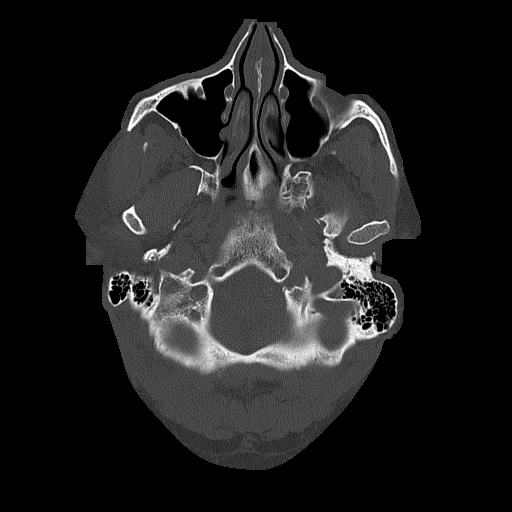
[im 17/89  bone]
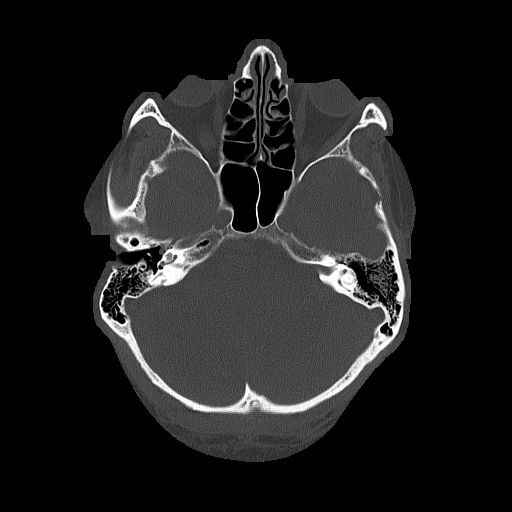
[im 30/89  bone]
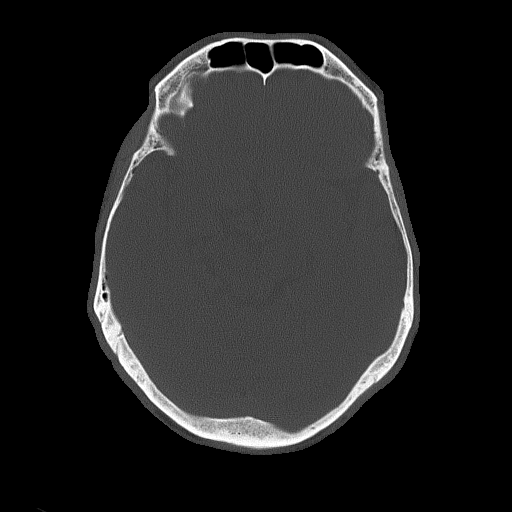
[im 38/89  bone]
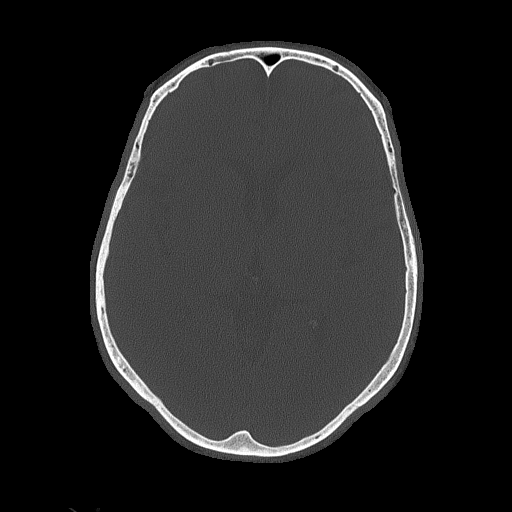

[Series 5: head without cor · coronal · non-contrast · 0.35mm/px · 3 of 76 slices shown]
[im 26/76  brain]
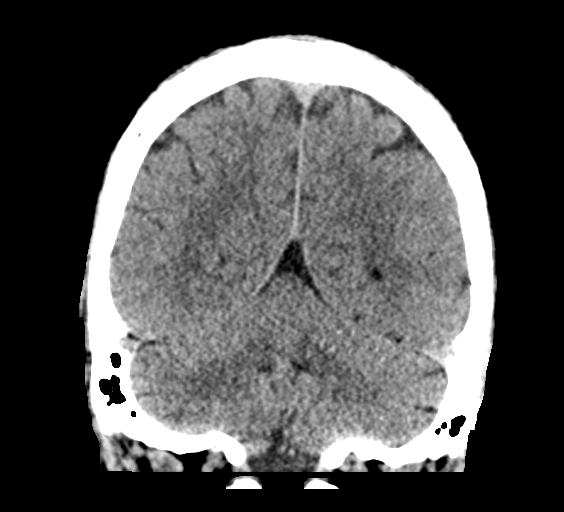
[im 34/76  brain]
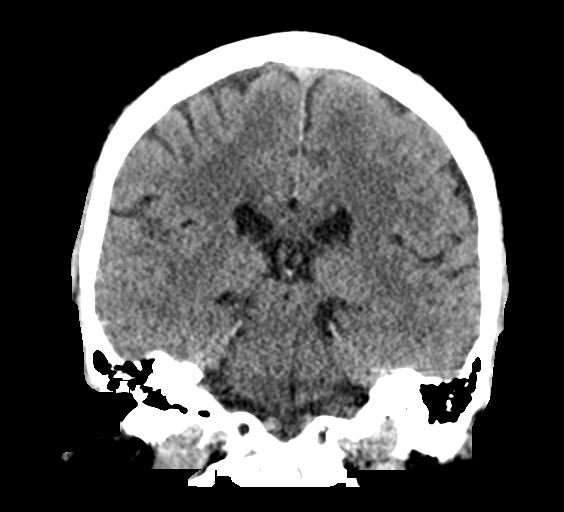
[im 42/76  brain]
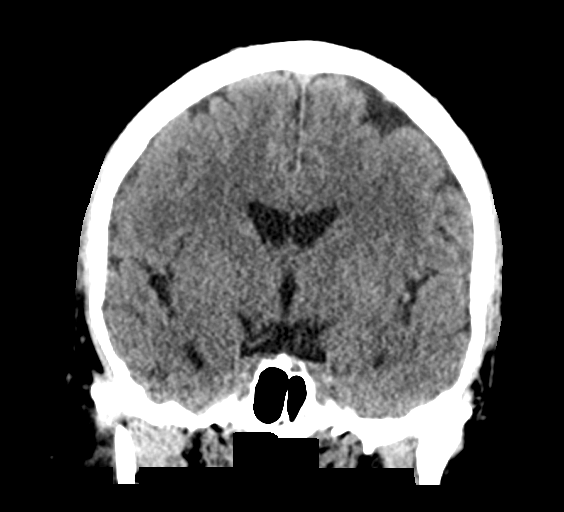

[Series 6: head without sag · sagittal · non-contrast · 0.35mm/px · 3 of 67 slices shown]
[im 23/67  brain]
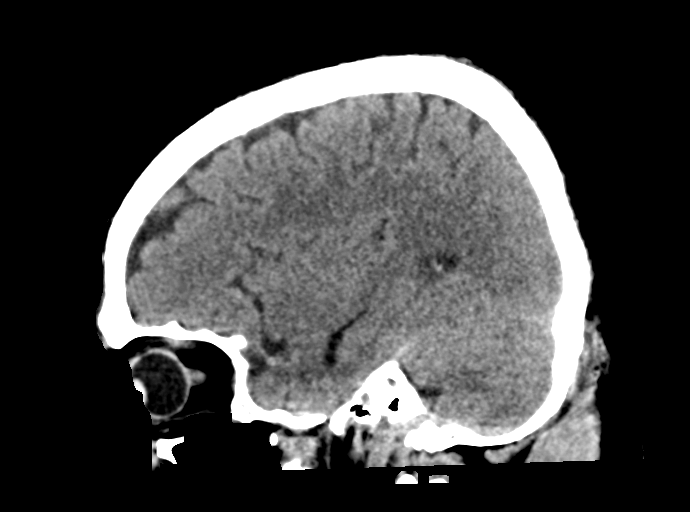
[im 34/67  brain]
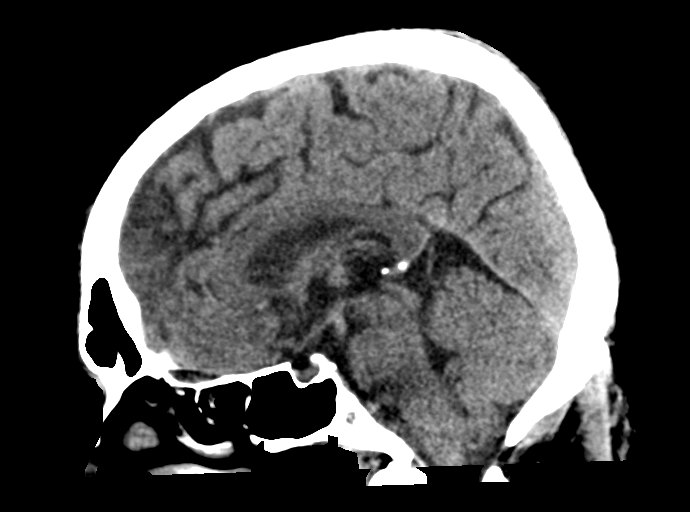
[im 45/67  brain]
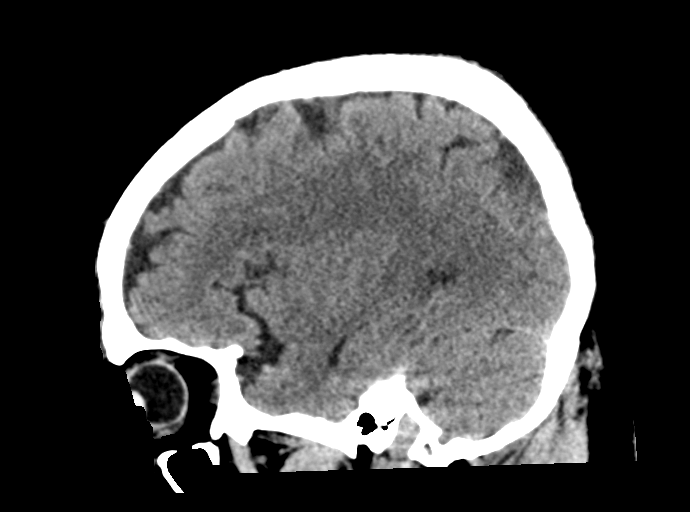

[16 of 47 positions shown; findings below may reference images not displayed]

FINDINGS: Brain: No evidence of acute infarction, hemorrhage, hydrocephalus,
extra-axial collection or mass lesion/mass effect.

Vascular: Negative for hyperdense vessel

Skull: Negative

Sinuses/Orbits: Negative

Other: None
IMPRESSION: Negative CT head

## 2020-04-13 ENCOUNTER — Ambulatory Visit (HOSPITAL_COMMUNITY)
Admission: RE | Admit: 2020-04-13 | Discharge: 2020-04-13 | Disposition: A | Discharge status: Home / Self Care | Payer: 59 | Source: Ambulatory Visit | Attending: Internal Medicine | Admitting: Internal Medicine

## 2020-04-13 ENCOUNTER — Other Ambulatory Visit (HOSPITAL_COMMUNITY): Payer: Self-pay | Admitting: Internal Medicine

## 2020-04-13 ENCOUNTER — Other Ambulatory Visit: Payer: Self-pay

## 2020-04-13 ENCOUNTER — Other Ambulatory Visit: Payer: Self-pay | Admitting: Internal Medicine

## 2020-04-13 DIAGNOSIS — M79605 Pain in left leg: Secondary | ICD-10-CM | POA: Diagnosis not present

## 2020-08-04 ENCOUNTER — Other Ambulatory Visit: Payer: Self-pay

## 2020-08-04 ENCOUNTER — Ambulatory Visit (HOSPITAL_COMMUNITY)
Admission: RE | Admit: 2020-08-04 | Discharge: 2020-08-04 | Disposition: A | Discharge status: Home / Self Care | Payer: 59 | Source: Ambulatory Visit | Attending: Internal Medicine | Admitting: Internal Medicine

## 2020-08-04 ENCOUNTER — Other Ambulatory Visit (HOSPITAL_COMMUNITY): Payer: Self-pay | Admitting: Internal Medicine

## 2020-08-04 DIAGNOSIS — R509 Fever, unspecified: Secondary | ICD-10-CM

## 2020-10-19 NOTE — Patient Instructions (Addendum)
DUE TO COVID-19 ONLY ONE VISITOR IS ALLOWED TO COME WITH YOU AND STAY IN THE WAITING ROOM ONLY DURING PRE OP AND PROCEDURE DAY OF SURGERY. THE 1 VISITOR  MAY VISIT WITH YOU AFTER SURGERY IN YOUR PRIVATE ROOM DURING VISITING HOURS ONLY!  YOU NEED TO HAVE A COVID 19 TEST ON: 10/27/20 @ 10:00 AM , THIS TEST MUST BE DONE BEFORE SURGERY,  COVID TESTING SITE [place] [address], IT IS ON THE RIGHT GOING OUT [address] APPROXIMATELY  2 MINUTES PAST [employer] ON THE RIGHT. ONCE YOUR COVID TEST IS COMPLETED,  PLEASE BEGIN THE QUARANTINE INSTRUCTIONS AS OUTLINED IN YOUR HANDOUT.                Travis [name]   Your procedure is scheduled on: 10/30/20   Report to [facility] Main  Entrance   Report to admitting at : 7:00 AM     Call this number if you have problems the morning of surgery [phone]    Remember: Do not eat solid food :After Midnight. Clear liquids until: 6:30 am.  CLEAR LIQUID DIET  Foods Allowed                                                                     Foods Excluded  Coffee and tea, regular and decaf                             liquids that you cannot  Plain Jell-O any favor except red or purple                                           see through such as: Fruit ices (not with fruit pulp)                                     milk, soups, orange juice  Iced Popsicles                                    All solid food Carbonated beverages, regular and diet                                    Cranberry, grape and apple juices Sports drinks like Gatorade Lightly seasoned clear broth or consume(fat free) Sugar, honey syrup  Sample Menu Breakfast                                Lunch                                     Supper Cranberry juice  Beef broth                            Chicken broth Jell-O                                     Grape juice                           Apple juice Coffee or tea                         Jell-O                                      Popsicle                                                Coffee or tea                        Coffee or tea  _____________________________________________________________________  BRUSH YOUR TEETH MORNING OF SURGERY AND RINSE YOUR MOUTH OUT, NO CHEWING GUM CANDY OR MINTS.    Take these medicines the morning of surgery with A SIP OF WATER:amlodipine,buspirone,fluoxetine,loratadine,pantoprazole,flomax.   How to Manage Your Diabetes Before and After Surgery  Why is it important to control my blood sugar before and after surgery? . Improving blood sugar levels before and after surgery helps healing and can limit problems. . A way of improving blood sugar control is eating a healthy diet by: o  Eating less sugar and carbohydrates o  Increasing activity/exercise o  Talking with your doctor about reaching your blood sugar goals . High blood sugars (greater than 180 mg/dL) can raise your risk of infections and slow your recovery, so you will need to focus on controlling your diabetes during the weeks before surgery. . Make sure that the doctor who takes care of your diabetes knows about your planned surgery including the date and location.  How do I manage my blood sugar before surgery? . Check your blood sugar at least 4 times a day, starting 2 days before surgery, to make sure that the level is not too high or low. o Check your blood sugar the morning of your surgery when you wake up and every 2 hours until you get to the Short Stay unit. . If your blood sugar is less than 70 mg/dL, you will need to treat for low blood sugar: o Do not take insulin. o Treat a low blood sugar (less than 70 mg/dL) with  cup of clear juice (cranberry or apple), 4 glucose tablets, OR glucose gel. o Recheck blood sugar in 15 minutes after treatment (to make sure it is greater than 70 mg/dL). If your blood sugar is not greater than 70 mg/dL on recheck, call  [phone] for further instructions. . Report your blood sugar to the short stay nurse when you get to Short Stay.  . If you are admitted to the hospital after surgery: o Your blood sugar will be checked by the staff and you will probably be given insulin  after surgery (instead of oral diabetes medicines) to make sure you have good blood sugar levels. o The goal for blood sugar control after surgery is 80-180 mg/dL.   WHAT DO I DO ABOUT MY DIABETES MEDICATION?  Marland Kitchen Do not take oral diabetes medicines (pills) the morning of surgery.  . THE DAY BEFORE SURGERY, take Metyformin and Glipizide as usual.       . THE MORNING OF SURGERY, DO NOT TAKE ANY DIABETIC MEDICATIONS DAY OF YOUR SURGERY                               You may not have any metal on your body including hair pins and              piercings  Do not wear jewelry, lotions, powders or perfumes, deodorant             Men may shave face and neck.   Do not bring valuables to the hospital. [place].  Contacts, dentures or bridgework may not be worn into surgery.  Leave suitcase in the car. After surgery it may be brought to your room.     Patients discharged the day of surgery will not be allowed to drive home. IF YOU ARE HAVING SURGERY AND GOING HOME THE SAME DAY, YOU MUST HAVE AN ADULT TO DRIVE YOU HOME AND BE WITH YOU FOR 24 HOURS. YOU MAY GO HOME BY TAXI OR UBER OR ORTHERWISE, BUT AN ADULT MUST ACCOMPANY YOU HOME AND STAY WITH YOU FOR 24 HOURS.  Name and phone number of your driver:  Special Instructions: N/A              Please read over the following fact sheets you were given: _____________________________________________________________________   PLEASE BRING CPAP MASK [place]. DEVICE WILL BE PROVIDED!       [place] - Preparing for Surgery Before surgery, you can play an important role.  Because skin is not sterile, your skin needs to be as free of germs as  possible.  You can reduce the number of germs on your skin by washing with CHG (chlorahexidine gluconate) soap before surgery.  CHG is an antiseptic cleaner which kills germs and bonds with the skin to continue killing germs even after washing. Please DO NOT use if you have an allergy to CHG or antibacterial soaps.  If your skin becomes reddened/irritated stop using the CHG and inform your nurse when you arrive at Short Stay. Do not shave (including legs and underarms) for at least 48 hours prior to the first CHG shower.  You may shave your face/neck. Please follow these instructions carefully:  1.  Shower with CHG Soap the night before surgery and the  morning of Surgery.  2.  If you choose to wash your hair, wash your hair first as usual with your  normal  shampoo.  3.  After you shampoo, rinse your hair and body thoroughly to remove the  shampoo.                           4.  Use CHG as you would any other liquid soap.  You can apply chg directly  to the skin and wash  Gently with a scrungie or clean washcloth.  5.  Apply the CHG Soap to your body ONLY FROM THE NECK DOWN.   Do not use on face/ open                           Wound or open sores. Avoid contact with eyes, ears mouth and genitals (private parts).                       Wash face,  Genitals (private parts) with your normal soap.             6.  Wash thoroughly, paying special attention to the area where your surgery  will be performed.  7.  Thoroughly rinse your body with warm water from the neck down.  8.  DO NOT shower/wash with your normal soap after using and rinsing off  the CHG Soap.                9.  Pat yourself dry with a clean towel.            10.  Wear clean pajamas.            11.  Place clean sheets on your bed the night of your first shower and do not  sleep with pets. Day of Surgery : Do not apply any lotions/deodorants the morning of surgery.  Please wear clean clothes to the hospital/surgery  center.  FAILURE TO FOLLOW THESE INSTRUCTIONS MAY RESULT IN THE CANCELLATION OF YOUR SURGERY PATIENT SIGNATURE_________________________________  NURSE SIGNATURE__________________________________  ________________________________________________________________________   Adam Phenix  An incentive spirometer is a tool that can help keep your lungs clear and active. This tool measures how well you are filling your lungs with each breath. Taking long deep breaths may help reverse or decrease the chance of developing breathing (pulmonary) problems (especially infection) following:  A long period of time when you are unable to move or be active. BEFORE THE PROCEDURE   If the spirometer includes an indicator to show your best effort, your nurse or respiratory therapist will set it to a desired goal.  If possible, sit up straight or lean slightly forward. Try not to slouch.  Hold the incentive spirometer in an upright position. INSTRUCTIONS FOR USE  1. Sit on the edge of your bed if possible, or sit up as far as you can in bed or on a chair. 2. Hold the incentive spirometer in an upright position. 3. Breathe out normally. 4. Place the mouthpiece in your mouth and seal your lips tightly around it. 5. Breathe in slowly and as deeply as possible, raising the piston or the ball toward the top of the column. 6. Hold your breath for 3-5 seconds or for as long as possible. Allow the piston or ball to fall to the bottom of the column. 7. Remove the mouthpiece from your mouth and breathe out normally. 8. Rest for a few seconds and repeat Steps 1 through 7 at least 10 times every 1-2 hours when you are awake. Take your time and take a few normal breaths between deep breaths. 9. The spirometer may include an indicator to show your best effort. Use the indicator as a goal to work toward during each repetition. 10. After each set of 10 deep breaths, practice coughing to be sure your lungs are  clear. If you have an incision (the cut made at the time of  surgery), support your incision when coughing by placing a pillow or rolled up towels firmly against it. Once you are able to get out of bed, walk around indoors and cough well. You may stop using the incentive spirometer when instructed by your caregiver.  RISKS AND COMPLICATIONS  Take your time so you do not get dizzy or light-headed.  If you are in pain, you may need to take or ask for pain medication before doing incentive spirometry. It is harder to take a deep breath if you are having pain. AFTER USE  Rest and breathe slowly and easily.  It can be helpful to keep track of a log of your progress. Your caregiver can provide you with a simple table to help with this. If you are using the spirometer at home, follow these instructions: [place] IF:   You are having difficultly using the spirometer.  You have trouble using the spirometer as often as instructed.  Your pain medication is not giving enough relief while using the spirometer.  You develop fever of 100.5 F (38.1 C) or higher. SEEK IMMEDIATE MEDICAL CARE IF:   You cough up bloody sputum that had not been present before.  You develop fever of 102 F (38.9 C) or greater.  You develop worsening pain at or near the incision site. MAKE SURE YOU:   Understand these instructions.  Will watch your condition.  Will get help right away if you are not doing well or get worse. Document Released: 12/23/2006 Document Revised: 11/04/2011 Document Reviewed: 02/23/2007 [facility] Patient Information 2014 [place], [place].   ________________________________________________________________________

## 2020-10-20 ENCOUNTER — Encounter (HOSPITAL_COMMUNITY): Payer: Self-pay

## 2020-10-20 ENCOUNTER — Other Ambulatory Visit: Payer: Self-pay

## 2020-10-20 ENCOUNTER — Encounter (HOSPITAL_COMMUNITY)
Admission: RE | Admit: 2020-10-20 | Discharge: 2020-10-20 | Disposition: A | Discharge status: Home / Self Care | Payer: 59 | Source: Ambulatory Visit | Attending: Orthopedic Surgery | Admitting: Orthopedic Surgery

## 2020-10-20 DIAGNOSIS — Z01818 Encounter for other preprocedural examination: Secondary | ICD-10-CM | POA: Insufficient documentation

## 2020-10-20 HISTORY — DX: Dyspnea, unspecified: R06.00

## 2020-10-20 HISTORY — DX: Unspecified osteoarthritis, unspecified site: M19.90

## 2020-10-20 LAB — CBC
HCT: 45.4 % (ref 39.0–52.0)
Hemoglobin: 14.6 g/dL (ref 13.0–17.0)
MCH: 28.7 pg (ref 26.0–34.0)
MCHC: 32.2 g/dL (ref 30.0–36.0)
MCV: 89.4 fL (ref 80.0–100.0)
Platelets: 187 10*3/uL (ref 150–400)
RBC: 5.08 MIL/uL (ref 4.22–5.81)
RDW: 13.2 % (ref 11.5–15.5)
WBC: 9.2 10*3/uL (ref 4.0–10.5)
nRBC: 0 % (ref 0.0–0.2)

## 2020-10-20 LAB — COMPREHENSIVE METABOLIC PANEL
ALT: 30 U/L (ref 0–44)
AST: 26 U/L (ref 15–41)
Albumin: 4.1 g/dL (ref 3.5–5.0)
Alkaline Phosphatase: 49 U/L (ref 38–126)
Anion gap: 12 (ref 5–15)
BUN: 17 mg/dL (ref 8–23)
CO2: 23 mmol/L (ref 22–32)
Calcium: 9.3 mg/dL (ref 8.9–10.3)
Chloride: 106 mmol/L (ref 98–111)
Creatinine, Ser: 0.99 mg/dL (ref 0.61–1.24)
GFR, Estimated: >60 mL/min (ref >60)
Glucose, Bld: 99 mg/dL (ref 70–99)
Potassium: 4.2 mmol/L (ref 3.5–5.1)
Sodium: 141 mmol/L (ref 135–145)
Total Bilirubin: 0.6 mg/dL (ref 0.3–1.2)
Total Protein: 7.1 g/dL (ref 6.5–8.1)

## 2020-10-20 LAB — APTT: aPTT: 28 seconds (ref 24–36)

## 2020-10-20 LAB — SURGICAL PCR SCREEN
MRSA, PCR: NEGATIVE
Staphylococcus aureus: NEGATIVE

## 2020-10-20 LAB — HEMOGLOBIN A1C
Hgb A1c MFr Bld: 7.6 % — ABNORMAL HIGH (ref 4.8–5.6)
Mean Plasma Glucose: 171.42 mg/dL

## 2020-10-20 LAB — PROTIME-INR
INR: 1 (ref 0.8–1.2)
Prothrombin Time: 12.3 seconds (ref 11.4–15.2)

## 2020-10-20 LAB — GLUCOSE, CAPILLARY: Glucose-Capillary: 101 mg/dL — ABNORMAL HIGH (ref 70–99)

## 2020-10-20 NOTE — Progress Notes (Signed)
COVID Vaccine Completed: Yes Date COVID Vaccine completed: 08/17/20 [place] COVID vaccine manufacturer: Pfizer    PCP - Dr. Redmond School Cardiologist -   Chest x-ray - 08/04/20 EKG -  Stress Test -  ECHO -  Cardiac Cath -  Pacemaker/ICD device last checked:  Sleep Study - Yes CPAP -  Yes  Fasting Blood Sugar - 150's Checks Blood Sugar __2___ times a day  Blood Thinner Instructions: Aspirin Instructions: Last Dose:  Anesthesia review: Hx: HTN,OSA(CPAP)  Patient denies shortness of breath, fever, cough and chest pain at PAT appointment   Patient verbalized understanding of instructions that were given to them at the PAT appointment. Patient was also instructed that they will need to review over the PAT instructions again at home before surgery.

## 2020-10-27 ENCOUNTER — Other Ambulatory Visit (HOSPITAL_COMMUNITY)
Admission: RE | Admit: 2020-10-27 | Discharge: 2020-10-27 | Disposition: A | Discharge status: Home / Self Care | Payer: 59 | Source: Ambulatory Visit | Attending: Orthopedic Surgery | Admitting: Orthopedic Surgery

## 2020-10-27 DIAGNOSIS — Z20822 Contact with and (suspected) exposure to covid-19: Secondary | ICD-10-CM | POA: Diagnosis not present

## 2020-10-27 DIAGNOSIS — Z01812 Encounter for preprocedural laboratory examination: Secondary | ICD-10-CM | POA: Diagnosis present

## 2020-10-27 LAB — SARS CORONAVIRUS 2 (TAT 6-24 HRS): SARS Coronavirus 2: NEGATIVE

## 2020-10-27 NOTE — Progress Notes (Signed)
Patient notified of surgery time change. Instructed to arrive at 1115. Pre surgery drink (G2) at 1045. [name] verbalized understanding.

## 2020-10-29 MED ORDER — BUPIVACAINE LIPOSOME 1.3 % IJ SUSP
20.0000 mL | Freq: Once | INTRAMUSCULAR | Status: AC
  Filled 2020-10-29: qty 20

## 2020-10-29 NOTE — H&P (Signed)
TOTAL KNEE ADMISSION H&P  Patient is being admitted for right total knee arthroplasty.  Subjective:  Chief Complaint:right knee pain.  HPI: Travis [name], 60 y.o. male, has a history of pain and functional disability in the right knee due to arthritis and has failed non-surgical conservative treatments for greater than 12 weeks to includecorticosteriod injections and activity modification.  Onset of symptoms was gradual, starting 3 years ago with gradually worsening course since that time. The patient noted prior procedures on the knee to include  menisectomy on the right knee(s).  Patient currently rates pain in the right knee(s) at 7 out of 10 with activity. Patient has worsening of pain with activity and weight bearing and pain that interferes with activities of daily living.  Patient has evidence of joint space narrowing by imaging studies.  There is no active infection.  Patient Active Problem List   Diagnosis Date Noted   Major depressive disorder, recurrent episode, severe, with psychosis ([place]) 04/19/2017   MDD (major depressive disorder) 04/16/2017   AKI (acute kidney injury) ([place])    Lactic acid acidosis    Left sided numbness    Sepsis ([facility])    Elevated lactic acid level    Leukocytosis    Volume depletion    Hypotension 03/13/2017   Chest pain with moderate risk for cardiac etiology 01/13/2014   Dyspnea 01/13/2014   Family history of coronary artery disease- (mother had MI 32) 01/13/2014   Tobacco chew use    Type 2 diabetes mellitus ([place])    Dyslipidemia    Unspecified vitamin D deficiency    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 pylori infection 09/15/2006    Hematochezia 08/30/2007   History of nuclear stress test 04/03/2009   exercise myoview; normal pattern of perfusion; low risk scan    Hypertension    treated   NIDDM (non-insulin dependent diabetes mellitus) 2011   type 2   Schizoaffective disorder ([place])    Tobacco chew use    Unspecified vitamin D deficiency     Past Surgical History:  Procedure Laterality Date   APPENDECTOMY     HERNIA REPAIR     RIGHT   KNEE SURGERY     BOTH   TRANSTHORACIC ECHOCARDIOGRAM  04/03/2009   EF=>55%; trace TR, mild pulm regurg    Current Facility-Administered Medications  Medication Dose Route Frequency Provider Last Rate Last Admin   [START ON 10/30/2020] bupivacaine liposome (EXPAREL) 1.3 % injection 266 mg  20 mL Other Once Gaynelle Arabian, MD       Current Outpatient Medications  Medication Sig Dispense Refill Last Dose   acetaminophen (TYLENOL) 500 MG tablet Take 1,000 mg by mouth every morning.      amLODipine (NORVASC) 10 MG tablet Take 1 tablet (10 mg total) by mouth daily. For high blood pressure 10 tablet 0    atorvastatin (LIPITOR) 20 MG tablet Take 1 tablet (20 mg total) by mouth daily. For high cholesterol (Patient taking differently: Take 20 mg by mouth every evening. For high cholesterol) 7 tablet 0    carbamazepine (TEGRETOL) 200 MG tablet Take 200 mg by mouth 2 (two) times daily.  docusate sodium (COLACE) 100 MG capsule Take 1 capsule (100 mg total) by mouth daily. (May buy from over the counter): For constipation (Patient taking differently: Take 100 mg by mouth daily as needed for mild constipation.) 10 capsule 0    FLUoxetine (PROZAC) 20 MG capsule Take 60 mg by mouth every morning.      furosemide (LASIX) 20 MG tablet Take 20 mg by mouth daily.      glipiZIDE (GLUCOTROL) 10 MG tablet Take 10 mg by mouth 2 (two) times daily.      HYDROcodone-acetaminophen (NORCO) 10-325 MG tablet Take 0.5-1 tablets by mouth every 6 (six) hours as needed for pain.       hydrOXYzine (ATARAX/VISTARIL) 25 MG tablet Take 1 tablet (25 mg total) by mouth 3 (three) times daily as needed for anxiety. 60 tablet 0    KLOR-CON M20 20 MEQ tablet Take 20 mEq by mouth at bedtime.      loratadine (CLARITIN) 10 MG tablet Take 1 tablet (10 mg total) by mouth daily. (May buy from over the counter): For allergies (Patient taking differently: Take 10 mg by mouth at bedtime. (May buy from over the counter): For allergies)      losartan (COZAAR) 50 MG tablet Take 50 mg by mouth 2 (two) times daily.      metFORMIN (GLUCOPHAGE) 1000 MG tablet Take 1 tablet (1,000 mg total) by mouth daily. For diabetes management (Patient taking differently: Take 1,000 mg by mouth 2 (two) times daily with a meal. For diabetes management) 10 tablet 0    nitroGLYCERIN (NITROSTAT) 0.4 MG SL tablet Place 1 tablet (0.4 mg total) under the tongue every 5 (five) minutes as needed for chest pain.  0    pantoprazole (PROTONIX) 40 MG tablet Take 1 tablet (40 mg total) by mouth daily. For acid reflux (Patient taking differently: Take 40 mg by mouth at bedtime. For acid reflux) 10 tablet 0    pramipexole (MIRAPEX) 1.5 MG tablet Take 1.5 mg by mouth at bedtime.      tamsulosin (FLOMAX) 0.4 MG CAPS capsule Take 1 capsule (0.4 mg total) by mouth daily after breakfast. For prostate health 7 capsule 0    traMADol (ULTRAM) 50 MG tablet Take 50 mg by mouth every 12 (twelve) hours as needed for pain.      traZODone (DESYREL) 100 MG tablet Take 1 tablet (100 mg total) by mouth at bedtime. For sleep 30 tablet 0    aspirin EC 81 MG EC tablet Take 1 tablet (81 mg total) by mouth daily. (May buy from over the counter): For heart health (Patient not taking: No sig reported) 30 tablet 0 Not Taking at Unknown time   Aspirin-Acetaminophen-Caffeine (EXCEDRIN PO) Take 2 tablets by mouth daily. (Patient not taking: Reported on 10/20/2020)   Not Taking at Unknown time   busPIRone (BUSPAR) 15 MG tablet Take 1 tablet (15 mg total)  by mouth 3 (three) times daily. For anxiety (Patient not taking: No sig reported) 90 tablet 0 Not Taking at Unknown time   FLUoxetine (PROZAC) 40 MG capsule Take 1 capsule (40 mg total) by mouth daily. For depression (Patient not taking: No sig reported) 30 capsule 0 Not Taking at Unknown time   glipiZIDE (GLUCOTROL XL) 5 MG 24 hr tablet Take 1 tablet (5 mg total) by mouth daily. For diabetes management (Patient not taking: No sig reported) 10 tablet 0 Not Taking at Unknown time   nicotine (NICODERM CQ - DOSED IN MG/24 HOURS) 21 mg/24hr  patch Place 1 patch (21 mg total) onto the skin daily. (May buy from over the counter): For smoking cessation (Patient not taking: No sig reported) 28 patch 0 Not Taking at Unknown time   Allergies  Allergen Reactions   Doxycycline Other (See Comments)    Reaction:  Blisters    Gabapentin     Other reaction(s): Hallucinations, Lightheadedness   Methocarbamol     Other reaction(s): Confusion, Hallucinations   Lisinopril Swelling    Social History   Tobacco Use   Smoking status: Never Smoker   Smokeless tobacco: Current User    Types: Snuff, Chew   Tobacco comment: started chewing tobacco at age 86 years old-stopped chewing and dips snuff only  Substance Use Topics   Alcohol use: No    Alcohol/week: 0.0 standard drinks    Family History  Problem Relation Age of Onset   Stroke Father 75   Pneumonia Father    Heart failure Mother 52   Coronary artery disease Mother        also MI   Heart attack Mother    Heart attack Maternal Uncle        died in 57s   Cancer Other        x2; maternal side   Diabetes Maternal Grandmother      Review of Systems  Constitutional: Negative for chills and fever.  Respiratory: Negative for cough and shortness of breath.   Cardiovascular: Negative for chest pain.  Gastrointestinal: Negative for nausea and vomiting.  Musculoskeletal: Positive for arthralgias.    Objective:  Physical  Exam Patient is a 60 year old male.  Well nourished and well developed. General: Alert and oriented x3, cooperative and pleasant, no acute distress. Head: normocephalic, atraumatic, neck supple. Eyes: EOMI. Respiratory: breath sounds clear in all fields, no wheezing, rales, or rhonchi.  Musculoskeletal:  Right Knee Exam: No effusion present. No swelling present. Slight varus deformity. The range of motion is: 5 to 130 degrees. Moderate crepitus on range of motion of the knee. Positive medial greater than lateral joint line tenderness. The knee is stable.  Calves soft and nontender. Motor function intact in LE. Strength 5/5 LE bilaterally. Neuro: Distal pulses 2+. Sensation to light touch intact in LE.  Vital signs in last 24 hours:    Labs:   Estimated body mass index is 32.44 kg/m as calculated from the following:   Height as of 10/20/20: 5\' 4"  (1.626 m).   Weight as of 10/20/20: 85.7 kg.   Imaging Review Plain radiographs demonstrate severe degenerative joint disease of the right knee(s). The overall alignment isneutral. The bone quality appears to be adequate for age and reported activity level.      Assessment/Plan:  End stage arthritis, right knee   The patient history, physical examination, clinical judgment of the provider and imaging studies are consistent with end stage degenerative joint disease of the right knee(s) and total knee arthroplasty is deemed medically necessary. The treatment options including medical management, injection therapy arthroscopy and arthroplasty were discussed at length. The risks and benefits of total knee arthroplasty were presented and reviewed. The risks due to aseptic loosening, infection, stiffness, patella tracking problems, thromboembolic complications and other imponderables were discussed. The patient acknowledged the explanation, agreed to proceed with the plan and consent was signed. Patient is being admitted for inpatient  treatment for surgery, pain control, PT, OT, prophylactic antibiotics, VTE prophylaxis, progressive ambulation and ADL's and discharge planning. The patient is planning to be discharged home.  Therapy Plans: outpatient therapy at [facility] in [place] Disposition: Home with wife Planned DVT Prophylaxis: aspirin 325mg  BID DME needed: walker PCP: Dr. Redmond School, appointment on 2/25 TXA: IV Allergies: doxycycline - blisters, gabapentin - hallucinations, robaxin - hallucinations Anesthesia Concerns: none BMI: 32.4 Last HgbA1c: 8.6% in December, will recheck on 2/25  Other: - NO gabapentin or robaxin (flexeril is okay, was told not to take Tizanidine)    Patient's anticipated LOS is less than 2 midnights, meeting these requirements: - Younger than 49 - Lives within 1 hour of care - Has a competent adult at home to recover with post-op recover - NO history of  - Chronic pain requiring opiods  - Diabetes  - Coronary Artery Disease  - Heart failure  - Heart attack  - Stroke  - DVT/VTE  - Cardiac arrhythmia  - Respiratory Failure/COPD  - Renal failure  - Anemia  - Advanced Liver disease   - Patient was instructed on what medications to stop prior to surgery. - Follow-up visit in 2 weeks with Dr. Wynelle Link - Begin physical therapy following surgery - Pre-operative lab work as pre-surgical testing - Prescriptions will be provided in hospital at time of discharge  Griffith Citron, PA-C Orthopedic Surgery [facility] [phone]

## 2020-10-30 ENCOUNTER — Encounter (HOSPITAL_COMMUNITY)
Admission: RE | Disposition: A | Discharge status: Home / Self Care | Payer: Self-pay | Source: Ambulatory Visit | Attending: Orthopedic Surgery

## 2020-10-30 ENCOUNTER — Ambulatory Visit (HOSPITAL_COMMUNITY): Payer: 59 | Admitting: Certified Registered Nurse Anesthetist

## 2020-10-30 ENCOUNTER — Other Ambulatory Visit: Payer: Self-pay

## 2020-10-30 ENCOUNTER — Observation Stay (HOSPITAL_COMMUNITY)
Admission: RE | Admit: 2020-10-30 | Discharge: 2020-10-31 | Disposition: A | Discharge status: Home / Self Care | Payer: 59 | Source: Ambulatory Visit | Attending: Orthopedic Surgery | Admitting: Orthopedic Surgery

## 2020-10-30 ENCOUNTER — Encounter (HOSPITAL_COMMUNITY): Payer: Self-pay | Admitting: Orthopedic Surgery

## 2020-10-30 DIAGNOSIS — Z7982 Long term (current) use of aspirin: Secondary | ICD-10-CM | POA: Insufficient documentation

## 2020-10-30 DIAGNOSIS — E119 Type 2 diabetes mellitus without complications: Secondary | ICD-10-CM | POA: Diagnosis not present

## 2020-10-30 DIAGNOSIS — M1710 Unilateral primary osteoarthritis, unspecified knee: Secondary | ICD-10-CM | POA: Diagnosis present

## 2020-10-30 DIAGNOSIS — I10 Essential (primary) hypertension: Secondary | ICD-10-CM | POA: Diagnosis not present

## 2020-10-30 DIAGNOSIS — Z7984 Long term (current) use of oral hypoglycemic drugs: Secondary | ICD-10-CM | POA: Diagnosis not present

## 2020-10-30 DIAGNOSIS — M179 Osteoarthritis of knee, unspecified: Secondary | ICD-10-CM | POA: Diagnosis present

## 2020-10-30 DIAGNOSIS — F17220 Nicotine dependence, chewing tobacco, uncomplicated: Secondary | ICD-10-CM | POA: Insufficient documentation

## 2020-10-30 DIAGNOSIS — Z79899 Other long term (current) drug therapy: Secondary | ICD-10-CM | POA: Insufficient documentation

## 2020-10-30 DIAGNOSIS — M1711 Unilateral primary osteoarthritis, right knee: Principal | ICD-10-CM | POA: Diagnosis present

## 2020-10-30 HISTORY — PX: TOTAL KNEE ARTHROPLASTY: SHX125

## 2020-10-30 LAB — GLUCOSE, CAPILLARY
Glucose-Capillary: 136 mg/dL — ABNORMAL HIGH (ref 70–99)
Glucose-Capillary: 141 mg/dL — ABNORMAL HIGH (ref 70–99)
Glucose-Capillary: 238 mg/dL — ABNORMAL HIGH (ref 70–99)
Glucose-Capillary: 252 mg/dL — ABNORMAL HIGH (ref 70–99)

## 2020-10-30 LAB — TYPE AND SCREEN
ABO/RH(D): A POS
Antibody Screen: NEGATIVE

## 2020-10-30 LAB — ABO/RH: ABO/RH(D): A POS

## 2020-10-30 SURGERY — ARTHROPLASTY, KNEE, TOTAL
Anesthesia: Monitor Anesthesia Care | Site: Knee | Laterality: Right

## 2020-10-30 MED ORDER — BUPIVACAINE IN DEXTROSE 0.75-8.25 % IT SOLN
INTRATHECAL | Status: DC | PRN
  Administered 2020-10-30: 1.6 mL via INTRATHECAL

## 2020-10-30 MED ORDER — HYDROMORPHONE HCL 1 MG/ML IJ SOLN: 0.2500 mg | INTRAMUSCULAR | Status: DC | PRN

## 2020-10-30 MED ORDER — INSULIN ASPART 100 UNIT/ML ~~LOC~~ SOLN
0.0000 [IU] | Freq: Three times a day (TID) | SUBCUTANEOUS | Status: DC
  Administered 2020-10-30 – 2020-10-31 (×2): 5 [IU] via SUBCUTANEOUS
  Administered 2020-10-31: 3 [IU] via SUBCUTANEOUS

## 2020-10-30 MED ORDER — POTASSIUM CHLORIDE CRYS ER 20 MEQ PO TBCR
20.0000 meq | EXTENDED_RELEASE_TABLET | Freq: Every day | ORAL | Status: DC
  Administered 2020-10-30 – 2020-10-31 (×2): 20 meq via ORAL
  Filled 2020-10-30 (×2): qty 1

## 2020-10-30 MED ORDER — LACTATED RINGERS IV SOLN: INTRAVENOUS | Status: DC

## 2020-10-30 MED ORDER — PROPOFOL 10 MG/ML IV BOLUS
INTRAVENOUS | Status: DC | PRN
  Administered 2020-10-30: 20 mg via INTRAVENOUS
  Administered 2020-10-30: 10 mg via INTRAVENOUS
  Administered 2020-10-30: 20 mg via INTRAVENOUS

## 2020-10-30 MED ORDER — MIDAZOLAM HCL 2 MG/2ML IJ SOLN
INTRAMUSCULAR | Status: AC
  Administered 2020-10-30: 2 mg via INTRAVENOUS
  Filled 2020-10-30: qty 2

## 2020-10-30 MED ORDER — TRAZODONE HCL 100 MG PO TABS
100.0000 mg | ORAL_TABLET | Freq: Every day | ORAL | Status: DC
Start: 2020-10-30 — End: 2020-10-31
  Administered 2020-10-30: 100 mg via ORAL
  Filled 2020-10-30: qty 1

## 2020-10-30 MED ORDER — ACETAMINOPHEN 500 MG PO TABS
1000.0000 mg | ORAL_TABLET | Freq: Four times a day (QID) | ORAL | Status: DC
  Administered 2020-10-30 – 2020-10-31 (×3): 1000 mg via ORAL
  Filled 2020-10-30 (×4): qty 2

## 2020-10-30 MED ORDER — FLEET ENEMA 7-19 GM/118ML RE ENEM: 1.0000 | ENEMA | Freq: Once | RECTAL | Status: DC | PRN

## 2020-10-30 MED ORDER — PROPOFOL 500 MG/50ML IV EMUL
INTRAVENOUS | Status: DC | PRN
  Administered 2020-10-30: 75 ug/kg/min via INTRAVENOUS

## 2020-10-30 MED ORDER — ONDANSETRON HCL 4 MG/2ML IJ SOLN: 4.0000 mg | Freq: Four times a day (QID) | INTRAMUSCULAR | Status: DC | PRN

## 2020-10-30 MED ORDER — BUPIVACAINE LIPOSOME 1.3 % IJ SUSP
INTRAMUSCULAR | Status: DC | PRN
  Administered 2020-10-30: 20 mL

## 2020-10-30 MED ORDER — PHENOL 1.4 % MT LIQD: 1.0000 | OROMUCOSAL | Status: DC | PRN

## 2020-10-30 MED ORDER — OXYCODONE HCL 5 MG PO TABS
5.0000 mg | ORAL_TABLET | ORAL | Status: DC | PRN
  Administered 2020-10-30: 10 mg via ORAL
  Filled 2020-10-30: qty 2
  Filled 2020-10-30: qty 1
  Filled 2020-10-30: qty 2

## 2020-10-30 MED ORDER — MORPHINE SULFATE (PF) 2 MG/ML IV SOLN
0.5000 mg | INTRAVENOUS | Status: DC | PRN
  Administered 2020-10-30: 1 mg via INTRAVENOUS
  Filled 2020-10-30: qty 1

## 2020-10-30 MED ORDER — TRANEXAMIC ACID-NACL 1000-0.7 MG/100ML-% IV SOLN
1000.0000 mg | INTRAVENOUS | Status: AC
  Administered 2020-10-30: 1000 mg via INTRAVENOUS

## 2020-10-30 MED ORDER — FENTANYL CITRATE (PF) 100 MCG/2ML IJ SOLN
INTRAMUSCULAR | Status: AC
  Administered 2020-10-30: 100 ug via INTRAVENOUS
  Filled 2020-10-30: qty 2

## 2020-10-30 MED ORDER — PROPOFOL 1000 MG/100ML IV EMUL
INTRAVENOUS | Status: AC
  Filled 2020-10-30: qty 200

## 2020-10-30 MED ORDER — POVIDONE-IODINE 10 % EX SWAB
2.0000 "application " | Freq: Once | CUTANEOUS | Status: AC
  Administered 2020-10-30: 2 via TOPICAL

## 2020-10-30 MED ORDER — GLIPIZIDE 10 MG PO TABS
10.0000 mg | ORAL_TABLET | Freq: Two times a day (BID) | ORAL | Status: DC
  Administered 2020-10-31: 10 mg via ORAL
  Filled 2020-10-30: qty 1

## 2020-10-30 MED ORDER — TRANEXAMIC ACID-NACL 1000-0.7 MG/100ML-% IV SOLN
INTRAVENOUS | Status: AC
  Filled 2020-10-30: qty 100

## 2020-10-30 MED ORDER — CHLORHEXIDINE GLUCONATE 0.12 % MT SOLN
15.0000 mL | Freq: Once | OROMUCOSAL | Status: AC
  Administered 2020-10-30: 15 mL via OROMUCOSAL

## 2020-10-30 MED ORDER — FLUOXETINE HCL 20 MG PO CAPS
60.0000 mg | ORAL_CAPSULE | Freq: Every morning | ORAL | Status: DC
  Administered 2020-10-31: 60 mg via ORAL
  Filled 2020-10-30: qty 3

## 2020-10-30 MED ORDER — TAMSULOSIN HCL 0.4 MG PO CAPS
0.4000 mg | ORAL_CAPSULE | Freq: Every day | ORAL | Status: DC
  Administered 2020-10-31: 0.4 mg via ORAL
  Filled 2020-10-30: qty 1

## 2020-10-30 MED ORDER — PRAMIPEXOLE DIHYDROCHLORIDE 0.25 MG PO TABS
1.5000 mg | ORAL_TABLET | Freq: Every day | ORAL | Status: DC
  Administered 2020-10-30: 1.5 mg via ORAL
  Filled 2020-10-30: qty 6

## 2020-10-30 MED ORDER — FENTANYL CITRATE (PF) 100 MCG/2ML IJ SOLN
INTRAMUSCULAR | Status: DC | PRN
  Administered 2020-10-30: 25 ug via INTRAVENOUS

## 2020-10-30 MED ORDER — ONDANSETRON HCL 4 MG PO TABS: 4.0000 mg | ORAL_TABLET | Freq: Four times a day (QID) | ORAL | Status: DC | PRN

## 2020-10-30 MED ORDER — ONDANSETRON HCL 4 MG/2ML IJ SOLN
INTRAMUSCULAR | Status: DC | PRN
  Administered 2020-10-30: 4 mg via INTRAVENOUS

## 2020-10-30 MED ORDER — ATORVASTATIN CALCIUM 20 MG PO TABS
20.0000 mg | ORAL_TABLET | Freq: Every evening | ORAL | Status: DC
  Administered 2020-10-30: 20 mg via ORAL
  Filled 2020-10-30: qty 1

## 2020-10-30 MED ORDER — ONDANSETRON HCL 4 MG/2ML IJ SOLN
INTRAMUSCULAR | Status: AC
  Administered 2020-10-30: 4 mg via INTRAVENOUS
  Filled 2020-10-30: qty 2

## 2020-10-30 MED ORDER — METOCLOPRAMIDE HCL 5 MG PO TABS: 5.0000 mg | ORAL_TABLET | Freq: Three times a day (TID) | ORAL | Status: DC | PRN

## 2020-10-30 MED ORDER — AMLODIPINE BESYLATE 10 MG PO TABS
10.0000 mg | ORAL_TABLET | Freq: Every day | ORAL | Status: DC
  Administered 2020-10-31: 10 mg via ORAL
  Filled 2020-10-30: qty 1

## 2020-10-30 MED ORDER — MIDAZOLAM HCL 2 MG/2ML IJ SOLN: 1.0000 mg | INTRAMUSCULAR | Status: DC

## 2020-10-30 MED ORDER — SODIUM CHLORIDE (PF) 0.9 % IJ SOLN
INTRAMUSCULAR | Status: DC | PRN
  Administered 2020-10-30: 60 mL

## 2020-10-30 MED ORDER — OXYCODONE HCL 5 MG PO TABS
10.0000 mg | ORAL_TABLET | ORAL | Status: DC | PRN
Start: 2020-10-30 — End: 2020-10-31
  Administered 2020-10-30 – 2020-10-31 (×5): 15 mg via ORAL
  Filled 2020-10-30 (×4): qty 3

## 2020-10-30 MED ORDER — PHENYLEPHRINE 40 MCG/ML (10ML) SYRINGE FOR IV PUSH (FOR BLOOD PRESSURE SUPPORT)
PREFILLED_SYRINGE | INTRAVENOUS | Status: AC
  Filled 2020-10-30: qty 10

## 2020-10-30 MED ORDER — OXYCODONE HCL 5 MG PO TABS: 5.0000 mg | ORAL_TABLET | Freq: Once | ORAL | Status: DC | PRN

## 2020-10-30 MED ORDER — ONDANSETRON HCL 4 MG/2ML IJ SOLN
INTRAMUSCULAR | Status: AC
  Filled 2020-10-30: qty 2

## 2020-10-30 MED ORDER — ROPIVACAINE HCL 5 MG/ML IJ SOLN
INTRAMUSCULAR | Status: DC | PRN
  Administered 2020-10-30: 30 mL via PERINEURAL

## 2020-10-30 MED ORDER — ORAL CARE MOUTH RINSE: 15.0000 mL | Freq: Once | OROMUCOSAL | Status: AC

## 2020-10-30 MED ORDER — FUROSEMIDE 20 MG PO TABS
20.0000 mg | ORAL_TABLET | Freq: Every day | ORAL | Status: DC
  Administered 2020-10-31: 20 mg via ORAL
  Filled 2020-10-30: qty 1

## 2020-10-30 MED ORDER — CYCLOBENZAPRINE HCL 10 MG PO TABS
10.0000 mg | ORAL_TABLET | Freq: Three times a day (TID) | ORAL | Status: DC | PRN
  Administered 2020-10-30 – 2020-10-31 (×2): 10 mg via ORAL
  Filled 2020-10-30 (×3): qty 1

## 2020-10-30 MED ORDER — DEXMEDETOMIDINE HCL 200 MCG/2ML IV SOLN
INTRAVENOUS | Status: DC | PRN
  Administered 2020-10-30: 8 ug via INTRAVENOUS

## 2020-10-30 MED ORDER — POLYETHYLENE GLYCOL 3350 17 G PO PACK: 17.0000 g | PACK | Freq: Every day | ORAL | Status: DC | PRN

## 2020-10-30 MED ORDER — LOSARTAN POTASSIUM 50 MG PO TABS
50.0000 mg | ORAL_TABLET | Freq: Two times a day (BID) | ORAL | Status: DC
  Administered 2020-10-31: 50 mg via ORAL
  Filled 2020-10-30: qty 1

## 2020-10-30 MED ORDER — DEXMEDETOMIDINE (PRECEDEX) IN NS 20 MCG/5ML (4 MCG/ML) IV SYRINGE
PREFILLED_SYRINGE | INTRAVENOUS | Status: AC
  Filled 2020-10-30: qty 5

## 2020-10-30 MED ORDER — DEXAMETHASONE SODIUM PHOSPHATE 10 MG/ML IJ SOLN
INTRAMUSCULAR | Status: DC | PRN
  Administered 2020-10-30: 10 mg

## 2020-10-30 MED ORDER — DEXAMETHASONE SODIUM PHOSPHATE 10 MG/ML IJ SOLN
INTRAMUSCULAR | Status: AC
  Filled 2020-10-30: qty 1

## 2020-10-30 MED ORDER — PHENYLEPHRINE 40 MCG/ML (10ML) SYRINGE FOR IV PUSH (FOR BLOOD PRESSURE SUPPORT)
PREFILLED_SYRINGE | INTRAVENOUS | Status: DC | PRN
  Administered 2020-10-30 (×2): 80 ug via INTRAVENOUS
  Administered 2020-10-30: 120 ug via INTRAVENOUS

## 2020-10-30 MED ORDER — ASPIRIN EC 325 MG PO TBEC
325.0000 mg | DELAYED_RELEASE_TABLET | Freq: Two times a day (BID) | ORAL | Status: DC
  Administered 2020-10-31: 325 mg via ORAL
  Filled 2020-10-30: qty 1

## 2020-10-30 MED ORDER — ONDANSETRON HCL 4 MG/2ML IJ SOLN: 4.0000 mg | Freq: Once | INTRAMUSCULAR | Status: AC | PRN

## 2020-10-30 MED ORDER — METOCLOPRAMIDE HCL 5 MG/ML IJ SOLN
5.0000 mg | Freq: Three times a day (TID) | INTRAMUSCULAR | Status: DC | PRN
Start: 2020-10-30 — End: 2020-10-31

## 2020-10-30 MED ORDER — PANTOPRAZOLE SODIUM 40 MG PO TBEC
40.0000 mg | DELAYED_RELEASE_TABLET | Freq: Every day | ORAL | Status: DC
  Administered 2020-10-31: 40 mg via ORAL
  Filled 2020-10-30: qty 1

## 2020-10-30 MED ORDER — DEXAMETHASONE SODIUM PHOSPHATE 10 MG/ML IJ SOLN: 8.0000 mg | Freq: Once | INTRAMUSCULAR | Status: AC

## 2020-10-30 MED ORDER — SODIUM CHLORIDE 0.9 % IR SOLN
Status: DC | PRN
  Administered 2020-10-30 (×2): 1000 mL

## 2020-10-30 MED ORDER — CEFAZOLIN SODIUM-DEXTROSE 2-4 GM/100ML-% IV SOLN
2.0000 g | INTRAVENOUS | Status: AC
  Administered 2020-10-30: 2 g via INTRAVENOUS

## 2020-10-30 MED ORDER — PROPOFOL 10 MG/ML IV BOLUS
INTRAVENOUS | Status: AC
  Filled 2020-10-30: qty 40

## 2020-10-30 MED ORDER — DOCUSATE SODIUM 100 MG PO CAPS
100.0000 mg | ORAL_CAPSULE | Freq: Two times a day (BID) | ORAL | Status: DC
  Administered 2020-10-30 – 2020-10-31 (×2): 100 mg via ORAL
  Filled 2020-10-30 (×2): qty 1

## 2020-10-30 MED ORDER — BISACODYL 10 MG RE SUPP: 10.0000 mg | Freq: Every day | RECTAL | Status: DC | PRN

## 2020-10-30 MED ORDER — OXYCODONE HCL 5 MG/5ML PO SOLN: 5.0000 mg | Freq: Once | ORAL | Status: DC | PRN

## 2020-10-30 MED ORDER — HYDROXYZINE HCL 25 MG PO TABS
25.0000 mg | ORAL_TABLET | Freq: Three times a day (TID) | ORAL | Status: DC | PRN
Start: 2020-10-30 — End: 2020-10-31

## 2020-10-30 MED ORDER — DIPHENHYDRAMINE HCL 12.5 MG/5ML PO ELIX
12.5000 mg | ORAL_SOLUTION | ORAL | Status: DC | PRN
  Administered 2020-10-30: 25 mg via ORAL
  Filled 2020-10-30: qty 10

## 2020-10-30 MED ORDER — ACETAMINOPHEN 10 MG/ML IV SOLN
1000.0000 mg | Freq: Four times a day (QID) | INTRAVENOUS | Status: DC
  Administered 2020-10-30: 1000 mg via INTRAVENOUS

## 2020-10-30 MED ORDER — NITROGLYCERIN 0.4 MG SL SUBL: 0.4000 mg | SUBLINGUAL_TABLET | SUBLINGUAL | Status: DC | PRN

## 2020-10-30 MED ORDER — FENTANYL CITRATE (PF) 100 MCG/2ML IJ SOLN
INTRAMUSCULAR | Status: AC
  Filled 2020-10-30: qty 2

## 2020-10-30 MED ORDER — FENTANYL CITRATE (PF) 100 MCG/2ML IJ SOLN: 50.0000 ug | INTRAMUSCULAR | Status: DC

## 2020-10-30 MED ORDER — INSULIN ASPART 100 UNIT/ML ~~LOC~~ SOLN
0.0000 [IU] | Freq: Every day | SUBCUTANEOUS | Status: DC
  Administered 2020-10-30: 3 [IU] via SUBCUTANEOUS

## 2020-10-30 MED ORDER — CARBAMAZEPINE 200 MG PO TABS
200.0000 mg | ORAL_TABLET | Freq: Two times a day (BID) | ORAL | Status: DC
  Administered 2020-10-30 – 2020-10-31 (×2): 200 mg via ORAL
  Filled 2020-10-30 (×2): qty 1

## 2020-10-30 MED ORDER — SODIUM CHLORIDE 0.9 % IV SOLN: INTRAVENOUS | Status: DC

## 2020-10-30 MED ORDER — CEFAZOLIN SODIUM-DEXTROSE 2-4 GM/100ML-% IV SOLN
INTRAVENOUS | Status: AC
  Filled 2020-10-30: qty 100

## 2020-10-30 MED ORDER — MENTHOL 3 MG MT LOZG: 1.0000 | LOZENGE | OROMUCOSAL | Status: DC | PRN

## 2020-10-30 MED ORDER — ACETAMINOPHEN 10 MG/ML IV SOLN
INTRAVENOUS | Status: AC
  Filled 2020-10-30: qty 100

## 2020-10-30 MED ORDER — CEFAZOLIN SODIUM-DEXTROSE 2-4 GM/100ML-% IV SOLN
2.0000 g | Freq: Four times a day (QID) | INTRAVENOUS | Status: AC
  Administered 2020-10-30 (×2): 2 g via INTRAVENOUS
  Filled 2020-10-30 (×2): qty 100

## 2020-10-30 SURGICAL SUPPLY — 54 items
ATTUNE PS FEM RT SZ 5 CEM KNEE (Femur) ×1 IMPLANT
ATTUNE PSRP INSR SZ5 8 KNEE (Insert) ×1 IMPLANT
BAG SPEC THK2 15X12 ZIP CLS (MISCELLANEOUS) ×1
BAG ZIPLOCK 12X15 (MISCELLANEOUS) ×2 IMPLANT
BASE TIBIAL ROT PLAT SZ 5 KNEE (Knees) IMPLANT
BLADE SAG 18X100X1.27 (BLADE) ×2 IMPLANT
BLADE SAW SGTL 11.0X1.19X90.0M (BLADE) ×2 IMPLANT
BNDG ELASTIC 6X5.8 VLCR STR LF (GAUZE/BANDAGES/DRESSINGS) ×2 IMPLANT
BOWL SMART MIX CTS (DISPOSABLE) ×2 IMPLANT
BSPLAT TIB 5 CMNT ROT PLAT STR (Knees) ×1 IMPLANT
CEMENT HV SMART SET (Cement) ×4 IMPLANT
COVER SURGICAL LIGHT HANDLE (MISCELLANEOUS) ×2 IMPLANT
COVER WAND RF STERILE (DRAPES) IMPLANT
CUFF TOURN SGL QUICK 34 (TOURNIQUET CUFF) ×2
CUFF TRNQT CYL 34X4.125X (TOURNIQUET CUFF) ×1 IMPLANT
DECANTER SPIKE VIAL GLASS SM (MISCELLANEOUS) ×2 IMPLANT
DRAPE U-SHAPE 47X51 STRL (DRAPES) ×2 IMPLANT
DRSG AQUACEL AG ADV 3.5X10 (GAUZE/BANDAGES/DRESSINGS) ×2 IMPLANT
DURAPREP 26ML APPLICATOR (WOUND CARE) ×2 IMPLANT
ELECT REM PT RETURN 15FT ADLT (MISCELLANEOUS) ×2 IMPLANT
GLOVE SRG 8 PF TXTR STRL LF DI (GLOVE) ×1 IMPLANT
GLOVE SURG ENC MOIS LTX SZ6 (GLOVE) IMPLANT
GLOVE SURG ENC MOIS LTX SZ7 (GLOVE) ×2 IMPLANT
GLOVE SURG ENC MOIS LTX SZ8 (GLOVE) ×2 IMPLANT
GLOVE SURG UNDER POLY LF SZ6.5 (GLOVE) ×2 IMPLANT
GLOVE SURG UNDER POLY LF SZ8 (GLOVE) ×2
GLOVE SURG UNDER POLY LF SZ8.5 (GLOVE) ×2 IMPLANT
GOWN STRL REUS W/TWL LRG LVL3 (GOWN DISPOSABLE) ×4 IMPLANT
GOWN STRL REUS W/TWL XL LVL3 (GOWN DISPOSABLE) ×2 IMPLANT
HANDPIECE INTERPULSE COAX TIP (DISPOSABLE) ×2
HOLDER FOLEY CATH W/STRAP (MISCELLANEOUS) ×1 IMPLANT
IMMOBILIZER KNEE 20 (SOFTGOODS) ×2
IMMOBILIZER KNEE 20 THIGH 36 (SOFTGOODS) ×1 IMPLANT
KIT TURNOVER KIT A (KITS) ×2 IMPLANT
MANIFOLD NEPTUNE II (INSTRUMENTS) ×2 IMPLANT
NS IRRIG 1000ML POUR BTL (IV SOLUTION) ×2 IMPLANT
PACK TOTAL KNEE CUSTOM (KITS) ×2 IMPLANT
PADDING CAST COTTON 6X4 STRL (CAST SUPPLIES) ×3 IMPLANT
PATELLA MEDIAL ATTUN 35MM KNEE (Knees) ×1 IMPLANT
PENCIL SMOKE EVACUATOR (MISCELLANEOUS) ×2 IMPLANT
PIN DRILL FIX HALF THREAD (BIT) ×1 IMPLANT
PIN STEINMAN FIXATION KNEE (PIN) ×1 IMPLANT
PROTECTOR NERVE ULNAR (MISCELLANEOUS) ×2 IMPLANT
SET HNDPC FAN SPRY TIP SCT (DISPOSABLE) ×1 IMPLANT
STRIP CLOSURE SKIN 1/2X4 (GAUZE/BANDAGES/DRESSINGS) ×4 IMPLANT
SUT MNCRL AB 4-0 PS2 18 (SUTURE) ×2 IMPLANT
SUT STRATAFIX 0 PDS 27 VIOLET (SUTURE) ×2
SUT VIC AB 2-0 CT1 27 (SUTURE) ×6
SUT VIC AB 2-0 CT1 TAPERPNT 27 (SUTURE) ×3 IMPLANT
SUTURE STRATFX 0 PDS 27 VIOLET (SUTURE) ×1 IMPLANT
TIBIAL BASE ROT PLAT SZ 5 KNEE (Knees) ×2 IMPLANT
TRAY FOLEY MTR SLVR 16FR STAT (SET/KITS/TRAYS/PACK) ×2 IMPLANT
WATER STERILE IRR 1000ML POUR (IV SOLUTION) ×4 IMPLANT
WRAP KNEE MAXI GEL POST OP (GAUZE/BANDAGES/DRESSINGS) ×2 IMPLANT

## 2020-10-30 NOTE — Anesthesia Procedure Notes (Signed)
Anesthesia Regional Block: Adductor canal block   Pre-Anesthetic Checklist: ,, timeout performed, Correct Patient, Correct Site, Correct Laterality, Correct Procedure, Correct Position, site marked, Risks and benefits discussed,  Surgical consent,  Pre-op evaluation,  At surgeon's request and post-op pain management  Laterality: Right  Prep: Maximum Sterile Barrier Precautions used, chloraprep       Needles:  Injection technique: Single-shot  Needle Type: Echogenic Stimulator Needle     Needle Length: 9cm  Needle Gauge: 22     Additional Needles:   Procedures:,,,, ultrasound used (permanent image in chart),,,,  Narrative:  Start time: 10/30/2020 10:25 AM End time: 10/30/2020 10:30 AM Injection made incrementally with aspirations every 5 mL.  Performed by: Personally  Anesthesiologist: Pervis Hocking, DO  Additional Notes: Monitors applied. No increased pain on injection. No increased resistance to injection. Injection made in 5cc increments. Good needle visualization. Patient tolerated procedure well.

## 2020-10-30 NOTE — Progress Notes (Signed)
AssistedDr. Beth Finucane with right, ultrasound guided, adductor canal block. Side rails up, monitors on throughout procedure. See vital signs in flow sheet. Tolerated Procedure well.  

## 2020-10-30 NOTE — Anesthesia Procedure Notes (Signed)
Procedure Name: MAC Date/Time: 10/30/2020 11:14 AM Performed by: West Pugh, CRNA Pre-anesthesia Checklist: Patient identified, Emergency Drugs available, Suction available, Patient being monitored and Timeout performed Patient Re-evaluated:Patient Re-evaluated prior to induction Oxygen Delivery Method: Simple face mask Induction Type: IV induction Placement Confirmation: positive ETCO2 Dental Injury: Teeth and Oropharynx as per pre-operative assessment

## 2020-10-30 NOTE — Interval H&P Note (Signed)
History and Physical Interval Note:  10/30/2020 9:21 AM  Travis [name]  has presented today for surgery, with the diagnosis of right knee osteoarthritis.  The various methods of treatment have been discussed with the patient and family. After consideration of risks, benefits and other options for treatment, the patient has consented to  Procedure(s) with comments: TOTAL KNEE ARTHROPLASTY (Right) - 28min as a surgical intervention.  The patient's history has been reviewed, patient examined, no change in status, stable for surgery.  I have reviewed the patient's chart and labs.  Questions were answered to the patient's satisfaction.     Pilar Plate [name]

## 2020-10-30 NOTE — Discharge Instructions (Signed)
 Frank Aluisio, MD Total Joint Specialist [facility] [address] [place], [place] [address] [phone]  TOTAL KNEE REPLACEMENT POSTOPERATIVE DIRECTIONS    Knee Rehabilitation, Guidelines Following Surgery  Results after knee surgery are often greatly improved when you follow the exercise, range of motion and muscle strengthening exercises prescribed by your doctor. Safety measures are also important to protect the knee from further injury. If any of these exercises cause you to have increased pain or swelling in your knee joint, decrease the amount until you are comfortable again and slowly increase them. If you have problems or questions, call your caregiver or physical therapist for advice.   BLOOD CLOT PREVENTION . Take a 325 mg Aspirin two times a day for three weeks following surgery. Then resume one 81 mg Aspirin once a day. . You may resume your vitamins/supplements upon discharge from the hospital. . Do not take any NSAIDs (Advil, Aleve, Ibuprofen, Meloxicam, etc.) until you have discontinued the 325 mg Aspirin.  HOME CARE INSTRUCTIONS  . Remove items at home which could result in a fall. This includes throw rugs or furniture in walking pathways.  . ICE to the affected knee as much as tolerated. Icing helps control swelling. If the swelling is well controlled you will be more comfortable and rehab easier. Continue to use ice on the knee for pain and swelling from surgery. You may notice swelling that will progress down to the foot and ankle. This is normal after surgery. Elevate the leg when you are not up walking on it.    . Continue to use the breathing machine which will help keep your temperature down. It is common for your temperature to cycle up and down following surgery, especially at night when you are not up moving around and exerting yourself. The breathing machine keeps your lungs expanded and your temperature down. . Do not place pillow  under the operative knee, focus on keeping the knee straight while resting  DIET You may resume your previous home diet once you are discharged from the hospital.  DRESSING / WOUND CARE / SHOWERING . Keep your bulky bandage on for 2 days. On the third post-operative day you may remove the Ace bandage and gauze. There is a waterproof adhesive bandage on your skin which will stay in place until your first follow-up appointment. Once you remove this you will not need to place another bandage . You may begin showering 3 days following surgery, but do not submerge the incision under water.  ACTIVITY For the first 5 days, the key is rest and control of pain and swelling . Do your home exercises twice a day starting on post-operative day 3. On the days you go to physical therapy, just do the home exercises once that day. . You should rest, ice and elevate the leg for 50 minutes out of every hour. Get up and walk/stretch for 10 minutes per hour. After 5 days you can increase your activity slowly as tolerated. . Walk with your walker as instructed. Use the walker until you are comfortable transitioning to a cane. Walk with the cane in the opposite hand of the operative leg. You may discontinue the cane once you are comfortable and walking steadily. . Avoid periods of inactivity such as sitting longer than an hour when not asleep. This helps prevent blood clots.  . You may discontinue the knee immobilizer once you are able to perform a straight leg raise while lying down. .   You may resume a sexual relationship in one month or when given the OK by your doctor.  . You may return to work once you are cleared by your doctor.  . Do not drive a car for 6 weeks or until released by your surgeon.  . Do not drive while taking narcotics.  TED HOSE STOCKINGS Wear the elastic stockings on both legs for three weeks following surgery during the day. You may remove them at night for sleeping.  WEIGHT BEARING Weight  bearing as tolerated with assist device (walker, cane, etc) as directed, use it as long as suggested by your surgeon or therapist, typically at least 4-6 weeks.  POSTOPERATIVE CONSTIPATION PROTOCOL Constipation - defined medically as fewer than three stools per week and severe constipation as less than one stool per week.  One of the most common issues patients have following surgery is constipation.  Even if you have a regular bowel pattern at home, your normal regimen is likely to be disrupted due to multiple reasons following surgery.  Combination of anesthesia, postoperative narcotics, change in appetite and fluid intake all can affect your bowels.  In order to avoid complications following surgery, here are some recommendations in order to help you during your recovery period.  . Colace (docusate) - Pick up an over-the-counter form of Colace or another stool softener and take twice a day as long as you are requiring postoperative pain medications.  Take with a full glass of water daily.  If you experience loose stools or diarrhea, hold the colace until you stool forms back up. If your symptoms do not get better within 1 week or if they get worse, check with your doctor. . Dulcolax (bisacodyl) - Pick up over-the-counter and take as directed by the product packaging as needed to assist with the movement of your bowels.  Take with a full glass of water.  Use this product as needed if not relieved by Colace only.  . MiraLax (polyethylene glycol) - Pick up over-the-counter to have on hand. MiraLax is a solution that will increase the amount of water in your bowels to assist with bowel movements.  Take as directed and can mix with a glass of water, juice, soda, coffee, or tea. Take if you go more than two days without a movement. Do not use MiraLax more than once per day. Call your doctor if you are still constipated or irregular after using this medication for 7 days in a row.  If you continue to have  problems with postoperative constipation, please contact the office for further assistance and recommendations.  If you experience "the worst abdominal pain ever" or develop nausea or vomiting, please contact the office immediatly for further recommendations for treatment.  ITCHING If you experience itching with your medications, try taking only a single pain pill, or even half a pain pill at a time.  You can also use Benadryl over the counter for itching or also to help with sleep.   MEDICATIONS See your medication summary on the "After Visit Summary" that the nursing staff will review with you prior to discharge.  You may have some home medications which will be placed on hold until you complete the course of blood thinner medication.  It is important for you to complete the blood thinner medication as prescribed by your surgeon.  Continue your approved medications as instructed at time of discharge.  PRECAUTIONS . If you experience chest pain or shortness of breath - call 911 immediately for   transfer to the hospital emergency department.  . If you develop a fever greater that 101 F, purulent drainage from wound, increased redness or drainage from wound, foul odor from the wound/dressing, or calf pain - CONTACT YOUR SURGEON.                                                   FOLLOW-UP APPOINTMENTS Make sure you keep all of your appointments after your operation with your surgeon and caregivers. You should call the office at the above phone number and make an appointment for approximately two weeks after the date of your surgery or on the date instructed by your surgeon outlined in the "After Visit Summary".  RANGE OF MOTION AND STRENGTHENING EXERCISES  Rehabilitation of the knee is important following a knee injury or an operation. After just a few days of immobilization, the muscles of the thigh which control the knee become weakened and shrink (atrophy). Knee exercises are designed to build up the  tone and strength of the thigh muscles and to improve knee motion. Often times heat used for twenty to thirty minutes before working out will loosen up your tissues and help with improving the range of motion but do not use heat for the first two weeks following surgery. These exercises can be done on a training (exercise) mat, on the floor, on a table or on a bed. Use what ever works the best and is most comfortable for you Knee exercises include:  . Leg Lifts - While your knee is still immobilized in a splint or cast, you can do straight leg raises. Lift the leg to 60 degrees, hold for 3 sec, and slowly lower the leg. Repeat 10-20 times 2-3 times daily. Perform this exercise against resistance later as your knee gets better.  . Quad and Hamstring Sets - Tighten up the muscle on the front of the thigh (Quad) and hold for 5-10 sec. Repeat this 10-20 times hourly. Hamstring sets are done by pushing the foot backward against an object and holding for 5-10 sec. Repeat as with quad sets.   Leg Slides: Lying on your back, slowly slide your foot toward your buttocks, bending your knee up off the floor (only go as far as is comfortable). Then slowly slide your foot back down until your leg is flat on the floor again.  Angel Wings: Lying on your back spread your legs to the side as far apart as you can without causing discomfort.  A rehabilitation program following serious knee injuries can speed recovery and prevent re-injury in the future due to weakened muscles. Contact your doctor or a physical therapist for more information on knee rehabilitation.   IF YOU ARE TRANSFERRED TO A SKILLED REHAB FACILITY If the patient is transferred to a skilled rehab facility following release from the hospital, a list of the current medications will be sent to the facility for the patient to continue.  When discharged from the skilled rehab facility, please have the facility set up the patient's Home Health Physical Therapy  prior to being released. Also, the skilled facility will be responsible for providing the patient with their medications at time of release from the facility to include their pain medication, the muscle relaxants, and their blood thinner medication. If the patient is still at the rehab facility at time of the   two week follow up appointment, the skilled rehab facility will also need to assist the patient in arranging follow up appointment in our office and any transportation needs.  MAKE SURE YOU:  . Understand these instructions.  . Get help right away if you are not doing well or get worse.   DENTAL ANTIBIOTICS:  In most cases prophylactic antibiotics for Dental procdeures after total joint surgery are not necessary.  Exceptions are as follows:  1. History of prior total joint infection  2. Severely immunocompromised (Organ Transplant, cancer chemotherapy, Rheumatoid biologic meds such as Humera)  3. Poorly controlled diabetes (A1C &gt; 8.0, blood glucose over 200)  If you have one of these conditions, contact your surgeon for an antibiotic prescription, prior to your dental procedure.    Pick up stool softner and laxative for home use following surgery while on pain medications. Do not submerge incision under water. Please use good hand washing techniques while changing dressing each day. May shower starting three days after surgery. Please use a clean towel to pat the incision dry following showers. Continue to use ice for pain and swelling after surgery. Do not use any lotions or creams on the incision until instructed by your surgeon.  

## 2020-10-30 NOTE — Anesthesia Preprocedure Evaluation (Addendum)
Anesthesia Evaluation  Patient identified by MRN, date of birth, ID band Patient awake    Reviewed: Allergy & Precautions, NPO status , Patient's Chart, lab work & pertinent test results  Airway Mallampati: II  TM Distance: >3 FB Neck ROM: Full    Dental no notable dental hx. (+) Teeth Intact, Missing, Dental Advisory Given,    Pulmonary neg pulmonary ROS,    Pulmonary exam normal breath sounds clear to auscultation       Cardiovascular hypertension, Pt. on medications Normal cardiovascular exam Rhythm:Regular Rate:Normal  Echo 2017: - Left ventricle: The cavity size was normal. Systolic function was  normal. The estimated ejection fraction was in the range of 60%  to 65%. Wall motion was normal; there were no regional wall  motion abnormalities. Left ventricular diastolic function  parameters were normal.    Stress test 2015 nml   Neuro/Psych PSYCHIATRIC DISORDERS Depression Schizophrenia negative neurological ROS     GI/Hepatic Neg liver ROS, GERD  Medicated and Controlled,  Endo/Other  diabetes, Well Controlled, Type 2, Oral Hypoglycemic AgentsObesity BMI 32 a1c 7.6  Renal/GU negative Renal ROS  negative genitourinary   Musculoskeletal  (+) Arthritis , Osteoarthritis,  R knee OA    Abdominal (+) + obese,   Peds  Hematology negative hematology ROS (+) hct 45.4, plt 187   Anesthesia Other Findings   Reproductive/Obstetrics negative OB ROS                            Anesthesia Physical Anesthesia Plan  ASA: II  Anesthesia Plan: Spinal, Regional and MAC   Post-op Pain Management:  Regional for Post-op pain   Induction:   PONV Risk Score and Plan: 2 and Propofol infusion and TIVA  Airway Management Planned: Natural Airway and Nasal Cannula  Additional Equipment: None  Intra-op Plan:   Post-operative Plan:   Informed Consent: I have reviewed the patients History  and Physical, chart, labs and discussed the procedure including the risks, benefits and alternatives for the proposed anesthesia with the patient or authorized representative who has indicated his/her understanding and acceptance.       Plan Discussed with: CRNA  Anesthesia Plan Comments:        Anesthesia Quick Evaluation

## 2020-10-30 NOTE — Anesthesia Postprocedure Evaluation (Signed)
Anesthesia Post Note  Patient: Bearl R Martinique  Procedure(s) Performed: TOTAL KNEE ARTHROPLASTY (Right Knee)     Patient location during evaluation: PACU Anesthesia Type: Regional, MAC and Spinal Level of consciousness: awake and alert and oriented Pain management: pain level controlled Vital Signs Assessment: post-procedure vital signs reviewed and stable Respiratory status: spontaneous breathing, nonlabored ventilation and respiratory function stable Cardiovascular status: blood pressure returned to baseline and stable Postop Assessment: no headache, no backache, spinal receding and no apparent nausea or vomiting Anesthetic complications: no   No complications documented.  Last Vitals:  Vitals:   10/30/20 1300 10/30/20 1315  BP: 112/75 115/75  Pulse: 84 82  Resp: 16 20  Temp:    SpO2: 96% 96%    Last Pain:  Vitals:   10/30/20 1300  TempSrc:   PainSc: 0-No pain                 Pervis Hocking

## 2020-10-30 NOTE — Anesthesia Procedure Notes (Addendum)
Spinal  Patient location during procedure: OR Start time: 10/30/2020 11:17 AM End time: 10/30/2020 11:19 AM Staffing Performed: resident/CRNA  Anesthesiologist: Pervis Hocking, DO Resident/CRNA: West Pugh, CRNA Preanesthetic Checklist Completed: patient identified, IV checked, site marked, risks and benefits discussed, surgical consent, monitors and equipment checked, pre-op evaluation and timeout performed Spinal Block Patient position: sitting Prep: DuraPrep and site prepped and draped Patient monitoring: heart rate, cardiac monitor, continuous pulse ox and blood pressure Approach: midline Location: L3-4 Injection technique: single-shot Needle Needle type: Pencan and Introducer  Needle gauge: 24 G Needle length: 10 cm Assessment Sensory level: T4 Additional Notes IV functioning, monitors applied to pt. Expiration date of kit checked and confirmed to be in date. Sterile prep and drape, hand hygiene and sterile gloved used. Pt was positioned and spine was prepped in sterile fashion. Skin was anesthetized with lidocaine. Free flow of clear CSF obtained prior to injecting local anesthetic into CSF x 1 attempt. Spinal needle aspirated freely following injection. Needle was carefully withdrawn, and pt tolerated procedure well. Loss of motor and sensory on exam post injection. Dr Doroteo Glassman present for procedure.

## 2020-10-30 NOTE — Op Note (Signed)
OPERATIVE REPORT-TOTAL KNEE ARTHROPLASTY   Pre-operative diagnosis- Osteoarthritis  Right knee(s)  Post-operative diagnosis- Osteoarthritis Right knee(s)  Procedure-  Right  Total Knee Arthroplasty  Surgeon- Dione Plover. Annitta Fifield, MD  Assistant- Theresa Duty, PA-C   Anesthesia-  Adductor canal block and spinal  EBL- 25 ml   Drains None  Tourniquet time-  Total Tourniquet Time Documented: Thigh (Right) - 35 minutes Total: Thigh (Right) - 35 minutes     Complications- None  Condition-PACU - hemodynamically stable.   Brief Clinical Note  Travis [name] is a 60 y.o. year old male with end stage OA of his right knee with progressively worsening pain and dysfunction. He has constant pain, with activity and at rest and significant functional deficits with difficulties even with ADLs. He has had extensive non-op management including analgesics, injections of cortisone and viscosupplements, and home exercise program, but remains in significant pain with significant dysfunction. Radiographs show bone on bone arthritis medial and patellofemoral. He presents now for right Total Knee Arthroplasty.    Procedure in detail---   The patient is brought into the operating room and positioned supine on the operating table. After successful administration of  Adductor canal block and spinal,   a tourniquet is placed high on the  Right thigh(s) and the lower extremity is prepped and draped in the usual sterile fashion. Time out is performed by the operating team and then the  Right lower extremity is wrapped in Esmarch, knee flexed and the tourniquet inflated to 300 mmHg.       A midline incision is made with a ten blade through the subcutaneous tissue to the level of the extensor mechanism. A fresh blade is used to make a medial parapatellar arthrotomy. Soft tissue over the proximal medial tibia is subperiosteally elevated to the joint line with a knife and into the semimembranosus bursa with a  Cobb elevator. Soft tissue over the proximal lateral tibia is elevated with attention being paid to avoiding the patellar tendon on the tibial tubercle. The patella is everted, knee flexed 90 degrees and the ACL and PCL are removed. Findings are bone on bone medial and patellofemoral with large global osteophytes.        The drill is used to create a starting hole in the distal femur and the canal is thoroughly irrigated with sterile saline to remove the fatty contents. The 5 degree Right  valgus alignment guide is placed into the femoral canal and the distal femoral cutting block is pinned to remove 9 mm off the distal femur. Resection is made with an oscillating saw.      The tibia is subluxed forward and the menisci are removed. The extramedullary alignment guide is placed referencing proximally at the medial aspect of the tibial tubercle and distally along the second metatarsal axis and tibial crest. The block is pinned to remove 63mm off the more deficient medial  side. Resection is made with an oscillating saw. Size 5is the most appropriate size for the tibia and the proximal tibia is prepared with the modular drill and keel punch for that size.      The femoral sizing guide is placed and size 5 is most appropriate. Rotation is marked off the epicondylar axis and confirmed by creating a rectangular flexion gap at 90 degrees. The size 5 cutting block is pinned in this rotation and the anterior, posterior and chamfer cuts are made with the oscillating saw. The intercondylar block is then placed and that cut is made.  Trial size 5 tibial component, trial size 5 posterior stabilized femur and a 8  mm posterior stabilized rotating platform insert trial is placed. Full extension is achieved with excellent varus/valgus and anterior/posterior balance throughout full range of motion. The patella is everted and thickness measured to be 22  mm. Free hand resection is taken to 12 mm, a 35 template is placed, lug  holes are drilled, trial patella is placed, and it tracks normally. Osteophytes are removed off the posterior femur with the trial in place. All trials are removed and the cut bone surfaces prepared with pulsatile lavage. Cement is mixed and once ready for implantation, the size 5 tibial implant, size  5 posterior stabilized femoral component, and the size 35 patella are cemented in place and the patella is held with the clamp. The trial insert is placed and the knee held in full extension. The Exparel (20 ml mixed with 60 ml saline) is injected into the extensor mechanism, posterior capsule, medial and lateral gutters and subcutaneous tissues.  All extruded cement is removed and once the cement is hard the permanent 8 mm posterior stabilized rotating platform insert is placed into the tibial tray.      The wound is copiously irrigated with saline solution and the extensor mechanism closed with # 0 Stratofix suture. The tourniquet is released for a total tourniquet time of 35  minutes. Flexion against gravity is 140 degrees and the patella tracks normally. Subcutaneous tissue is closed with 2.0 vicryl and subcuticular with running 4.0 Monocryl. The incision is cleaned and dried and steri-strips and a bulky sterile dressing are applied. The limb is placed into a knee immobilizer and the patient is awakened and transported to recovery in stable condition.      Please note that a surgical assistant was a medical necessity for this procedure in order to perform it in a safe and expeditious manner. Surgical assistant was necessary to retract the ligaments and vital neurovascular structures to prevent injury to them and also necessary for proper positioning of the limb to allow for anatomic placement of the prosthesis.   Dione Plover Acadia Thammavong, MD    10/30/2020, 12:24 PM

## 2020-10-30 NOTE — Progress Notes (Signed)
Orthopedic Tech Progress Note Patient Details:  Travis [name] 1961/06/21 [id number]  CPM Right Knee CPM Right Knee: On Right Knee Flexion (Degrees): 40 Right Knee Extension (Degrees): 10  Post Interventions Patient Tolerated: Well Instructions Provided: Care of device Ortho Devices Ortho Device/Splint Interventions: Ordered,Application,Adjustment   Post Interventions Patient Tolerated: Well Instructions Provided: Care of device   Braulio Bosch 10/30/2020, 1:00 PM

## 2020-10-30 NOTE — Transfer of Care (Signed)
Immediate Anesthesia Transfer of Care Note  Patient: Travis [name]  Procedure(s) Performed: TOTAL KNEE ARTHROPLASTY (Right Knee)  Patient Location: PACU  Anesthesia Type:Spinal and MAC combined with regional for post-op pain  Level of Consciousness: awake, alert  and patient cooperative  Airway & Oxygen Therapy: Patient Spontanous Breathing and Patient connected to face mask oxygen  Post-op Assessment: Report given to RN and Post -op Vital signs reviewed and stable  Post vital signs: Reviewed and stable  Last Vitals:  Vitals Value Taken Time  BP 109/76 10/30/20 1245  Temp    Pulse 85 10/30/20 1247  Resp    SpO2 99 % 10/30/20 1247  Vitals shown include unvalidated device data.  Last Pain:  Vitals:   10/30/20 0906  TempSrc:   PainSc: 5       Patients Stated Pain Goal: 4 (90/24/09 7353)  Complications: No complications documented.

## 2020-10-30 NOTE — Evaluation (Signed)
Physical Therapy Evaluation Patient Details Name: Travis [name] MRN: [id number] DOB: 12-Feb-1961 Today's Date: 10/30/2020   History of Present Illness  Patient is 60 y.o. male s/p Rt TKA On 10/30/20 with PMH significant for OA, HTN, DM, schizoaffective disorder, and depression.  Clinical Impression  Pt is a 60y.o. male s/p Rt TKA POD 0. Pt reports that he is independent with mobility at baseline. Pt required MIN guard and verbal cues for sit to stand transfers. Pt required MIN assist for ambulation 165ft with cues for RW management and step to gait pattern with no LOB or knee buckling. PT reviewed therapeutic interventions for promotion of DVT prevention, pt demonstrated understanding. Recommend home with family support and OPPT. Pt will benefit from skilled PT to increase independence and safety with mobility. Acute therapy to follow up to progress functional mobility as able to ensure safe discharge home.      Follow Up Recommendations Follow surgeon's recommendation for DC plan and follow-up therapies;Outpatient PT    Equipment Recommendations  Rolling walker with 5" wheels    Recommendations for Other Services       Precautions / Restrictions Precautions Precautions: Fall Restrictions Weight Bearing Restrictions: No Other Position/Activity Restrictions: WBAT      Mobility  Bed Mobility Overal bed mobility: Needs Assistance Bed Mobility: Supine to Sit     Supine to sit: Supervision;HOB elevated     General bed mobility comments: use of bed rail and B UEs to scoot to EOB with supervision for safety    Transfers Overall transfer level: Needs assistance Equipment used: Rolling walker (2 wheeled) Transfers: Sit to/from Stand Sit to Stand: Min guard         General transfer comment: MIN guard for safety with cues for safe hand placement.  Ambulation/Gait Ambulation/Gait assistance: Min assist Gait Distance (Feet): 100 Feet Assistive device: Rolling walker (2  wheeled) Gait Pattern/deviations: Step-to pattern;Decreased stride length;Decreased weight shift to right Gait velocity: decr   General Gait Details: Pt performed pre gait marching with use of B UEs on RW and MIN guard from therapist for safety. MIN assist for safety with cues for step to gait pattern and RW management with no LOB.  Stairs            Wheelchair Mobility    Modified Rankin (Stroke Patients Only)       Balance Overall balance assessment: Needs assistance Sitting-balance support: Feet supported Sitting balance-Leahy Scale: Good     Standing balance support: During functional activity;Bilateral upper extremity supported Standing balance-Leahy Scale: Poor Standing balance comment: use of RW to maintain standing balance                             Pertinent Vitals/Pain Pain Assessment: 0-10 Pain Score: 0-No pain Pain Location: Rt knee Pain Descriptors / Indicators: Sore;Discomfort Pain Intervention(s): Limited activity within patient's tolerance;Monitored during session;Repositioned    Home Living Family/patient expects to be discharged to:: Private residence Living Arrangements: Spouse/significant other Available Help at Discharge: Family Type of Home: House Home Access: Stairs to enter Entrance Stairs-Rails: None Technical brewer of Steps: 2 Home Layout: One level Home Equipment: None Additional Comments: wife will be available to assist at home    Prior Function Level of Independence: Independent               Hand Dominance   Dominant Hand: Right    Extremity/Trunk Assessment   Upper Extremity Assessment Upper Extremity Assessment:  Overall WFL for tasks assessed    Lower Extremity Assessment Lower Extremity Assessment: RLE deficits/detail RLE Deficits / Details: pt with good quad set strength and 4+/5 B dorsi/plantar flexion strength. Pt able to complete full SLR with no extensor lag. RLE Sensation: decreased  light touch RLE Coordination: WNL    Cervical / Trunk Assessment Cervical / Trunk Assessment: Normal  Communication   Communication: No difficulties  Cognition Arousal/Alertness: Awake/alert Behavior During Therapy: WFL for tasks assessed/performed Overall Cognitive Status: Within Functional Limits for tasks assessed                                        General Comments      Exercises Total Joint Exercises Ankle Circles/Pumps: AROM;Both;20 reps;Seated Heel Slides: AROM;Right;5 reps;Seated   Assessment/Plan    PT Assessment Patient needs continued PT services  PT Problem List Decreased strength;Decreased range of motion;Decreased activity tolerance;Decreased balance;Decreased mobility;Decreased knowledge of use of DME;Pain       PT Treatment Interventions DME instruction;Stair training;Gait training;Therapeutic activities;Functional mobility training;Therapeutic exercise;Balance training;Patient/family education    PT Goals (Current goals can be found in the Care Plan section)  Acute Rehab PT Goals Patient Stated Goal: get back to his job, yard work, and work on ROM of Rt knee PT Goal Formulation: With patient/family Time For Goal Achievement: 11/06/20 Potential to Achieve Goals: Good    Frequency 7X/week   Barriers to discharge        Co-evaluation               AM-PAC PT "6 Clicks" Mobility  Outcome Measure Help needed turning from your back to your side while in a flat bed without using bedrails?: None Help needed moving from lying on your back to sitting on the side of a flat bed without using bedrails?: None Help needed moving to and from a bed to a chair (including a wheelchair)?: A Little Help needed standing up from a chair using your arms (e.g., wheelchair or bedside chair)?: A Little Help needed to walk in hospital room?: A Little Help needed climbing 3-5 steps with a railing? : A Lot 6 Click Score: 19    End of Session  Equipment Utilized During Treatment: Gait belt Activity Tolerance: Patient tolerated treatment well;No increased pain Patient left: in chair;with chair alarm set;with nursing/sitter in room;with family/visitor present Nurse Communication: Mobility status PT Visit Diagnosis: Unsteadiness on feet (R26.81);Muscle weakness (generalized) (M62.81);Pain Pain - Right/Left: Right Pain - part of body: Knee    Time: 3235-5732 PT Time Calculation (min) (ACUTE ONLY): 26 min   Charges:              Elna Breslow, SPT  Acute rehab    Lauren Youngblood 10/30/2020, 4:18 PM

## 2020-10-31 ENCOUNTER — Encounter (HOSPITAL_COMMUNITY): Payer: Self-pay | Admitting: Orthopedic Surgery

## 2020-10-31 DIAGNOSIS — M1711 Unilateral primary osteoarthritis, right knee: Secondary | ICD-10-CM | POA: Diagnosis not present

## 2020-10-31 LAB — BASIC METABOLIC PANEL
Anion gap: 7 (ref 5–15)
BUN: 16 mg/dL (ref 8–23)
CO2: 24 mmol/L (ref 22–32)
Calcium: 8.5 mg/dL — ABNORMAL LOW (ref 8.9–10.3)
Chloride: 105 mmol/L (ref 98–111)
Creatinine, Ser: 0.9 mg/dL (ref 0.61–1.24)
GFR, Estimated: >60 mL/min (ref >60)
Glucose, Bld: 189 mg/dL — ABNORMAL HIGH (ref 70–99)
Potassium: 4.2 mmol/L (ref 3.5–5.1)
Sodium: 136 mmol/L (ref 135–145)

## 2020-10-31 LAB — CBC
HCT: 36.7 % — ABNORMAL LOW (ref 39.0–52.0)
Hemoglobin: 11.6 g/dL — ABNORMAL LOW (ref 13.0–17.0)
MCH: 28.6 pg (ref 26.0–34.0)
MCHC: 31.6 g/dL (ref 30.0–36.0)
MCV: 90.6 fL (ref 80.0–100.0)
Platelets: 161 10*3/uL (ref 150–400)
RBC: 4.05 MIL/uL — ABNORMAL LOW (ref 4.22–5.81)
RDW: 12.9 % (ref 11.5–15.5)
WBC: 13.6 10*3/uL — ABNORMAL HIGH (ref 4.0–10.5)
nRBC: 0 % (ref 0.0–0.2)

## 2020-10-31 LAB — GLUCOSE, CAPILLARY
Glucose-Capillary: 177 mg/dL — ABNORMAL HIGH (ref 70–99)
Glucose-Capillary: 211 mg/dL — ABNORMAL HIGH (ref 70–99)

## 2020-10-31 MED ORDER — OXYCODONE HCL 5 MG PO TABS: 5.0000 mg | ORAL_TABLET | Freq: Four times a day (QID) | ORAL | 0 refills | Status: DC | PRN

## 2020-10-31 MED ORDER — ASPIRIN 325 MG PO TBEC: 325.0000 mg | DELAYED_RELEASE_TABLET | Freq: Two times a day (BID) | ORAL | 0 refills | Status: AC

## 2020-10-31 MED ORDER — CYCLOBENZAPRINE HCL 10 MG PO TABS: 10.0000 mg | ORAL_TABLET | Freq: Three times a day (TID) | ORAL | 0 refills | Status: DC | PRN

## 2020-10-31 NOTE — Progress Notes (Signed)
Subjective: 1 Day Post-Op Procedure(s) (LRB): TOTAL KNEE ARTHROPLASTY (Right) Patient reports pain as mild.   Patient seen in rounds by Dr. Wynelle Link. Patient is well, and has had no acute complaints or problems. States he is ready to go home. Denies chest pain or SOB. No issues overnight.  We will continue therapy today, ambulated 100' yesterday.  Objective: Vital signs in last 24 hours: Temp:  [97.8 F (36.6 C)-98.7 F (37.1 C)] 98.3 F (36.8 C) (03/08 0524) Pulse Rate:  [75-91] 82 (03/08 0524) Resp:  [13-21] 16 (03/08 0524) BP: (109-148)/(73-92) 130/84 (03/08 0524) SpO2:  [94 %-99 %] 96 % (03/08 0524) Weight:  [85.7 kg] 85.7 kg (03/07 0906)  Intake/Output from previous day:  Intake/Output Summary (Last 24 hours) at 10/31/2020 0736 Last data filed at 10/31/2020 0700 Gross per 24 hour  Intake 3970.32 ml  Output 3100 ml  Net 870.32 ml     Intake/Output this shift: No intake/output data recorded.  Labs: Recent Labs    10/31/20 0325  HGB 11.6*   Recent Labs    10/31/20 0325  WBC 13.6*  RBC 4.05*  HCT 36.7*  PLT 161   Recent Labs    10/31/20 0325  NA 136  K 4.2  CL 105  CO2 24  BUN 16  CREATININE 0.90  GLUCOSE 189*  CALCIUM 8.5*   No results for input(s): LABPT, INR in the last 72 hours.  Exam: General - Patient is Alert and Oriented Extremity - Neurologically intact Neurovascular intact Sensation intact distally Dressing - dressing C/D/I Motor Function - intact, moving foot and toes well on exam.   Past Medical History:  Diagnosis Date  . Arthritis   . Dyslipidemia   . Dyspnea    On exertion  . Fatigue    NAUSEA,BLOATING  . H. pylori infection 09/15/2006  . Hematochezia 08/30/2007  . History of nuclear stress test 04/03/2009   exercise myoview; normal pattern of perfusion; low risk scan   . Hypertension    treated  . NIDDM (non-insulin dependent diabetes mellitus) 2011   type 2  . Schizoaffective disorder ([place])   . Tobacco chew use   .  Unspecified vitamin D deficiency     Assessment/Plan: 1 Day Post-Op Procedure(s) (LRB): TOTAL KNEE ARTHROPLASTY (Right) Principal Problem:   OA (osteoarthritis) of knee Active Problems:   Primary osteoarthritis of right knee  Estimated body mass index is 32.44 kg/m as calculated from the following:   Height as of this encounter: 5\' 4"  (1.626 m).   Weight as of this encounter: 85.7 kg. Advance diet Up with therapy D/C IV fluids   Patient's anticipated LOS is less than 2 midnights, meeting these requirements: - Younger than 40 - Lives within 1 hour of care - Has a competent adult at home to recover with post-op recover - NO history of  - Chronic pain requiring opiods  - Diabetes  - Coronary Artery Disease  - Heart failure  - Heart attack  - Stroke  - DVT/VTE  - Cardiac arrhythmia  - Respiratory Failure/COPD  - Renal failure  - Anemia  - Advanced Liver disease  DVT Prophylaxis - Aspirin Weight bearing as tolerated. Continue therapy.  Plan is to go Home after hospital stay. Plan for discharge after 1-2 sessions of PT. Scheduled for OPPT at [facility]) Follow-up in the office in 2 weeks  [facility] was reviewed today prior to any opioid medications being prescribed to this patient.  Theresa Duty, PA-C Orthopedic Surgery (  [phone] 10/31/2020, 7:36 AM

## 2020-10-31 NOTE — Progress Notes (Signed)
Physical Therapy Treatment Patient Details Name: Travis [name] MRN: [id number] DOB: 06/27/1961 Today's Date: 10/31/2020    History of Present Illness Patient is 60 y.o. male s/p Rt TKA On 10/30/20 with PMH significant for OA, HTN, DM, schizoaffective disorder, and depression.    PT Comments    Pt ambulated 36' with RW, distance limited by 10/10 pain. Pt had 15 mg oxycodone prior to PT session. Instructed pt in TKA HEP, issued handout. Will return for stair training this afternoon as pt was too fatigued to attempt them this session.    Follow Up Recommendations  Follow surgeon's recommendation for DC plan and follow-up therapies;Outpatient PT     Equipment Recommendations  Rolling walker with 5" wheels    Recommendations for Other Services       Precautions / Restrictions Precautions Precautions: Fall;Knee Precaution Booklet Issued: Yes (comment) Precaution Comments: reviewed no pillow under knee Restrictions Weight Bearing Restrictions: No Other Position/Activity Restrictions: WBAT    Mobility  Bed Mobility Overal bed mobility: Needs Assistance Bed Mobility: Supine to Sit     Supine to sit: Min guard;HOB elevated     General bed mobility comments: use of bed rail and B UEs to scoot to EOB with min/guard of RLE for safety    Transfers Overall transfer level: Needs assistance Equipment used: Rolling walker (2 wheeled) Transfers: Sit to/from Stand Sit to Stand: Min assist         General transfer comment: VCs hand placement, min A to power up  Ambulation/Gait Ambulation/Gait assistance: Min Web designer (Feet): 80 Feet Assistive device: Rolling walker (2 wheeled) Gait Pattern/deviations: Step-to pattern;Decreased stride length;Decreased weight shift to right Gait velocity: decr   General Gait Details: VCs sequencing, distance limited by 10/10 R knee pain   Stairs             Wheelchair Mobility    Modified Rankin (Stroke Patients  Only)       Balance Overall balance assessment: Needs assistance Sitting-balance support: Feet supported Sitting balance-Leahy Scale: Good     Standing balance support: During functional activity;Bilateral upper extremity supported Standing balance-Leahy Scale: Poor Standing balance comment: use of RW to maintain standing balance                            Cognition Arousal/Alertness: Awake/alert Behavior During Therapy: WFL for tasks assessed/performed Overall Cognitive Status: Within Functional Limits for tasks assessed                                        Exercises Total Joint Exercises Ankle Circles/Pumps: AROM;Both;Seated;15 reps Quad Sets: AROM;Right;5 reps Short Arc Quad: AROM;AAROM;Right;5 reps Heel Slides: AROM;Right;5 reps;Seated Hip ABduction/ADduction: AROM;Right;5 reps;Supine Straight Leg Raises: AROM;AAROM;Right;5 reps;Supine (able to do SLR independently) Long Arc Quad: AROM;Right;5 reps;Seated Knee Flexion: AAROM;Right;10 reps;Seated Goniometric ROM: 5-55* AAROM R knee    General Comments        Pertinent Vitals/Pain Pain Score: 10-Worst pain ever Pain Location: Rt knee Pain Descriptors / Indicators: Sore;Discomfort;Grimacing;Guarding Pain Intervention(s): Limited activity within patient's tolerance;Monitored during session;Premedicated before session;Ice applied    Home Living                      Prior Function            PT Goals (current goals can now be found in the  care plan section) Acute Rehab PT Goals Patient Stated Goal: get back to his job, yard work, and work on ROM of Rt knee PT Goal Formulation: With patient/family Time For Goal Achievement: 11/06/20 Potential to Achieve Goals: Good Progress towards PT goals: Progressing toward goals    Frequency    7X/week      PT Plan      Co-evaluation              AM-PAC PT "6 Clicks" Mobility   Outcome Measure  Help needed turning  from your back to your side while in a flat bed without using bedrails?: None Help needed moving from lying on your back to sitting on the side of a flat bed without using bedrails?: None Help needed moving to and from a bed to a chair (including a wheelchair)?: A Little Help needed standing up from a chair using your arms (e.g., wheelchair or bedside chair)?: A Little Help needed to walk in hospital room?: A Little Help needed climbing 3-5 steps with a railing? : A Lot 6 Click Score: 19    End of Session Equipment Utilized During Treatment: Gait belt Activity Tolerance: Patient tolerated treatment well;No increased pain Patient left: in chair;with chair alarm set;with family/visitor present Nurse Communication: Mobility status PT Visit Diagnosis: Unsteadiness on feet (R26.81);Muscle weakness (generalized) (M62.81);Pain Pain - Right/Left: Right Pain - part of body: Knee     Time: 1040-1107 PT Time Calculation (min) (ACUTE ONLY): 27 min  Charges:  $Gait Training: 8-22 mins $Therapeutic Exercise: 8-22 mins                    Blondell Reveal Kistler PT 10/31/2020  Acute Rehabilitation Services Pager [phone] Office [phone]

## 2020-10-31 NOTE — Progress Notes (Signed)
Physical Therapy Treatment Patient Details Name: Travis [name] MRN: [id number] DOB: [birth year] Today's Date: 10/31/2020    History of Present Illness Patient is 60 y.o. male s/p Rt TKA On 10/30/20 with PMH significant for OA, HTN, DM, schizoaffective disorder, and depression.    PT Comments    Pt ambulated 27' with RW, distance limited by 9/10 R knee pain. Stair training completed, with wife present. He is ready to DC home from PT standpoint.  Follow Up Recommendations  Follow surgeon's recommendation for DC plan and follow-up therapies;Outpatient PT     Equipment Recommendations  Rolling walker with 5" wheels    Recommendations for Other Services       Precautions / Restrictions Precautions Precautions: Fall;Knee Precaution Booklet Issued: Yes (comment) Precaution Comments: reviewed no pillow under knee Restrictions Weight Bearing Restrictions: No Other Position/Activity Restrictions: WBAT    Mobility  Bed Mobility Overal bed mobility: Needs Assistance Bed Mobility: Sit to Supine     Supine to sit: Min guard;HOB elevated Sit to supine: Min assist   General bed mobility comments: min A for RLE into bed    Transfers Overall transfer level: Needs assistance Equipment used: Rolling walker (2 wheeled) Transfers: Sit to/from Stand Sit to Stand: Min assist         General transfer comment: VCs hand placement, min A to power up  Ambulation/Gait Ambulation/Gait assistance: Min guard Gait Distance (Feet): 70 Feet Assistive device: Rolling walker (2 wheeled) Gait Pattern/deviations: Step-to pattern;Decreased stride length;Decreased weight shift to right Gait velocity: decr   General Gait Details: VCs sequencing, distance limited by 9/10 R knee pain   Stairs Stairs: Yes Stairs assistance: Min assist Stair Management: No rails;With walker;Backwards;Step to pattern Number of Stairs: 2 General stair comments: wife present and assisted with stabilizing RW, VCs  sequencing   Wheelchair Mobility    Modified Rankin (Stroke Patients Only)       Balance Overall balance assessment: Needs assistance Sitting-balance support: Feet supported Sitting balance-Leahy Scale: Good     Standing balance support: During functional activity;Bilateral upper extremity supported Standing balance-Leahy Scale: Poor Standing balance comment: use of RW to maintain standing balance                            Cognition Arousal/Alertness: Awake/alert Behavior During Therapy: WFL for tasks assessed/performed Overall Cognitive Status: Within Functional Limits for tasks assessed                                        Exercises Total Joint Exercises Ankle Circles/Pumps: AROM;Both;Seated;15 reps Quad Sets: AROM;Right;5 reps Short Arc Quad: AROM;AAROM;Right;5 reps Heel Slides: AROM;Right;5 reps;Seated Hip ABduction/ADduction: AROM;Right;5 reps;Supine Straight Leg Raises: AROM;AAROM;Right;5 reps;Supine (able to do SLR independently) Long Arc Quad: AROM;Right;5 reps;Seated Knee Flexion: AAROM;Right;10 reps;Seated Goniometric ROM: 5-55* AAROM R knee    General Comments        Pertinent Vitals/Pain Pain Score: 9  Pain Location: Rt knee Pain Descriptors / Indicators: Sore;Discomfort;Grimacing;Guarding Pain Intervention(s): Limited activity within patient's tolerance;Monitored during session;Premedicated before session;Ice applied    Home Living                      Prior Function            PT Goals (current goals can now be found in the care plan section) Acute Rehab PT Goals Patient  Stated Goal: get back to his job, yard work, and work on [employer] of Rt knee PT Goal Formulation: With patient/family Time For Goal Achievement: 11/06/20 Potential to Achieve Goals: Good Progress towards PT goals: Progressing toward goals    Frequency    7X/week      PT Plan      Co-evaluation              AM-PAC PT "6  Clicks" Mobility   Outcome Measure  Help needed turning from your back to your side while in a flat bed without using bedrails?: None Help needed moving from lying on your back to sitting on the side of a flat bed without using bedrails?: None Help needed moving to and from a bed to a chair (including a wheelchair)?: A Little Help needed standing up from a chair using your arms (e.g., wheelchair or bedside chair)?: A Little Help needed to walk in hospital room?: A Little Help needed climbing 3-5 steps with a railing? : A Little 6 Click Score: 20    End of Session Equipment Utilized During Treatment: Gait belt Activity Tolerance: Patient limited by pain Patient left: with family/visitor present;in bed;with bed alarm set;with call bell/phone within reach Nurse Communication: Mobility status PT Visit Diagnosis: Unsteadiness on feet (R26.81);Muscle weakness (generalized) (M62.81);Pain Pain - Right/Left: Right Pain - part of body: Knee     Time: 1212-1222 PT Time Calculation (min) (ACUTE ONLY): 10 min  Charges:  $Gait Training: 8-22 mins                      Blondell Reveal Kistler PT 10/31/2020  Acute Rehabilitation Services Pager [phone] Office [phone]

## 2020-10-31 NOTE — TOC Transition Note (Signed)
Transition of Care [facility]) - CM/SW Discharge Note   Patient Details  Name: Travis [name] MRN: [id number] Date of Birth: October 07, 1960  Transition of Care [facility]) CM/SW Contact:  Lia Hopping, [place] Phone Number: 10/31/2020, 3:24 PM   Clinical Narrative:    Prearranged therapy plan: OPPT-[facility] [place] RW and 3 in 1 ordered through [place] and delivered to the patient bedside, prior to discharge.   Final next level of care: OP Rehab Barriers to Discharge: No Barriers Identified   Patient Goals and CMS Choice        Discharge Placement                       Discharge Plan and Services                DME Arranged: 3-N-1,Walker rolling DME Agency: [employer] Date DME Agency Contacted: 10/31/20 Time DME Agency Contacted: 0900 Representative spoke with at DME Agency: [place] ([place]) Interventions     Readmission Risk Interventions No flowsheet data found.

## 2020-11-06 NOTE — Discharge Summary (Signed)
Physician Discharge Summary   Patient ID: Travis [name] MRN: [id number] DOB/AGE: [birth year] 60 y.o.  Admit date: 10/30/2020 Discharge date: 10/31/2020  Primary Diagnosis: Osteoarthritis, right knee   Admission Diagnoses:  Past Medical History:  Diagnosis Date  . Arthritis   . Dyslipidemia   . Dyspnea    On exertion  . Fatigue    NAUSEA,BLOATING  . H. pylori infection 09/15/2006  . Hematochezia 08/30/2007  . History of nuclear stress test 04/03/2009   exercise myoview; normal pattern of perfusion; low risk scan   . Hypertension    treated  . NIDDM (non-insulin dependent diabetes mellitus) 2011   type 2  . Schizoaffective disorder ([place])   . Tobacco chew use   . Unspecified vitamin D deficiency    Discharge Diagnoses:   Principal Problem:   OA (osteoarthritis) of knee Active Problems:   Primary osteoarthritis of right knee  Estimated body mass index is 32.44 kg/m as calculated from the following:   Height as of this encounter: 5\' 4"  (1.626 m).   Weight as of this encounter: 85.7 kg.  Procedure:  Procedure(s) (LRB): TOTAL KNEE ARTHROPLASTY (Right)   Consults: None  HPI: Travis [name] is a 60 y.o. year old male with end stage OA of his right knee with progressively worsening pain and dysfunction. He has constant pain, with activity and at rest and significant functional deficits with difficulties even with ADLs. He has had extensive non-op management including analgesics, injections of cortisone and viscosupplements, and home exercise program, but remains in significant pain with significant dysfunction. Radiographs show bone on bone arthritis medial and patellofemoral. He presents now for right Total Knee Arthroplasty.    Laboratory Data: Admission on 10/30/2020, Discharged on 10/31/2020  Component Date Value Ref Range Status  . ABO/RH(D) 10/30/2020    Final                   Value:A POS Performed at [facility], [place] [address],  [place], [place] [address]   . Glucose-Capillary 10/30/2020 136* 70 - 99 mg/dL Final   Glucose reference range applies only to samples taken after fasting for at least 8 hours.  . Glucose-Capillary 10/30/2020 141* 70 - 99 mg/dL Final   Glucose reference range applies only to samples taken after fasting for at least 8 hours.  . Comment 1 10/30/2020 Document in Chart   Final  . Glucose-Capillary 10/30/2020 238* 70 - 99 mg/dL Final   Glucose reference range applies only to samples taken after fasting for at least 8 hours.  . WBC 10/31/2020 13.6* 4.0 - 10.5 K/uL Final  . RBC 10/31/2020 4.05* 4.22 - 5.81 MIL/uL Final  . Hemoglobin 10/31/2020 11.6* 13.0 - 17.0 g/dL Final  . HCT 10/31/2020 36.7* 39.0 - 52.0 % Final  . MCV 10/31/2020 90.6  80.0 - 100.0 fL Final  . MCH 10/31/2020 28.6  26.0 - 34.0 pg Final  . MCHC 10/31/2020 31.6  30.0 - 36.0 g/dL Final  . RDW 10/31/2020 12.9  11.5 - 15.5 % Final  . Platelets 10/31/2020 161  150 - 400 K/uL Final  . nRBC 10/31/2020 0.0  0.0 - 0.2 % Final   Performed at [facility], [place] [address] [place], [place] [address]  . Sodium 10/31/2020 136  135 - 145 mmol/L Final  . Potassium 10/31/2020 4.2  3.5 - 5.1 mmol/L Final  . Chloride 10/31/2020 105  98 - 111 mmol/L Final  . CO2 10/31/2020 24  22 -  32 mmol/L Final  . Glucose, Bld 10/31/2020 189* 70 - 99 mg/dL Final   Glucose reference range applies only to samples taken after fasting for at least 8 hours.  . BUN 10/31/2020 16  8 - 23 mg/dL Final  . Creatinine, Ser 10/31/2020 0.90  0.61 - 1.24 mg/dL Final  . Calcium 10/31/2020 8.5* 8.9 - 10.3 mg/dL Final  . GFR, Estimated 10/31/2020 >60  >60 mL/min Final   Comment: (NOTE) Calculated using the CKD-EPI Creatinine Equation (2021)   . Anion gap 10/31/2020 7  5 - 15 Final   Performed at [facility], [place] [address] [place], [place] [address]  . Glucose-Capillary 10/30/2020 252* 70 - 99 mg/dL Final   Glucose reference range  applies only to samples taken after fasting for at least 8 hours.  . Glucose-Capillary 10/31/2020 177* 70 - 99 mg/dL Final   Glucose reference range applies only to samples taken after fasting for at least 8 hours.  . Glucose-Capillary 10/31/2020 211* 70 - 99 mg/dL Final   Glucose reference range applies only to samples taken after fasting for at least 8 hours.  Hospital Outpatient Visit on 10/27/2020  Component Date Value Ref Range Status  . SARS Coronavirus 2 10/27/2020 NEGATIVE  NEGATIVE Final   Comment: (NOTE) SARS-CoV-2 target nucleic acids are NOT DETECTED.  The SARS-CoV-2 RNA is generally detectable in upper and lower respiratory specimens during the acute phase of infection. Negative results do not preclude SARS-CoV-2 infection, do not rule out co-infections with other pathogens, and should not be used as the sole basis for treatment or other patient management decisions. Negative results must be combined with clinical observations, patient history, and epidemiological information. The expected result is Negative.  Fact Sheet for Patients: SugarRoll.be  Fact Sheet for Healthcare Providers: https://www.woods-mathews.com/  This test is not yet approved or cleared by the [place] FDA and  has been authorized for detection and/or diagnosis of SARS-CoV-2 by FDA under an Emergency Use Authorization (EUA). This EUA will remain  in effect (meaning this test can be used) for the duration of the COVID-19 declaration under Se                          ction 564(b)(1) of the Act, 21 U.S.C. section 360bbb-3(b)(1), unless the authorization is terminated or revoked sooner.  Performed at [facility], [place] [address] [place], [place] [address]   Hospital Outpatient Visit on 10/20/2020  Component Date Value Ref Range Status  . Hgb A1c MFr Bld 10/20/2020 7.6* 4.8 - 5.6 % Final   Comment: (NOTE) Pre diabetes:           5.7%-6.4%  Diabetes:              >6.4%  Glycemic control for   <7.0% adults with diabetes   . Mean Plasma Glucose 10/20/2020 171.42  mg/dL Final   Performed at [place] [address], [place], [place] [address]  . MRSA, PCR 10/20/2020 NEGATIVE  NEGATIVE Final  . Staphylococcus aureus 10/20/2020 NEGATIVE  NEGATIVE Final   Comment: (NOTE) The Xpert SA Assay (FDA approved for NASAL specimens in patients 62 years of age and older), is one component of a comprehensive surveillance program. It is not intended to diagnose infection nor to guide or monitor treatment. Performed at [facility], [place] [address], [place],  [address]   . WBC 10/20/2020 9.2  4.0 - 10.5 K/uL  Final  . RBC 10/20/2020 5.08  4.22 - 5.81 MIL/uL Final  . Hemoglobin 10/20/2020 14.6  13.0 - 17.0 g/dL Final  . HCT 10/20/2020 45.4  39.0 - 52.0 % Final  . MCV 10/20/2020 89.4  80.0 - 100.0 fL Final  . MCH 10/20/2020 28.7  26.0 - 34.0 pg Final  . MCHC 10/20/2020 32.2  30.0 - 36.0 g/dL Final  . RDW 10/20/2020 13.2  11.5 - 15.5 % Final  . Platelets 10/20/2020 187  150 - 400 K/uL Final  . nRBC 10/20/2020 0.0  0.0 - 0.2 % Final   Performed at [facility], [place] [address] [place], [place] [address]  . Sodium 10/20/2020 141  135 - 145 mmol/L Final  . Potassium 10/20/2020 4.2  3.5 - 5.1 mmol/L Final  . Chloride 10/20/2020 106  98 - 111 mmol/L Final  . CO2 10/20/2020 23  22 - 32 mmol/L Final  . Glucose, Bld 10/20/2020 99  70 - 99 mg/dL Final   Glucose reference range applies only to samples taken after fasting for at least 8 hours.  . BUN 10/20/2020 17  8 - 23 mg/dL Final  . Creatinine, Ser 10/20/2020 0.99  0.61 - 1.24 mg/dL Final  . Calcium 10/20/2020 9.3  8.9 - 10.3 mg/dL Final  . Total Protein 10/20/2020 7.1  6.5 - 8.1 g/dL Final  . Albumin 10/20/2020 4.1  3.5 - 5.0 g/dL Final  . AST 10/20/2020 26  15 - 41 U/L Final  . ALT 10/20/2020 30  0 - 44 U/L Final  .  Alkaline Phosphatase 10/20/2020 49  38 - 126 U/L Final  . Total Bilirubin 10/20/2020 0.6  0.3 - 1.2 mg/dL Final  . GFR, Estimated 10/20/2020 >60  >60 mL/min Final   Comment: (NOTE) Calculated using the CKD-EPI Creatinine Equation (2021)   . Anion gap 10/20/2020 12  5 - 15 Final   Performed at [facility], [place] [address] [place], [place] [address]  . Prothrombin Time 10/20/2020 12.3  11.4 - 15.2 seconds Final  . INR 10/20/2020 1.0  0.8 - 1.2 Final   Comment: (NOTE) INR goal varies based on device and disease states. Performed at [facility], [place] [address] [place], [place] [address]   . aPTT 10/20/2020 28  24 - 36 seconds Final   Performed at [facility], [place] [address], [place], [place] [address]  . ABO/RH(D) 10/20/2020 A POS   Final  . Antibody Screen 10/20/2020 NEG   Final  . Sample Expiration 10/20/2020 11/02/2020,2359   Final  . Extend sample reason 10/20/2020    Final                   Value:NO TRANSFUSIONS OR PREGNANCY IN THE PAST 3 MONTHS Performed at [place] [address], [place], [place] [address]   . Glucose-Capillary 10/20/2020 101* 70 - 99 mg/dL Final   Glucose reference range applies only to samples taken after fasting for at least 8 hours.     X-Rays:No results found.  EKG: Orders placed or performed during the hospital encounter of 10/20/20  . EKG 12 lead per protocol  . EKG 12 lead per protocol     Hospital Course: Kraig R [name] is a 60 y.o. who was admitted to [facility]. They were brought to the operating room on 10/30/2020 and underwent Procedure(s): Ad[place].  Patient tolerated the procedure well and was later transferred to the  recovery room and then to the orthopaedic floor for postoperative care. They were given PO and IV analgesics for pain control following their surgery. They were given 24 hours of postoperative antibiotics of   Anti-infectives (From admission, onward)   Start     Dose/Rate Route Frequency Ordered Stop   10/30/20 1700  ceFAZolin (ANCEF) IVPB 2g/100 mL premix        2 g 200 mL/hr over 30 Minutes Intravenous Every 6 hours 10/30/20 1407 10/30/20 2259   10/30/20 0850  ceFAZolin (ANCEF) 2-4 GM/100ML-% IVPB       Note to Pharmacy: Randa Evens  : cabinet override      10/30/20 0850 10/30/20 1119   10/30/20 0845  ceFAZolin (ANCEF) IVPB 2g/100 mL premix        2 g 200 mL/hr over 30 Minutes Intravenous On call to O.R. 10/30/20 2297 10/30/20 1149     and started on DVT prophylaxis in the form of Aspirin.   PT and OT were ordered for total joint protocol. Discharge planning consulted to help with postop disposition and equipment needs.  Patient had a good night on the evening of surgery. They started to get up OOB with therapy on POD #0. Pt was seen during rounds and was ready to go home pending progress with therapy. He worked with therapy on POD #1 and was meeting his goals. Pt was discharged to home later that day in stable condition.  Diet: Diabetic diet Activity: WBAT Follow-up: in 2 weeks Disposition: Home with OPPT Discharged Condition: stable    Allergies as of 10/31/2020      Reactions   Doxycycline Other (See Comments)   Reaction:  Blisters    Gabapentin    Other reaction(s): Hallucinations, Lightheadedness   Methocarbamol    Other reaction(s): Confusion, Hallucinations   Lisinopril Swelling      Medication List    STOP taking these medications   EXCEDRIN PO   HYDROcodone-acetaminophen 10-325 MG tablet Commonly known as: NORCO   nicotine 21 mg/24hr patch Commonly known as: NICODERM CQ - dosed in mg/24 hours     TAKE these medications   acetaminophen 500 MG tablet Commonly known as: TYLENOL Take 1,000 mg by mouth every morning.   amLODipine 10 MG tablet Commonly known as: NORVASC Take 1 tablet (10 mg total) by mouth daily. For high blood pressure   aspirin 325 MG EC  tablet Take 1 tablet (325 mg total) by mouth 2 (two) times daily for 20 days. Then resume one 81 mg aspirin once a day. What changed:   medication strength  how much to take  when to take this  additional instructions   atorvastatin 20 MG tablet Commonly known as: LIPITOR Take 1 tablet (20 mg total) by mouth daily. For high cholesterol What changed: when to take this   busPIRone 15 MG tablet Commonly known as: BUSPAR Take 1 tablet (15 mg total) by mouth 3 (three) times daily. For anxiety   carbamazepine 200 MG tablet Commonly known as: TEGRETOL Take 200 mg by mouth 2 (two) times daily.   cyclobenzaprine 10 MG tablet Commonly known as: FLEXERIL Take 1 tablet (10 mg total) by mouth 3 (three) times daily as needed for muscle spasms.   docusate sodium 100 MG capsule Commonly known as: COLACE Take 1 capsule (100 mg total) by mouth daily. (May buy from over the counter): For constipation What changed:   when to take this  reasons to take this  additional instructions  FLUoxetine 20 MG capsule Commonly known as: PROZAC Take 60 mg by mouth every morning. What changed: Another medication with the same name was removed. Continue taking this medication, and follow the directions you see here.   furosemide 20 MG tablet Commonly known as: LASIX Take 20 mg by mouth daily.   glipiZIDE 10 MG tablet Commonly known as: GLUCOTROL Take 10 mg by mouth 2 (two) times daily. What changed: Another medication with the same name was removed. Continue taking this medication, and follow the directions you see here.   hydrOXYzine 25 MG tablet Commonly known as: ATARAX/VISTARIL Take 1 tablet (25 mg total) by mouth 3 (three) times daily as needed for anxiety.   Klor-Con M20 20 MEQ tablet Generic drug: potassium chloride SA Take 20 mEq by mouth at bedtime.   loratadine 10 MG tablet Commonly known as: CLARITIN Take 1 tablet (10 mg total) by mouth daily. (May buy from over the counter):  For allergies What changed: when to take this   losartan 50 MG tablet Commonly known as: COZAAR Take 50 mg by mouth 2 (two) times daily.   metFORMIN 1000 MG tablet Commonly known as: GLUCOPHAGE Take 1 tablet (1,000 mg total) by mouth daily. For diabetes management What changed: when to take this   nitroGLYCERIN 0.4 MG SL tablet Commonly known as: NITROSTAT Place 1 tablet (0.4 mg total) under the tongue every 5 (five) minutes as needed for chest pain.   oxyCODONE 5 MG immediate release tablet Commonly known as: Oxy IR/ROXICODONE Take 1-2 tablets (5-10 mg total) by mouth every 6 (six) hours as needed for severe pain.   pantoprazole 40 MG tablet Commonly known as: PROTONIX Take 1 tablet (40 mg total) by mouth daily. For acid reflux What changed: when to take this   pramipexole 1.5 MG tablet Commonly known as: MIRAPEX Take 1.5 mg by mouth at bedtime.   tamsulosin 0.4 MG Caps capsule Commonly known as: FLOMAX Take 1 capsule (0.4 mg total) by mouth daily after breakfast. For prostate health   traMADol 50 MG tablet Commonly known as: ULTRAM Take 50 mg by mouth every 12 (twelve) hours as needed for pain.   traZODone 100 MG tablet Commonly known as: DESYREL Take 1 tablet (100 mg total) by mouth at bedtime. For sleep       Follow-up Information    Gaynelle Arabian, MD. Schedule an appointment as soon as possible for a visit on 11/14/2020.   Specialty: Orthopedic Surgery Contact information: [address] [place] [address] [phone]               Signed: Theresa Duty, PA-C Orthopedic Surgery 11/06/2020, 10:27 AM

## 2020-11-22 ENCOUNTER — Other Ambulatory Visit: Payer: Self-pay

## 2020-11-22 ENCOUNTER — Emergency Department (HOSPITAL_COMMUNITY)
Admission: EM | Admit: 2020-11-22 | Discharge: 2020-11-23 | Disposition: A | Discharge status: Home / Self Care | Payer: 59 | Attending: Emergency Medicine | Admitting: Emergency Medicine

## 2020-11-22 ENCOUNTER — Inpatient Hospital Stay (HOSPITAL_COMMUNITY): Admission: RE | Admit: 2020-11-22 | Payer: 59 | Source: Ambulatory Visit

## 2020-11-22 ENCOUNTER — Encounter (HOSPITAL_COMMUNITY): Payer: Self-pay | Admitting: Emergency Medicine

## 2020-11-22 ENCOUNTER — Other Ambulatory Visit (HOSPITAL_COMMUNITY): Payer: Self-pay | Admitting: Orthopedic Surgery

## 2020-11-22 DIAGNOSIS — Z79899 Other long term (current) drug therapy: Secondary | ICD-10-CM | POA: Diagnosis not present

## 2020-11-22 DIAGNOSIS — M79661 Pain in right lower leg: Secondary | ICD-10-CM

## 2020-11-22 DIAGNOSIS — Z7984 Long term (current) use of oral hypoglycemic drugs: Secondary | ICD-10-CM | POA: Diagnosis not present

## 2020-11-22 DIAGNOSIS — E871 Hypo-osmolality and hyponatremia: Secondary | ICD-10-CM | POA: Diagnosis not present

## 2020-11-22 DIAGNOSIS — I10 Essential (primary) hypertension: Secondary | ICD-10-CM | POA: Diagnosis not present

## 2020-11-22 DIAGNOSIS — M79671 Pain in right foot: Secondary | ICD-10-CM | POA: Diagnosis not present

## 2020-11-22 DIAGNOSIS — Z96651 Presence of right artificial knee joint: Secondary | ICD-10-CM | POA: Diagnosis not present

## 2020-11-22 DIAGNOSIS — R000 Tachycardia, unspecified: Secondary | ICD-10-CM | POA: Diagnosis not present

## 2020-11-22 DIAGNOSIS — E119 Type 2 diabetes mellitus without complications: Secondary | ICD-10-CM | POA: Diagnosis not present

## 2020-11-22 DIAGNOSIS — M7989 Other specified soft tissue disorders: Secondary | ICD-10-CM | POA: Diagnosis not present

## 2020-11-22 DIAGNOSIS — D72829 Elevated white blood cell count, unspecified: Secondary | ICD-10-CM | POA: Insufficient documentation

## 2020-11-22 DIAGNOSIS — M25571 Pain in right ankle and joints of right foot: Secondary | ICD-10-CM | POA: Diagnosis present

## 2020-11-22 DIAGNOSIS — D649 Anemia, unspecified: Secondary | ICD-10-CM | POA: Diagnosis not present

## 2020-11-22 DIAGNOSIS — M25471 Effusion, right ankle: Secondary | ICD-10-CM

## 2020-11-22 LAB — BASIC METABOLIC PANEL
Anion gap: 12 (ref 5–15)
BUN: 23 mg/dL (ref 8–23)
CO2: 23 mmol/L (ref 22–32)
Calcium: 9.3 mg/dL (ref 8.9–10.3)
Chloride: 98 mmol/L (ref 98–111)
Creatinine, Ser: 1.02 mg/dL (ref 0.61–1.24)
GFR, Estimated: >60 mL/min (ref >60)
Glucose, Bld: 195 mg/dL — ABNORMAL HIGH (ref 70–99)
Potassium: 4.6 mmol/L (ref 3.5–5.1)
Sodium: 133 mmol/L — ABNORMAL LOW (ref 135–145)

## 2020-11-22 LAB — CBC
HCT: 40.5 % (ref 39.0–52.0)
Hemoglobin: 12.6 g/dL — ABNORMAL LOW (ref 13.0–17.0)
MCH: 28.1 pg (ref 26.0–34.0)
MCHC: 31.1 g/dL (ref 30.0–36.0)
MCV: 90.4 fL (ref 80.0–100.0)
Platelets: 303 10*3/uL (ref 150–400)
RBC: 4.48 MIL/uL (ref 4.22–5.81)
RDW: 14.2 % (ref 11.5–15.5)
WBC: 15.3 10*3/uL — ABNORMAL HIGH (ref 4.0–10.5)
nRBC: 0 % (ref 0.0–0.2)

## 2020-11-22 MED ORDER — ONDANSETRON HCL 4 MG/2ML IJ SOLN
4.0000 mg | Freq: Once | INTRAMUSCULAR | Status: AC
  Administered 2020-11-23: 4 mg via INTRAVENOUS
  Filled 2020-11-22: qty 2

## 2020-11-22 MED ORDER — OXYCODONE-ACETAMINOPHEN 5-325 MG PO TABS
2.0000 | ORAL_TABLET | Freq: Once | ORAL | Status: AC
  Administered 2020-11-22: 2 via ORAL
  Filled 2020-11-22: qty 2

## 2020-11-22 MED ORDER — HYDROMORPHONE HCL 1 MG/ML IJ SOLN
1.0000 mg | Freq: Once | INTRAMUSCULAR | Status: AC
  Administered 2020-11-23: 1 mg via INTRAVENOUS
  Filled 2020-11-22: qty 1

## 2020-11-22 NOTE — ED Triage Notes (Signed)
Pt to the ED with rt leg pain after knee replacement on March 7th.  Pt was sent to the ED to rule out Blood clots.

## 2020-11-22 NOTE — ED Provider Notes (Signed)
[facility] EMERGENCY DEPARTMENT Provider Note   CSN: [id number] Arrival date & time: 11/22/20  1945   History Chief Complaint  Patient presents with  . Leg Pain    Travis [name] is a 60 y.o. male.  The history is provided by the patient.  Leg Pain He has history of diabetes, hypertension, hyperlipidemia and is status post right total knee replacement on 3/7 and comes in because of pain in his right heel and ankle which started 3 days ago.  Pain is severe and he rates it at 10/10.  It is worse if he tries to put weight on it.  Pain does radiate up into his calf.  He denies any chest pain, dyspnea.  He contacted his orthopedic surgeon who tried to get him to go for a venous Doppler, but he was not able to make it.  He has taken oxycodone for pain without relief.  Past Medical History:  Diagnosis Date  . Arthritis   . Dyslipidemia   . Dyspnea    On exertion  . Fatigue    NAUSEA,BLOATING  . H. pylori infection 09/15/2006  . Hematochezia 08/30/2007  . History of nuclear stress test 04/03/2009   exercise myoview; normal pattern of perfusion; low risk scan   . Hypertension    treated  . NIDDM (non-insulin dependent diabetes mellitus) 2011   type 2  . Schizoaffective disorder ([place])   . Tobacco chew use   . Unspecified vitamin D deficiency     Patient Active Problem List   Diagnosis Date Noted  . OA (osteoarthritis) of knee 10/30/2020  . Primary osteoarthritis of right knee 10/30/2020  . Major depressive disorder, recurrent episode, severe, with psychosis ([place]) 04/19/2017  . MDD (major depressive disorder) 04/16/2017  . AKI (acute kidney injury) ([place])   . Lactic acid acidosis   . Left sided numbness   . Sepsis ([place])   . Elevated lactic acid level   . Leukocytosis   . Volume depletion   . Hypotension 03/13/2017  . Chest pain with moderate risk for cardiac etiology 01/13/2014  . Dyspnea 01/13/2014  . Family history of coronary artery disease- (mother had MI 48)  01/13/2014  . Tobacco chew use   . Type 2 diabetes mellitus ([place])   . Dyslipidemia   . Unspecified vitamin D deficiency   . 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 Esophageal reflux 09/28/2007  . DYSPHAGIA UNSPECIFIED 09/28/2007  . BLOOD IN STOOL, OCCULT 09/28/2007  . HERNIORRHAPHY, HX OF 09/28/2007    Past Surgical History:  Procedure Laterality Date  . APPENDECTOMY    . HERNIA REPAIR     RIGHT  . KNEE SURGERY     BOTH  . TOTAL KNEE ARTHROPLASTY Right 10/30/2020   Procedure: TOTAL KNEE ARTHROPLASTY;  Surgeon: Gaynelle Arabian, MD;  Location: [facility]S;  Service: Orthopedics;  Laterality: Right;  107min  . TRANSTHORACIC ECHOCARDIOGRAM  04/03/2009   EF=>55%; trace TR, mild pulm regurg       Family History  Problem Relation Age of Onset  . Stroke Father 23  . Pneumonia Father   . Heart failure Mother 17  . Coronary artery disease Mother        also MI  . Heart attack Mother   . Heart attack Maternal Uncle        died in 23s  . Cancer Other        x2; maternal side  . Diabetes Maternal Grandmother     Social History  Tobacco Use  . Smoking status: Never Smoker  . Smokeless tobacco: Current User    Types: Snuff, Chew  . Tobacco comment: started chewing tobacco at age 20 years old-stopped chewing and dips snuff only  Vaping Use  . Vaping Use: Never used  Substance Use Topics  . Alcohol use: No    Alcohol/week: 0.0 standard drinks  . Drug use: No    Home Medications Prior to Admission medications   Medication Sig Start Date End Date Taking? Authorizing Provider  acetaminophen (TYLENOL) 500 MG tablet Take 1,000 mg by mouth every morning.    [provider]  amLODipine (NORVASC) 10 MG tablet Take 1 tablet (10 mg total) by mouth daily. For high blood pressure 10/15/18   Nwoko, Herbert Pun I, NP  atorvastatin (LIPITOR) 20 MG tablet Take 1 tablet (20 mg total) by mouth daily. For high cholesterol Patient taking differently: Take 20 mg by mouth every  evening. For high cholesterol 10/15/18   Nwoko, Herbert Pun I, NP  busPIRone (BUSPAR) 15 MG tablet Take 1 tablet (15 mg total) by mouth 3 (three) times daily. For anxiety Patient not taking: No sig reported 10/15/18   Lindell Spar I, NP  carbamazepine (TEGRETOL) 200 MG tablet Take 200 mg by mouth 2 (two) times daily. 09/28/20   [provider]  cyclobenzaprine (FLEXERIL) 10 MG tablet Take 1 tablet (10 mg total) by mouth 3 (three) times daily as needed for muscle spasms. 10/31/20   Edmisten, Kristie L, PA  docusate sodium (COLACE) 100 MG capsule Take 1 capsule (100 mg total) by mouth daily. (May buy from over the counter): For constipation Patient taking differently: Take 100 mg by mouth daily as needed for mild constipation. 10/15/18   Lindell Spar I, NP  FLUoxetine (PROZAC) 20 MG capsule Take 60 mg by mouth every morning. 10/05/20   [provider]  furosemide (LASIX) 20 MG tablet Take 20 mg by mouth daily. 10/12/20   [provider]  glipiZIDE (GLUCOTROL) 10 MG tablet Take 10 mg by mouth 2 (two) times daily. 09/28/20   [provider]  hydrOXYzine (ATARAX/VISTARIL) 25 MG tablet Take 1 tablet (25 mg total) by mouth 3 (three) times daily as needed for anxiety. 10/15/18   NwokoHerbert Pun I, NP  KLOR-CON M20 20 MEQ tablet Take 20 mEq by mouth at bedtime. 09/28/20   [provider]  loratadine (CLARITIN) 10 MG tablet Take 1 tablet (10 mg total) by mouth daily. (May buy from over the counter): For allergies Patient taking differently: Take 10 mg by mouth at bedtime. (May buy from over the counter): For allergies 10/15/18   Lindell Spar I, NP  losartan (COZAAR) 50 MG tablet Take 50 mg by mouth 2 (two) times daily.    [provider]  metFORMIN (GLUCOPHAGE) 1000 MG tablet Take 1 tablet (1,000 mg total) by mouth daily. For diabetes management Patient taking differently: Take 1,000 mg by mouth 2 (two) times daily with a meal. For diabetes management 10/15/18   Lindell Spar I, NP   nitroGLYCERIN (NITROSTAT) 0.4 MG SL tablet Place 1 tablet (0.4 mg total) under the tongue every 5 (five) minutes as needed for chest pain. 10/15/18   Lindell Spar I, NP  oxyCODONE (OXY IR/ROXICODONE) 5 MG immediate release tablet Take 1-2 tablets (5-10 mg total) by mouth every 6 (six) hours as needed for severe pain. 10/31/20   Edmisten, Kristie L, PA  pantoprazole (PROTONIX) 40 MG tablet Take 1 tablet (40 mg total) by mouth daily. For  acid reflux Patient taking differently: Take 40 mg by mouth at bedtime. For acid reflux 10/15/18   Lindell Spar I, NP  pramipexole (MIRAPEX) 1.5 MG tablet Take 1.5 mg by mouth at bedtime.    [provider]  tamsulosin (FLOMAX) 0.4 MG CAPS capsule Take 1 capsule (0.4 mg total) by mouth daily after breakfast. For prostate health 10/15/18   Lindell Spar I, NP  traMADol (ULTRAM) 50 MG tablet Take 50 mg by mouth every 12 (twelve) hours as needed for pain.    [provider]  traZODone (DESYREL) 100 MG tablet Take 1 tablet (100 mg total) by mouth at bedtime. For sleep 10/15/18   Lindell Spar I, NP    Allergies    Doxycycline, Gabapentin, Methocarbamol, and Lisinopril  Review of Systems   Review of Systems  All other systems reviewed and are negative.   Physical Exam Updated Vital Signs BP (!) 150/93 (BP Location: Right Arm)   Pulse (!) 142   Temp 98 F (36.7 C) (Oral)   Resp 17   Ht 5\' 5"  (1.651 m)   Wt 80.3 kg   SpO2 96%   BMI 29.45 kg/m   Physical Exam Vitals and nursing note reviewed.   60 year old male, resting comfortably and in no acute distress. Vital signs are significant for elevated blood pressure and elevated heart rate. Oxygen saturation is 96%, which is normal. Head is normocephalic and atraumatic. PERRLA, EOMI. Oropharynx is clear. Neck is nontender and supple without adenopathy or JVD. Back is nontender and there is no CVA tenderness. Lungs are clear without rales, wheezes, or rhonchi. Chest is nontender. Heart is  tachycardic without murmur. Abdomen is soft, flat, nontender without masses or hepatosplenomegaly and peristalsis is normoactive. Extremities: Surgical scar present right knee with mild swelling of the joint but no erythema or warmth.  There is erythema, warmth, swelling of the lateral aspect of the right ankle.  There is marked tenderness in the same area.  There is mild right calf tenderness, but calf circumference is symmetric.  Dorsalis pedis pulses strong, capillary refill is prompt.  There is marked pain with any movement of the right ankle. Skin is warm and dry without rash. Neurologic: Mental status is normal, cranial nerves are intact, there are no motor or sensory deficits.  ED Results / Procedures / Treatments   Labs (all labs ordered are listed, but only abnormal results are displayed) Labs Reviewed  CBC - Abnormal; Notable for the following components:      Result Value   WBC 15.3 (*)    Hemoglobin 12.6 (*)    All other components within normal limits  BASIC METABOLIC PANEL - Abnormal; Notable for the following components:   Sodium 133 (*)    Glucose, Bld 195 (*)    All other components within normal limits  URIC ACID    EKG EKG Interpretation  Date/Time:  Wednesday November 22 2020 23:59:26 EDT Ventricular Rate:  124 PR Interval:  129 QRS Duration: 95 QT Interval:  299 QTC Calculation: 430 R Axis:   43 Text Interpretation: Sinus tachycardia Otherwise within normal limits When compared with ECG of 10/20/2020, has increased Confirmed by Delora Fuel ([id number]) on 11/23/2020 12:03:01 AM   Radiology CT Angio Chest PE W and/or Wo Contrast  Result Date: 11/23/2020 CLINICAL DATA:  Pulmonary embolism EXAM: CT ANGIOGRAPHY CHEST WITH CONTRAST TECHNIQUE: Multidetector CT imaging of the chest was performed using the standard protocol during bolus administration of intravenous contrast. Multiplanar CT image  reconstructions and MIPs were obtained to evaluate the vascular anatomy.  CONTRAST:  133mL OMNIPAQUE IOHEXOL 350 MG/ML SOLN COMPARISON:  03/13/2017 FINDINGS: Cardiovascular: There is adequate opacification of the pulmonary arterial tree. No intraluminal filling defect to suggest acute pulmonary embolism. The central pulmonary arteries are of normal caliber. No significant coronary artery calcification. Global cardiac size within normal limits. No pericardial effusion. Mild atherosclerotic calcification within the thoracic aorta. No aortic aneurysm. Mediastinum/Nodes: Thyroid unremarkable. No pathologic thoracic adenopathy. The esophagus is unremarkable. Lungs/Pleura: Lungs are clear. No pleural effusion or pneumothorax. Upper Abdomen: At least mild hepatic steatosis. No acute abnormality within the visualized upper abdomen. Musculoskeletal: No acute bone abnormality. No lytic or blastic bone lesion. Review of the MIP images confirms the above findings. IMPRESSION: No pulmonary embolism.  No acute intrathoracic pathology identified. Aortic Atherosclerosis (ICD10-I70.0). Electronically Signed   By: Fidela Salisbury MD   On: 11/23/2020 00:46    Procedures Procedures   Medications Ordered in ED Medications  HYDROmorphone (DILAUDID) injection 1 mg (has no administration in time range)  ondansetron (ZOFRAN) injection 4 mg (has no administration in time range)  oxyCODONE-acetaminophen (PERCOCET/ROXICET) 5-325 MG per tablet 2 tablet (2 tablets Oral Given 11/22/20 2120)    ED Course  I have reviewed the triage vital signs and the nursing notes.  Pertinent labs & imaging results that were available during my care of the patient were reviewed by me and considered in my medical decision making (see chart for details).  MDM Rules/Calculators/A&P Right ankle pain with warmth and swelling appears to be gout.  However, in the postop setting following knee replacement, need to consider possibility of DVT.  He does have calf tenderness although no calf swelling.  Also, he has significant  tachycardia.  While this may be just because of pain, it certainly would be consistent with pulmonary embolism, so he will be sent for CT angiogram of the chest.  Labs show leukocytosis which is nonspecific, mild anemia which is actually improved compared with recent value.  Mild hyponatremia is not felt to be clinically significant.  ECG shows sinus tachycardia, otherwise normal.  He will be given hydromorphone for pain and will check uric acid level.  Old records reviewed confirming recent right total knee arthroplasty.  CT angiogram shows no evidence of pulmonary embolism.  He will be treated empirically for gout, brought back in the morning for DVT study.  He is given a dose of rivaroxaban and prednisone and given prescription for prednisone.  He is to follow-up with his PCP and with his orthopedic physician.  He is still having pain with weightbearing, he is given crutches to use as needed.  Final Clinical Impression(s) / ED Diagnoses Final diagnoses:  Pain and swelling of right ankle  Sinus tachycardia  Normochromic normocytic anemia  Hyponatremia    Rx / DC Orders ED Discharge Orders         Ordered    predniSONE (DELTASONE) 50 MG tablet  Daily        11/23/20 0136    US Venous Img Lower Unilateral Right        11/23/20 7741           Delora Fuel, MD 28/78/67 [phone]

## 2020-11-22 NOTE — ED Provider Notes (Signed)
MSE was initiated and I personally evaluated the patient and placed orders (if any) at  9:11 PM on November 22, 2020.  Patient presented to the ED for evaluation of leg pain.  Patient had right knee replacement on March 7.  Patient states in the last few days he started having increasing pain in his right leg.  He feels a throbbing discomfort in his lower leg.  He called his doctor and it sounds like they wanted him to have a Doppler study to make sure he did not have a blood clot.  Patient states he was given an appointment but he could not get to it in time.  Patient came to the ED this evening.  He denies any fevers or chills.  No chest pain or shortness of breath.  Patient's initial vitals reviewed.  His heart rate was 142 according to the initial triage.  Patient on exam does not appear in any distress.  Will need to redo the vital signs.  If patient is tachycardic at 142 he will need more emergent evaluation patient appears stable so that the remainder of the MSE may be completed by another provider.   Dorie Rank, MD 11/22/20 2114

## 2020-11-22 NOTE — ED Provider Notes (Incomplete)
[facility] EMERGENCY DEPARTMENT Provider Note   CSN: [id number] Arrival date & time: 11/22/20  1945   History Chief Complaint  Patient presents with  . Leg Pain    Travis [name] is a 60 y.o. male.  The history is provided by the patient.  Leg Pain He has history of diabetes, hypertension, hyperlipidemia and is status post right total knee replacement on 3/7 and comes in because of pain in his right heel and ankle which started 3 days ago.  Pain is severe and he rates it at 10/10.  It is worse if he tries to put weight on it.  Pain does radiate up into his calf.  He denies any chest pain, dyspnea.  He contacted his orthopedic surgeon who tried to get him to go for a venous Doppler, but he was not able to make it.  He has taken oxycodone for pain without relief.  Past Medical History:  Diagnosis Date  . Arthritis   . Dyslipidemia   . Dyspnea    On exertion  . Fatigue    NAUSEA,BLOATING  . H. pylori infection 09/15/2006  . Hematochezia 08/30/2007  . History of nuclear stress test 04/03/2009   exercise myoview; normal pattern of perfusion; low risk scan   . Hypertension    treated  . NIDDM (non-insulin dependent diabetes mellitus) 2011   type 2  . Schizoaffective disorder ([place])   . Tobacco chew use   . Unspecified vitamin D deficiency     Patient Active Problem List   Diagnosis Date Noted  . OA (osteoarthritis) of knee 10/30/2020  . Primary osteoarthritis of right knee 10/30/2020  . Major depressive disorder, recurrent episode, severe, with psychosis ([place]) 04/19/2017  . MDD (major depressive disorder) 04/16/2017  . AKI (acute kidney injury) ([place])   . Lactic acid acidosis   . Left sided numbness   . Sepsis ([place])   . Elevated lactic acid level   . Leukocytosis   . Volume depletion   . Hypotension 03/13/2017  . Chest pain with moderate risk for cardiac etiology 01/13/2014  . Dyspnea 01/13/2014  . Family history of coronary artery disease- (mother had MI 62)  01/13/2014  . Tobacco chew use   . Type 2 diabetes mellitus ([place])   . Dyslipidemia   . Unspecified vitamin D deficiency   . 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 Esophageal reflux 09/28/2007  . DYSPHAGIA UNSPECIFIED 09/28/2007  . BLOOD IN STOOL, OCCULT 09/28/2007  . HERNIORRHAPHY, HX OF 09/28/2007    Past Surgical History:  Procedure Laterality Date  . APPENDECTOMY    . HERNIA REPAIR     RIGHT  . KNEE SURGERY     BOTH  . TOTAL KNEE ARTHROPLASTY Right 10/30/2020   Procedure: TOTAL KNEE ARTHROPLASTY;  Surgeon: Gaynelle Arabian, MD;  Location: [facility];  Service: Orthopedics;  Laterality: Right;  61min  . TRANSTHORACIC ECHOCARDIOGRAM  04/03/2009   EF=>55%; trace TR, mild pulm regurg       Family History  Problem Relation Age of Onset  . Stroke Father 59  . Pneumonia Father   . Heart failure Mother 13  . Coronary artery disease Mother        also MI  . Heart attack Mother   . Heart attack Maternal Uncle        died in 70s  . Cancer Other        x2; maternal side  . Diabetes Maternal Grandmother     Social History  Tobacco Use  . Smoking status: Never Smoker  . Smokeless tobacco: Current User    Types: Snuff, Chew  . Tobacco comment: started chewing tobacco at age 15 years old-stopped chewing and dips snuff only  Vaping Use  . Vaping Use: Never used  Substance Use Topics  . Alcohol use: No    Alcohol/week: 0.0 standard drinks  . Drug use: No    Home Medications Prior to Admission medications   Medication Sig Start Date End Date Taking? Authorizing Provider  acetaminophen (TYLENOL) 500 MG tablet Take 1,000 mg by mouth every morning.    [provider]  amLODipine (NORVASC) 10 MG tablet Take 1 tablet (10 mg total) by mouth daily. For high blood pressure 10/15/18   Nwoko, Herbert Pun I, NP  atorvastatin (LIPITOR) 20 MG tablet Take 1 tablet (20 mg total) by mouth daily. For high cholesterol Patient taking differently: Take 20 mg by mouth every  evening. For high cholesterol 10/15/18   Nwoko, Herbert Pun I, NP  busPIRone (BUSPAR) 15 MG tablet Take 1 tablet (15 mg total) by mouth 3 (three) times daily. For anxiety Patient not taking: No sig reported 10/15/18   Lindell Spar I, NP  carbamazepine (TEGRETOL) 200 MG tablet Take 200 mg by mouth 2 (two) times daily. 09/28/20   [provider]  cyclobenzaprine (FLEXERIL) 10 MG tablet Take 1 tablet (10 mg total) by mouth 3 (three) times daily as needed for muscle spasms. 10/31/20   Edmisten, Kristie L, PA  docusate sodium (COLACE) 100 MG capsule Take 1 capsule (100 mg total) by mouth daily. (May buy from over the counter): For constipation Patient taking differently: Take 100 mg by mouth daily as needed for mild constipation. 10/15/18   Lindell Spar I, NP  FLUoxetine (PROZAC) 20 MG capsule Take 60 mg by mouth every morning. 10/05/20   [provider]  furosemide (LASIX) 20 MG tablet Take 20 mg by mouth daily. 10/12/20   [provider]  glipiZIDE (GLUCOTROL) 10 MG tablet Take 10 mg by mouth 2 (two) times daily. 09/28/20   [provider]  hydrOXYzine (ATARAX/VISTARIL) 25 MG tablet Take 1 tablet (25 mg total) by mouth 3 (three) times daily as needed for anxiety. 10/15/18   NwokoHerbert Pun I, NP  KLOR-CON M20 20 MEQ tablet Take 20 mEq by mouth at bedtime. 09/28/20   [provider]  loratadine (CLARITIN) 10 MG tablet Take 1 tablet (10 mg total) by mouth daily. (May buy from over the counter): For allergies Patient taking differently: Take 10 mg by mouth at bedtime. (May buy from over the counter): For allergies 10/15/18   Lindell Spar I, NP  losartan (COZAAR) 50 MG tablet Take 50 mg by mouth 2 (two) times daily.    [provider]  metFORMIN (GLUCOPHAGE) 1000 MG tablet Take 1 tablet (1,000 mg total) by mouth daily. For diabetes management Patient taking differently: Take 1,000 mg by mouth 2 (two) times daily with a meal. For diabetes management 10/15/18   Lindell Spar I, NP   nitroGLYCERIN (NITROSTAT) 0.4 MG SL tablet Place 1 tablet (0.4 mg total) under the tongue every 5 (five) minutes as needed for chest pain. 10/15/18   Lindell Spar I, NP  oxyCODONE (OXY IR/ROXICODONE) 5 MG immediate release tablet Take 1-2 tablets (5-10 mg total) by mouth every 6 (six) hours as needed for severe pain. 10/31/20   Edmisten, Kristie L, PA  pantoprazole (PROTONIX) 40 MG tablet Take 1 tablet (40 mg total) by mouth daily. For  acid reflux Patient taking differently: Take 40 mg by mouth at bedtime. For acid reflux 10/15/18   Lindell Spar I, NP  pramipexole (MIRAPEX) 1.5 MG tablet Take 1.5 mg by mouth at bedtime.    [provider]  tamsulosin (FLOMAX) 0.4 MG CAPS capsule Take 1 capsule (0.4 mg total) by mouth daily after breakfast. For prostate health 10/15/18   Lindell Spar I, NP  traMADol (ULTRAM) 50 MG tablet Take 50 mg by mouth every 12 (twelve) hours as needed for pain.    [provider]  traZODone (DESYREL) 100 MG tablet Take 1 tablet (100 mg total) by mouth at bedtime. For sleep 10/15/18   Lindell Spar I, NP    Allergies    Doxycycline, Gabapentin, Methocarbamol, and Lisinopril  Review of Systems   Review of Systems  All other systems reviewed and are negative.   Physical Exam Updated Vital Signs BP (!) 150/93 (BP Location: Right Arm)   Pulse (!) 142   Temp 98 F (36.7 C) (Oral)   Resp 17   Ht 5\' 5"  (1.651 m)   Wt 80.3 kg   SpO2 96%   BMI 29.45 kg/m   Physical Exam Vitals and nursing note reviewed.   60 year old male, resting comfortably and in no acute distress. Vital signs are significant for elevated blood pressure and elevated heart rate. Oxygen saturation is 96%, which is normal. Head is normocephalic and atraumatic. PERRLA, EOMI. Oropharynx is clear. Neck is nontender and supple without adenopathy or JVD. Back is nontender and there is no CVA tenderness. Lungs are clear without rales, wheezes, or rhonchi. Chest is nontender. Heart is  tachycardic without murmur. Abdomen is soft, flat, nontender without masses or hepatosplenomegaly and peristalsis is normoactive. Extremities: Surgical scar present right knee with mild swelling of the joint but no erythema or warmth.  There is erythema, warmth, swelling of the lateral aspect of the right ankle.  There is marked tenderness in the same area.  There is mild right calf tenderness, but calf circumference is symmetric.  Dorsalis pedis pulses strong, capillary refill is prompt.  There is marked pain with any movement of the right ankle. Skin is warm and dry without rash. Neurologic: Mental status is normal, cranial nerves are intact, there are no motor or sensory deficits.  ED Results / Procedures / Treatments   Labs (all labs ordered are listed, but only abnormal results are displayed) Labs Reviewed  CBC - Abnormal; Notable for the following components:      Result Value   WBC 15.3 (*)    Hemoglobin 12.6 (*)    All other components within normal limits  BASIC METABOLIC PANEL - Abnormal; Notable for the following components:   Sodium 133 (*)    Glucose, Bld 195 (*)    All other components within normal limits  URIC ACID    EKG None  Radiology No results found.  Procedures Procedures {Remember to document critical care time when appropriate:1}  Medications Ordered in ED Medications  HYDROmorphone (DILAUDID) injection 1 mg (has no administration in time range)  ondansetron (ZOFRAN) injection 4 mg (has no administration in time range)  oxyCODONE-acetaminophen (PERCOCET/ROXICET) 5-325 MG per tablet 2 tablet (2 tablets Oral Given 11/22/20 2120)    ED Course  I have reviewed the triage vital signs and the nursing notes.  Pertinent labs & imaging results that were available during my care of the patient were reviewed by me and considered in my medical decision making (see chart  for details).    MDM Rules/Calculators/A&P                          *** Final Clinical  Impression(s) / ED Diagnoses Final diagnoses:  None    Rx / DC Orders ED Discharge Orders    None

## 2020-11-23 ENCOUNTER — Ambulatory Visit (HOSPITAL_BASED_OUTPATIENT_CLINIC_OR_DEPARTMENT_OTHER)
Admission: RE | Admit: 2020-11-23 | Discharge: 2020-11-23 | Disposition: A | Discharge status: Home / Self Care | Payer: 59 | Source: Ambulatory Visit | Attending: Cardiology | Admitting: Cardiology

## 2020-11-23 ENCOUNTER — Ambulatory Visit (HOSPITAL_COMMUNITY)
Admission: RE | Admit: 2020-11-23 | Payer: 59 | Source: Ambulatory Visit | Attending: Orthopedic Surgery | Admitting: Orthopedic Surgery

## 2020-11-23 ENCOUNTER — Encounter (HOSPITAL_COMMUNITY): Payer: Self-pay | Admitting: Radiology

## 2020-11-23 ENCOUNTER — Other Ambulatory Visit (HOSPITAL_COMMUNITY): Payer: Self-pay | Admitting: Orthopedic Surgery

## 2020-11-23 ENCOUNTER — Emergency Department (HOSPITAL_COMMUNITY): Payer: 59

## 2020-11-23 DIAGNOSIS — M79661 Pain in right lower leg: Secondary | ICD-10-CM

## 2020-11-23 DIAGNOSIS — M79662 Pain in left lower leg: Secondary | ICD-10-CM

## 2020-11-23 DIAGNOSIS — M7989 Other specified soft tissue disorders: Secondary | ICD-10-CM

## 2020-11-23 LAB — URIC ACID: Uric Acid, Serum: 5.3 mg/dL (ref 3.7–8.6)

## 2020-11-23 MED ORDER — IOHEXOL 350 MG/ML SOLN
100.0000 mL | Freq: Once | INTRAVENOUS | Status: AC | PRN
  Administered 2020-11-23: 100 mL via INTRAVENOUS

## 2020-11-23 MED ORDER — PREDNISONE 50 MG PO TABS: 50.0000 mg | ORAL_TABLET | Freq: Every day | ORAL | 0 refills | Status: DC

## 2020-11-23 MED ORDER — RIVAROXABAN 15 MG PO TABS
15.0000 mg | ORAL_TABLET | Freq: Once | ORAL | Status: AC
  Administered 2020-11-23: 15 mg via ORAL
  Filled 2020-11-23: qty 1

## 2020-11-23 MED ORDER — PREDNISONE 50 MG PO TABS
60.0000 mg | ORAL_TABLET | Freq: Once | ORAL | Status: AC
  Administered 2020-11-23: 60 mg via ORAL
  Filled 2020-11-23: qty 1

## 2020-11-23 NOTE — Discharge Instructions (Addendum)
Please return in the morning for vascular ultrasound to make sure that you do not have a blood clot in your leg.  In the meantime, you may take your oxycodone tablets as needed for pain.  Please apply ice to the area that is painful and keep it elevated.

## 2020-12-25 ENCOUNTER — Encounter: Payer: Self-pay | Admitting: Gastroenterology

## 2021-01-29 NOTE — Progress Notes (Signed)
Cardiology Office Note   Date:  01/30/2021   ID:  Travis [name], DOB 26-Apr-1961, MRN [id number]  PCP:  Redmond School, MD  Cardiologist:   Dorris Carnes, MD   PT referred for elevated heart rate      History of Present Illness: Travis [name] is a 60 y.o. male with a history of HTN, CKD, DM who is followed by Dr Gerarda Fraction   Seen in cliinci earlier this spring   HR noted to be elevated  106   Referred to cardiology    Pt did complain of some light headdenes   Positional   No syncope  Denies CP     Note that pt was seen in ED on 11/23/20 with SOB CT scan showed no PE  No signf coronary calcifications noted   Mild atherosclerois of aorta noted    The pt denies CP  Does say he gets SOB with bending over to reach shoes   Also soe SOB with activity     Current Meds  Medication Sig  . acetaminophen (TYLENOL) 500 MG tablet Take 1,000 mg by mouth every morning.  Marland Kitchen amLODipine (NORVASC) 10 MG tablet Take 1 tablet (10 mg total) by mouth daily. For high blood pressure  . aspirin EC 81 MG tablet Take 81 mg by mouth daily. Swallow whole.  Marland Kitchen atorvastatin (LIPITOR) 20 MG tablet Take 1 tablet (20 mg total) by mouth daily. For high cholesterol (Patient taking differently: Take 20 mg by mouth every evening. For high cholesterol)  . carbamazepine (TEGRETOL) 200 MG tablet Take 200 mg by mouth 2 (two) times daily.  . Cholecalciferol (VITAMIN D) 50 MCG (2000 UT) CAPS Take 50 Units by mouth daily. 50 mcg daily  . cyclobenzaprine (FLEXERIL) 10 MG tablet Take 1 tablet (10 mg total) by mouth 3 (three) times daily as needed for muscle spasms.  Marland Kitchen FLUoxetine (PROZAC) 20 MG capsule Take 60 mg by mouth every morning.  . furosemide (LASIX) 20 MG tablet Take 40 mg by mouth daily.  Marland Kitchen glipiZIDE (GLUCOTROL) 10 MG tablet Take 10 mg by mouth 2 (two) times daily.  . indomethacin (INDOCIN) 25 MG capsule Take 25 mg by mouth daily as needed. Takes 1-2 tablets daily as needed  . KLOR-CON M20 20 MEQ tablet Take 20 mEq by mouth at  bedtime.  Marland Kitchen losartan (COZAAR) 50 MG tablet Take 50 mg by mouth 2 (two) times daily.  . metFORMIN (GLUCOPHAGE) 1000 MG tablet Take 1,000 mg by mouth 2 (two) times daily with a meal.  . pantoprazole (PROTONIX) 40 MG tablet Take 1 tablet (40 mg total) by mouth daily. For acid reflux (Patient taking differently: Take 40 mg by mouth at bedtime. For acid reflux)  . pramipexole (MIRAPEX) 1.5 MG tablet Take 1.5 mg by mouth at bedtime.  . tamsulosin (FLOMAX) 0.4 MG CAPS capsule Take 1 capsule (0.4 mg total) by mouth daily after breakfast. For prostate health  . traZODone (DESYREL) 100 MG tablet Take 1 tablet (100 mg total) by mouth at bedtime. For sleep     Allergies:   Doxycycline, Gabapentin, Methocarbamol, and Lisinopril   Past Medical History:  Diagnosis Date  . Arthritis   . Dyslipidemia   . Dyspnea    On exertion  . Fatigue    NAUSEA,BLOATING  . H. pylori infection 09/15/2006  . Hematochezia 08/30/2007  . History of nuclear stress test 04/03/2009   exercise myoview; normal pattern of perfusion; low risk scan   . Hypertension  treated  . NIDDM (non-insulin dependent diabetes mellitus) 2011   type 2  . Schizoaffective disorder ([place])   . Tobacco chew use   . Unspecified vitamin D deficiency     Past Surgical History:  Procedure Laterality Date  . APPENDECTOMY    . HERNIA REPAIR     RIGHT  . KNEE SURGERY     BOTH  . TOTAL KNEE ARTHROPLASTY Right 10/30/2020   Procedure: TOTAL KNEE ARTHROPLASTY;  Surgeon: Gaynelle Arabian, MD;  Location: [facility];  Service: Orthopedics;  Laterality: Right;  60min  . TRANSTHORACIC ECHOCARDIOGRAM  04/03/2009   EF=>55%; trace TR, mild pulm regurg     Social History:  The patient  reports that he has never smoked. He quit smokeless tobacco use about 3 months ago.  His smokeless tobacco use included snuff and chew. He reports that he does not drink alcohol and does not use drugs.   Family History:  The patient's family history includes Cancer in an other  family member; Coronary artery disease in his mother; Diabetes in his maternal grandmother; Heart attack in his maternal uncle and mother; Heart failure (age of onset: 37) in his mother; Pneumonia in his father; Stroke (age of onset: 39) in his father.    ROS:  Please see the history of present illness. All other systems are reviewed and  Negative to the above problem except as noted.    PHYSICAL EXAM: VS:  BP 128/68 (BP Location: Right Arm)   Pulse 100   Ht 5\' 5"  (1.651 m)   Wt 192 lb (87.1 kg)   SpO2 97%   BMI 31.95 kg/m   GEN: Obese 60 yo , in no acute distress  HEENT: normal  Neck: no JVD, carotid bruits Cardiac: RRR; no murmurs, Tr LE edema  Respiratory:  clear to auscultation bilaterally, GI: soft, nontender, nondistended, + BS  No hepatomegaly  MS: no deformity Moving all extremities   Skin: warm and dry, no rash Neuro:  Strength and sensation are intact Psych: euthymic mood, full affect   EKG:  EKG is not ordered today.   Lipid Panel    Component Value Date/Time   CHOL 152 04/16/2017 0636   TRIG 209 (H) 04/16/2017 0636   HDL 23 (L) 04/16/2017 0636   CHOLHDL 6.6 04/16/2017 0636   VLDL 42 (H) 04/16/2017 0636   LDLCALC 87 04/16/2017 0636      Wt Readings from Last 3 Encounters:  01/30/21 192 lb (87.1 kg)  11/22/20 177 lb (80.3 kg)  10/30/20 189 lb (85.7 kg)      ASSESSMENT AND PLAN:  1  Tachycardia I am not convinced of an arrhythmia  In clinic today HR approx 100    Pt is asymtpomatic Will follow for now    Get echo     For now encouraged him to stay hydrated   2  Dyspnea  Pt with dyspnea with bending and some with exertion Will get echo to eval LV systolic , diastolic funciton.  3  Lipids   Last labs from Dr Gerarda Fraction were just done   Will get them to fax lipoids      Current medicines are reviewed at length with the patient today.  The patient does not have concerns regarding medicines.  Signed, Dorris Carnes, MD  01/30/2021 1:31 PM    [facility] [address], [place], [place]  [address] Phone: [phone]; Fax: [phone]  ology Office Note   Date:  01/29/2021   ID:  Travis [name], DOB 1961/04/18, MRN [id number]  PCP:  Redmond School, MD  Cardiologist:   Dorris Carnes, MD       History of Present Illness: Travis [name] is a 60 y.o. male with a history of chest pain and SOB   Followed by [facility]  Last seen in 2017   Had normal echo and normal stress test.  ALso has a hx of HTN and LE edema      Swelling in legs goes up and down    Little salt  TIghtness in L chest   Occurs when bend over   Has to catch breath    Also gets pains in L upper check  Occurs with and without activity    No outpatient medications have been marked as taking for the 01/30/21 encounter (Appointment) with Fay Records, MD.     Allergies:   Doxycycline, Gabapentin, Methocarbamol, and Lisinopril   Past Medical History:  Diagnosis Date  . Arthritis   . Dyslipidemia   . Dyspnea    On exertion  . Fatigue    NAUSEA,BLOATING  . H. pylori infection 09/15/2006  . Hematochezia 08/30/2007  . History of nuclear stress test 04/03/2009   exercise myoview; normal pattern of perfusion; low risk scan   . Hypertension    treated  . NIDDM (non-insulin dependent diabetes mellitus) 2011   type 2  . Schizoaffective disorder ([place])   . Tobacco chew use   . Unspecified vitamin D deficiency     Past Surgical History:  Procedure Laterality Date  . APPENDECTOMY    . HERNIA REPAIR     RIGHT  . KNEE SURGERY     BOTH  . TOTAL KNEE ARTHROPLASTY Right 10/30/2020   Procedure: TOTAL KNEE ARTHROPLASTY;  Surgeon: Gaynelle Arabian, MD;  Location: [facility];  Service: Orthopedics;  Laterality: Right;   69min  . TRANSTHORACIC ECHOCARDIOGRAM  04/03/2009   EF=>55%; trace TR, mild pulm regurg     Social History:  The patient  reports that he has never smoked. His smokeless tobacco use includes snuff and chew. He reports that he does not drink alcohol and does not use drugs.   Family History:  The patient's family history includes Cancer in an other family member; Coronary artery disease in his mother; Diabetes in his maternal grandmother; Heart attack in his maternal uncle and mother; Heart failure (age of onset: 77) in his mother; Pneumonia in his father; Stroke (age of onset: 47) in his father.    ROS:  Please see the history of present illness. All other systems are reviewed and  Negative to the above problem except as noted.    PHYSICAL EXAM: VS:  There were no vitals taken for this visit.  GEN: Well nourished, well developed, in no acute distress  HEENT: normal  Neck: no JVD, carotid bruits, or masses Cardiac: RRR; no murmurs, rubs, or gallops,no edema  Respiratory:  clear to auscultation bilaterally, normal work of breathing GI: soft, nontender, nondistended, + BS  No hepatomegaly  MS: no deformity Moving all extremities   Skin: warm and dry, no rash Neuro:  Strength and sensation are intact Psych: euthymic mood, full affect   EKG:  EKG is ordered today.   Lipid Panel    Component Value Date/Time   CHOL 152 04/16/2017 0636   TRIG 209 (H) 04/16/2017 0636   HDL 23 (L) 04/16/2017 0636   CHOLHDL  6.6 04/16/2017 0636   VLDL 42 (H) 04/16/2017 0636   LDLCALC 87 04/16/2017 0636      Wt Readings from Last 3 Encounters:  11/22/20 177 lb (80.3 kg)  10/30/20 189 lb (85.7 kg)  10/20/20 189 lb (85.7 kg)      ASSESSMENT AND PLAN:     Current medicines are reviewed at length with the patient today.  The patient does not have concerns regarding medicines.  Signed, Dorris Carnes, MD  01/29/2021 9:40 PM    [facility] [place], [place], [place]   [address] Phone: [phone]; Fax: [phone]

## 2021-01-30 ENCOUNTER — Other Ambulatory Visit: Payer: Self-pay

## 2021-01-30 ENCOUNTER — Ambulatory Visit (INDEPENDENT_AMBULATORY_CARE_PROVIDER_SITE_OTHER): Payer: 59 | Admitting: Internal Medicine

## 2021-01-30 ENCOUNTER — Encounter: Payer: Self-pay | Admitting: Internal Medicine

## 2021-01-30 VITALS — BP 128/68 | HR 100 | Ht 65.0 in | Wt 192.0 lb

## 2021-01-30 DIAGNOSIS — R0602 Shortness of breath: Secondary | ICD-10-CM

## 2021-01-30 NOTE — Patient Instructions (Signed)
Medication Instructions:  Your physician recommends that you continue on your current medications as directed. Please refer to the Current Medication list given to you today.  *If you need a refill on your cardiac medications before your next appointment, please call your pharmacy*   Lab Work: Requested lab work from your Primary Care Provider If you have labs (blood work) drawn today and your tests are completely normal, you will receive your results only by: Marland Kitchen MyChart Message (if you have MyChart) OR . A paper copy in the mail If you have any lab test that is abnormal or we need to change your treatment, we will call you to review the results.   Testing/Procedures: Your physician has requested that you have an echocardiogram. Echocardiography is a painless test that uses sound waves to create images of your heart. It provides your doctor with information about the size and shape of your heart and how well your heart's chambers and valves are working. This procedure takes approximately one hour. There are no restrictions for this procedure.     Follow-Up: At [facility], you and your health needs are our priority.  As part of our continuing mission to provide you with exceptional heart care, we have created designated Provider Care Teams.  These Care Teams include your primary Cardiologist (physician) and Advanced Practice Providers (APPs -  Physician Assistants and Nurse Practitioners) who all work together to provide you with the care you need, when you need it.  We recommend signing up for the patient portal called "MyChart".  Sign up information is provided on this After Visit Summary.  MyChart is used to connect with patients for Virtual Visits (Telemedicine).  Patients are able to view lab/test results, encounter notes, upcoming appointments, etc.  Non-urgent messages can be sent to your provider as well.   To learn more about what you can do with MyChart, go to  [email or web address].    Your next appointment:   Follow Up- To Be Determined  Other Instructions

## 2021-02-14 ENCOUNTER — Ambulatory Visit (HOSPITAL_COMMUNITY): Admission: RE | Admit: 2021-02-14 | Payer: 59 | Source: Ambulatory Visit

## 2021-03-16 ENCOUNTER — Other Ambulatory Visit (HOSPITAL_COMMUNITY): Payer: Self-pay | Admitting: Internal Medicine

## 2021-03-16 ENCOUNTER — Other Ambulatory Visit: Payer: Self-pay

## 2021-03-16 ENCOUNTER — Ambulatory Visit (HOSPITAL_COMMUNITY)
Admission: RE | Admit: 2021-03-16 | Discharge: 2021-03-16 | Disposition: A | Discharge status: Home / Self Care | Payer: 59 | Source: Ambulatory Visit | Attending: Internal Medicine | Admitting: Internal Medicine

## 2021-03-16 DIAGNOSIS — R059 Cough, unspecified: Secondary | ICD-10-CM | POA: Diagnosis present

## 2021-04-09 ENCOUNTER — Ambulatory Visit (HOSPITAL_COMMUNITY): Admission: RE | Admit: 2021-04-09 | Payer: 59 | Source: Ambulatory Visit

## 2021-07-16 ENCOUNTER — Other Ambulatory Visit: Payer: Self-pay | Admitting: Occupational Medicine

## 2021-07-16 ENCOUNTER — Other Ambulatory Visit: Payer: Self-pay

## 2021-07-16 ENCOUNTER — Ambulatory Visit: Payer: Self-pay

## 2021-07-16 DIAGNOSIS — M25561 Pain in right knee: Secondary | ICD-10-CM

## 2021-10-15 ENCOUNTER — Telehealth: Payer: Self-pay | Admitting: Internal Medicine

## 2021-10-15 NOTE — Telephone Encounter (Signed)
Pt has been scheduled with Dr. Kelton Pillar on 11/28/2021 @ 1:00 pm

## 2021-11-08 ENCOUNTER — Emergency Department (HOSPITAL_COMMUNITY)
Admission: EM | Admit: 2021-11-08 | Discharge: 2021-11-08 | Disposition: A | Discharge status: Home / Self Care | Payer: 59 | Attending: Emergency Medicine | Admitting: Emergency Medicine

## 2021-11-08 ENCOUNTER — Emergency Department (HOSPITAL_COMMUNITY): Payer: 59

## 2021-11-08 ENCOUNTER — Encounter (HOSPITAL_COMMUNITY): Payer: Self-pay | Admitting: Emergency Medicine

## 2021-11-08 ENCOUNTER — Other Ambulatory Visit: Payer: Self-pay

## 2021-11-08 DIAGNOSIS — I10 Essential (primary) hypertension: Secondary | ICD-10-CM | POA: Diagnosis not present

## 2021-11-08 DIAGNOSIS — Z79899 Other long term (current) drug therapy: Secondary | ICD-10-CM | POA: Diagnosis not present

## 2021-11-08 DIAGNOSIS — R079 Chest pain, unspecified: Secondary | ICD-10-CM | POA: Insufficient documentation

## 2021-11-08 DIAGNOSIS — E119 Type 2 diabetes mellitus without complications: Secondary | ICD-10-CM | POA: Insufficient documentation

## 2021-11-08 DIAGNOSIS — Z7984 Long term (current) use of oral hypoglycemic drugs: Secondary | ICD-10-CM | POA: Insufficient documentation

## 2021-11-08 DIAGNOSIS — Z7982 Long term (current) use of aspirin: Secondary | ICD-10-CM | POA: Diagnosis not present

## 2021-11-08 DIAGNOSIS — R600 Localized edema: Secondary | ICD-10-CM | POA: Diagnosis not present

## 2021-11-08 LAB — BASIC METABOLIC PANEL
Anion gap: 17 — ABNORMAL HIGH (ref 5–15)
BUN: 21 mg/dL (ref 8–23)
CO2: 18 mmol/L — ABNORMAL LOW (ref 22–32)
Calcium: 9.1 mg/dL (ref 8.9–10.3)
Chloride: 98 mmol/L (ref 98–111)
Creatinine, Ser: 1.19 mg/dL (ref 0.61–1.24)
GFR, Estimated: >60 mL/min (ref >60)
Glucose, Bld: 391 mg/dL — ABNORMAL HIGH (ref 70–99)
Potassium: 4.3 mmol/L (ref 3.5–5.1)
Sodium: 133 mmol/L — ABNORMAL LOW (ref 135–145)

## 2021-11-08 LAB — TROPONIN I (HIGH SENSITIVITY)
Troponin I (High Sensitivity): 4 ng/L (ref <18)
Troponin I (High Sensitivity): 5 ng/L (ref <18)

## 2021-11-08 LAB — CBC
HCT: 41.7 % (ref 39.0–52.0)
Hemoglobin: 13.2 g/dL (ref 13.0–17.0)
MCH: 28.4 pg (ref 26.0–34.0)
MCHC: 31.7 g/dL (ref 30.0–36.0)
MCV: 89.9 fL (ref 80.0–100.0)
Platelets: 189 10*3/uL (ref 150–400)
RBC: 4.64 MIL/uL (ref 4.22–5.81)
RDW: 13.2 % (ref 11.5–15.5)
WBC: 9.6 10*3/uL (ref 4.0–10.5)
nRBC: 0 % (ref 0.0–0.2)

## 2021-11-08 LAB — CBG MONITORING, ED: Glucose-Capillary: 329 mg/dL — ABNORMAL HIGH (ref 70–99)

## 2021-11-08 MED ORDER — INSULIN GLARGINE-YFGN 100 UNIT/ML ~~LOC~~ SOLN
5.0000 [IU] | Freq: Once | SUBCUTANEOUS | Status: AC
Start: 2021-11-08 — End: 2021-11-08
  Administered 2021-11-08: 5 [IU] via SUBCUTANEOUS
  Filled 2021-11-08: qty 0.05

## 2021-11-08 MED ORDER — LACTATED RINGERS IV BOLUS
1000.0000 mL | Freq: Once | INTRAVENOUS | Status: AC
  Administered 2021-11-08: 1000 mL via INTRAVENOUS

## 2021-11-08 NOTE — ED Provider Triage Note (Signed)
Emergency Medicine Provider Triage Evaluation Note ? ?Travis [name] , a 61 y.o. male  was evaluated in triage.  Pt complains of chest pain.  Patient states since waking up this morning he was nauseous initially had an episode of chest pain that resolved and then as he was driving this afternoon the pain returned with significant intensity.  He states it radiated to his back.  He denies lightheadedness, palpitations, or diaphoresis.  No cardiac history with the exception of hypertension.  States his mom had CAD.  ? ?Review of Systems  ?Positive: As above ?Negative: As above ? ?Physical Exam  ?BP (!) 152/94 (BP Location: Left Arm)   Pulse (!) 112   Temp 98.4 ?F (36.9 ?C)   Resp 16   SpO2 97%  ?Gen:   Awake, no distress   ?Resp:  Normal effort  ?MSK:   Moves extremities without difficulty  ?Other:   ? ?Medical Decision Making  ?Medically screening exam initiated at 3:22 PM.  Appropriate orders placed.  Travis [name] was informed that the remainder of the evaluation will be completed by another provider, this initial triage assessment does not replace that evaluation, and the importance of remaining in the ED until their evaluation is complete. ? ? ?  ?Evlyn Courier, PA-C ?11/08/21 1523 ? ?

## 2021-11-08 NOTE — ED Notes (Signed)
Pt care taken, denies any chest pain at this time. Awaiting repeat lab work. ?

## 2021-11-08 NOTE — ED Triage Notes (Addendum)
Patient complains of chest pain that started sometime this afternoon, patient states he is unable to estimate how long ago the pain started but does report the pain has resolved spontaneously. Also reports feeling nausea since early this morning. Patient also reports shortness of breath, denies emesis and dizziness. Patient is alert, oriented, speaking in complete sentences, and in no apparent distress at this time. ?

## 2021-11-08 NOTE — Discharge Instructions (Addendum)
You were evaluated in the Emergency Department and after careful evaluation, we did not find any emergent condition requiring admission or further testing in the hospital.  Your exam/testing today was overall reassuring.  Please return to the Emergency Department if you experience any worsening of your condition.  Thank you for allowing us to be a part of your care.  

## 2021-11-08 NOTE — ED Provider Notes (Signed)
?[facility] ?Provider Note ? ? ?CSN: [id number] ?Arrival date & time: 11/08/21  1500 ? ?  ? ?History ? ?Chief Complaint  ?Patient presents with  ? Chest Pain  ? ? ?Saulo R Martinique is a 61 y.o. male w/ PMHx of diabetes, hypertension, hyperlipidemia, and recent diagnosis of [place] spotted fever presenting for chest pain that started this morning and radiated into his back and left side of his chest.  He denies any nausea, vomiting, diaphoresis with the episode.  Wife reports she does have this rash on bilateral lower extremities that appeared in the last few weeks.  He was diagnosed with [facility] spotted fever after tick bite was started on doxycycline after tested positive via blood work however they told him to discontinue doxycycline because he broke out in a blistering rash on his genitals.  Patient has no active chest pain at this time.  He does report some back soreness and left lateral chest pain discomfort. ? ?Chest Pain ?Pain location:  L lateral chest ?Associated symptoms: no fatigue, no palpitations and no shortness of breath   ? ?  ? ?Home Medications ?Prior to Admission medications   ?Medication Sig Start Date End Date Taking? Authorizing Provider  ?acetaminophen (TYLENOL) 500 MG tablet Take 1,000 mg by mouth every morning.    [provider]  ?amLODipine (NORVASC) 10 MG tablet Take 1 tablet (10 mg total) by mouth daily. For high blood pressure 10/15/18   Lindell Spar I, NP  ?aspirin EC 81 MG tablet Take 81 mg by mouth daily. Swallow whole.    [provider]  ?atorvastatin (LIPITOR) 20 MG tablet Take 1 tablet (20 mg total) by mouth daily. For high cholesterol ?Patient taking differently: Take 20 mg by mouth every evening. For high cholesterol 10/15/18   Lindell Spar I, NP  ?carbamazepine (TEGRETOL) 200 MG tablet Take 200 mg by mouth 2 (two) times daily. 09/28/20   [provider]  ?Cholecalciferol (VITAMIN D) 50 MCG (2000 UT) CAPS Take  50 Units by mouth daily. 50 mcg daily    [provider]  ?cyclobenzaprine (FLEXERIL) 10 MG tablet Take 1 tablet (10 mg total) by mouth 3 (three) times daily as needed for muscle spasms. 10/31/20   Edmisten, Ok Anis, PA  ?FLUoxetine (PROZAC) 20 MG capsule Take 60 mg by mouth every morning. 10/05/20   [provider]  ?furosemide (LASIX) 20 MG tablet Take 40 mg by mouth daily. 10/12/20   [provider]  ?glipiZIDE (GLUCOTROL) 10 MG tablet Take 10 mg by mouth 2 (two) times daily. 09/28/20   [provider]  ?indomethacin (INDOCIN) 25 MG capsule Take 25 mg by mouth daily as needed. Takes 1-2 tablets daily as needed    [provider]  ?KLOR-CON M20 20 MEQ tablet Take 20 mEq by mouth at bedtime. 09/28/20   [provider]  ?losartan (COZAAR) 50 MG tablet Take 50 mg by mouth 2 (two) times daily.    [provider]  ?metFORMIN (GLUCOPHAGE) 1000 MG tablet Take 1,000 mg by mouth 2 (two) times daily with a meal.    [provider]  ?nitroGLYCERIN (NITROSTAT) 0.4 MG SL tablet Place 1 tablet (0.4 mg total) under the tongue every 5 (five) minutes as needed for chest pain. ?Patient not taking: Reported on 01/30/2021 10/15/18   Lindell Spar I, NP  ?pantoprazole (PROTONIX) 40 MG tablet Take 1 tablet (40 mg total) by mouth daily. For acid reflux ?Patient taking differently:  Take 40 mg by mouth at bedtime. For acid reflux 10/15/18   Lindell Spar I, NP  ?pramipexole (MIRAPEX) 1.5 MG tablet Take 1.5 mg by mouth at bedtime.    [provider]  ?tamsulosin (FLOMAX) 0.4 MG CAPS capsule Take 1 capsule (0.4 mg total) by mouth daily after breakfast. For prostate health 10/15/18   Lindell Spar I, NP  ?traZODone (DESYREL) 100 MG tablet Take 1 tablet (100 mg total) by mouth at bedtime. For sleep 10/15/18   Lindell Spar I, NP  ?   ? ?Allergies    ?Doxycycline, Gabapentin, Methocarbamol, and Lisinopril   ? ?Review of Systems   ?Review of Systems  ?Constitutional:  Negative for  chills and fatigue.  ?Respiratory:  Negative for shortness of breath.   ?Cardiovascular:  Positive for chest pain. Negative for palpitations.  ?Skin:  Positive for rash.  ? ?Physical Exam ?Updated Vital Signs ?BP (!) 152/94 (BP Location: Left Arm)   Pulse (!) 112   Temp 98.4 ?F (36.9 ?C)   Resp 16   SpO2 97%  ?Physical Exam ?Vitals and nursing note reviewed.  ?Constitutional:   ?   General: He is not in acute distress. ?   Appearance: He is not ill-appearing.  ?Cardiovascular:  ?   Rate and Rhythm: Normal rate and regular rhythm.  ?   Heart sounds: Normal heart sounds.  ?Pulmonary:  ?   Breath sounds: No decreased breath sounds.  ?Musculoskeletal:  ?   Right lower leg: Edema present.  ?   Left lower leg: Edema present.  ?   Comments: Rash to BLE  ?Skin: ?   General: Skin is warm and dry.  ?   Findings: No rash.  ?Neurological:  ?   Mental Status: He is alert and oriented to person, place, and time.  ? ? ?ED Results / Procedures / Treatments   ?Labs ?(all labs ordered are listed, but only abnormal results are displayed) ?Labs Reviewed  ?BASIC METABOLIC PANEL - Abnormal; Notable for the following components:  ?    Result Value  ? Sodium 133 (*)   ? CO2 18 (*)   ? Glucose, Bld 391 (*)   ? Anion gap 17 (*)   ? All other components within normal limits  ?CBG MONITORING, ED - Abnormal; Notable for the following components:  ? Glucose-Capillary 329 (*)   ? All other components within normal limits  ?CBC  ?TROPONIN I (HIGH SENSITIVITY)  ?TROPONIN I (HIGH SENSITIVITY)  ? ? ?EKG ?EKG Interpretation ? ?Date/Time:  Thursday November 08 2021 15:08:00 EDT ?Ventricular Rate:  111 ?PR Interval:  132 ?QRS Duration: 84 ?QT Interval:  328 ?QTC Calculation: 446 ?R Axis:   54 ?Text Interpretation: Sinus tachycardia Possible Inferior infarct , age undetermined Cannot rule out Anterior infarct , age undetermined Abnormal ECG Confirmed by Carmin Muskrat [phone]) on 11/08/2021 7:40:56 PM ? ?Radiology ?DG Chest 2 View ? ?Result Date:  11/08/2021 ?CLINICAL DATA:  Chest pain with shortness of breath and nausea. EXAM: CHEST - 2 VIEW COMPARISON:  March 16, 2021 FINDINGS: The heart size and mediastinal contours are within normal limits. Low lung volumes are noted. Both lungs are clear. Degenerative changes seen throughout the thoracic spine. IMPRESSION: No active cardiopulmonary disease. Electronically Signed   By: Virgina Norfolk M.D.   On: 11/08/2021 15:43   ? ?Procedures ?Procedures  ?Medications Ordered in ED ?Medications  ?lactated ringers bolus 1,000 mL (0 mLs Intravenous Stopped 11/08/21 2136)  ?insulin glargine-yfgn (SEMGLEE) injection 5 Units (  5 Units Subcutaneous Given 11/08/21 1958)  ? ? ?ED Course/ Medical Decision Making/ A&P ?  ?                        ?Medical Decision Making ?Amount and/or Complexity of Data Reviewed ?Labs: ordered. ?Radiology: ordered. ? ?Risk ?Prescription drug management. ? ? ?77 yoM w/ recent RMSF s/p incomplete course of doxy given adverse reaction presenting to the ED for CP and new BLE rash. Reviewed prior records to obtained PMHx and information.  ? ?On exam - no acute distress, tachycardic. Normal respiratory rate. Due to concern for ACS, delta troponin indicated. No rash or signs of shingles on exam. No PTX on CXR or decreased lungs sounds. Labs remarkable for Na 133, CO2 18, Glucose 391 w/ AG 17. Given 5 units of insulin. Pt is scheduled to follow-up with endocrine for better glucose management. CXR unremarkable with no focal finding on review which is confirmed by radiology. EKG review above but no acute ischemic findings. Pt given fluids which signficantly improved his tachycardia. Given overall reassuring assessment, no active CP, and negative troponin x2 - pt safe for outpatient cardiology follow-up and their PCP.  Ruled out acute threat to life by determining no STEMI or NSTEMI at this time. Given strict return precautions. Pt and Wife verbalized agreement with plan. ? ?Pt seen in conjunction with my  attending, Dr. Vanita Panda.  ? ?Final Clinical Impression(s) / ED Diagnoses ?Final diagnoses:  ?Chest pain, unspecified type  ? ? ?Rx / DC Orders ?ED Discharge Orders   ? ? None  ? ?  ? ? ?  ?Lupita Dawn, MD ?03/17/

## 2021-11-28 ENCOUNTER — Encounter: Payer: Self-pay | Admitting: Internal Medicine

## 2021-11-28 ENCOUNTER — Other Ambulatory Visit (HOSPITAL_COMMUNITY): Payer: Self-pay

## 2021-11-28 ENCOUNTER — Other Ambulatory Visit: Payer: Self-pay | Admitting: Internal Medicine

## 2021-11-28 ENCOUNTER — Ambulatory Visit (INDEPENDENT_AMBULATORY_CARE_PROVIDER_SITE_OTHER): Payer: 59 | Admitting: Internal Medicine

## 2021-11-28 VITALS — BP 110/68 | HR 100 | Ht 65.0 in | Wt 192.0 lb

## 2021-11-28 DIAGNOSIS — Z794 Long term (current) use of insulin: Secondary | ICD-10-CM

## 2021-11-28 DIAGNOSIS — E1142 Type 2 diabetes mellitus with diabetic polyneuropathy: Secondary | ICD-10-CM | POA: Diagnosis not present

## 2021-11-28 DIAGNOSIS — E1165 Type 2 diabetes mellitus with hyperglycemia: Secondary | ICD-10-CM | POA: Diagnosis not present

## 2021-11-28 DIAGNOSIS — R739 Hyperglycemia, unspecified: Secondary | ICD-10-CM

## 2021-11-28 LAB — POCT GLYCOSYLATED HEMOGLOBIN (HGB A1C): Hemoglobin A1C: 10.4 % — AB (ref 4.0–5.6)

## 2021-11-28 MED ORDER — LANTUS SOLOSTAR 100 UNIT/ML ~~LOC~~ SOPN: 16.0000 [IU] | PEN_INJECTOR | Freq: Every day | SUBCUTANEOUS | 6 refills | Status: DC

## 2021-11-28 MED ORDER — INSULIN GLARGINE 100 UNIT/ML ~~LOC~~ SOLN: 16.0000 [IU] | Freq: Every day | SUBCUTANEOUS | 11 refills | Status: DC

## 2021-11-28 MED ORDER — METFORMIN HCL 1000 MG PO TABS: 1000.0000 mg | ORAL_TABLET | Freq: Two times a day (BID) | ORAL | 3 refills | Status: DC

## 2021-11-28 MED ORDER — TRULICITY 0.75 MG/0.5ML ~~LOC~~ SOAJ: 0.7500 mg | SUBCUTANEOUS | 1 refills | Status: DC

## 2021-11-28 MED ORDER — DEXCOM G6 TRANSMITTER MISC: 1.0000 | 3 refills | Status: DC

## 2021-11-28 MED ORDER — DEXCOM G6 SENSOR MISC: 1.0000 | 3 refills | Status: DC

## 2021-11-28 MED ORDER — INSULIN SYRINGES (DISPOSABLE) U-100 0.3 ML MISC: 1.0000 | Freq: Every day | 11 refills | Status: DC

## 2021-11-28 MED ORDER — INSULIN PEN NEEDLE 32G X 4 MM MISC: 1.0000 | Freq: Every day | 3 refills | Status: DC

## 2021-11-28 NOTE — Progress Notes (Signed)
?Name: Travis [name]  ?MRN/ DOB: [id number], June 03, 1961   ?Age/ Sex: 61 y.o., male   ? ?PCP: Redmond School, MD   ?Reason for Endocrinology Evaluation: Type 2 Diabetes Mellitus  ?   ?Date of Initial Endocrinology Visit: 11/28/2021   ? ? ?PATIENT IDENTIFIER: [name] is a 61 y.o. male with a past medical history of T2DM, HTN, and dyslipidemia. The patient presented for initial endocrinology clinic visit on 11/28/2021 for consultative assistance with his diabetes management.  ? ? ?HPI: ?Travis [name] is accompanied bu his wife who is also a Marine scientist ? ? ?Diagnosed with DM in many years ago ?Currently checking blood sugars 1-2 x / day,  before breakfast an.  ?Hypoglycemia episodes : no               ?Hemoglobin A1c has ranged from 6.3% in 2018, peaking at 10.6% in 2012. ?Patient required assistance for hypoglycemia: no ?Patient has required hospitalization within the last 1 year from hyper or hypoglycemia: yes 2023 ? ?In terms of diet, the patient eats 3 meals a day, snacks three times a day, he does admits to eating candies, cookies. Drinks diet drinks.  ? ? ?HOME DIABETES REGIMEN: ?Metformin 1000 mg BID  ?Glipizide 10 mg BID  ?Acarbose 50 mg TID  ? ?Statin: Yes ?ACE-I/ARB: Yes ? ? ? ?METER DOWNLOAD SUMMARY: unable to download ?179- 338 mg/dL  ? ? ?DIABETIC COMPLICATIONS: ?Microvascular complications:  ? ?Denies: CKD, retinopathy,  ?Last eye exam: Completed 07/2021 ? ?Macrovascular complications:  ? ?Denies: CAD, PVD, CVA ? ? ?PAST HISTORY: ?Past Medical History:  ?Past Medical History:  ?Diagnosis Date  ? Arthritis   ? Dyslipidemia   ? Dyspnea   ? On exertion  ? Fatigue   ? NAUSEA,BLOATING  ? H. pylori infection 09/15/2006  ? Hematochezia 08/30/2007  ? History of nuclear stress test 04/03/2009  ? exercise myoview; normal pattern of perfusion; low risk scan   ? Hypertension   ? treated  ? NIDDM (non-insulin dependent diabetes mellitus) 2011  ? type 2  ? Schizoaffective disorder ([place])   ? Tobacco chew use   ?  Unspecified vitamin D deficiency   ? ?Past Surgical History:  ?Past Surgical History:  ?Procedure Laterality Date  ? APPENDECTOMY    ? HERNIA REPAIR    ? RIGHT  ? KNEE SURGERY    ? BOTH  ? TOTAL KNEE ARTHROPLASTY Right 10/30/2020  ? Procedure: TOTAL KNEE ARTHROPLASTY;  Surgeon: Gaynelle Arabian, MD;  Location: [facility];  Service: Orthopedics;  Laterality: Right;  74mn  ? TRANSTHORACIC ECHOCARDIOGRAM  04/03/2009  ? EF=>55%; trace TR, mild pulm regurg  ?  ?Social History:  reports that he has never smoked. He quit smokeless tobacco use about 13 months ago.  His smokeless tobacco use included snuff and chew. He reports that he does not drink alcohol and does not use drugs. ?Family History:  ?Family History  ?Problem Relation Age of Onset  ? Stroke Father 566 ? Pneumonia Father   ? Heart failure Mother 671 ? Coronary artery disease Mother   ?     also MI  ? Heart attack Mother   ? Heart attack Maternal Uncle   ?     died in 420s ? Cancer Other   ?     x2; maternal side  ? Diabetes Maternal Grandmother   ? ? ? ?HOME MEDICATIONS: ?Allergies as of 11/28/2021   ? ?   Reactions  ? Doxycycline  Other (See Comments)  ? Reaction:  Blisters   ? Gabapentin   ? Other reaction(s): Hallucinations, Lightheadedness  ? Methocarbamol   ? Other reaction(s): Confusion, Hallucinations  ? Lisinopril Swelling  ? Promethazine   ? Hallucinations  ? ?  ? ?  ?Medication List  ?  ? ?  ? Accurate as of November 28, 2021  4:35 PM. If you have any questions, ask your nurse or doctor.  ?  ?  ? ?  ? ?STOP taking these medications   ? ?acarbose 50 MG tablet ?Commonly known as: PRECOSE ?Stopped by: Dorita Sciara, MD ?  ?cyclobenzaprine 10 MG tablet ?Commonly known as: FLEXERIL ?Stopped by: Dorita Sciara, MD ?  ?glipiZIDE 10 MG tablet ?Commonly known as: GLUCOTROL ?Stopped by: Dorita Sciara, MD ?  ? ?  ? ?TAKE these medications   ? ?amLODipine 10 MG tablet ?Commonly known as: NORVASC ?Take 1 tablet (10 mg total) by mouth daily. For high blood  pressure ?  ?atorvastatin 20 MG tablet ?Commonly known as: LIPITOR ?Take 1 tablet (20 mg total) by mouth daily. For high cholesterol ?What changed: when to take this ?  ?carbamazepine 200 MG tablet ?Commonly known as: TEGRETOL ?Take 200 mg by mouth 2 (two) times daily. ?  ?Dexcom G6 Sensor Misc ?1 Device by Does not apply route as directed. ?Started by: Dorita Sciara, MD ?  ?Dexcom G6 Transmitter Misc ?1 Device by Does not apply route as directed. ?Started by: Dorita Sciara, MD ?  ?Excedrin Extra Strength 250-250-65 MG tablet ?Generic drug: aspirin-acetaminophen-caffeine ?Take 1 tablet by mouth in the morning and at bedtime. ?  ?FLUoxetine 20 MG capsule ?Commonly known as: PROZAC ?Take 60 mg by mouth every morning. ?  ?furosemide 20 MG tablet ?Commonly known as: LASIX ?Take 20 mg by mouth daily. ?  ?hydrochlorothiazide 25 MG tablet ?Commonly known as: HYDRODIURIL ?Take 25 mg by mouth every evening. ?  ?indomethacin 25 MG capsule ?Commonly known as: INDOCIN ?Take 25 mg by mouth daily as needed for mild pain or moderate pain. ?  ?insulin glargine 100 UNIT/ML injection ?Commonly known as: Lantus ?Inject 0.16 mLs (16 Units total) into the skin daily. ?Started by: Dorita Sciara, MD ?  ?Insulin Syringes (Disposable) U-100 0.3 ML Misc ?1 Device by Does not apply route daily in the afternoon. ?Started by: Dorita Sciara, MD ?  ?Klor-Con M20 20 MEQ tablet ?Generic drug: potassium chloride SA ?Take 20 mEq by mouth at bedtime. ?  ?losartan 100 MG tablet ?Commonly known as: COZAAR ?Take 100 mg by mouth daily. ?  ?metFORMIN 1000 MG tablet ?Commonly known as: GLUCOPHAGE ?Take 1 tablet (1,000 mg total) by mouth 2 (two) times daily with a meal. ?  ?nitroGLYCERIN 0.4 MG SL tablet ?Commonly known as: NITROSTAT ?Place 1 tablet (0.4 mg total) under the tongue every 5 (five) minutes as needed for chest pain. ?  ?pantoprazole 40 MG tablet ?Commonly known as: PROTONIX ?Take 1 tablet (40 mg total) by mouth daily.  For acid reflux ?What changed: when to take this ?  ?pramipexole 1.5 MG tablet ?Commonly known as: MIRAPEX ?Take 1.5 mg by mouth at bedtime. ?  ?tamsulosin 0.4 MG Caps capsule ?Commonly known as: FLOMAX ?Take 1 capsule (0.4 mg total) by mouth daily after breakfast. For prostate health ?  ?traZODone 100 MG tablet ?Commonly known as: DESYREL ?Take 1 tablet (100 mg total) by mouth at bedtime. For sleep ?  ?Trulicity 2.53 GU/4.4IH Sopn ?Generic drug: Dulaglutide ?  Inject 0.75 mg into the skin once a week. ?Started by: Dorita Sciara, MD ?  ? ?  ? ? ? ?ALLERGIES: ?Allergies  ?Allergen Reactions  ? Doxycycline Other (See Comments)  ?  Reaction:  Blisters   ? Gabapentin   ?  Other reaction(s): Hallucinations, Lightheadedness  ? Methocarbamol   ?  Other reaction(s): Confusion, Hallucinations  ? Lisinopril Swelling  ? Promethazine   ?  Hallucinations  ? ? ? ?REVIEW OF SYSTEMS: ?A comprehensive ROS was conducted with the patient and is negative except as per HPI and below:  ?Review of Systems  ?Gastrointestinal:  Positive for nausea. Negative for abdominal pain and diarrhea.  ?Musculoskeletal:  Positive for joint pain.  ?Neurological:  Positive for tingling.  ? ?  ?OBJECTIVE:  ? ?VITAL SIGNS: BP 110/68 (BP Location: Left Arm, Patient Position: Sitting, Cuff Size: Small)   Pulse 100   Ht '5\' 5"'$  (1.651 m)   Wt 192 lb (87.1 kg)   SpO2 96%   BMI 31.95 kg/m?   ? ?PHYSICAL EXAM:  ?General: Pt appears well and is in NAD  ?Neck: General: Supple without adenopathy or carotid bruits. ?Thyroid: Thyroid size normal.  No goiter or nodules appreciated.  ?Lungs: Clear with good BS bilat with no rales, rhonchi, or wheezes  ?Heart: RRR with normal S1 and S2 and no gallops; no murmurs; no rub  ?Abdomen: Normoactive bowel sounds, soft, nontender, without masses or organomegaly palpable  ?Extremities:  ?Lower extremities - No pretibial edema. No lesions.  ?Neuro: MS is good with appropriate affect, pt is alert and Ox3  ? ? ?DM foot  exam: 11/28/2021 ? ?The skin of the feet is intact without sores or ulcerations. ?The pedal pulses are 2+ on right and 2+ on left. ?The sensation is decreased to a screening 5.07, 10 gram monofilament bilaterally ? ? ?D

## 2021-11-28 NOTE — Patient Instructions (Addendum)
-   Stop Glipizide  ?- Stop Acarbose  ?- Start Lantus 16 units ONCE daily  ?- Start Trulicity 7.01 mg once weekly  ?- Continue Metformin 1000 mg Twice daily  ? ? ? ? ?HOW TO TREAT LOW BLOOD SUGARS (Blood sugar LESS THAN 70 MG/DL) ?Please follow the RULE OF 15 for the treatment of hypoglycemia treatment (when your (blood sugars are less than 70 mg/dL)  ? ?STEP 1: Take 15 grams of carbohydrates when your blood sugar is low, which includes:  ?3-4 GLUCOSE TABS  OR ?3-4 OZ OF JUICE OR REGULAR SODA OR ?ONE TUBE OF GLUCOSE GEL   ? ?STEP 2: RECHECK blood sugar in 15 MINUTES ?STEP 3: If your blood sugar is still low at the 15 minute recheck --> then, go back to STEP 1 and treat AGAIN with another 15 grams of carbohydrates. ? ? ?

## 2021-11-28 NOTE — Telephone Encounter (Signed)
PA not needed. Ran test bill, pt insurance will only allow #31 days supply. For the patient to get pens his insurance will only allow two pens, which mean the pharmacy would have to break open the box of five. It would be difficult for them to dispense the other three pens cause unneeded inventory. Can not definitively say this is law but more of a pharmacy preference. In order for this to be paid by insurance, it would need to be changed to vials. One vial will can be billed as 31 day supply. Prior authorizations can not override a plan limitation.

## 2021-11-29 ENCOUNTER — Other Ambulatory Visit (HOSPITAL_COMMUNITY): Payer: Self-pay

## 2021-11-29 ENCOUNTER — Telehealth: Payer: Self-pay | Admitting: Pharmacy Technician

## 2021-11-29 NOTE — Telephone Encounter (Signed)
Patient Advocate Encounter ? ?Received notification from [place] that prior authorization for [place] is required. ?  ?PA submitted on 4.6.23 ?SENSOR Key [id number]  ?TRANSMITTER Key [id number]  ?Status is pending ?  ?[facility] will continue to follow ? ?Sharrie Self R Dorna Mallet, CPhT ?Patient Advocate ?[facility] ?Phone: [phone] ?Fax:  [phone] ? ?

## 2021-12-03 ENCOUNTER — Telehealth: Payer: Self-pay

## 2021-12-03 NOTE — Telephone Encounter (Signed)
Per [employer] for the [employer] has been denied because patient doesn't test sugar 4 times a day or inject insulin more than 3 times daily.  ?

## 2021-12-21 ENCOUNTER — Other Ambulatory Visit: Payer: Self-pay | Admitting: Internal Medicine

## 2021-12-21 ENCOUNTER — Telehealth: Payer: Self-pay

## 2021-12-21 MED ORDER — ACCU-CHEK GUIDE VI STRP: 1.0000 | ORAL_STRIP | Freq: Every day | 12 refills | Status: DC

## 2021-12-21 MED ORDER — ACCU-CHEK GUIDE W/DEVICE KIT: 1.0000 | PACK | Freq: Every day | 0 refills | Status: DC

## 2021-12-21 MED ORDER — INSULIN GLARGINE 100 UNIT/ML ~~LOC~~ SOLN: 25.0000 [IU] | Freq: Every day | SUBCUTANEOUS | 11 refills | Status: DC

## 2021-12-21 NOTE — Telephone Encounter (Signed)
Patient notified of changes and verbalized understanding  ?

## 2021-12-21 NOTE — Telephone Encounter (Addendum)
Patient sugars have been running between 300 and 350 in the morning, Patient has been taking the medication as prescribed. Also insurance wouldn't approve Dexcom and he would like a meter sent to pharmacy .  ?

## 2021-12-25 ENCOUNTER — Other Ambulatory Visit (HOSPITAL_COMMUNITY): Payer: Self-pay

## 2021-12-25 NOTE — Telephone Encounter (Signed)
Received a fax regarding Prior Authorization from [facility] for [place]. Authorization has been DENIED because .  ?Dexcom G6 Sensor is covered only if: ?All of the following: ?(1) You are on an intensive insulin regimen (three or more insulin injections per day or use a continuous ?subcutaneous insulin infusion pump). ?(2) You regularly monitor your blood glucose four or more times per day. ?The information provided does not show that you meet the criteria listed above.. ? ? ? ?

## 2022-01-28 ENCOUNTER — Encounter: Payer: Self-pay | Admitting: Internal Medicine

## 2022-01-29 ENCOUNTER — Encounter: Payer: Self-pay | Admitting: Internal Medicine

## 2022-01-29 ENCOUNTER — Telehealth: Payer: Self-pay | Admitting: Pharmacy Technician

## 2022-01-29 NOTE — Telephone Encounter (Signed)
Patient Advocate Encounter   Received notification from [employer] that prior authorization for Dexcom is required by his/her insurance [employer].  Key [id number]  Per previous note, pt still doesn't meet ins [place] requirements. Archived in [facility]

## 2022-02-05 ENCOUNTER — Encounter: Payer: Self-pay | Admitting: Internal Medicine

## 2022-02-07 ENCOUNTER — Other Ambulatory Visit: Payer: Self-pay

## 2022-02-07 ENCOUNTER — Encounter: Payer: Self-pay | Admitting: Internal Medicine

## 2022-03-11 ENCOUNTER — Encounter (HOSPITAL_COMMUNITY): Payer: Self-pay | Admitting: *Deleted

## 2022-03-11 ENCOUNTER — Emergency Department (HOSPITAL_COMMUNITY): Payer: 59

## 2022-03-11 ENCOUNTER — Other Ambulatory Visit: Payer: Self-pay

## 2022-03-11 ENCOUNTER — Emergency Department (HOSPITAL_COMMUNITY)
Admission: EM | Admit: 2022-03-11 | Discharge: 2022-03-11 | Disposition: A | Discharge status: Home / Self Care | Payer: 59 | Attending: Emergency Medicine | Admitting: Emergency Medicine

## 2022-03-11 DIAGNOSIS — E1165 Type 2 diabetes mellitus with hyperglycemia: Secondary | ICD-10-CM | POA: Diagnosis not present

## 2022-03-11 DIAGNOSIS — D649 Anemia, unspecified: Secondary | ICD-10-CM | POA: Diagnosis not present

## 2022-03-11 DIAGNOSIS — Z7984 Long term (current) use of oral hypoglycemic drugs: Secondary | ICD-10-CM | POA: Insufficient documentation

## 2022-03-11 DIAGNOSIS — R140 Abdominal distension (gaseous): Secondary | ICD-10-CM | POA: Diagnosis not present

## 2022-03-11 DIAGNOSIS — Z794 Long term (current) use of insulin: Secondary | ICD-10-CM | POA: Insufficient documentation

## 2022-03-11 DIAGNOSIS — R1031 Right lower quadrant pain: Secondary | ICD-10-CM | POA: Insufficient documentation

## 2022-03-11 DIAGNOSIS — Z79899 Other long term (current) drug therapy: Secondary | ICD-10-CM | POA: Diagnosis not present

## 2022-03-11 DIAGNOSIS — Z7982 Long term (current) use of aspirin: Secondary | ICD-10-CM | POA: Insufficient documentation

## 2022-03-11 DIAGNOSIS — R000 Tachycardia, unspecified: Secondary | ICD-10-CM | POA: Diagnosis not present

## 2022-03-11 DIAGNOSIS — I10 Essential (primary) hypertension: Secondary | ICD-10-CM | POA: Insufficient documentation

## 2022-03-11 HISTORY — DX: Calculus of kidney: N20.0

## 2022-03-11 LAB — COMPREHENSIVE METABOLIC PANEL
ALT: 28 U/L (ref 0–44)
AST: 22 U/L (ref 15–41)
Albumin: 3.7 g/dL (ref 3.5–5.0)
Alkaline Phosphatase: 62 U/L (ref 38–126)
Anion gap: 8 (ref 5–15)
BUN: 22 mg/dL (ref 8–23)
CO2: 24 mmol/L (ref 22–32)
Calcium: 8.9 mg/dL (ref 8.9–10.3)
Chloride: 105 mmol/L (ref 98–111)
Creatinine, Ser: 1.02 mg/dL (ref 0.61–1.24)
GFR, Estimated: >60 mL/min (ref >60)
Glucose, Bld: 229 mg/dL — ABNORMAL HIGH (ref 70–99)
Potassium: 3.9 mmol/L (ref 3.5–5.1)
Sodium: 137 mmol/L (ref 135–145)
Total Bilirubin: 0.4 mg/dL (ref 0.3–1.2)
Total Protein: 6.9 g/dL (ref 6.5–8.1)

## 2022-03-11 LAB — CBC WITH DIFFERENTIAL/PLATELET
Abs Immature Granulocytes: 0.05 10*3/uL (ref 0.00–0.07)
Basophils Absolute: 0.1 10*3/uL (ref 0.0–0.1)
Basophils Relative: 1 %
Eosinophils Absolute: 0.5 10*3/uL (ref 0.0–0.5)
Eosinophils Relative: 6 %
HCT: 38 % — ABNORMAL LOW (ref 39.0–52.0)
Hemoglobin: 12.5 g/dL — ABNORMAL LOW (ref 13.0–17.0)
Immature Granulocytes: 1 %
Lymphocytes Relative: 30 %
Lymphs Abs: 2.7 10*3/uL (ref 0.7–4.0)
MCH: 28.5 pg (ref 26.0–34.0)
MCHC: 32.9 g/dL (ref 30.0–36.0)
MCV: 86.6 fL (ref 80.0–100.0)
Monocytes Absolute: 0.7 10*3/uL (ref 0.1–1.0)
Monocytes Relative: 8 %
Neutro Abs: 5 10*3/uL (ref 1.7–7.7)
Neutrophils Relative %: 54 %
Platelets: 189 10*3/uL (ref 150–400)
RBC: 4.39 MIL/uL (ref 4.22–5.81)
RDW: 13.2 % (ref 11.5–15.5)
WBC: 9 10*3/uL (ref 4.0–10.5)
nRBC: 0 % (ref 0.0–0.2)

## 2022-03-11 LAB — URINALYSIS, ROUTINE W REFLEX MICROSCOPIC
Bilirubin Urine: NEGATIVE
Glucose, UA: >=500 mg/dL — AB
Hgb urine dipstick: NEGATIVE
Ketones, ur: 5 mg/dL — AB
Leukocytes,Ua: NEGATIVE
Nitrite: NEGATIVE
Protein, ur: NEGATIVE mg/dL
Specific Gravity, Urine: 1.025 (ref 1.005–1.030)
pH: 5 (ref 5.0–8.0)

## 2022-03-11 LAB — LIPASE, BLOOD: Lipase: 50 U/L (ref 11–51)

## 2022-03-11 MED ORDER — IOHEXOL 300 MG/ML  SOLN
100.0000 mL | Freq: Once | INTRAMUSCULAR | Status: AC | PRN
  Administered 2022-03-11: 100 mL via INTRAVENOUS

## 2022-03-11 NOTE — Discharge Instructions (Addendum)
You were seen in the emergency department today for abdominal pain.  While you were here we did lab work and a CAT scan which were normal.  I am referring you to gastroenterology for your abdominal swelling.  If they do not call you in the next few days please call [facility] gastroenterology at [phone].

## 2022-03-11 NOTE — ED Triage Notes (Signed)
Pt with right flank pain for a week.  Pt seen Dr. Hilma Favors today and was sent here.  Pt's abd hard and distended. + nausea for couple days. Hx of kidney stones.

## 2022-03-11 NOTE — ED Provider Notes (Signed)
[facility] EMERGENCY DEPARTMENT Provider Note   CSN: [id number] Arrival date & time: 03/11/22  1534     History  Chief Complaint  Patient presents with   Flank Pain    Travis [name] is a 61 y.o. male. With past medical history of T2DM, hypertension, dyslipidemia, schizoaffective disorder who presents to the emergency department with flank pain.  States that he is having right lower quadrant and right flank pain for the past 2 to 3 weeks.  States that the pain initially started in the right lower quadrant but he is now intermittently having sharp pain in his right flank.  He states that since Friday he has had nausea and intermittent vomiting.  He denies any fevers or diarrhea.  Additionally he is having abdominal bloating and distention.  He states that this has been a problem intermittently for "a long time" but significantly worsening of recent.  He states that he is now getting short of breath from his abdominal distention.   Flank Pain Associated symptoms include abdominal pain and shortness of breath.       Home Medications Prior to Admission medications   Medication Sig Start Date End Date Taking? Authorizing Provider  Blood Glucose Monitoring Suppl (ACCU-CHEK GUIDE) w/Device KIT 1 Device by Does not apply route daily in the afternoon. 12/21/21   Shamleffer, Melanie Crazier, MD  glucose blood (ACCU-CHEK GUIDE) test strip 1 each by Other route daily. Use as instructed 12/21/21   Shamleffer, Melanie Crazier, MD  amLODipine (NORVASC) 10 MG tablet Take 1 tablet (10 mg total) by mouth daily. For high blood pressure 10/15/18   Nwoko, Herbert Pun I, NP  aspirin-acetaminophen-caffeine (EXCEDRIN EXTRA STRENGTH) [phone] MG tablet Take 1 tablet by mouth in the morning and at bedtime.    [provider]  atorvastatin (LIPITOR) 20 MG tablet Take 1 tablet (20 mg total) by mouth daily. For high cholesterol Patient taking differently: Take 20 mg by mouth every evening. For high  cholesterol 10/15/18   Lindell Spar I, NP  carbamazepine (TEGRETOL) 200 MG tablet Take 200 mg by mouth 2 (two) times daily. 09/28/20   [provider]  Continuous Blood Gluc Sensor (DEXCOM G6 SENSOR) MISC 1 Device by Does not apply route as directed. 11/28/21   Shamleffer, Melanie Crazier, MD  Continuous Blood Gluc Transmit (DEXCOM G6 TRANSMITTER) MISC 1 Device by Does not apply route as directed. 11/28/21   Shamleffer, Melanie Crazier, MD  Dulaglutide (TRULICITY) 7.51 ZG/0.1VC SOPN Inject 0.75 mg into the skin once a week. 11/28/21   Shamleffer, Melanie Crazier, MD  FLUoxetine (PROZAC) 20 MG capsule Take 60 mg by mouth every morning. 10/05/20   [provider]  furosemide (LASIX) 20 MG tablet Take 20 mg by mouth daily. 10/12/20   [provider]  hydrochlorothiazide (HYDRODIURIL) 25 MG tablet Take 25 mg by mouth every evening. 10/16/21   [provider]  indomethacin (INDOCIN) 25 MG capsule Take 25 mg by mouth daily as needed for mild pain or moderate pain. Patient not taking: Reported on 11/28/2021    [provider]  insulin glargine (LANTUS) 100 UNIT/ML injection Inject 0.25 mLs (25 Units total) into the skin daily. 12/21/21   Shamleffer, Melanie Crazier, MD  Insulin Syringes, Disposable, U-100 0.3 ML MISC 1 Device by Does not apply route daily in the afternoon. 11/28/21   Shamleffer, Melanie Crazier, MD  KLOR-CON M20 20 MEQ tablet Take 20 mEq by mouth at bedtime. 09/28/20   [provider]  losartan (COZAAR) 100  MG tablet Take 100 mg by mouth daily. 10/17/21   [provider]  metFORMIN (GLUCOPHAGE) 1000 MG tablet Take 1 tablet (1,000 mg total) by mouth 2 (two) times daily with a meal. 11/28/21   Shamleffer, Melanie Crazier, MD  nitroGLYCERIN (NITROSTAT) 0.4 MG SL tablet Place 1 tablet (0.4 mg total) under the tongue every 5 (five) minutes as needed for chest pain. 10/15/18   Lindell Spar I, NP  pantoprazole (PROTONIX) 40 MG tablet Take 1 tablet (40 mg  total) by mouth daily. For acid reflux Patient taking differently: Take 40 mg by mouth at bedtime. For acid reflux 10/15/18   Lindell Spar I, NP  pramipexole (MIRAPEX) 1.5 MG tablet Take 1.5 mg by mouth at bedtime.    [provider]  tamsulosin (FLOMAX) 0.4 MG CAPS capsule Take 1 capsule (0.4 mg total) by mouth daily after breakfast. For prostate health 10/15/18   Lindell Spar I, NP  traZODone (DESYREL) 100 MG tablet Take 1 tablet (100 mg total) by mouth at bedtime. For sleep 10/15/18   Lindell Spar I, NP      Allergies    Doxycycline, Gabapentin, Methocarbamol, Lisinopril, Lyrica [pregabalin], and Promethazine    Review of Systems   Review of Systems  Constitutional:  Negative for fever.  Respiratory:  Positive for shortness of breath.   Gastrointestinal:  Positive for abdominal distention, abdominal pain, nausea and vomiting. Negative for diarrhea.  Genitourinary:  Positive for flank pain.  All other systems reviewed and are negative.   Physical Exam Updated Vital Signs BP (!) 152/97   Pulse (!) 104   Temp 98.1 F (36.7 C) (Oral)   Resp 18   Ht 5' 5"  (1.651 m)   Wt 89 kg   SpO2 96%   BMI 32.63 kg/m  Physical Exam Vitals and nursing note reviewed.  Constitutional:      General: He is not in acute distress.    Appearance: Normal appearance. He is ill-appearing.  HENT:     Head: Normocephalic.  Eyes:     General: No scleral icterus.    Extraocular Movements: Extraocular movements intact.     Pupils: Pupils are equal, round, and reactive to light.  Cardiovascular:     Rate and Rhythm: Regular rhythm. Tachycardia present.     Pulses: Normal pulses.     Heart sounds: No murmur heard. Pulmonary:     Effort: Pulmonary effort is normal. No respiratory distress.     Breath sounds: Normal breath sounds.  Abdominal:     General: Bowel sounds are normal. There is distension.     Palpations: Abdomen is soft.     Tenderness: There is abdominal tenderness. There is right  CVA tenderness.  Musculoskeletal:        General: Normal range of motion.  Skin:    General: Skin is warm and dry.     Capillary Refill: Capillary refill takes less than 2 seconds.     Coloration: Skin is not jaundiced.  Neurological:     General: No focal deficit present.     Mental Status: He is alert and oriented to person, place, and time. Mental status is at baseline.  Psychiatric:        Mood and Affect: Mood normal.        Behavior: Behavior normal.        Thought Content: Thought content normal.        Judgment: Judgment normal.     ED Results / Procedures / Treatments  Labs (all labs ordered are listed, but only abnormal results are displayed) Labs Reviewed  COMPREHENSIVE METABOLIC PANEL - Abnormal; Notable for the following components:      Result Value   Glucose, Bld 229 (*)    All other components within normal limits  CBC WITH DIFFERENTIAL/PLATELET - Abnormal; Notable for the following components:   Hemoglobin 12.5 (*)    HCT 38.0 (*)    All other components within normal limits  URINALYSIS, ROUTINE W REFLEX MICROSCOPIC - Abnormal; Notable for the following components:   Glucose, UA >=500 (*)    Ketones, ur 5 (*)    Bacteria, UA RARE (*)    All other components within normal limits  LIPASE, BLOOD   EKG None  Radiology CT Abdomen Pelvis W Contrast  Result Date: 03/11/2022 CLINICAL DATA:  Nausea and vomiting. Patient reports right flank pain. Abdominal distension. EXAM: CT ABDOMEN AND PELVIS WITH CONTRAST TECHNIQUE: Multidetector CT imaging of the abdomen and pelvis was performed using the standard protocol following bolus administration of intravenous contrast. RADIATION DOSE REDUCTION: This exam was performed according to the departmental dose-optimization program which includes automated exposure control, adjustment of the mA and/or kV according to patient size and/or use of iterative reconstruction technique. CONTRAST:  156m OMNIPAQUE IOHEXOL 300 MG/ML   SOLN COMPARISON:  Noncontrast CT 08/06/2018 FINDINGS: Lower chest: No acute basilar consolidation, no pleural effusion. The heart is normal in size. Hepatobiliary: The liver is enlarged spanning 25.9 cm cranial caudal with diffuse steatosis. No focal hepatic lesion. Gallbladder physiologically distended, no calcified stone. No biliary dilatation. Pancreas: No ductal dilatation or inflammation. Spleen: Normal in size without focal abnormality. Adrenals/Urinary Tract: Normal adrenal glands. No hydronephrosis. No renal calculi. Homogeneous enhancement with symmetric excretion on delayed phase imaging. 14 mm cyst in the lower right kidney is well as scattered low-density lesions are too small to accurately characterize. No dedicated follow-up is needed. No suspicious or solid lesion. Ureters are decompressed. Unremarkable urinary bladder. Stomach/Bowel: Decompressed stomach. There is fecalization of distal small bowel contents, no obstruction or bowel inflammation. The appendix is not confidently visualized. No appendicitis. Small to moderate volume of colonic stool. No colonic wall thickening or inflammatory change. Vascular/Lymphatic: Aortic atherosclerosis without aneurysm. Patent portal and splenic veins. No abdominopelvic adenopathy. Reproductive: Prostate is unremarkable. Other: No free air or ascites. Small fat containing left inguinal hernia. Musculoskeletal: Scattered bone islands in the pelvis. There are no acute or suspicious osseous abnormalities. IMPRESSION: 1. No acute abnormality in the abdomen/pelvis. 2. Fecalization of distal small bowel contents suggests slow transit. No bowel obstruction. 3. Hepatomegaly and hepatic steatosis. Aortic Atherosclerosis (ICD10-I70.0). Electronically Signed   By: MKeith RakeM.D.   On: 03/11/2022 19:06    Procedures Procedures   Medications Ordered in ED Medications  iohexol (OMNIPAQUE) 300 MG/ML solution 100 mL (100 mLs Intravenous Contrast Given 03/11/22 1853)     ED Course/ Medical Decision Making/ A&P                           Medical Decision Making Amount and/or Complexity of Data Reviewed Labs: ordered. Radiology: ordered.  Risk Prescription drug management.  This patient presents to the ED for concern of abdominal pain and distention, this involves an extensive number of treatment options, and is a complaint that carries with it a high risk of complications and morbidity.  The differential diagnosis includes Acute hepatobiliary disease, pancreatitis, appendicitis, PUD, gastritis, SBO, diverticulitis, colitis, viral gastroenteritis, Crohn's, UC,  mass, UTI, pyelonephritis, nephrolithiasis, obstructed stone, infected stone, etc   Co morbidities that complicate the patient evaluation T2DM, HTN   Additional history obtained:  Additional history obtained from: wife at bedside  External records from outside source obtained and reviewed including: most recent endocrinology and ED physician note   Lab Results: I personally ordered, reviewed, and interpreted labs. Pertinent results include: CBC with mild anemia to 12.5 CMP with slightly elevated glucose to 229, otherwise negative Lipase 50, negative UA with glucosuria, otherwise negative  Imaging Studies ordered:  I ordered imaging studies which included CT.  I independently reviewed & interpreted imaging & am in agreement with radiology impression. Imaging shows: CT Abdomen Pelvis with contrast negative  Medications  -I reviewed the patient's home medications and did not make adjustments. -I did not prescribe new home medications.  Tests Considered: No further testing at this time  Critical Interventions: N/A  Consultations: N/A  SDH None identified  ED Course:  61 year old male who presents to the emergency department with lower abdominal pain.  He also endorsed having pain in his right flank. His physical exam was concerning for a protuberant abdomen which she states is  larger than his usual.  He states that this is intermittently happened over the past few months but never to this extent.  He feels bloated and taut.  Given his abnormal abdominal swelling, obtain CT abdomen pelvis with contrast to evaluate his right lower quadrant and flank pain as well as new abdominal swelling. The CT scan was negative for any abnormalities. Additionally his labs are unremarkable.  There is no evidence of acute hepatobiliary disease such as cholecystitis or cholangitis, hepatitis, pyelonephritis, vascular catastrophe, bowel obstruction or viscus perforation.  His UA is without UTI.  There is no evidence of the stone on his imaging.  Lipase is negative, doubt pancreatitis.  Unclear etiology of his right lower quadrant/groin pain.  He does not have any GU symptoms concerning for torsion.  Do not feel that he needs ultrasound for this.  He may have a hernia that is starting.  This was not seen on CT scan but similar positioning.  He may also have passed a stone.  There is no blood in his urine or evidence of stone on scan but would explain the right flank and groin pain.  Given the abdominal swelling I have referred him to gastroenterology.  His CT scan did not show any masses, lymphadenopathy, ascites.  Unclear etiology of his symptoms and so I feel that this needs further work-up.  He is agreeable to this plan.  He is given return precautions for any worsening symptoms.  Understanding.  Feel that he is safe for discharge at this time.  After consideration of the diagnostic results and the patients response to treatment, I feel that the patent would benefit from discharge. The patient has been appropriately medically screened and/or stabilized in the ED. I have low suspicion for any other emergent medical condition which would require further screening, evaluation or treatment in the ED or require inpatient management. The patient is overall well appearing and non-toxic in appearance. They  are hemodynamically stable at time of discharge.   Final Clinical Impression(s) / ED Diagnoses Final diagnoses:  Right lower quadrant abdominal pain    Rx / DC Orders ED Discharge Orders          Ordered    Ambulatory referral to Gastroenterology        03/11/22 1951  Mickie Hillier, PA-C 03/12/22 1649    Fredia Sorrow, MD 03/15/22 [phone]

## 2022-03-15 ENCOUNTER — Encounter: Payer: Self-pay | Admitting: Internal Medicine

## 2022-03-15 ENCOUNTER — Ambulatory Visit: Payer: Commercial Managed Care - PPO | Admitting: Internal Medicine

## 2022-03-15 VITALS — BP 118/74 | HR 102 | Ht 65.0 in | Wt 194.4 lb

## 2022-03-15 DIAGNOSIS — E1142 Type 2 diabetes mellitus with diabetic polyneuropathy: Secondary | ICD-10-CM

## 2022-03-15 DIAGNOSIS — E1165 Type 2 diabetes mellitus with hyperglycemia: Secondary | ICD-10-CM | POA: Diagnosis not present

## 2022-03-15 DIAGNOSIS — Z794 Long term (current) use of insulin: Secondary | ICD-10-CM | POA: Diagnosis not present

## 2022-03-15 LAB — POCT GLYCOSYLATED HEMOGLOBIN (HGB A1C): Hemoglobin A1C: 11 % — AB (ref 4.0–5.6)

## 2022-03-15 LAB — POCT GLUCOSE (DEVICE FOR HOME USE): POC Glucose: 227 mg/dl — AB (ref 70–99)

## 2022-03-15 MED ORDER — LANTUS SOLOSTAR 100 UNIT/ML ~~LOC~~ SOPN: 45.0000 [IU] | PEN_INJECTOR | Freq: Every day | SUBCUTANEOUS | 3 refills | Status: DC

## 2022-03-15 MED ORDER — GLIPIZIDE 5 MG PO TABS: 5.0000 mg | ORAL_TABLET | Freq: Two times a day (BID) | ORAL | 1 refills | Status: DC

## 2022-03-15 MED ORDER — FREESTYLE LIBRE 3 SENSOR MISC: 1.0000 | 3 refills | Status: DC

## 2022-03-15 MED ORDER — EMPAGLIFLOZIN 10 MG PO TABS: 10.0000 mg | ORAL_TABLET | Freq: Every day | ORAL | 3 refills | Status: DC

## 2022-03-15 NOTE — Patient Instructions (Addendum)
-   Increase  Lantus 45 units ONCE daily  - Start Jardiance 10 mg daily  - Increase Glipizide 5 mg , 1 tablet before Breakfast and 1 tablet before supper - Continue Metformin 1000 mg Twice daily      HOW TO TREAT LOW BLOOD SUGARS (Blood sugar LESS THAN 70 MG/DL) Please follow the RULE OF 15 for the treatment of hypoglycemia treatment (when your (blood sugars are less than 70 mg/dL)   STEP 1: Take 15 grams of carbohydrates when your blood sugar is low, which includes:  3-4 GLUCOSE TABS  OR 3-4 OZ OF JUICE OR REGULAR SODA OR ONE TUBE OF GLUCOSE GEL    STEP 2: RECHECK blood sugar in 15 MINUTES STEP 3: If your blood sugar is still low at the 15 minute recheck --> then, go back to STEP 1 and treat AGAIN with another 15 grams of carbohydrates.

## 2022-03-15 NOTE — Progress Notes (Signed)
Name: Travis [name]  MRN/ DOB: [id number], December 09, 1960   Age/ Sex: 61 y.o., male    PCP: Redmond School, MD   Reason for Endocrinology Evaluation: Type 2 Diabetes Mellitus     Date of Initial Endocrinology Visit: 11/28/2021    PATIENT IDENTIFIER: Travis [name] is a 61 y.o. male with a past medical history of T2DM, HTN, and dyslipidemia. The patient presented for initial endocrinology clinic visit on 11/28/2021 for consultative assistance with his diabetes management.    HPI: Travis [name] is accompanied bu his wife who is also a nurse   Diagnosed with DM in many years ago          Hemoglobin A1c has ranged from 6.3% in 2018, peaking at 10.6% in 2012.   On his initial visit to our clinic his A1c was 10.4%, he was on glipizide, a carbose, and metformin.  We continued metformin, started Lantus, Trulicity, and stopped acarbose   We had to restart glipizide June 2023 due to hyperglycemia  Trulicity 3.38 mg caused nausea and vomiting May 2023  SUBJECTIVE:   During the last visit (11/28/2021): A1c 10.4% stopped acarbose, continue metformin, glipizide, started Lantus and Trulicity  Today (25/05/39): Travis [name] is here for follow-up on diabetes management.  He checks his blood sugars occasionally .  The patient did not bring his meter today.  The patient has not had hypoglycemic episodes since the last clinic visit, which typically    Patient presented to the ED with flank pain on 03/11/2022, CT abdomen showed no acute abnormality  He continues with polydipsia   He has chronic nausea but no vomiting or diarrhea     HOME DIABETES REGIMEN: Metformin 1000 mg BID  Glipizide 5 mg daily  Lantus 35 units daily      Statin: Yes ACE-I/ARB: Yes    METER DOWNLOAD SUMMARY: unable to download 179- 338 mg/dL    DIABETIC COMPLICATIONS: Microvascular complications:   Denies: CKD, retinopathy,  Last eye exam: Completed 07/2021  Macrovascular complications:   Denies: CAD,  PVD, CVA   PAST HISTORY: Past Medical History:  Past Medical History:  Diagnosis Date   Arthritis    Dyslipidemia    Dyspnea    On exertion   Fatigue    NAUSEA,BLOATING   H. pylori infection 09/15/2006   Hematochezia 08/30/2007   History of nuclear stress test 04/03/2009   exercise myoview; normal pattern of perfusion; low risk scan    Hypertension    treated   Kidney stones    NIDDM (non-insulin dependent diabetes mellitus) 2011   type 2   Schizoaffective disorder ([place])    Tobacco chew use    Unspecified vitamin D deficiency    Past Surgical History:  Past Surgical History:  Procedure Laterality Date   APPENDECTOMY     HERNIA REPAIR     RIGHT   KNEE SURGERY     BOTH   TOTAL KNEE ARTHROPLASTY Right 10/30/2020   Procedure: TOTAL KNEE ARTHROPLASTY;  Surgeon: Gaynelle Arabian, MD;  Location: [facility];  Service: Orthopedics;  Laterality: Right;  3mn   TRANSTHORACIC ECHOCARDIOGRAM  04/03/2009   EF=>55%; trace TR, mild pulm regurg    Social History:  reports that he has never smoked. He quit smokeless tobacco use about 16 months ago.  His smokeless tobacco use included snuff and chew. He reports that he does not drink alcohol and does not use drugs. Family History:  Family History  Problem Relation Age of Onset   Stroke  Father 40   Pneumonia Father    Heart failure Mother 76   Coronary artery disease Mother        also MI   Heart attack Mother    Heart attack Maternal Uncle        died in 39s   Cancer Other        x2; maternal side   Diabetes Maternal Grandmother      HOME MEDICATIONS: Allergies as of 03/15/2022       Reactions   Doxycycline Other (See Comments)   Reaction:  Blisters    Gabapentin    Other reaction(s): Hallucinations, Lightheadedness   Methocarbamol    Other reaction(s): Confusion, Hallucinations   Lisinopril Swelling   Lyrica [pregabalin]    Promethazine    Hallucinations        Medication List        Accurate as of March 15, 2022   2:18 PM. If you have any questions, ask your nurse or doctor.          STOP taking these medications    Dexcom G6 Sensor Misc Stopped by: Dorita Sciara, MD   Dexcom G6 Transmitter Misc Stopped by: Dorita Sciara, MD       TAKE these medications    Accu-Chek Guide test strip Generic drug: glucose blood 1 each by Other route daily. Use as instructed   Accu-Chek Guide w/Device Kit 1 Device by Does not apply route daily in the afternoon.   amLODipine 10 MG tablet Commonly known as: NORVASC Take 1 tablet (10 mg total) by mouth daily. For high blood pressure   atorvastatin 20 MG tablet Commonly known as: LIPITOR Take 1 tablet (20 mg total) by mouth daily. For high cholesterol What changed: when to take this   carbamazepine 200 MG tablet Commonly known as: TEGRETOL Take 200 mg by mouth 2 (two) times daily.   Excedrin Extra Strength 250-250-65 MG tablet Generic drug: aspirin-acetaminophen-caffeine Take 1 tablet by mouth in the morning and at bedtime.   FLUoxetine 20 MG capsule Commonly known as: PROZAC Take 60 mg by mouth every morning.   furosemide 20 MG tablet Commonly known as: LASIX Take 20 mg by mouth daily.   glipiZIDE 5 MG tablet Commonly known as: GLUCOTROL Take 5 mg by mouth daily before breakfast.   hydrochlorothiazide 25 MG tablet Commonly known as: HYDRODIURIL Take 25 mg by mouth every evening.   indomethacin 25 MG capsule Commonly known as: INDOCIN Take 25 mg by mouth daily as needed for mild pain or moderate pain.   insulin glargine 100 UNIT/ML injection Commonly known as: Lantus Inject 0.25 mLs (25 Units total) into the skin daily. What changed: how much to take   Insulin Syringes (Disposable) U-100 0.3 ML Misc 1 Device by Does not apply route daily in the afternoon.   Klor-Con M20 20 MEQ tablet Generic drug: potassium chloride SA Take 20 mEq by mouth at bedtime.   losartan 100 MG tablet Commonly known as: COZAAR Take  100 mg by mouth daily.   metFORMIN 1000 MG tablet Commonly known as: GLUCOPHAGE Take 1 tablet (1,000 mg total) by mouth 2 (two) times daily with a meal.   nitroGLYCERIN 0.4 MG SL tablet Commonly known as: NITROSTAT Place 1 tablet (0.4 mg total) under the tongue every 5 (five) minutes as needed for chest pain.   pantoprazole 40 MG tablet Commonly known as: PROTONIX Take 1 tablet (40 mg total) by mouth daily. For acid reflux What changed:  when to take this   pramipexole 1.5 MG tablet Commonly known as: MIRAPEX Take 1.5 mg by mouth at bedtime.   tamsulosin 0.4 MG Caps capsule Commonly known as: FLOMAX Take 1 capsule (0.4 mg total) by mouth daily after breakfast. For prostate health   traZODone 100 MG tablet Commonly known as: DESYREL Take 1 tablet (100 mg total) by mouth at bedtime. For sleep   Trulicity 1.61 WR/6.0AV Sopn Generic drug: Dulaglutide Inject 0.75 mg into the skin once a week.         ALLERGIES: Allergies  Allergen Reactions   Doxycycline Other (See Comments)    Reaction:  Blisters    Gabapentin     Other reaction(s): Hallucinations, Lightheadedness   Methocarbamol     Other reaction(s): Confusion, Hallucinations   Lisinopril Swelling   Lyrica [Pregabalin]    Promethazine     Hallucinations        OBJECTIVE:   VITAL SIGNS: BP 118/74 (BP Location: Left Arm, Patient Position: Sitting, Cuff Size: Small)   Pulse (!) 102   Ht 5' 5"  (1.651 m)   Wt 194 lb 6.4 oz (88.2 kg)   SpO2 95%   BMI 32.35 kg/m    PHYSICAL EXAM:  General: Pt appears well and is in NAD  Neck: General: Supple without adenopathy or carotid bruits. Thyroid: Thyroid size normal.  No goiter or nodules appreciated.  Lungs: Clear with good BS bilat with no rales, rhonchi, or wheezes  Heart: RRR with normal S1 and S2 and no gallops; no murmurs; no rub  Abdomen: Normoactive bowel sounds, soft, nontender, without masses or organomegaly palpable  Extremities:  Lower extremities - No  pretibial edema. No lesions.  Neuro: MS is good with appropriate affect, pt is alert and Ox3    DM foot exam: 11/28/2021  The skin of the feet is intact without sores or ulcerations. The pedal pulses are 2+ on right and 2+ on left. The sensation is decreased to a screening 5.07, 10 gram monofilament bilaterally   DATA REVIEWED:  Lab Results  Component Value Date   HGBA1C 11.0 (A) 03/15/2022   HGBA1C 10.4 (A) 11/28/2021   HGBA1C 7.6 (H) 10/20/2020   Lab Results  Component Value Date   LDLCALC 87 04/16/2017   CREATININE 1.02 03/11/2022   No results found for: "MICRALBCREAT"  Lab Results  Component Value Date   CHOL 152 04/16/2017   HDL 23 (L) 04/16/2017   LDLCALC 87 04/16/2017   TRIG 209 (H) 04/16/2017   CHOLHDL 6.6 04/16/2017        ASSESSMENT / PLAN / RECOMMENDATIONS:   1) Type 2 Diabetes Mellitus, poorly controlled, With neuropathic complications - Most recent A1c of 11.0%. Goal A1c <7.0%.    -Poorly controlled diabetes due to dietary indiscretion, unfortunately the patient does not believe that he can control his eating habits, he declined a referral to our RD -Intolerant to Trulicity 4.09 mg weekly due to GI side effects -Freestyle libre sent to the pharmacy -We discussed SGLT2 inhibitors, cautioned against genital infections -I will also increase his glipizide and insulin as below -We entertain the idea of prandial insulin, but they are concerned about insulin coverage through his insurance company, we discussed regular insulin as an option if insulin analogs are not covered by his insurance   MEDICATIONS:  -Increase Lantus 45 units daily - Start Jardiance 10 mg daily - Increase glipizide 5 mg twice daily - Continue metformin 1000 mg twice daily  EDUCATION / INSTRUCTIONS: BG monitoring  instructions: Patient is instructed to check his blood sugars 1 times a day, fasting. Call [facility] if: BG persistently < 70  I reviewed the Rule of 15 for  the treatment of hypoglycemia in detail with the patient. Literature supplied.   2) Diabetic complications:  Eye: Does not have known diabetic retinopathy.  Neuro/ Feet: Does  have known diabetic peripheral neuropathy. Renal: Patient does not have known baseline CKD. He is  on an ACEI/ARB at present.    Follow-up in 4 months    Signed electronically by: Mack Guise, MD  [facility]  [facility] [address] [place], [place] [address] Phone: [phone] FAX: [phone]   CC: [address], [place] [address] Phone: [phone]  Fax: [phone]    Return to Endocrinology clinic as below: No future appointments.

## 2022-03-18 ENCOUNTER — Other Ambulatory Visit (HOSPITAL_COMMUNITY): Payer: Self-pay

## 2022-03-19 ENCOUNTER — Other Ambulatory Visit: Payer: Self-pay

## 2022-03-19 MED ORDER — FREESTYLE LIBRE 3 SENSOR MISC: 1.0000 | 3 refills | Status: DC

## 2022-03-19 MED ORDER — LANTUS SOLOSTAR 100 UNIT/ML ~~LOC~~ SOPN: 45.0000 [IU] | PEN_INJECTOR | Freq: Every day | SUBCUTANEOUS | 3 refills | Status: DC

## 2022-04-22 ENCOUNTER — Other Ambulatory Visit (HOSPITAL_COMMUNITY): Payer: Self-pay | Admitting: Internal Medicine

## 2022-04-22 ENCOUNTER — Ambulatory Visit (HOSPITAL_COMMUNITY)
Admission: RE | Admit: 2022-04-22 | Discharge: 2022-04-22 | Disposition: A | Discharge status: Home / Self Care | Payer: Commercial Managed Care - PPO | Source: Ambulatory Visit | Attending: Internal Medicine | Admitting: Internal Medicine

## 2022-04-22 DIAGNOSIS — R1013 Epigastric pain: Secondary | ICD-10-CM | POA: Insufficient documentation

## 2022-04-29 ENCOUNTER — Encounter: Payer: Self-pay | Admitting: Internal Medicine

## 2022-04-30 ENCOUNTER — Other Ambulatory Visit: Payer: Self-pay

## 2022-04-30 MED ORDER — EMPAGLIFLOZIN 10 MG PO TABS: 10.0000 mg | ORAL_TABLET | Freq: Every day | ORAL | 0 refills | Status: DC

## 2022-05-06 ENCOUNTER — Other Ambulatory Visit (HOSPITAL_COMMUNITY): Payer: Self-pay

## 2022-07-05 ENCOUNTER — Other Ambulatory Visit (INDEPENDENT_AMBULATORY_CARE_PROVIDER_SITE_OTHER): Payer: Self-pay | Admitting: Otolaryngology

## 2022-07-25 ENCOUNTER — Encounter: Payer: Self-pay | Admitting: Internal Medicine

## 2022-07-25 ENCOUNTER — Ambulatory Visit: Payer: Commercial Managed Care - PPO | Admitting: Internal Medicine

## 2022-07-25 VITALS — BP 130/78 | HR 105 | Ht 65.0 in | Wt 195.0 lb

## 2022-07-25 DIAGNOSIS — E1142 Type 2 diabetes mellitus with diabetic polyneuropathy: Secondary | ICD-10-CM

## 2022-07-25 DIAGNOSIS — E1165 Type 2 diabetes mellitus with hyperglycemia: Secondary | ICD-10-CM

## 2022-07-25 DIAGNOSIS — Z794 Long term (current) use of insulin: Secondary | ICD-10-CM

## 2022-07-25 LAB — POCT GLYCOSYLATED HEMOGLOBIN (HGB A1C): Hemoglobin A1C: 8.2 % — AB (ref 4.0–5.6)

## 2022-07-25 MED ORDER — TOUJEO MAX SOLOSTAR 300 UNIT/ML ~~LOC~~ SOPN: 45.0000 [IU] | PEN_INJECTOR | Freq: Every day | SUBCUTANEOUS | 3 refills | Status: DC

## 2022-07-25 MED ORDER — EMPAGLIFLOZIN 25 MG PO TABS: 25.0000 mg | ORAL_TABLET | Freq: Every day | ORAL | 3 refills | Status: DC

## 2022-07-25 NOTE — Patient Instructions (Signed)
-   Decrease  Lantus/ Toujeo  45 units ONCE daily  - Increase Jardiance 25 mg daily  - Continue Glipizide 5 mg , 1 tablet before Breakfast and 1 tablet before supper - Continue Metformin 1000 mg Twice daily      HOW TO TREAT LOW BLOOD SUGARS (Blood sugar LESS THAN 70 MG/DL) Please follow the RULE OF 15 for the treatment of hypoglycemia treatment (when your (blood sugars are less than 70 mg/dL)   STEP 1: Take 15 grams of carbohydrates when your blood sugar is low, which includes:  3-4 GLUCOSE TABS  OR 3-4 OZ OF JUICE OR REGULAR SODA OR ONE TUBE OF GLUCOSE GEL    STEP 2: RECHECK blood sugar in 15 MINUTES STEP 3: If your blood sugar is still low at the 15 minute recheck --> then, go back to STEP 1 and treat AGAIN with another 15 grams of carbohydrates.

## 2022-07-25 NOTE — Progress Notes (Signed)
Name: Travis [name]  MRN/ DOB: [id number], 1960/12/13   Age/ Sex: 61 y.o., male    PCP: Redmond School, MD   Reason for Endocrinology Evaluation: Type 2 Diabetes Mellitus     Date of Initial Endocrinology Visit: 11/28/2021    PATIENT IDENTIFIER: [name] is a 61 y.o. male with a past medical history of T2DM, HTN, and dyslipidemia. The patient presented for initial endocrinology clinic visit on 11/28/2021 for consultative assistance with his diabetes management.    HPI: Travis [name] is accompanied bu his wife who is also a nurse   Diagnosed with DM in many years ago          Hemoglobin A1c has ranged from 6.3% in 2018, peaking at 10.6% in 2012.   On his initial visit to our clinic his A1c was 10.4%, he was on glipizide, a carbose, and metformin.  We continued metformin, started Lantus, Trulicity, and stopped acarbose   We had to restart glipizide June 2023 due to hyperglycemia  Trulicity 2.33 mg caused nausea and vomiting May 2023  Jardiance started 02/2022  SUBJECTIVE:   During the last visit (03/15/2022): A1c 11.0%     Today (07/25/22): Travis [name] is here for follow-up on diabetes management.  He checks his blood sugars occasionally .  The patient did not bring his meter today.  The patient has not had hypoglycemic episodes since the last clinic visit, which typically    He forgot to take his morning meds 5 days last week  Denies nausea or vomiting      HOME DIABETES REGIMEN: Metformin 1000 mg BID  Glipizide 5 mg BID Lantus 50 units daily Jardiance 39m daily       Statin: Yes ACE-I/ARB: Yes    CONTINUOUS GLUCOSE MONITORING RECORD INTERPRETATION    Dates of Recording: 11/17-11/30/2023  Sensor description:freestyle libre   Results statistics:   CGM use % of time 77  Average and SD 197/32.3  Time in range     40   %  % Time Above 180 40  % Time above 250 20  % Time Below target 0   Glycemic patterns summary: Bg's optimal during the  night and high during the day   Hyperglycemic episodes  postprandial  Hypoglycemic episodes occurred n/a  Overnight periods: trends down    DIABETIC COMPLICATIONS: Microvascular complications:   Denies: CKD, retinopathy,  Last eye exam: Completed 07/2021  Macrovascular complications:   Denies: CAD, PVD, CVA   PAST HISTORY: Past Medical History:  Past Medical History:  Diagnosis Date   Arthritis    Dyslipidemia    Dyspnea    On exertion   Fatigue    NAUSEA,BLOATING   H. pylori infection 09/15/2006   Hematochezia 08/30/2007   History of nuclear stress test 04/03/2009   exercise myoview; normal pattern of perfusion; low risk scan    Hypertension    treated   Kidney stones    NIDDM (non-insulin dependent diabetes mellitus) 2011   type 2   [place]    Tobacco chew use    Unspecified vitamin D deficiency    Past Surgical History:  Past Surgical History:  Procedure Laterality Date   APPENDECTOMY     HERNIA REPAIR     RIGHT   KNEE SURGERY     BOTH   TOTAL KNEE ARTHROPLASTY Right 10/30/2020   Procedure: TOTAL KNEE ARTHROPLASTY;  Surgeon: AGaynelle Arabian MD;  Location: [facility];  Service: Orthopedics;  Laterality: Right;  521m  TRANSTHORACIC ECHOCARDIOGRAM  04/03/2009   EF=>55%; trace TR, mild pulm regurg    Social History:  reports that he has never smoked. He quit smokeless tobacco use about 20 months ago.  His smokeless tobacco use included snuff and chew. He reports that he does not drink alcohol and does not use drugs. Family History:  Family History  Problem Relation Age of Onset   Stroke Father 17   Pneumonia Father    Heart failure Mother 71   Coronary artery disease Mother        also MI   Heart attack Mother    Heart attack Maternal Uncle        died in 97s   Cancer Other        x2; maternal side   Diabetes Maternal Grandmother      HOME MEDICATIONS: Allergies as of 07/25/2022       Reactions   Doxycycline Other (See  Comments)   Reaction:  Blisters    Gabapentin    Other reaction(s): Hallucinations, Lightheadedness   Methocarbamol    Other reaction(s): Confusion, Hallucinations   Lisinopril Swelling   Lyrica [pregabalin]    Promethazine    Hallucinations        Medication List        Accurate as of July 25, 2022  1:57 PM. If you have any questions, ask your nurse or doctor.          Accu-Chek Guide test strip Generic drug: glucose blood 1 each by Other route daily. Use as instructed   Accu-Chek Guide w/Device Kit 1 Device by Does not apply route daily in the afternoon.   amLODipine 10 MG tablet Commonly known as: NORVASC Take 1 tablet (10 mg total) by mouth daily. For high blood pressure   atorvastatin 20 MG tablet Commonly known as: LIPITOR Take 1 tablet (20 mg total) by mouth daily. For high cholesterol What changed: when to take this   carbamazepine 200 MG tablet Commonly known as: TEGRETOL Take 200 mg by mouth 2 (two) times daily.   empagliflozin 10 MG Tabs tablet Commonly known as: Jardiance Take 1 tablet (10 mg total) by mouth daily before breakfast.   Excedrin Extra Strength 250-250-65 MG tablet Generic drug: aspirin-acetaminophen-caffeine Take 1 tablet by mouth in the morning and at bedtime.   FLUoxetine 20 MG capsule Commonly known as: PROZAC Take 60 mg by mouth every morning.   FreeStyle Libre 3 Sensor Misc 1 Device by Does not apply route every 14 (fourteen) days.   furosemide 20 MG tablet Commonly known as: LASIX Take 20 mg by mouth daily.   glipiZIDE 5 MG tablet Commonly known as: GLUCOTROL Take 1 tablet (5 mg total) by mouth 2 (two) times daily before a meal.   hydrochlorothiazide 25 MG tablet Commonly known as: HYDRODIURIL Take 25 mg by mouth every evening.   indomethacin 25 MG capsule Commonly known as: INDOCIN Take 25 mg by mouth daily as needed for mild pain or moderate pain.   Insulin Syringes (Disposable) U-100 0.3 ML Misc 1  Device by Does not apply route daily in the afternoon.   Klor-Con M20 20 MEQ tablet Generic drug: potassium chloride SA Take 20 mEq by mouth at bedtime.   Lantus SoloStar 100 UNIT/ML Solostar Pen Generic drug: insulin glargine Inject 45 Units into the skin daily. What changed: how much to take   losartan 100 MG tablet Commonly known as: COZAAR Take 100 mg by mouth daily.   metFORMIN 1000 MG  tablet Commonly known as: GLUCOPHAGE Take 1 tablet (1,000 mg total) by mouth 2 (two) times daily with a meal.   nitroGLYCERIN 0.4 MG SL tablet Commonly known as: NITROSTAT Place 1 tablet (0.4 mg total) under the tongue every 5 (five) minutes as needed for chest pain.   pantoprazole 40 MG tablet Commonly known as: PROTONIX Take 1 tablet (40 mg total) by mouth daily. For acid reflux What changed: when to take this   pramipexole 1.5 MG tablet Commonly known as: MIRAPEX Take 1.5 mg by mouth at bedtime.   tamsulosin 0.4 MG Caps capsule Commonly known as: FLOMAX Take 1 capsule (0.4 mg total) by mouth daily after breakfast. For prostate health   traZODone 100 MG tablet Commonly known as: DESYREL Take 1 tablet (100 mg total) by mouth at bedtime. For sleep         ALLERGIES: Allergies  Allergen Reactions   Doxycycline Other (See Comments)    Reaction:  Blisters    Gabapentin     Other reaction(s): Hallucinations, Lightheadedness   Methocarbamol     Other reaction(s): Confusion, Hallucinations   Lisinopril Swelling   Lyrica [Pregabalin]    Promethazine     Hallucinations        OBJECTIVE:   VITAL SIGNS: BP 130/78 (BP Location: Left Arm, Patient Position: Sitting, Cuff Size: Small)   Pulse (!) 105   Ht _0  (1.651 m)   Wt 195 lb (88.5 kg)   SpO2 97%   BMI 32.45 kg/m    PHYSICAL EXAM:  General: Pt appears well and is in NAD  Neck: General: Supple without adenopathy or carotid bruits. Thyroid: Thyroid size normal.  No goiter or nodules appreciated.  Lungs: Clear  with good BS bilat with no rales, rhonchi, or wheezes  Heart: RRR with normal S1 and S2 and no gallops; no murmurs; no rub  Abdomen: Normoactive bowel sounds, soft, nontender, without masses or organomegaly palpable  Extremities:  Lower extremities - No pretibial edema. No lesions.  Neuro: MS is good with appropriate affect, pt is alert and Ox3    DM foot exam: 11/28/2021  The skin of the feet is intact without sores or ulcerations. The pedal pulses are 2+ on right and 2+ on left. The sensation is decreased to a screening 5.07, 10 gram monofilament bilaterally   DATA REVIEWED:  Lab Results  Component Value Date   HGBA1C 8.2 (A) 07/25/2022   HGBA1C 11.0 (A) 03/15/2022   HGBA1C 10.4 (A) 11/28/2021    Latest Reference Range & Units 03/11/22 17:21  Sodium 135 - 145 mmol/L 137  Potassium 3.5 - 5.1 mmol/L 3.9  Chloride 98 - 111 mmol/L 105  CO2 22 - 32 mmol/L 24  Glucose 70 - 99 mg/dL 229 (H)  BUN 8 - 23 mg/dL 22  Creatinine 0.61 - 1.24 mg/dL 1.02  Calcium 8.9 - 10.3 mg/dL 8.9  Anion gap 5 - 15  8  Alkaline Phosphatase 38 - 126 U/L 62  Albumin 3.5 - 5.0 g/dL 3.7  Lipase 11 - 51 U/L 50  AST 15 - 41 U/L 22  ALT 0 - 44 U/L 28  Total Protein 6.5 - 8.1 g/dL 6.9  Total Bilirubin 0.3 - 1.2 mg/dL 0.4  GFR, Estimated >60 mL/min >60  (H): Data is abnormally high ASSESSMENT / PLAN / RECOMMENDATIONS:   1) Type 2 Diabetes Mellitus, poorly controlled, With neuropathic complications - Most recent A1c of 8.2%. Goal A1c <7.0%.    -I have praised the patient  improved glycemic control A1c down from 11.0% to 8.2% -He has been using freestyle libre and so far he has liked it -Unfortunately he continues with dietary indiscretions, today we discussed the importance of reducing carbohydrate intake gradually I have encouraged the patient that if he reduces his carbohydrate intake he will need less medication -Intolerant to Trulicity 8.78 mg weekly due to GI side effects -I will reduce his Lantus, as  he has been noted with tight BG's -Will increase Jardiance as below -I have prescribed Toujeo to be refilled the beginning of the year as their insurance will be changing -We again discussed risk of microvascular complications to include blindness, ESRD, and increased risk of amputations -Patient is skeptical about metformin, we discussed the risk and the benefits of metformin, he is okay with continuing at this time  MEDICATIONS:  -Decrease Lantus 45 units daily -Increase Jardiance 25 mg daily -Continue glipizide 5 mg twice daily - Continue metformin 1000 mg twice daily  EDUCATION / INSTRUCTIONS: BG monitoring instructions: Patient is instructed to check his blood sugars 1 times a day, fasting. Call [facility] if: BG persistently < 70  I reviewed the Rule of 15 for the treatment of hypoglycemia in detail with the patient. Literature supplied.   2) Diabetic complications:  Eye: Does not have known diabetic retinopathy.  Neuro/ Feet: Does  have known diabetic peripheral neuropathy. Renal: Patient does not have known baseline CKD. He is  on an ACEI/ARB at present.    Follow-up in 6 months  I spent 25 minutes preparing to see the patient by review of recent labs, obtaining and reviewing separately obtained history, communicating with the patient/family or caregiver, ordering medications, and documenting clinical information in the EHR including the differential Dx, treatment, and any further evaluation and other management    Signed electronically by: Mack Guise, MD  [facility]  [facility] [address] [place], [place] [address] Phone: [phone] FAX: [phone]   CC: [address], [place] [address] Phone: [phone]  Fax: [phone]    Return to Endocrinology clinic as below: No future appointments.

## 2022-08-02 ENCOUNTER — Ambulatory Visit: Payer: Commercial Managed Care - PPO | Admitting: Internal Medicine

## 2022-08-29 ENCOUNTER — Other Ambulatory Visit: Payer: Self-pay

## 2022-08-29 ENCOUNTER — Encounter: Payer: Self-pay | Admitting: Internal Medicine

## 2022-08-29 MED ORDER — TOUJEO MAX SOLOSTAR 300 UNIT/ML ~~LOC~~ SOPN: 45.0000 [IU] | PEN_INJECTOR | Freq: Every day | SUBCUTANEOUS | 3 refills | Status: DC

## 2022-09-10 ENCOUNTER — Encounter: Payer: Self-pay | Admitting: Internal Medicine

## 2022-09-15 ENCOUNTER — Other Ambulatory Visit: Payer: Self-pay | Admitting: Internal Medicine

## 2022-09-22 ENCOUNTER — Encounter: Payer: Self-pay | Admitting: Internal Medicine

## 2022-11-18 ENCOUNTER — Other Ambulatory Visit: Payer: Self-pay | Admitting: Internal Medicine

## 2022-12-16 ENCOUNTER — Other Ambulatory Visit (INDEPENDENT_AMBULATORY_CARE_PROVIDER_SITE_OTHER): Payer: Self-pay | Admitting: Otolaryngology

## 2022-12-26 ENCOUNTER — Other Ambulatory Visit: Payer: Self-pay | Admitting: Internal Medicine

## 2023-01-29 ENCOUNTER — Encounter: Payer: Self-pay | Admitting: Internal Medicine

## 2023-01-29 ENCOUNTER — Ambulatory Visit (INDEPENDENT_AMBULATORY_CARE_PROVIDER_SITE_OTHER): Payer: BC Managed Care – PPO | Admitting: Internal Medicine

## 2023-01-29 VITALS — BP 124/80 | HR 98 | Ht 65.0 in | Wt 187.0 lb

## 2023-01-29 DIAGNOSIS — N179 Acute kidney failure, unspecified: Secondary | ICD-10-CM

## 2023-01-29 DIAGNOSIS — E1142 Type 2 diabetes mellitus with diabetic polyneuropathy: Secondary | ICD-10-CM

## 2023-01-29 DIAGNOSIS — E1165 Type 2 diabetes mellitus with hyperglycemia: Secondary | ICD-10-CM | POA: Diagnosis not present

## 2023-01-29 DIAGNOSIS — Z794 Long term (current) use of insulin: Secondary | ICD-10-CM

## 2023-01-29 LAB — BASIC METABOLIC PANEL
BUN: 36 mg/dL — ABNORMAL HIGH (ref 6–23)
CO2: 22 mEq/L (ref 19–32)
Calcium: 9.9 mg/dL (ref 8.4–10.5)
Chloride: 98 mEq/L (ref 96–112)
Creatinine, Ser: 1.54 mg/dL — ABNORMAL HIGH (ref 0.40–1.50)
GFR: 47.68 mL/min — ABNORMAL LOW (ref >60.00)
Glucose, Bld: 94 mg/dL (ref 70–99)
Potassium: 4.2 mEq/L (ref 3.5–5.1)
Sodium: 137 mEq/L (ref 135–145)

## 2023-01-29 LAB — POCT GLYCOSYLATED HEMOGLOBIN (HGB A1C): Hemoglobin A1C: 8.4 % — AB (ref 4.0–5.6)

## 2023-01-29 MED ORDER — SEMAGLUTIDE(0.25 OR 0.5MG/DOS) 2 MG/3ML ~~LOC~~ SOPN: 0.5000 mg | PEN_INJECTOR | SUBCUTANEOUS | 3 refills | Status: DC

## 2023-01-29 NOTE — Patient Instructions (Addendum)
-   Start Ozempic 0.25 mg weekly for 6 weeks, than increase to 0.5 mg weekly  - Decrease  Toujeo  35 units ONCE daily  - Continue Jardiance 25 mg daily  - Continue Glipizide 5 mg , 1 tablet before Breakfast and 1 tablet before supper - Continue Metformin 1000 mg Twice daily      HOW TO TREAT LOW BLOOD SUGARS (Blood sugar LESS THAN 70 MG/DL) Please follow the RULE OF 15 for the treatment of hypoglycemia treatment (when your (blood sugars are less than 70 mg/dL)   STEP 1: Take 15 grams of carbohydrates when your blood sugar is low, which includes:  3-4 GLUCOSE TABS  OR 3-4 OZ OF JUICE OR REGULAR SODA OR ONE TUBE OF GLUCOSE GEL    STEP 2: RECHECK blood sugar in 15 MINUTES STEP 3: If your blood sugar is still low at the 15 minute recheck --> then, go back to STEP 1 and treat AGAIN with another 15 grams of carbohydrates.

## 2023-01-29 NOTE — Progress Notes (Unsigned)
Name: Travis [name]  MRN/ DOB: [id number], [birth year]   Age/ Sex: 62 y.o., male    PCP: Elfredia Nevins, MD   Reason for Endocrinology Evaluation: Type 2 Diabetes Mellitus     Date of Initial Endocrinology Visit: 11/28/2021    PATIENT IDENTIFIER: [name] is a 62 y.o. male with a past medical history of T2DM, HTN, and dyslipidemia. The patient presented for initial endocrinology clinic visit on 11/28/2021 for consultative assistance with his diabetes management.    HPI: Travis [name] is accompanied bu his wife who is also a nurse   Diagnosed with DM in many years ago          Hemoglobin A1c has ranged from 6.3% in 2018, peaking at 10.6% in 2012.   On his initial visit to our clinic his A1c was 10.4%, he was on glipizide, a carbose, and metformin.  We continued metformin, started Lantus, Trulicity, and stopped acarbose   We had to restart glipizide June 2023 due to hyperglycemia  Trulicity 0.75 mg caused nausea and vomiting May 2023  Jardiance started 02/2022  SUBJECTIVE:   During the last visit (07/25/2022): A1c 8.2%     Today (01/29/23): Travis [name] is here for follow-up on diabetes management.  He is accompanied by his spouse today.  He checks his blood sugars multiple times daily through [employer]. The patient has had hypoglycemic episodes since the last clinic visit, which typically occur at night     Received prednisone for tongue ulcer x2   Had minimal  nausea but no  vomiting  He is on protonix for GERD No Constipation  but occasional diarrhea    HOME DIABETES REGIMEN: Metformin 1000 mg BID  Glipizide 5 mg BID Toujeo 45 units daily Jardiance 25 mg daily       Statin: Yes ACE-I/ARB: Yes    CONTINUOUS GLUCOSE MONITORING RECORD INTERPRETATION    Dates of Recording: 5/23-01/29/2023  Sensor description:freestyle libre   Results statistics:   CGM use % of time 87  Average and SD 201/39.7  Time in range  45  %  % Time Above 180 27   % Time above 250 27  % Time Below target 1   Glycemic patterns summary: Bg's optimal during the night and increase during the day Hyperglycemic episodes  postprandial  Hypoglycemic episodes occurred at night  Overnight periods: trends down    DIABETIC COMPLICATIONS: Microvascular complications:   Denies: CKD, retinopathy,  Last eye exam: Completed 07/2021  Macrovascular complications:   Denies: CAD, PVD, CVA   PAST HISTORY: Past Medical History:  Past Medical History:  Diagnosis Date   Arthritis    Dyslipidemia    Dyspnea    On exertion   Fatigue    NAUSEA,BLOATING   H. pylori infection 09/15/2006   Hematochezia 08/30/2007   History of nuclear stress test 04/03/2009   exercise myoview; normal pattern of perfusion; low risk scan    Hypertension    treated   Kidney stones    NIDDM (non-insulin dependent diabetes mellitus) 2011   type 2   Schizoaffective disorder (HCC)    Tobacco chew use    Unspecified vitamin D deficiency    Past Surgical History:  Past Surgical History:  Procedure Laterality Date   APPENDECTOMY     HERNIA REPAIR     RIGHT   KNEE SURGERY     BOTH   TOTAL KNEE ARTHROPLASTY Right 10/30/2020   Procedure: TOTAL KNEE ARTHROPLASTY;  Surgeon: Ollen Gross, MD;  Location: [facility];  Service: Orthopedics;  Laterality: Right;    TRANSTHORACIC ECHOCARDIOGRAM  04/03/2009   EF=>55%; trace TR, mild pulm regurg    Social History:  reports that he has never smoked. He quit smokeless tobacco use about 2 years ago.  His smokeless tobacco use included snuff and chew. He reports that he does not drink alcohol and does not use drugs. Family History:  Family History  Problem Relation Age of Onset   Stroke Father 62   Pneumonia Father    Heart failure Mother 61   Coronary artery disease Mother        also MI   Heart attack Mother    Heart attack Maternal Uncle        died in 72s   Cancer Other        x2; maternal side   Diabetes Maternal  Grandmother      HOME MEDICATIONS: Allergies as of 01/29/2023       Reactions   Doxycycline Other (See Comments)   Reaction:  Blisters    Gabapentin    Other reaction(s): Hallucinations, Lightheadedness   Methocarbamol    Other reaction(s): Confusion, Hallucinations   Lisinopril Swelling   Lyrica [pregabalin]    Promethazine    Hallucinations        Medication List        Accurate as of January 29, 2023  3:34 PM. If you have any questions, ask your nurse or doctor.          Accu-Chek Guide test strip Generic drug: glucose blood 1 each by Other route daily. Use as instructed   Accu-Chek Guide w/Device Kit 1 Device by Does not apply route daily in the afternoon.   amLODipine 10 MG tablet Commonly known as: NORVASC Take 1 tablet (10 mg total) by mouth daily. For high blood pressure   atorvastatin 20 MG tablet Commonly known as: LIPITOR Take 1 tablet (20 mg total) by mouth daily. For high cholesterol What changed: when to take this   BD Pen Needle Nano 2nd Gen 32G X 4 MM Misc Generic drug: Insulin Pen Needle USE DAILY TO inject lantus   carbamazepine 200 MG tablet Commonly known as: TEGRETOL Take 200 mg by mouth 2 (two) times daily.   empagliflozin 25 MG Tabs tablet Commonly known as: Jardiance Take 1 tablet (25 mg total) by mouth daily before breakfast.   Excedrin Extra Strength 250-250-65 MG tablet Generic drug: aspirin-acetaminophen-caffeine Take 1 tablet by mouth in the morning and at bedtime.   FLUoxetine 20 MG capsule Commonly known as: PROZAC Take 60 mg by mouth every morning.   FreeStyle Libre 3 Sensor Misc 1 Device by Does not apply route every 14 (fourteen) days.   furosemide 20 MG tablet Commonly known as: LASIX Take 20 mg by mouth daily.   glipiZIDE 5 MG tablet Commonly known as: GLUCOTROL TAKE 1 TABLET BY MOUTH TWICE DAILY BEFORE A MEAL   hydrochlorothiazide 25 MG tablet Commonly known as: HYDRODIURIL Take 25 mg by mouth every  evening.   indomethacin 25 MG capsule Commonly known as: INDOCIN Take 25 mg by mouth daily as needed for mild pain or moderate pain.   Klor-Con M20 20 MEQ tablet Generic drug: potassium chloride SA Take 20 mEq by mouth at bedtime.   losartan 100 MG tablet Commonly known as: COZAAR Take 100 mg by mouth daily.   metFORMIN 1000 MG tablet Commonly known as: GLUCOPHAGE Take 1 tablet (1,000 mg total) by mouth 2 (  two) times daily with a meal.   nitroGLYCERIN 0.4 MG SL tablet Commonly known as: NITROSTAT Place 1 tablet (0.4 mg total) under the tongue every 5 (five) minutes as needed for chest pain.   pantoprazole 40 MG tablet Commonly known as: PROTONIX Take 1 tablet (40 mg total) by mouth daily. For acid reflux What changed: when to take this   pramipexole 1.5 MG tablet Commonly known as: MIRAPEX Take 1.5 mg by mouth at bedtime.   Semaglutide(0.25 or 0.5MG /DOS) 2 MG/3ML Sopn Inject 0.5 mg into the skin once a week. Started by: Scarlette Shorts, MD   tamsulosin 0.4 MG Caps capsule Commonly known as: FLOMAX Take 1 capsule (0.4 mg total) by mouth daily after breakfast. For prostate health   Toujeo Max SoloStar 300 UNIT/ML Solostar Pen Generic drug: insulin glargine (2 Unit Dial) Inject 45 Units into the skin daily in the afternoon.   traZODone 100 MG tablet Commonly known as: DESYREL Take 1 tablet (100 mg total) by mouth at bedtime. For sleep         ALLERGIES: Allergies  Allergen Reactions   Doxycycline Other (See Comments)    Reaction:  Blisters    Gabapentin     Other reaction(s): Hallucinations, Lightheadedness   Methocarbamol     Other reaction(s): Confusion, Hallucinations   Lisinopril Swelling   Lyrica [Pregabalin]    Promethazine     Hallucinations        OBJECTIVE:   VITAL SIGNS: BP 124/80 (BP Location: Left Arm, Patient Position: Sitting, Cuff Size: Large)   Pulse 98   Ht 5\' 5"  (1.651 m)   Wt 187 lb (84.8 kg)   SpO2 98%   BMI 31.12  kg/m    PHYSICAL EXAM:  General: Pt appears well and is in NAD  Lungs: Clear with good BS bilat   Heart: RRR   Abdomen: soft, nontender  Extremities:  Lower extremities - No pretibial edema  Neuro: MS is good with appropriate affect, pt is alert and Ox3    DM foot exam: 01/29/2023  The skin of the feet is intact without sores or ulcerations. The pedal pulses are 2+ on right and 2+ on left. The sensation is intact to a screening 5.07, 10 gram monofilament bilaterally   DATA REVIEWED:  Lab Results  Component Value Date   HGBA1C 8.4 (A) 01/29/2023   HGBA1C 8.2 (A) 07/25/2022   HGBA1C 11.0 (A) 03/15/2022   12/31/2022 BUN 43 CR 1.9 GFR 39    ASSESSMENT / PLAN / RECOMMENDATIONS:   1) Type 2 Diabetes Mellitus, poorly controlled, With neuropathic complications - Most recent A1c of 8.4%. Goal A1c <7.0%.    -A1c remains above goal -In reviewing his CGM data, patient has been noted with postprandial hyperglycemia, and hypoglycemia overnight -I will reduce his basal insulin as below, I initially was going to increase his glipizide but we opted to try a small dose of Ozempic.  He does have intolerance to Trulicity 0.75 mg weekly -Patient has been noted with low GFR of 39, during reviewing labs under care everywhere, this has been attributed to dehydration, repeat BMP today***  MEDICATIONS: -Decrease Toujeo Lantus 35 units daily -Start Ozempic 0.25 mg weekly for 6 weeks, then increase to 0.5 mg weekly -Continue Jardiance 25 mg daily -Continue glipizide 5 mg twice daily -Continue metformin 1000 mg twice daily  EDUCATION / INSTRUCTIONS: BG monitoring instructions: Patient is instructed to check his blood sugars 3 times a day. Call [facility] if: BG  persistently < 70  I reviewed the Rule of 15 for the treatment of hypoglycemia in detail with the patient. Literature supplied.   2) Diabetic complications:  Eye: Does not have known diabetic retinopathy.  Neuro/  Feet: Does  have known diabetic peripheral neuropathy. Renal: Patient does not have known baseline CKD. He is  on an ACEI/ARB at present.   3)Dyslipidemia:  -LDL has been at goal -Patient on atorvastatin per PCP   Follow-up in 6 months     Signed electronically by: Lyndle Herrlich, MD  [facility] Endocrinology  [facility] [address] [place] [place], [place] [address] Phone: [phone] FAX: [phone]   CC: Elfredia Nevins, MD [address] [place] [address] Phone: [phone]  Fax: [phone]    Return to Endocrinology clinic as below: Future Appointments  Date Time Provider Department Center  08/01/2023  2:20 PM Lyann Hagstrom, Konrad Dolores, MD [facility] None

## 2023-01-30 ENCOUNTER — Encounter: Payer: Self-pay | Admitting: Internal Medicine

## 2023-01-30 MED ORDER — EMPAGLIFLOZIN 25 MG PO TABS: 25.0000 mg | ORAL_TABLET | ORAL | 3 refills | Status: DC

## 2023-02-03 ENCOUNTER — Other Ambulatory Visit: Payer: Self-pay

## 2023-02-03 MED ORDER — SEMAGLUTIDE(0.25 OR 0.5MG/DOS) 2 MG/3ML ~~LOC~~ SOPN: 0.5000 mg | PEN_INJECTOR | SUBCUTANEOUS | 3 refills | Status: DC

## 2023-03-08 ENCOUNTER — Encounter: Payer: Self-pay | Admitting: Internal Medicine

## 2023-03-23 ENCOUNTER — Other Ambulatory Visit: Payer: Self-pay | Admitting: Internal Medicine

## 2023-05-26 ENCOUNTER — Other Ambulatory Visit: Payer: Self-pay | Admitting: Internal Medicine

## 2023-07-17 ENCOUNTER — Encounter: Payer: Self-pay | Admitting: Physician Assistant

## 2023-07-23 ENCOUNTER — Encounter (INDEPENDENT_AMBULATORY_CARE_PROVIDER_SITE_OTHER): Payer: Self-pay | Admitting: *Deleted

## 2023-08-01 ENCOUNTER — Ambulatory Visit: Payer: BC Managed Care – PPO | Admitting: Internal Medicine

## 2023-08-01 ENCOUNTER — Encounter: Payer: Self-pay | Admitting: Internal Medicine

## 2023-08-01 VITALS — BP 130/70 | HR 102 | Ht 65.0 in | Wt 189.0 lb

## 2023-08-01 DIAGNOSIS — E1165 Type 2 diabetes mellitus with hyperglycemia: Secondary | ICD-10-CM

## 2023-08-01 DIAGNOSIS — Z794 Long term (current) use of insulin: Secondary | ICD-10-CM

## 2023-08-01 DIAGNOSIS — Z7984 Long term (current) use of oral hypoglycemic drugs: Secondary | ICD-10-CM

## 2023-08-01 DIAGNOSIS — E1140 Type 2 diabetes mellitus with diabetic neuropathy, unspecified: Secondary | ICD-10-CM

## 2023-08-01 DIAGNOSIS — R7989 Other specified abnormal findings of blood chemistry: Secondary | ICD-10-CM | POA: Diagnosis not present

## 2023-08-01 DIAGNOSIS — Z7985 Long-term (current) use of injectable non-insulin antidiabetic drugs: Secondary | ICD-10-CM | POA: Diagnosis not present

## 2023-08-01 DIAGNOSIS — G63 Polyneuropathy in diseases classified elsewhere: Secondary | ICD-10-CM

## 2023-08-01 LAB — POCT GLUCOSE (DEVICE FOR HOME USE): POC Glucose: 215 mg/dL — AB (ref 70–99)

## 2023-08-01 MED ORDER — TIRZEPATIDE 5 MG/0.5ML ~~LOC~~ SOAJ
5.0000 mg | SUBCUTANEOUS | 3 refills | Status: DC
Start: 2023-08-01 — End: 2023-08-04

## 2023-08-01 NOTE — Progress Notes (Signed)
Name: Travis [name]  MRN/ DOB: [id number], 08/15/1961   Age/ Sex: 62 y.o., male    PCP: Elfredia Nevins, MD   Reason for Endocrinology Evaluation: Type 2 Diabetes Mellitus     Date of Initial Endocrinology Visit: 11/28/2021    PATIENT IDENTIFIER: Travis [name] is a 62 y.o. male with a past medical history of T2DM, HTN, and dyslipidemia. The patient presented for initial endocrinology clinic visit on 11/28/2021 for consultative assistance with his diabetes management.    HPI: [name] is accompanied bu his wife who is also a nurse   Diagnosed with DM in many years ago          Hemoglobin A1c has ranged from 6.3% in 2018, peaking at 10.6% in 2012.   On his initial visit to our clinic his A1c was 10.4%, he was on glipizide, a carbose, and metformin.  We continued metformin, started Lantus, Trulicity, and stopped acarbose   We had to restart glipizide June 2023 due to hyperglycemia  Trulicity 0.75 mg caused nausea and vomiting May 2023  Jardiance started 02/2022  SUBJECTIVE:   During the last visit (01/29/2023): A1c 8.4%     Today (08/01/23): [name] is here for follow-up on diabetes management.  He is accompanied by his spouse today.  He checks his blood sugars multiple times daily through [employer]. The patient has not had hypoglycemic episodes since the last clinic visit.   Has occasional nausea but no vomiting  No Constipation or  diarrhea   Has noted a rash on lower extremities for over a year , that is pruritic , has a referral to dermatology   Due to complaints of fatigue, his PCP check serum cortisol which was elevated as below, last neck glucocortoid 11/27th   Has tingling and pain of feet     HOME DIABETES REGIMEN: Metformin 1000 mg BID  Glipizide 5 mg BID Ozempic 0.5 mg weekly  Jardiance 25 mg, half a tab daily  Toujeo  40 units      Statin: Yes ACE-I/ARB: Yes    CONTINUOUS GLUCOSE MONITORING RECORD  INTERPRETATION    N/a   DIABETIC COMPLICATIONS: Microvascular complications:   Denies: CKD, retinopathy,  Last eye exam: Completed 07/2021  Macrovascular complications:   Denies: CAD, PVD, CVA   PAST HISTORY: Past Medical History:  Past Medical History:  Diagnosis Date   Arthritis    Dyslipidemia    Dyspnea    On exertion   Fatigue    NAUSEA,BLOATING   H. pylori infection 09/15/2006   Hematochezia 08/30/2007   History of nuclear stress test 04/03/2009   exercise myoview; normal pattern of perfusion; low risk scan    Hypertension    treated   Kidney stones    NIDDM (non-insulin dependent diabetes mellitus) 2011   type 2   Schizoaffective disorder (HCC)    Tobacco chew use    Unspecified vitamin D deficiency    Past Surgical History:  Past Surgical History:  Procedure Laterality Date   APPENDECTOMY     HERNIA REPAIR     RIGHT   KNEE SURGERY     BOTH   TOTAL KNEE ARTHROPLASTY Right 10/30/2020   Procedure: TOTAL KNEE ARTHROPLASTY;  Surgeon: Ollen Gross, MD;  Location: [facility];  Service: Orthopedics;  Laterality: Right;    TRANSTHORACIC ECHOCARDIOGRAM  04/03/2009   EF=>55%; trace TR, mild pulm regurg    Social History:  reports that he has never smoked. He quit smokeless tobacco use  about 2 years ago.  His smokeless tobacco use included snuff and chew. He reports that he does not drink alcohol and does not use drugs. Family History:  Family History  Problem Relation Age of Onset   Stroke Father 54   Pneumonia Father    Heart failure Mother 50   Coronary artery disease Mother        also MI   Heart attack Mother    Heart attack Maternal Uncle        died in 26s   Cancer Other        x2; maternal side   Diabetes Maternal Grandmother      HOME MEDICATIONS: Allergies as of 08/01/2023       Reactions   Doxycycline Other (See Comments)   Reaction:  Blisters    Gabapentin    Other reaction(s): Hallucinations, Lightheadedness   Methocarbamol     Other reaction(s): Confusion, Hallucinations   Lisinopril Swelling   Lyrica [pregabalin]    Promethazine    Hallucinations        Medication List        Accurate as of August 01, 2023  2:03 PM. If you have any questions, ask your nurse or doctor.          Accu-Chek Guide test strip Generic drug: glucose blood 1 each by Other route daily. Use as instructed   Accu-Chek Guide w/Device Kit 1 Device by Does not apply route daily in the afternoon.   amLODipine 10 MG tablet Commonly known as: NORVASC Take 1 tablet (10 mg total) by mouth daily. For high blood pressure   atorvastatin 20 MG tablet Commonly known as: LIPITOR Take 1 tablet (20 mg total) by mouth daily. For high cholesterol What changed: when to take this   BD Pen Needle Nano 2nd Gen 32G X 4 MM Misc Generic drug: Insulin Pen Needle USE DAILY TO inject lantus   carbamazepine 200 MG tablet Commonly known as: TEGRETOL Take 200 mg by mouth 2 (two) times daily.   empagliflozin 25 MG Tabs tablet Commonly known as: Jardiance Take 1 tablet (25 mg total) by mouth as directed. Half a tablet daily   Excedrin Extra Strength 250-250-65 MG tablet Generic drug: aspirin-acetaminophen-caffeine Take 1 tablet by mouth in the morning and at bedtime.   FeroSul 325 (65 Fe) MG tablet Generic drug: ferrous sulfate Take 325 mg by mouth 3 (three) times daily.   FLUoxetine 40 MG capsule Commonly known as: PROZAC TAKE ONE CAPSULE BY MOUTH DAILY along with THE 20 MG FOR a total of 60 MG   FLUoxetine 20 MG capsule Commonly known as: PROZAC Take 60 mg by mouth every morning.   FreeStyle Libre 3 Sensor Misc Use 1 device  every 14 (fourteen) days as directed.   furosemide 20 MG tablet Commonly known as: LASIX Take 20 mg by mouth daily.   gemfibrozil 600 MG tablet Commonly known as: LOPID Take 600 mg by mouth 2 (two) times daily.   glipiZIDE 5 MG tablet Commonly known as: GLUCOTROL TAKE 1 TABLET BY MOUTH TWICE DAILY  BEFORE A MEAL   hydrochlorothiazide 25 MG tablet Commonly known as: HYDRODIURIL Take 25 mg by mouth every evening.   HYDROcodone-acetaminophen 5-325 MG tablet Commonly known as: NORCO/VICODIN Take 1 tablet by mouth every 6 (six) hours as needed.   hydrOXYzine 25 MG tablet Commonly known as: ATARAX TAKE 1 TABLET BY MOUTH DAILY AS NEEDED FOR ANXIETY OR SLEEP   ibuprofen 600 MG tablet Commonly known  as: ADVIL Take 600 mg by mouth every 6 (six) hours as needed.   indomethacin 25 MG capsule Commonly known as: INDOCIN Take 25 mg by mouth daily as needed for mild pain or moderate pain.   Klor-Con M20 20 MEQ tablet Generic drug: potassium chloride SA Take 20 mEq by mouth at bedtime.   Lagevrio 200 MG Caps capsule Generic drug: molnupiravir EUA TAKE FOUR CAPSULES BY MOUTH EVERY TWELVE HOURS FOR 5 DAYS   losartan 100 MG tablet Commonly known as: COZAAR Take 100 mg by mouth daily.   meclizine 25 MG tablet Commonly known as: ANTIVERT TAKE 1 TABLET BY MOUTH FOUR TIMES DAILY AS NEEDED FOR dizziness   meloxicam 7.5 MG tablet Commonly known as: MOBIC Take 1 tablet by mouth daily.   metFORMIN 1000 MG tablet Commonly known as: GLUCOPHAGE Take 1 tablet (1,000 mg total) by mouth 2 (two) times daily with a meal.   nitroGLYCERIN 0.4 MG SL tablet Commonly known as: NITROSTAT Place 1 tablet (0.4 mg total) under the tongue every 5 (five) minutes as needed for chest pain.   pantoprazole 40 MG tablet Commonly known as: PROTONIX Take 1 tablet (40 mg total) by mouth daily. For acid reflux What changed: when to take this   pramipexole 1.5 MG tablet Commonly known as: MIRAPEX Take 1.5 mg by mouth at bedtime.   Semaglutide(0.25 or 0.5MG /DOS) 2 MG/3ML Sopn Inject 0.5 mg into the skin once a week.   tamsulosin 0.4 MG Caps capsule Commonly known as: FLOMAX Take 1 capsule (0.4 mg total) by mouth daily after breakfast. For prostate health   Toujeo Max SoloStar 300 UNIT/ML Solostar  Pen Generic drug: insulin glargine (2 Unit Dial) Inject 45 Units into the skin daily in the afternoon.   traMADol 50 MG tablet Commonly known as: ULTRAM Take 1 tablet by mouth every 6 (six) hours.   traZODone 150 MG tablet Commonly known as: DESYREL TAKE 1 TABLET BY MOUTH AT BEDTIME AS NEEDED INSOMNIA   traZODone 100 MG tablet Commonly known as: DESYREL Take 1 tablet (100 mg total) by mouth at bedtime. For sleep   Vitamin D (Ergocalciferol) 1.25 MG (50000 UNIT) Caps capsule Commonly known as: DRISDOL Take 50,000 Units by mouth once a week.         ALLERGIES: Allergies  Allergen Reactions   Doxycycline Other (See Comments)    Reaction:  Blisters    Gabapentin     Other reaction(s): Hallucinations, Lightheadedness   Methocarbamol     Other reaction(s): Confusion, Hallucinations   Lisinopril Swelling   Lyrica [Pregabalin]    Promethazine     Hallucinations        OBJECTIVE:   VITAL SIGNS: BP 130/70 (BP Location: Right Arm, Patient Position: Sitting, Cuff Size: Large)   Pulse (!) 102   Ht 5\' 5"  (1.651 m)   Wt 189 lb (85.7 kg)   SpO2 97%   BMI 31.45 kg/m    PHYSICAL EXAM:  General: Pt appears well and is in NAD  Lungs: Clear with good BS bilat   Heart: RRR   Extremities:  Lower extremities - No pretibial edema patient with erythematous macules over the shins bilaterally  Neuro: MS is good with appropriate affect, pt is alert and Ox3    DM foot exam: 08/01/2023  The skin of the feet is without sores or ulcerations, skin flakes noted The pedal pulses are 2+ on right and 2+ on left. The sensation is decreased to a screening 5.07, 10 gram monofilament  bilaterally   DATA REVIEWED:  Lab Results  Component Value Date   HGBA1C 8.4 (A) 01/29/2023   HGBA1C 8.2 (A) 07/25/2022   HGBA1C 11.0 (A) 03/15/2022   07/17/2023  BUN 27 CR 1.07 GFR 77 Na 140 K 4.9 TG 307 HDL 24 LDL 67 Alb/CR ratio 10 A1c 7.7 Cortisol 19.5 ( reference 6.2-19.4)      Latest Reference Range & Units 01/29/23 14:16  Sodium 135 - 145 mEq/L 137  Potassium 3.5 - 5.1 mEq/L 4.2  Chloride 96 - 112 mEq/L 98  CO2 19 - 32 mEq/L 22  Glucose 70 - 99 mg/dL 94  BUN 6 - 23 mg/dL 36 (H)  Creatinine 8.65 - 1.50 mg/dL 7.84 (H)  Calcium 8.4 - 10.5 mg/dL 9.9  GFR >69.62 mL/min 47.68 (L)    ASSESSMENT / PLAN / RECOMMENDATIONS:   1) Type 2 Diabetes Mellitus, poorly controlled, With neuropathic complications - Most recent A1c of 7.7%. Goal A1c <7.0%.    -A1c remains above goal -He is intolerant to small dose of Trulicity as well as Ozempic due to GI side effects -I have recommended switching to [facility] which she is in agreement of -I will increase Toujeo as below -We had decreased Jardiance to half a tablet due to dehydration and AKI   MEDICATIONS: -Stop Ozempic -Start Mounjaro 5 mg weekly -Increase Toujeo 44 units daily -Continue Jardiance 25 mg, half a tablet daily -Continue glipizide 5 mg twice daily -Continue metformin 1000 mg twice daily  EDUCATION / INSTRUCTIONS: BG monitoring instructions: Patient is instructed to check his blood sugars 3 times a day. Call [facility] if: BG persistently < 70  I reviewed the Rule of 15 for the treatment of hypoglycemia in detail with the patient. Literature supplied.   2) Diabetic complications:  Eye: Does not have known diabetic retinopathy.  Neuro/ Feet: Does  have known diabetic peripheral neuropathy. Renal: Patient does not have known baseline CKD. He is  on an ACEI/ARB at present.   3)Dyslipidemia:  -LDL has been at goal -Patient on atorvastatin per PCP   4) Elevated serum cortisol:  -He was evaluated through his PCPs office for fatigue and was noted to have an elevated serum cortisol at 19.5 (6.2-19.4) -Will proceed with 24-hour urinary cortisol  5) Peripheral neuropathy:  -Patient to use topical OTC remedies for breakthrough symptoms -He is intolerant to gabapentin and  Lyrica -Patient on Tegretol through PCPs office  Follow-up in 4 months     Signed electronically by: Lyndle Herrlich, MD  [facility] Endocrinology  [facility] [address] [place] [place], [place] [address] Phone: [phone] FAX: [phone]   CC: Elfredia Nevins, MD [address] [place] [address] Phone: [phone]  Fax: [phone]    Return to Endocrinology clinic as below: Future Appointments  Date Time Provider Department Center  08/01/2023  2:20 PM Tramell Piechota, Konrad Dolores, MD [facility] None  08/22/2023  1:15 PM [facility] NURSE [facility] None  08/22/2023  1:30 PM Marcos Eke, PA-C [facility] None

## 2023-08-01 NOTE — Patient Instructions (Addendum)
-   Stop Ozempic -Start Mounjaro 5 mg weekly -Increase Toujeo  44 units ONCE daily  - Continue Jardiance 25 mg , half a tablet daily - Continue Glipizide 5 mg , 1 tablet before Breakfast and 1 tablet before supper - Continue Metformin 1000 mg Twice daily   24-Hour Urine Collection  You will be collecting your urine for a 24-hour period of time. Your timer starts with your first urine of the morning (For example - If you first pee at 9AM, your timer will start at 9AM) Throw away your first urine of the morning Collect your urine every time you pee for the next 24 hours STOP your urine collection 24 hours after you started the collection (For example - You would stop at 9AM the day after you started)    HOW TO TREAT LOW BLOOD SUGARS (Blood sugar LESS THAN 70 MG/DL) Please follow the RULE OF 15 for the treatment of hypoglycemia treatment (when your (blood sugars are less than 70 mg/dL)   STEP 1: Take 15 grams of carbohydrates when your blood sugar is low, which includes:  3-4 GLUCOSE TABS  OR 3-4 OZ OF JUICE OR REGULAR SODA OR ONE TUBE OF GLUCOSE GEL    STEP 2: RECHECK blood sugar in 15 MINUTES STEP 3: If your blood sugar is still low at the 15 minute recheck --> then, go back to STEP 1 and treat AGAIN with another 15 grams of carbohydrates.

## 2023-08-04 ENCOUNTER — Other Ambulatory Visit: Payer: Self-pay

## 2023-08-04 MED ORDER — TIRZEPATIDE 5 MG/0.5ML ~~LOC~~ SOAJ: 5.0000 mg | SUBCUTANEOUS | 3 refills | Status: DC

## 2023-08-07 ENCOUNTER — Telehealth: Payer: Self-pay

## 2023-08-07 ENCOUNTER — Other Ambulatory Visit (HOSPITAL_COMMUNITY): Payer: Self-pay

## 2023-08-07 NOTE — Telephone Encounter (Signed)
Pharmacy Patient Advocate Encounter   Received notification from CoverMyMeds that prior authorization for [facility] is required/requested.   Both insurances on file have been terminated. Please have pt update insurance info so we can start a PA.

## 2023-08-11 ENCOUNTER — Telehealth: Payer: Self-pay | Admitting: Internal Medicine

## 2023-08-11 ENCOUNTER — Other Ambulatory Visit (HOSPITAL_COMMUNITY): Payer: Self-pay

## 2023-08-11 NOTE — Telephone Encounter (Signed)
Patient advised hi insurance is valid Ins need a Prior Auth for his medication [employer]. Please call patient

## 2023-08-12 ENCOUNTER — Other Ambulatory Visit (HOSPITAL_COMMUNITY): Payer: Self-pay

## 2023-08-12 NOTE — Telephone Encounter (Signed)
That is the insurance we are running and it says that it is terminated.   I also get this message when doing an eligibility check:

## 2023-08-14 ENCOUNTER — Telehealth: Payer: Self-pay

## 2023-08-14 ENCOUNTER — Other Ambulatory Visit (HOSPITAL_COMMUNITY): Payer: Self-pay

## 2023-08-14 NOTE — Telephone Encounter (Signed)
Patient wife states that when you are putting in the insurance you have to add a 01 on the end of the member number. They have contacted insurance and it is active. Need PA done ASAP

## 2023-08-14 NOTE — Telephone Encounter (Signed)
Pharmacy Patient Advocate Encounter   Received notification from Pt Calls Messages that prior authorization for Greggory Keen is required/requested.   Insurance verification completed.   The patient is insured through  [facility]  .   Per test claim: PA required; PA submitted to above mentioned insurance via CoverMyMeds Key/confirmation #/EOC [id number] Status is pending

## 2023-08-19 NOTE — Progress Notes (Incomplete)
Assessment/Plan:   Travis [name] is a very pleasant 62 y.o. year old RH male with a history of hypertension, hyperlipidemia, DM2 on Ozempic to switch to [facility], arthritis, anxiety, depression, vitamin D deficiency, seen today for evaluation of memory loss. MoCA today is /30.***.  Workup is in progress.  Patient is able to participate on his IADLs and continues to drive without significant difficulties.***    Memory Impairment of unclear etiology   Neurocognitive testing to further evaluate cognitive concerns and determine other underlying cause of memory changes, including potential contribution from sleep, anxiety, attention, or depression  Continue to control mood as per PCP Recommend good control of cardiovascular risk factors [place] up in 1-2  months***  Subjective:   The patient is accompanied by his wife who supplements the history.   How long did patient have memory difficulties? For the last 2 -3 years. Reports some difficulty remembering new information, conversations and names.  Long-term memory is good. Some days are great and some days are worse.  repeats oneself?  Endorsed Disoriented when walking into a room?  Patient denies except occasionally not remembering what patient came to the room for ***  Leaving objects in unusual places? Endorsed, for example the keys in different places  Wandering behavior?  denies .  Any personality changes?  Denies.   Any history of depression?: "Endorsed, for a long time", sees a psychiatrist. Hallucinations or paranoia? 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   Seizures?  Denies    Any sleep changes?  Depends on how I feel , especially how bad the back pain is. Denies vivid dreams, but " I talk a lot". Denies REM behavior or sleepwalking   Sleep apnea?  Endorsed, has not been using the CPAP  Any hygiene concerns?  Denies.   Independent of bathing and dressing?  Endorsed  Does the patient needs help  with medications? Wife is in charge   Who is in charge of the finances? Wife is in charge     Any changes in appetite?  "Bottomless pit".    Patient have trouble swallowing? Denies.   Does the patient cook?  Not much. Any kitchen accidents such as leaving the stove on? Denies.   Any history of headaches?  At times, possible se to BP and cervical arthritis  Chronic pain ?Endorsed , due  to arthritis, sees Ortho and Pain  specialists, had a cervical shot 1 month ago.   Ambulates with difficulty? Due to pain, R knee, he ambulates with some difficulty  Recent falls or head injuries? Fell from the ladder 3 times and this week and he fell on his knee  Vision changes? Denies.   Unilateral weakness, numbness or tingling? Denies.   Any tremors?   Denies.   Any anosmia?  Denies.   Any incontinence of urine? Urge incontinence Any bowel dysfunction? Depends on what I eat  Patient lives with his wife. History of heavy alcohol intake? Denies.   History of heavy tobacco use?  He quit smoking 2 years ago, but now he dips snuff and 2 tobacco, had a few head injuries in the past  (occipital area). He also had some hammers hitting his head in the past.  Family history of dementia? Father had dementia, and PGF.  Does patient drive? 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 pylori infection 09/15/2006   Hematochezia 08/30/2007   History of nuclear stress test 04/03/2009   exercise myoview; normal pattern of perfusion; low risk scan    Hypertension    treated   Kidney stones    NIDDM (non-insulin dependent diabetes mellitus) 2011   type 2   Schizoaffective disorder (HCC)    Tobacco chew use    Unspecified vitamin D deficiency      Past  Surgical History:  Procedure Laterality Date   APPENDECTOMY     HERNIA REPAIR     RIGHT   KNEE SURGERY     BOTH   TOTAL KNEE ARTHROPLASTY Right 10/30/2020   Procedure: TOTAL KNEE ARTHROPLASTY;  Surgeon: Ollen Gross, MD;  Location: [facility];  Service: Orthopedics;  Laterality: Right;    TRANSTHORACIC ECHOCARDIOGRAM  04/03/2009   EF=>55%; trace TR, mild pulm regurg     Allergies  Allergen Reactions   Doxycycline Other (See Comments)    Reaction:  Blisters    Gabapentin     Other reaction(s): Hallucinations, Lightheadedness   Methocarbamol     Other reaction(s): Confusion, Hallucinations   Lisinopril Swelling   Lyrica [Pregabalin]    Promethazine     Hallucinations    Current Outpatient Medications  Medication Instructions   amLODipine (NORVASC) 10 mg, Oral, Daily, For high blood pressure   aspirin-acetaminophen-caffeine (EXCEDRIN EXTRA STRENGTH) 250-250-65 MG tablet 1 tablet, Oral, 2 times daily   atorvastatin (LIPITOR) 20 mg, Oral, Daily, For high cholesterol   Blood Glucose Monitoring Suppl (ACCU-CHEK GUIDE) w/Device KIT 1 Device, Does not apply, Daily   carbamazepine (TEGRETOL) 200 mg, Oral, 2 times daily   Continuous Glucose Sensor (FREESTYLE LIBRE 3 SENSOR) MISC Use 1 device  every 14 (fourteen) days as directed.   empagliflozin (JARDIANCE) 25 mg, Oral, As directed, Half a tablet daily   FeroSul 325 mg, Oral, 3 times daily   FLUoxetine (PROZAC) 40 MG capsule TAKE ONE CAPSULE BY MOUTH DAILY along with THE 20 MG FOR a total of 60 MG   FLUoxetine (PROZAC) 60 mg, Oral, Every morning   furosemide (LASIX) 20 mg, Oral, Daily   gemfibrozil (LOPID) 600 mg, Oral, 2 times daily   glipiZIDE (GLUCOTROL) 5 mg, Oral, 2 times daily before meals   glucose blood (ACCU-CHEK GUIDE) test strip 1 each, Other, Daily, Use as instructed   hydrochlorothiazide (HYDRODIURIL) 25 mg, Oral, Every evening   HYDROcodone-acetaminophen (NORCO/VICODIN) 5-325 MG tablet 1 tablet, Oral, Every 6 hours PRN    hydrOXYzine (ATARAX) 25 MG tablet TAKE 1 TABLET BY MOUTH DAILY AS NEEDED FOR ANXIETY OR SLEEP   ibuprofen (ADVIL) 600 mg, Oral, Every 6 hours PRN   indomethacin (INDOCIN) 25 mg, Oral, Daily PRN   Insulin Pen Needle (BD PEN NEEDLE NANO 2ND GEN) 32G X 4 MM MISC USE DAILY TO inject lantus   KLOR-CON M20 20 MEQ tablet 20 mEq, Oral, Nightly   losartan (COZAAR) 100 mg, Oral, Daily   meclizine (ANTIVERT) 25 MG tablet TAKE 1 TABLET BY MOUTH FOUR TIMES DAILY AS NEEDED FOR dizziness   meloxicam (MOBIC) 7.5 MG tablet 1 tablet, Oral, Daily   metFORMIN (GLUCOPHAGE) 1,000 mg, Oral, 2 times daily with meals   molnupiravir EUA (LAGEVRIO) 200 MG CAPS capsule TAKE FOUR CAPSULES BY MOUTH EVERY TWELVE HOURS FOR  5 DAYS   nitroGLYCERIN (NITROSTAT) 0.4 mg, Sublingual, Every 5 min PRN   pantoprazole (PROTONIX) 40 mg, Oral, Daily, For acid reflux   pramipexole (MIRAPEX) 1.5 mg, Oral, Nightly   tamsulosin (FLOMAX) 0.4 mg, Oral, Daily after breakfast, For prostate health   tirzepatide (MOUNJARO) 5 mg, Subcutaneous, Weekly   Toujeo Max SoloStar 45 Units, Subcutaneous, Daily   traMADol (ULTRAM) 50 MG tablet 1 tablet, Oral, Every 6 hours   traZODone (DESYREL) 150 MG tablet TAKE 1 TABLET BY MOUTH AT BEDTIME AS NEEDED INSOMNIA   traZODone (DESYREL) 100 mg, Oral, Daily at bedtime, For sleep   Vitamin D (Ergocalciferol) (DRISDOL) 50,000 Units, Oral, Weekly     VITALS:  There were no vitals filed for this visit.    PHYSICAL EXAM   HEENT:  Normocephalic, atraumatic. The superficial temporal arteries are without ropiness or tenderness. Cardiovascular: Regular rate and rhythm. Lungs: Clear to auscultation bilaterally. Neck: There are no carotid bruits noted bilaterally.  NEUROLOGICAL:     No data to display              No data to display           Orientation:  Alert and oriented to person, place and time. No aphasia or dysarthria. Fund of knowledge is appropriate. Recent memory impaired and remote  memory intact.  Attention and concentration are normal.  Able to name objects and repeat phrases. Delayed recall  /5 Cranial nerves: There is good facial symmetry. Extraocular muscles are intact and visual fields are full to confrontational testing. Speech is fluent and clear. No tongue deviation. Hearing is intact to conversational tone. Tone: Tone is good throughout. Sensation: Sensation is intact to light touch. Vibration is intact at the bilateral big toe.  Coordination: The patient has no difficulty with RAM's or FNF bilaterally. Normal finger to nose  Motor: Strength is 5/5 in the bilateral upper and lower extremities. There is no pronator drift. There are no fasciculations noted. DTR's: Deep tendon reflexes are 2/4 bilaterally. Gait and Station: The patient is able to ambulate without difficulty The patient is able to heel toe walk . Gait is cautious and narrow. The patient is able to ambulate in a tandem fashion.       Thank you for allowing [place] the opportunity to participate in the care of this nice patient. Please do not hesitate to contact us for any questions or concerns.   Total time spent on today's visit was *** minutes dedicated to this patient today, preparing to see patient, examining the patient, ordering tests and/or medications and counseling the patient, documenting clinical information in the EHR or other health record, independently interpreting results and communicating results to the patient/family, discussing treatment and goals, answering patient's questions and coordinating care.  Cc:  Elfredia Nevins, MD  Marlowe Kays 08/19/2023 7:26 AM

## 2023-08-22 ENCOUNTER — Ambulatory Visit (INDEPENDENT_AMBULATORY_CARE_PROVIDER_SITE_OTHER): Payer: BC Managed Care – PPO | Admitting: Physician Assistant

## 2023-08-22 ENCOUNTER — Ambulatory Visit: Payer: BC Managed Care – PPO

## 2023-08-22 VITALS — BP 157/95 | HR 106 | Ht 65.0 in | Wt 187.0 lb

## 2023-08-22 DIAGNOSIS — R413 Other amnesia: Secondary | ICD-10-CM | POA: Diagnosis not present

## 2023-08-22 NOTE — Patient Instructions (Addendum)
It was a pleasure to see you today at our office.   Recommendations:  Neurocognitive evaluation at our office   Take care of the anemia, OSA and mood Follow up in March 28 at 1 pm  Recommend evaluation  by hematology    For psychiatric meds, mood meds: Please have your primary care physician manage these medications.  If you have any severe symptoms of a stroke, or other severe issues such as confusion,severe chills or fever, etc call 911 or go to the ER as you may need to be evaluated further    For assessment of decision of mental capacity and competency:  Call Dr. Erick Blinks, geriatric psychiatrist at [phone]  Counseling regarding caregiver distress, including caregiver depression, anxiety and issues regarding community resources, adult day care programs, adult living facilities, or memory care questions:  please contact your  Primary Doctor's Social Worker   Whom to call: Memory  decline, memory medications: Call our office [phone]    [email or web address]   RECOMMENDATIONS FOR ALL PATIENTS WITH MEMORY PROBLEMS: 1. Continue to exercise (Recommend 30 minutes of walking everyday, or 3 hours every week) 2. Increase social interactions - continue going to [place] and enjoy social gatherings with friends and family 3. Eat healthy, avoid fried foods and eat more fruits and vegetables 4. Maintain adequate blood pressure, blood sugar, and blood cholesterol level. Reducing the risk of stroke and cardiovascular disease also helps promoting better memory. 5. Avoid stressful situations. Live a simple life and avoid aggravations. Organize your time and prepare for the next day in anticipation. 6. Sleep well, avoid any interruptions of sleep and avoid any distractions in the bedroom that may interfere with adequate sleep quality 7. Avoid sugar, avoid sweets as there is a strong link between excessive sugar intake, diabetes, and  cognitive impairment We discussed the Mediterranean diet, which has been shown to help patients reduce the risk of progressive memory disorders and reduces cardiovascular risk. This includes eating fish, eat fruits and green leafy vegetables, nuts like almonds and hazelnuts, walnuts, and also use olive oil. Avoid fast foods and fried foods as much as possible. Avoid sweets and sugar as sugar use has been linked to worsening of memory function.  There is always a concern of gradual progression of memory problems. If this is the case, then we may need to adjust level of care according to patient needs. Support, both to the patient and caregiver, should then be put into place.      You have been referred for a neuropsychological evaluation (i.e., evaluation of memory and thinking abilities). Please bring someone with you to this appointment if possible, as it is helpful for the doctor to hear from both you and another adult who knows you well. Please bring eyeglasses and hearing aids if you wear them.    The evaluation will take approximately 3 hours and has two parts:   The first part is a clinical interview with the neuropsychologist (Dr. Milbert Coulter or Dr. Roseanne Reno). During the interview, the neuropsychologist will speak with you and the individual you brought to the appointment.    The second part of the evaluation is testing with the doctor's technician Annabelle Harman or Selena Batten). During the testing, the technician will ask you to remember different types of material, solve problems, and answer some questionnaires. Your family member will not be present for this portion of the evaluation.   Please note: We must reserve several hours of the neuropsychologist's time and the psychometrician's time for  your evaluation appointment. As such, there is a No-Show fee of $100. If you are unable to attend any of your appointments, please contact our office as soon as possible to reschedule.      DRIVING: Regarding driving,  in patients with progressive memory problems, driving will be impaired. We advise to have someone else do the driving if trouble finding directions or if minor accidents are reported. Independent driving assessment is available to determine safety of driving.   If you are interested in the driving assessment, you can contact the following:  [employer] in [place] [phone]  [facility] [phone]  [facility] [phone]   [facility] [phone] or [phone]   FALL PRECAUTIONS: Be cautious when walking. Scan the area for obstacles that may increase the risk of trips and falls. When getting up in the mornings, sit up at the edge of the bed for a few minutes before getting out of bed. Consider elevating the bed at the head end to avoid drop of blood pressure when getting up. Walk always in a well-lit room (use night lights in the walls). Avoid area rugs or power cords from appliances in the middle of the walkways. Use a walker or a cane if necessary and consider physical therapy for balance exercise. Get your eyesight checked regularly.  FINANCIAL OVERSIGHT: Supervision, especially oversight when making financial decisions or transactions is also recommended.  HOME SAFETY: Consider the safety of the kitchen when operating appliances like stoves, microwave oven, and blender. Consider having supervision and share cooking responsibilities until no longer able to participate in those. Accidents with firearms and other hazards in the house should be identified and addressed as well.   ABILITY TO BE LEFT ALONE: If patient is unable to contact 911 operator, consider using LifeLine, or when the need is there, arrange for someone to stay with patients. Smoking is a fire hazard, consider supervision or cessation. Risk of wandering should be assessed by caregiver and if detected at any point, supervision and safe proof recommendations should be  instituted.  MEDICATION SUPERVISION: Inability to self-administer medication needs to be constantly addressed. Implement a mechanism to ensure safe administration of the medications.      Mediterranean Diet A Mediterranean diet refers to food and lifestyle choices that are based on the traditions of countries located on the [employer] [place]. This way of eating has been shown to help prevent certain conditions and improve outcomes for people who have chronic diseases, like kidney disease and heart disease. What are tips for following this plan? Lifestyle  Cook and eat meals together with your family, when possible. Drink enough fluid to keep your urine clear or pale yellow. Be physically active every day. This includes: Aerobic exercise like running or swimming. Leisure activities like gardening, walking, or housework. Get 7-8 hours of sleep each night. If recommended by your health care provider, drink red wine in moderation. This means 1 glass a day for nonpregnant women and 2 glasses a day for men. A glass of wine equals 5 oz (150 mL). Reading food labels  Check the serving size of packaged foods. For foods such as rice and pasta, the serving size refers to the amount of cooked product, not dry. Check the total fat in packaged foods. Avoid foods that have saturated fat or trans fats. Check the ingredients list for added sugars, such as corn syrup. Shopping  At the grocery store, buy most of your food from the areas near the walls  of the store. This includes: Fresh fruits and vegetables (produce). Grains, beans, nuts, and seeds. Some of these may be available in unpackaged forms or large amounts (in bulk). Fresh seafood. Poultry and eggs. Low-fat dairy products. Buy whole ingredients instead of prepackaged foods. Buy fresh fruits and vegetables in-season from local farmers markets. Buy frozen fruits and vegetables in resealable bags. If you do not have access to quality fresh  seafood, buy precooked frozen shrimp or canned fish, such as tuna, salmon, or sardines. Buy small amounts of raw or cooked vegetables, salads, or olives from the deli or salad bar at your store. Stock your pantry so you always have certain foods on hand, such as olive oil, canned tuna, canned tomatoes, rice, pasta, and beans. Cooking  Cook foods with extra-virgin olive oil instead of using butter or other vegetable oils. Have meat as a side dish, and have vegetables or grains as your main dish. This means having meat in small portions or adding small amounts of meat to foods like pasta or stew. Use beans or vegetables instead of meat in common dishes like chili or lasagna. Experiment with different cooking methods. Try roasting or broiling vegetables instead of steaming or sauteing them. Add frozen vegetables to soups, stews, pasta, or rice. Add nuts or seeds for added healthy fat at each meal. You can add these to yogurt, salads, or vegetable dishes. Marinate fish or vegetables using olive oil, lemon juice, garlic, and fresh herbs. Meal planning  Plan to eat 1 vegetarian meal one day each week. Try to work up to 2 vegetarian meals, if possible. Eat seafood 2 or more times a week. Have healthy snacks readily available, such as: Vegetable sticks with hummus. Greek yogurt. Fruit and nut trail mix. Eat balanced meals throughout the week. This includes: Fruit: 2-3 servings a day Vegetables: 4-5 servings a day Low-fat dairy: 2 servings a day Fish, poultry, or lean meat: 1 serving a day Beans and legumes: 2 or more servings a week Nuts and seeds: 1-2 servings a day Whole grains: 6-8 servings a day Extra-virgin olive oil: 3-4 servings a day Limit red meat and sweets to only a few servings a month What are my food choices? Mediterranean diet Recommended Grains: Whole-grain pasta. Brown rice. Bulgar wheat. Polenta. Couscous. Whole-wheat bread. Orpah Cobb. Vegetables: Artichokes. Beets.  Broccoli. Cabbage. Carrots. Eggplant. Green beans. Chard. Kale. Spinach. Onions. Leeks. Peas. Squash. Tomatoes. Peppers. Radishes. Fruits: Apples. Apricots. Avocado. Berries. Bananas. Cherries. Dates. Figs. Grapes. Lemons. Melon. Oranges. Peaches. Plums. Pomegranate. Meats and other protein foods: Beans. Almonds. Sunflower seeds. Pine nuts. Peanuts. Cod. Salmon. Scallops. Shrimp. Tuna. Tilapia. Clams. Oysters. Eggs. Dairy: Low-fat milk. Cheese. Greek yogurt. Beverages: Water. Red wine. Herbal tea. Fats and oils: Extra virgin olive oil. Avocado oil. Grape seed oil. Sweets and desserts: [place] yogurt with honey. Baked apples. Poached pears. Trail mix. Seasoning and other foods: Basil. Cilantro. Coriander. Cumin. Mint. Parsley. Sage. Rosemary. Tarragon. Garlic. Oregano. Thyme. Pepper. Balsalmic vinegar. Tahini. Hummus. Tomato sauce. Olives. Mushrooms. Limit these Grains: Prepackaged pasta or rice dishes. Prepackaged cereal with added sugar. Vegetables: Deep fried potatoes (french fries). Fruits: Fruit canned in syrup. Meats and other protein foods: Beef. Pork. Lamb. Poultry with skin. Hot dogs. Tomasa Blase. Dairy: Ice cream. Sour cream. Whole milk. Beverages: Juice. Sugar-sweetened soft drinks. Beer. Liquor and spirits. Fats and oils: Butter. Canola oil. Vegetable oil. Beef fat (tallow). Lard. Sweets and desserts: Cookies. Cakes. Pies. Candy. Seasoning and other foods: Mayonnaise. Premade sauces and marinades. The items listed  may not be a complete list. Talk with your dietitian about what dietary choices are right for you. Summary The Mediterranean diet includes both food and lifestyle choices. Eat a variety of fresh fruits and vegetables, beans, nuts, seeds, and whole grains. Limit the amount of red meat and sweets that you eat. Talk with your health care provider about whether it is safe for you to drink red wine in moderation. This means 1 glass a day for nonpregnant women and 2 glasses a day for  men. A glass of wine equals 5 oz (150 mL). This information is not intended to replace advice given to you by your health care provider. Make sure you discuss any questions you have with your health care provider. Document Released: 04/04/2016 Document Revised: 05/07/2016 Document Reviewed: 04/04/2016 Elsevier Interactive Patient Education  2017 [employer].

## 2023-08-25 DIAGNOSIS — R413 Other amnesia: Secondary | ICD-10-CM | POA: Insufficient documentation

## 2023-08-26 NOTE — Telephone Encounter (Signed)
 Appeal has been submitted for [employer] . Will advise when response is received. Please be advised that most companies may take 30 days to make a decision. Appeal letter and pertinent information was faxed to [phone] on 08/26/2023 @4 :40pm.  Thank you, Devere Pandy, PharmD Clinical Pharmacist  [place]  Direct Dial: [phone]

## 2023-08-28 NOTE — Telephone Encounter (Signed)
 Received Notification from pt's ins that they have received our request and are in the process of reviewing our request. Duplicate fax requests may delay processing time. They will reach out if additional info is needed. Request # [id number]

## 2023-08-29 NOTE — Telephone Encounter (Signed)
 Pharmacy Patient Advocate Encounter  Received notification from  [facility]  that Prior Authorization for Travis [name] has been APPROVED through 08/28/2024

## 2023-08-31 ENCOUNTER — Other Ambulatory Visit: Payer: Self-pay | Admitting: Internal Medicine

## 2023-09-01 ENCOUNTER — Other Ambulatory Visit: Payer: Self-pay

## 2023-09-01 MED ORDER — TIRZEPATIDE 5 MG/0.5ML ~~LOC~~ SOAJ: 5.0000 mg | SUBCUTANEOUS | 3 refills | Status: DC

## 2023-09-08 ENCOUNTER — Other Ambulatory Visit (HOSPITAL_COMMUNITY): Payer: Self-pay

## 2023-09-10 ENCOUNTER — Encounter (INDEPENDENT_AMBULATORY_CARE_PROVIDER_SITE_OTHER): Payer: Self-pay | Admitting: *Deleted

## 2023-09-29 ENCOUNTER — Other Ambulatory Visit: Payer: Self-pay

## 2023-09-29 ENCOUNTER — Emergency Department (HOSPITAL_COMMUNITY)
Admission: EM | Admit: 2023-09-29 | Discharge: 2023-09-29 | Disposition: A | Discharge status: Home / Self Care | Payer: BC Managed Care – PPO | Attending: Emergency Medicine | Admitting: Emergency Medicine

## 2023-09-29 DIAGNOSIS — Z794 Long term (current) use of insulin: Secondary | ICD-10-CM | POA: Insufficient documentation

## 2023-09-29 DIAGNOSIS — I10 Essential (primary) hypertension: Secondary | ICD-10-CM | POA: Diagnosis not present

## 2023-09-29 DIAGNOSIS — Z7982 Long term (current) use of aspirin: Secondary | ICD-10-CM | POA: Insufficient documentation

## 2023-09-29 DIAGNOSIS — M25562 Pain in left knee: Secondary | ICD-10-CM | POA: Insufficient documentation

## 2023-09-29 DIAGNOSIS — M25532 Pain in left wrist: Secondary | ICD-10-CM | POA: Insufficient documentation

## 2023-09-29 DIAGNOSIS — M25561 Pain in right knee: Secondary | ICD-10-CM | POA: Insufficient documentation

## 2023-09-29 DIAGNOSIS — M7989 Other specified soft tissue disorders: Secondary | ICD-10-CM | POA: Insufficient documentation

## 2023-09-29 DIAGNOSIS — Z720 Tobacco use: Secondary | ICD-10-CM | POA: Diagnosis not present

## 2023-09-29 DIAGNOSIS — Z79899 Other long term (current) drug therapy: Secondary | ICD-10-CM | POA: Diagnosis not present

## 2023-09-29 MED ORDER — OXYCODONE-ACETAMINOPHEN 5-325 MG PO TABS
1.0000 | ORAL_TABLET | Freq: Once | ORAL | Status: AC
Start: 2023-09-29 — End: 2023-09-29
  Administered 2023-09-29: 1 via ORAL
  Filled 2023-09-29: qty 1

## 2023-09-29 MED ORDER — ONDANSETRON 4 MG PO TBDP
4.0000 mg | ORAL_TABLET | Freq: Once | ORAL | Status: DC
  Filled 2023-09-29: qty 1

## 2023-09-29 MED ORDER — SODIUM CHLORIDE 0.9 % IV BOLUS
500.0000 mL | Freq: Once | INTRAVENOUS | Status: AC
  Administered 2023-09-29: 500 mL via INTRAVENOUS

## 2023-09-29 NOTE — ED Triage Notes (Signed)
Patient to ED by POV with c/o bilateral knee pain and left wrist pain. Per patient wife he was lifting a tire 3 days ago and he injured his back but unknown cause of wrist pain. He was seen by PCP twice and was given pain meds and keflex.

## 2023-09-29 NOTE — Progress Notes (Signed)
Orthopedic Tech Progress Note Patient Details:  Bianca R [place] August 05, 1961 [id number]  Ortho Devices Type of Ortho Device: Knee Sleeve Ortho Device/Splint Location: LLE Ortho Device/Splint Interventions: Ordered, Application, Adjustment   Post Interventions Patient Tolerated: Fair Instructions Provided: Care of device, Adjustment of device  [place] A Gerilyn Pilgrim 09/29/2023, 12:41 PM

## 2023-09-29 NOTE — Discharge Instructions (Signed)
It was a pleasure caring for you today. Please follow up with your outpatient ortho providers. Seek emergency care if experiencing any new or worsening symptoms.   Alternating between 650 mg Tylenol and 400 mg Advil: The best way to alternate taking Acetaminophen (example Tylenol) and Ibuprofen (example Advil/Motrin) is to take them 3 hours apart. For example, if you take ibuprofen at 6 am you can then take Tylenol at 9 am. You can continue this regimen throughout the day, making sure you do not exceed the recommended maximum dose for each drug.

## 2023-09-29 NOTE — ED Provider Notes (Signed)
[place] EMERGENCY DEPARTMENT AT [facility] Provider Note   CSN: [id number] Arrival date & time: 09/29/23  1101     History  Chief Complaint  Patient presents with   Knee Pain   Wrist Pain    Travis [name] is a 63 y.o. male with PMHx arthritis, HLD, HTN, schizoaffective disorder, tob use disorder who presents to ED concerned for BL knee pain and left wrist pain. No recent falls. Patient seen by ortho provider 1 month ago to have some fluid drained off his left knee. 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 Also with hx of recurrent left wrist pain and was diagnosed with tendonitis in this left wrist by his ortho provider and sent home with wrist brace. Family member at bedside stating that this is recurrent issues and that they are not worried about fractures since patient has had no recent trauma. Patient stating that it hurts a lot to walk.   Denies fever, chest pain, nausea, vomiting, diarrhea.    Knee Pain Wrist Pain       Home Medications Prior to Admission medications   Medication Sig Start Date End Date Taking? Authorizing Provider  amLODipine (NORVASC) 10 MG tablet Take 1 tablet (10 mg total) by mouth daily. For high blood pressure 10/15/18   Armandina Stammer I, NP  amoxicillin-clavulanate (AUGMENTIN) 875-125 MG tablet Take 1 tablet by mouth 2 (two) times daily. 08/12/23   [provider]  aspirin-acetaminophen-caffeine (EXCEDRIN EXTRA STRENGTH) [id number] MG tablet Take 1 tablet by mouth in the morning and at bedtime.    [provider]  atorvastatin (LIPITOR) 20 MG tablet Take 1 tablet (20 mg total) by mouth daily. For high cholesterol Patient taking differently: Take 20 mg by mouth every evening. For high cholesterol 10/15/18   Armandina Stammer I, NP  Blood Glucose Monitoring Suppl (ACCU-CHEK GUIDE) w/Device KIT 1 Device by Does not apply route daily in the afternoon. 12/21/21   Shamleffer, Konrad Dolores, MD  carbamazepine  (TEGRETOL) 200 MG tablet Take 200 mg by mouth 2 (two) times daily. 09/28/20   [provider]  Continuous Glucose Sensor (FREESTYLE LIBRE 3 SENSOR) MISC Use 1 device  every 14 (fourteen) days as directed. 05/27/23   Shamleffer, Konrad Dolores, MD  empagliflozin (JARDIANCE) 25 MG TABS tablet Take 1 tablet (25 mg total) by mouth as directed. Half a tablet daily Patient taking differently: Take 12.5 mg by mouth as directed. Half a tablet daily 01/30/23   Shamleffer, Konrad Dolores, MD  FEROSUL 325 (65 Fe) MG tablet Take 325 mg by mouth 3 (three) times daily. 07/22/23   [provider]  FLUoxetine (PROZAC) 20 MG capsule Take 60 mg by mouth every morning. 10/05/20   [provider]  FLUoxetine (PROZAC) 40 MG capsule TAKE ONE CAPSULE BY MOUTH DAILY along with THE 20 MG FOR a total of 60 MG    [provider]  furosemide (LASIX) 20 MG tablet Take 20 mg by mouth as needed. 10/12/20   [provider]  gemfibrozil (LOPID) 600 MG tablet Take 600 mg by mouth 2 (two) times daily.    [provider]  glipiZIDE (GLUCOTROL) 5 MG tablet TAKE 1 TABLET BY MOUTH TWICE DAILY BEFORE A MEAL 09/01/23   Shamleffer, Konrad Dolores, MD  glucose blood (ACCU-CHEK GUIDE) test strip 1 each by Other route daily. Use as instructed 12/21/21   Shamleffer, Konrad Dolores, MD  hydrochlorothiazide (HYDRODIURIL) 25 MG tablet Take 25 mg by mouth every  evening. 10/16/21   [provider]  HYDROcodone-acetaminophen (NORCO/VICODIN) 5-325 MG tablet Take 1 tablet by mouth every 6 (six) hours as needed. Patient not taking: Reported on 08/22/2023 02/07/23   [provider]  hydrOXYzine (ATARAX) 25 MG tablet TAKE 1 TABLET BY MOUTH DAILY AS NEEDED FOR ANXIETY OR SLEEP    [provider]  ibuprofen (ADVIL) 600 MG tablet Take 600 mg by mouth every 6 (six) hours as needed. Patient not taking: Reported on 08/22/2023 02/07/23   [provider]  indomethacin (INDOCIN) 25 MG  capsule Take 25 mg by mouth daily as needed for mild pain or moderate pain.    [provider]  insulin glargine, 2 Unit Dial, (TOUJEO MAX SOLOSTAR) 300 UNIT/ML Solostar Pen Inject 45 Units into the skin daily in the afternoon. 08/29/22   Shamleffer, Konrad Dolores, MD  Insulin Pen Needle (BD PEN NEEDLE NANO 2ND GEN) 32G X 4 MM MISC USE DAILY TO inject lantus 12/26/22   Shamleffer, Konrad Dolores, MD  KLOR-CON M20 20 MEQ tablet Take 20 mEq by mouth as needed. 09/28/20   [provider]  losartan (COZAAR) 100 MG tablet Take 50 mg by mouth daily. 10/17/21   [provider]  meclizine (ANTIVERT) 25 MG tablet TAKE 1 TABLET BY MOUTH FOUR TIMES DAILY AS NEEDED FOR dizziness    [provider]  meloxicam (MOBIC) 7.5 MG tablet Take 1 tablet by mouth daily. Patient not taking: Reported on 08/22/2023    [provider]  metFORMIN (GLUCOPHAGE) 1000 MG tablet Take 1 tablet (1,000 mg total) by mouth 2 (two) times daily with a meal. 11/28/21   Shamleffer, Konrad Dolores, MD  molnupiravir EUA (LAGEVRIO) 200 MG CAPS capsule TAKE FOUR CAPSULES BY MOUTH EVERY TWELVE HOURS FOR 5 DAYS Patient not taking: Reported on 08/22/2023    [provider]  nitroGLYCERIN (NITROSTAT) 0.4 MG SL tablet Place 1 tablet (0.4 mg total) under the tongue every 5 (five) minutes as needed for chest pain. 10/15/18   Armandina Stammer I, NP  pantoprazole (PROTONIX) 40 MG tablet Take 1 tablet (40 mg total) by mouth daily. For acid reflux Patient taking differently: Take 40 mg by mouth at bedtime. For acid reflux 10/15/18   Armandina Stammer I, NP  pramipexole (MIRAPEX) 1.5 MG tablet Take 1.5 mg by mouth at bedtime.    [provider]  tamsulosin (FLOMAX) 0.4 MG CAPS capsule Take 1 capsule (0.4 mg total) by mouth daily after breakfast. For prostate health 10/15/18   Armandina Stammer I, NP  tirzepatide [facility]) 5 MG/0.5ML Pen Inject 5 mg into the skin once a week. 09/01/23   Shamleffer, Konrad Dolores, MD   traMADol (ULTRAM) 50 MG tablet Take 1 tablet by mouth every 6 (six) hours.    [provider]  traZODone (DESYREL) 100 MG tablet Take 1 tablet (100 mg total) by mouth at bedtime. For sleep 10/15/18   Armandina Stammer I, NP  traZODone (DESYREL) 150 MG tablet TAKE 1 TABLET BY MOUTH AT BEDTIME AS NEEDED INSOMNIA Patient not taking: Reported on 08/22/2023    [provider]  Vitamin D, Ergocalciferol, (DRISDOL) 1.25 MG (50000 UNIT) CAPS capsule Take 50,000 Units by mouth once a week. 07/14/23   [provider]      Allergies    Doxycycline, Gabapentin, Methocarbamol, Lisinopril, Lyrica [pregabalin], and Promethazine    Review of Systems   Review of Systems  Musculoskeletal:        Joint pain    Physical Exam  Updated Vital Signs BP 108/68 (BP Location: Left Arm)   Pulse (!) 115   Temp 98.4 F (36.9 C) (Oral)   Resp 18   Ht 5\' 5"  (1.651 m)   Wt 80.3 kg   SpO2 93%   BMI 29.45 kg/m  Physical Exam Vitals and nursing note reviewed.  Constitutional:      General: He is not in acute distress.    Appearance: He is not ill-appearing or toxic-appearing.  HENT:     Head: Normocephalic and atraumatic.     Mouth/Throat:     Mouth: Mucous membranes are moist.  Eyes:     General: No scleral icterus.       Right eye: No discharge.        Left eye: No discharge.     Conjunctiva/sclera: Conjunctivae normal.  Cardiovascular:     Rate and Rhythm: Normal rate.     Pulses: Normal pulses.  Pulmonary:     Effort: Pulmonary effort is normal.  Musculoskeletal:     Right lower leg: No edema.     Left lower leg: No edema.     Comments: Small area of swelling of left upper medial knee palpated without obvious visual changes. No erythema or increased warmth of BL knees or wrist. ROM mild-moderately restricted dt pain. +2 radial and pedal pulses.   Skin:    General: Skin is warm and dry.     Findings: No rash.  Neurological:     General: No focal deficit present.      Mental Status: He is alert and oriented to person, place, and time. Mental status is at baseline.  Psychiatric:        Mood and Affect: Mood normal.     ED Results / Procedures / Treatments   Labs (all labs ordered are listed, but only abnormal results are displayed) Labs Reviewed - No data to display  EKG None  Radiology No results found.  Procedures Procedures    Medications Ordered in ED Medications  ondansetron (ZOFRAN-ODT) disintegrating tablet 4 mg (4 mg Oral Patient Refused/Not Given 09/29/23 1331)  oxyCODONE-acetaminophen (PERCOCET/ROXICET) 5-325 MG per tablet 1 tablet (1 tablet Oral Given 09/29/23 1230)  sodium chloride 0.9 % bolus 500 mL (0 mLs Intravenous Stopped 09/29/23 1426)    ED Course/ Medical Decision Making/ A&P                                 Medical Decision Making  This patient presents to the ED for concern of knee and wrist pain, this involves an extensive number of treatment options, and is a complaint that carries with it a high risk of complications and morbidity.  The differential diagnosis includes hemarthrosis, gout, septic joint, fracture, tendonitis, carpal tunnel syndrome, muscle strain, bursitis, compartment syndrome   Co morbidities that complicate the patient evaluation  arthritis, HLD, HTN, schizoaffective disorder, tob use disorder   Additional history obtained:  Dr. Sherwood Gambler PCP   Problem List / ED Course / Critical interventions / Medication management  Patient presents to ED for chronic BL knee and wrist pain. Has been evaluated by his ortho provider in the past and received xrays in the past. Refusing further imaging today. Denies any infectious symptom. Physical exam reassuring. Patient initially tachycardic and mildly hypotensive. Patient stating that he has not been drinking fluids at home because the pain is making it difficult to walk to the bathroom. Educated patient on  the risks of dehydration and offered him some IV fluids  while he is in the ED. Patient also drinking sprite and water in ED. I provided patient with a dose of percocet in ED which he tolerated well - pain is resolving. Patient also interested in knee brace so I have provided this as well. Offered patient wrist brace - patient denied stating that he already has a wrist splint at home.  Patient with joint effusion in the past drained by his ortho provider. Educated patient that I will not be able to drain his joint effusion today and that he will need to follow up with his ortho provider. Patient and family member verbalized understanding of plan.  After IV fluids, patient still tachycardic. Talked with patient and family member at bedside who states that patient's HR is constantly in the 110's and he has had cardiology workup for this finding in the past. Patient without fever or signs of infection on physical exam, so I am less concerned that his chronic tachycardia is complicated by an infectious source today. Family member also stating that patient has close follow up with PCP. Patient and family member stating that they are ready to go home.  I have reviewed the patients home medicines and have made adjustments as needed Patient afebrile with stable vitals. provided with return precautions. Discharged in good condition.   Ddx these are considered less likely due to history of present illness and physical exam -gout: no warmth or erythema; ROM intact  -septic joint: afebrile; no warmth or erythema; no skin changes; ROM intact  -fracture: no recent trauma -compartment syndrome: area not tense; neurovascularly intact   Social Determinants of Health:  none         Final Clinical Impression(s) / ED Diagnoses Final diagnoses:  Left wrist pain  Acute pain of both knees    Rx / DC Orders ED Discharge Orders     None         Dorthy Cooler, [place] 09/29/23 1443    Linwood Dibbles, MD 09/29/23 1904

## 2023-10-06 ENCOUNTER — Ambulatory Visit (INDEPENDENT_AMBULATORY_CARE_PROVIDER_SITE_OTHER): Payer: BC Managed Care – PPO | Admitting: Gastroenterology

## 2023-10-12 ENCOUNTER — Other Ambulatory Visit: Payer: Self-pay | Admitting: Internal Medicine

## 2023-10-22 ENCOUNTER — Encounter: Payer: Self-pay | Admitting: Internal Medicine

## 2023-11-06 ENCOUNTER — Ambulatory Visit

## 2023-11-06 ENCOUNTER — Encounter: Payer: Self-pay | Admitting: Internal Medicine

## 2023-11-06 ENCOUNTER — Ambulatory Visit: Attending: Internal Medicine | Admitting: Internal Medicine

## 2023-11-06 VITALS — BP 115/76 | HR 106 | Ht 64.75 in | Wt 161.0 lb

## 2023-11-06 DIAGNOSIS — M130 Polyarthritis, unspecified: Secondary | ICD-10-CM

## 2023-11-06 DIAGNOSIS — R899 Unspecified abnormal finding in specimens from other organs, systems and tissues: Secondary | ICD-10-CM

## 2023-11-06 DIAGNOSIS — M25532 Pain in left wrist: Secondary | ICD-10-CM | POA: Diagnosis not present

## 2023-11-06 DIAGNOSIS — M25531 Pain in right wrist: Secondary | ICD-10-CM | POA: Diagnosis not present

## 2023-11-06 NOTE — Progress Notes (Unsigned)
 Office Visit Note  Patient: Travis [name]             Date of Birth: 01/13/1961           MRN: [id number]             PCP: Elfredia Nevins, MD Referring: Tama Gander Visit Date: 11/06/2023   Subjective:  New Patient (Initial Visit) (Patient states he has labs done recently (the labs are in the [facility] tab). Patient states he took a shot for his diabetes, he believes the shot triggered something and he could not move his body. Patient states he is having pain all over. Patient states he has random spots on he does not know what they are. )    Discussed the use of AI scribe software for clinical note transcription with the patient, who gave verbal consent to proceed.  History of Present Illness   Travis [name] is a 63 y.o. male here for evaluation of with a recent increase in joint pain and swelling. He is accompanied by his wife, Travis [name]. He was referred for evaluation of a recent increase in joint pain and swelling.  He has experienced a significant increase in joint pain and swelling, particularly affecting his knees, shoulders, and hips, which has severely impacted his mobility. This exacerbation began at the end of last month, leading to a hospital visit due to severe symptoms. He describes the pain as severe, stating 'I just couldn't move' and 'it was bad pain.' He was treated with oral steroids, which helped reduce the pain and swelling, and he is currently on a prednisone taper. He has been using a walker since his hospital discharge. Significant swelling in his joints was noted during the recent exacerbation.  He has a history of skin rashes that began as small circles about a year ago and have since spread to various parts of his body, including his legs and belly. The rashes are described as itchy but not painful, and they sometimes scale up and feel like 'a piece of sandpaper.' He has been using triamcinolone cream, which was prescribed by his primary care doctor, but  it has not been very effective. The rashes were noted to have improved with vancomycin treatment during his hospital stay but have since returned.  He has a history of back issues, including two ruptured discs, which were exacerbated by lifting a tire. He also reports a history of knee replacement and ongoing issues with his other knee, which is bone-on-bone and sometimes grinds.  He has a history of gout-like arthritis, with a previous episode following knee replacement surgery three years ago. He recalls his ankle swelling significantly during that time, and a doctor suggested it was gout-like arthritis despite normal uric acid levels.  He has a history of working at a landfill, operating heavy equipment, which may have contributed to his joint issues.  He has also had [facility] spotted fever in the past, as he is frequently outdoors and prone to tick bites.      Labs reviewed 09/2023 RF neg CRP 210.6 Knee aspirate 2,490 WBCs Calcium Pyrophosphate Dihydrate Crystals +  Activities of Daily Living:  Patient reports morning stiffness for 5 minutes.   Patient Reports nocturnal pain.  Difficulty dressing/grooming: Denies Difficulty climbing stairs: Reports Difficulty getting out of chair: Reports Difficulty using hands for taps, buttons, cutlery, and/or writing: Reports  Review of Systems  Constitutional:  Positive for fatigue.  HENT:  Positive for mouth dryness. Negative  for mouth sores.   Eyes:  Negative for dryness.  Respiratory:  Negative for shortness of breath.   Cardiovascular:  Negative for chest pain and palpitations.  Gastrointestinal:  Positive for blood in stool and constipation. Negative for diarrhea.  Endocrine: Negative for increased urination.  Genitourinary:  Negative for involuntary urination.  Musculoskeletal:  Positive for joint pain, gait problem, joint pain, joint swelling, myalgias, muscle weakness, morning stiffness, muscle tenderness and myalgias.  Skin:   Positive for color change and rash. Negative for hair loss and sensitivity to sunlight.  Allergic/Immunologic: Negative for susceptible to infections.  Neurological:  Positive for dizziness. Negative for headaches.  Hematological:  Negative for swollen glands.  Psychiatric/Behavioral:  Positive for sleep disturbance. Negative for depressed mood. The patient is not nervous/anxious.     PMFS History:  Patient Active Problem List   Diagnosis Date Noted   Polyarthritis 11/06/2023   Abnormal laboratory test result 11/06/2023   Memory impairment 08/25/2023   Type 2 diabetes mellitus with diabetic polyneuropathy, with long-term current use of insulin (HCC) 11/28/2021   Type 2 diabetes mellitus with hyperglycemia, without long-term current use of insulin (HCC) 11/28/2021   OA (osteoarthritis) of knee 10/30/2020   Primary osteoarthritis of right knee 10/30/2020   Major depressive disorder, recurrent episode, severe, with psychosis (HCC) 04/19/2017   MDD (major depressive disorder) 04/16/2017   AKI (acute kidney injury) (HCC)    Lactic acid acidosis    Left sided numbness    Sepsis (HCC)    Elevated lactic acid level    Leukocytosis    Volume depletion    Hypotension 03/13/2017   Chest pain with moderate risk for cardiac etiology 01/13/2014   Dyspnea 01/13/2014   Family history of coronary artery disease- (mother had MI 92) 01/13/2014   Tobacco chew use    Type 2 diabetes mellitus (HCC)    Dyslipidemia    Vitamin D deficiency    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 pylori infection 09/15/2006   Hematochezia 08/30/2007   History of nuclear stress test 04/03/2009   exercise myoview; normal pattern of  perfusion; low risk scan    Hypertension    treated   Kidney stones    NIDDM (non-insulin dependent diabetes mellitus) 2011   type 2   Schizoaffective disorder (HCC)    Tobacco chew use    Unspecified vitamin D deficiency     Family History  Problem Relation Age of Onset   Stroke Father 22   Pneumonia Father    Heart failure Mother 55   Coronary artery disease Mother        also MI   Heart attack Mother    Heart attack Maternal Uncle        died in 66s   Cancer Other        x2; maternal side   Diabetes Maternal Grandmother    Past Surgical History:  Procedure Laterality Date   APPENDECTOMY     HERNIA REPAIR     RIGHT   KNEE SURGERY     BOTH   TOTAL KNEE ARTHROPLASTY Right 10/30/2020   Procedure: TOTAL KNEE ARTHROPLASTY;  Surgeon: Ollen Gross, MD;  Location: [facility];  Service: Orthopedics;  Laterality: Right;    TRANSTHORACIC ECHOCARDIOGRAM  04/03/2009   EF=>55%; trace TR, mild pulm regurg   Social History   Social History Narrative   Are you right handed or left handed? Right   Are you currently employed ?    What is your current occupation? retired   Do you live at home alone?   Who lives with you? wife    What type of home do you live in: 1 story or 2 story? one    Caffiene 1 cup    There is no immunization history on file for this patient.   Objective: Vital Signs: BP 115/76 (BP Location: Right Arm, Patient Position: Sitting, Cuff Size: Normal)   Pulse (!) 106   Ht 5' 4.75" (1.645 m)   Wt 161 lb (73 kg)   BMI 27.00 kg/m    Physical Exam   Musculoskeletal Exam: ***    B/l wrist pain, dcreased ROM especially extension Shoulders guarding, no focla tenderness Left knee limited ROM with anterior pain, also tender to pressure joint line and posterior Pred taper currently at 5 mg until saturday ***  Investigation: No additional findings.  Imaging: XR Wrist 2 Views Right Result Date: 11/06/2023 X-ray right wrist 2 views Radiocarpal joint space  appears normal.  Chondrocalcinosis present in TFCC.  Cystic changes present in the carpal bones without definite erosions or joint space loss.  Minimal degenerative changes of first CMC joint.  Bone mineralization appears normal. 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  Chondrocalcinosis present in TFCC.  Cystic changes present in the carpal bones without definite erosions or joint space loss.  Minimal degenerative changes of first CMC joint.  Bone mineralization appears normal. 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  Procedures:  No procedures performed Allergies: Doxycycline, Gabapentin, Methocarbamol, Lisinopril, Lyrica [pregabalin], and Promethazine   Assessment / Plan:     Visit Diagnoses: Polyarthritis - Plan: XR Wrist 2 Views Right, XR Wrist 2 Views Left, Lyme Disease Serology w/Reflex, CYCLIC CITRUL PEPTIDE ANTIBODY, IGG/IGA, RPR, C-reactive protein, ANA,IFA RA Diag Pnl w/rflx Tit/Patn  Abnormal laboratory test result - Plan: Lyme Disease Serology w/Reflex, CYCLIC CITRUL PEPTIDE ANTIBODY, IGG/IGA, RPR, C-reactive protein, ANA,IFA RA Diag Pnl w/rflx Tit/Patn  Orders: Orders Placed This Encounter  Procedures   XR Wrist 2 Views Right   XR Wrist 2 Views Left    Lyme Disease Serology w/Reflex   CYCLIC CITRUL PEPTIDE ANTIBODY, IGG/IGA   RPR   C-reactive protein   ANA,IFA RA Diag Pnl w/rflx Tit/Patn   No orders of the defined types were placed in this encounter.   Face-to-face time spent with patient was ***  minutes. Greater than 50% of time was spent in counseling and coordination of care.  Follow-Up Instructions: No follow-ups on file.   Fuller Plan, MD  Note - This record has been created using [employer].  Chart creation errors have been sought, but may not always  have been located. Such creation errors do not reflect on  the standard of medical care.

## 2023-11-11 ENCOUNTER — Encounter: Payer: Self-pay | Admitting: Internal Medicine

## 2023-11-11 DIAGNOSIS — M1120 Other chondrocalcinosis, unspecified site: Secondary | ICD-10-CM

## 2023-11-12 ENCOUNTER — Encounter (INDEPENDENT_AMBULATORY_CARE_PROVIDER_SITE_OTHER): Payer: Self-pay | Admitting: Gastroenterology

## 2023-11-12 ENCOUNTER — Ambulatory Visit (INDEPENDENT_AMBULATORY_CARE_PROVIDER_SITE_OTHER): Payer: BC Managed Care – PPO | Admitting: Gastroenterology

## 2023-11-12 VITALS — BP 102/64 | HR 132 | Temp 98.6°F | Ht 64.0 in | Wt 161.0 lb

## 2023-11-12 DIAGNOSIS — K641 Second degree hemorrhoids: Secondary | ICD-10-CM

## 2023-11-12 DIAGNOSIS — K5904 Chronic idiopathic constipation: Secondary | ICD-10-CM

## 2023-11-12 DIAGNOSIS — D509 Iron deficiency anemia, unspecified: Secondary | ICD-10-CM

## 2023-11-12 DIAGNOSIS — K5900 Constipation, unspecified: Secondary | ICD-10-CM | POA: Diagnosis not present

## 2023-11-12 DIAGNOSIS — K581 Irritable bowel syndrome with constipation: Secondary | ICD-10-CM | POA: Insufficient documentation

## 2023-11-12 DIAGNOSIS — R109 Unspecified abdominal pain: Secondary | ICD-10-CM | POA: Diagnosis not present

## 2023-11-12 DIAGNOSIS — Z860100 Personal history of colon polyps, unspecified: Secondary | ICD-10-CM | POA: Insufficient documentation

## 2023-11-12 DIAGNOSIS — D500 Iron deficiency anemia secondary to blood loss (chronic): Secondary | ICD-10-CM

## 2023-11-12 MED ORDER — HYDROCORTISONE ACETATE 25 MG RE SUPP: 25.0000 mg | Freq: Two times a day (BID) | RECTAL | 0 refills | Status: AC

## 2023-11-12 MED ORDER — POLYETHYLENE GLYCOL 3350 17 G PO PACK: 17.0000 g | PACK | Freq: Two times a day (BID) | ORAL | 0 refills | Status: AC

## 2023-11-12 MED ORDER — PEG 3350-KCL-NA BICARB-NACL 420 G PO SOLR: 4000.0000 mL | Freq: Once | ORAL | 0 refills | Status: AC

## 2023-11-12 MED ORDER — PSYLLIUM 58.6 % PO PACK: 1.0000 | PACK | Freq: Two times a day (BID) | ORAL | 2 refills | Status: AC

## 2023-11-12 NOTE — Telephone Encounter (Signed)
 Patient's wife, Juliette Alcide, contacted the office again today and states the patient is in a lot of pain. Juliette Alcide states the patient finished his prednisone Sunday. Juliette Alcide states the patient has pain and swelling all over. Juliette Alcide and the patient states that he has pain and swelling in the left knee and pain in the bottoms of his feet, wrist, elbows, ankles, and all over in general. Juliette Alcide states the patient does not have a follow up scheduled because they were waiting on the lab results. Please advise.

## 2023-11-12 NOTE — Patient Instructions (Signed)
 It was very nice to meet you today, as dicussed with will plan for the following :  1) Upper endoscopy and Colonoscopy  2) Low FODMAP diet  3)Ensure adequate fluid intake: Aim for 8 glasses of water daily. Take Miralax twice a day for the first week, then reduce to once daily thereafter. Use Metamucil twice a day. Linzess starting week2   4) IB Guard 1-2 times day

## 2023-11-12 NOTE — Progress Notes (Signed)
 Travis [name] , M.D. Gastroenterology & Hepatology [facility] Gastroenter[facility] [address] [place], [place] [address] Primary Care Physician: Travis Nevins, MD [address] [place] [address]  Chief Complaint: Abdominal pain, history of colonic polyps and iron deficiency anemia  History of Present Illness: Travis [name] is a 63 y.o. male with diabetes, hypertension, gout who presents for evaluation of Abdominal pain, history of colonic polyps and iron deficiency anemia  Patient reports abdominal pain mostly located lower abdomen rated without any relationship with food intake or defecation.  Patient feels very bloated and distended.  Patient reports chronic constipation with history of stool staining 1 to 3 days with straining.  Previously patient used to sleep 11 stools evaluated by neurology likely contributing. The patient denies having any nausea, vomiting, fever, chills, melena, hematemesis,  diarrhea, jaundice, pruritus or weight loss.  Last [id number]  - Esophageal mucosal changes suspicious for short- segment Barrett' s esophagus. Biopsied. - Normal stomach. - Normal examined duodenum.  Last Colonoscopy:2019  - The perianal and digital rectal examinations were normal. - A 14 mm polyp was found in the transverse colon. The polyp was multi- lobulated, ulcerated and pedunculated. The polyp was removed with a hot snare. Resection and retrieval were complete. - A 10 mm polyp was found in the transverse colon. The polyp was pedunculated. The polyp was removed with a hot snare. Resection and retrieval were complete. - A few small- mouthed diverticula were found in the sigmoid colon and descending colon. - Non- bleeding internal hemorrhoids were found during retroflexion. The hemorrhoids were small.  Repeat 3 years  Path  1. Surgical [P], GE junction - GASTROESOPHAGEAL MUCOSA WITH SLIGHT INFLAMMATION CONSISTENT WITH REFLUX. - NO INTESTINAL  METAPLASIA, DYSPLASIA, OR MALIGNANCY. 2. Surgical [P], transverse, polyp (2) - TUBULAR ADENOMA (TWO FRAGMENTS). - BENIGN COLONIC MUCOSA WITH BENIGN LYMPHOID AGGREGATE (TWO FRAGMENTS). - NO HIGH GRADE DYSPLASIA OR MALIGNANCY.  FHx: neg for any gastrointestinal/liver disease, no malignancies Social: neg smoking, alcohol or illicit drug use Surgical: Appendectomy and hernia repair  Last hemoglobin 12.9 MCV 78 platelet 189 normal liver enzymes creatinine 1.0 Iron saturation 5 TIBC 454  Past Medical History: Past Medical History:  Diagnosis Date   Arthritis    Dyslipidemia    Dyspnea    On exertion   Fatigue    NAUSEA,BLOATING   H. pylori infection 09/15/2006   Hematochezia 08/30/2007   History of nuclear stress test 04/03/2009   exercise myoview; normal pattern of perfusion; low risk scan    Hypertension    treated   Kidney stones    NIDDM (non-insulin dependent diabetes mellitus) 2011   type 2   Schizoaffective disorder (HCC)    Tobacco chew use    Unspecified vitamin D deficiency     Past Surgical History: Past Surgical History:  Procedure Laterality Date   APPENDECTOMY     HERNIA REPAIR     RIGHT   KNEE SURGERY     BOTH   TOTAL KNEE ARTHROPLASTY Right 10/30/2020   Procedure: TOTAL KNEE ARTHROPLASTY;  Surgeon: Travis Gross, MD;  Location: [facility];  Service: Orthopedics;  Laterality: Right;    TRANSTHORACIC ECHOCARDIOGRAM  04/03/2009   EF=>55%; trace TR, mild pulm regurg    Family History: Family History  Problem Relation Age of Onset   Stroke Father 76   Pneumonia Father    Heart failure Mother 79   Coronary artery disease Mother        also  MI   Heart attack Mother    Heart attack Maternal Uncle        died in 7s   Cancer Other        x2; maternal side   Diabetes Maternal Grandmother     Social History: Social History   Tobacco Use  Smoking Status Never   Passive exposure: Past  Smokeless Tobacco Former   Types: Snuff, Chew   Quit date:  10/26/2020  Tobacco Comments   started chewing tobacco at age 57 years old-stopped chewing and dips snuff only stopped dipping   Social History   Substance and Sexual Activity  Alcohol Use No   Alcohol/week: 0.0 standard drinks of alcohol   Social History   Substance and Sexual Activity  Drug Use No    Allergies: Allergies  Allergen Reactions   Doxycycline Other (See Comments)    Reaction:  Blisters    Gabapentin     Other reaction(s): Hallucinations, Lightheadedness   Methocarbamol     Other reaction(s): Confusion, Hallucinations   Lisinopril Swelling   Lyrica [Pregabalin]    Promethazine     Hallucinations    Medications: Current Outpatient Medications  Medication Sig Dispense Refill   amLODipine (NORVASC) 10 MG tablet Take 1 tablet (10 mg total) by mouth daily. For high blood pressure 10 tablet 0   aspirin-acetaminophen-caffeine (EXCEDRIN EXTRA STRENGTH) 250-250-65 MG tablet Take 1 tablet by mouth in the morning and at bedtime.     atorvastatin (LIPITOR) 20 MG tablet Take 1 tablet (20 mg total) by mouth daily. For high cholesterol (Patient taking differently: Take 20 mg by mouth every evening. For high cholesterol) 7 tablet 0   carbamazepine (TEGRETOL) 200 MG tablet Take 200 mg by mouth 2 (two) times daily.     cetirizine (ZYRTEC) 10 MG tablet Take 1 tablet by mouth daily.     Continuous Glucose Receiver (FREESTYLE LIBRE 2 READER) DEVI by Does not apply route.     Continuous Glucose Sensor (FREESTYLE LIBRE 3 SENSOR) MISC Use 1 device  every 14 (fourteen) days as directed. 6 each 3   cyclobenzaprine (FLEXERIL) 5 MG tablet Take by mouth.     empagliflozin (JARDIANCE) 25 MG TABS tablet Half a tablet daily 90 tablet 1   FEROSUL 325 (65 Fe) MG tablet Take 325 mg by mouth 2 (two) times daily with a meal.     FLUoxetine (PROZAC) 20 MG capsule Take 60 mg by mouth every morning.     FLUoxetine (PROZAC) 40 MG capsule TAKE ONE CAPSULE BY MOUTH DAILY along with THE 20 MG FOR a total  of 60 MG     furosemide (LASIX) 20 MG tablet Take 20 mg by mouth as needed.     gemfibrozil (LOPID) 600 MG tablet Take 600 mg by mouth 2 (two) times daily.     glipiZIDE (GLUCOTROL) 5 MG tablet TAKE 1 TABLET BY MOUTH TWICE DAILY BEFORE A MEAL 180 tablet 1   hydrochlorothiazide (HYDRODIURIL) 25 MG tablet Take 25 mg by mouth every evening.     HYDROcodone-acetaminophen (NORCO) 10-325 MG tablet Take 1 tablet by mouth every 8 (eight) hours as needed.     hydrOXYzine (ATARAX) 25 MG tablet TAKE 1 TABLET BY MOUTH DAILY AS NEEDED FOR ANXIETY OR SLEEP     Insulin Pen Needle (BD PEN NEEDLE NANO 2ND GEN) 32G X 4 MM MISC USE DAILY TO inject lantus 100 each 3   KLOR-CON M20 20 MEQ tablet Take 20 mEq by mouth as needed.  meclizine (ANTIVERT) 25 MG tablet TAKE 1 TABLET BY MOUTH FOUR TIMES DAILY AS NEEDED FOR dizziness     metFORMIN (GLUCOPHAGE) 1000 MG tablet Take 1 tablet (1,000 mg total) by mouth 2 (two) times daily with a meal. 180 tablet 3   nitroGLYCERIN (NITROSTAT) 0.4 MG SL tablet Place 1 tablet (0.4 mg total) under the tongue every 5 (five) minutes as needed for chest pain.  0   ondansetron (ZOFRAN) 4 MG tablet Take 4-8 mg by mouth every 6 (six) hours.     pantoprazole (PROTONIX) 40 MG tablet Take 1 tablet (40 mg total) by mouth daily. For acid reflux (Patient taking differently: Take 40 mg by mouth at bedtime. For acid reflux) 10 tablet 0   pramipexole (MIRAPEX) 1.5 MG tablet Take 1.5 mg by mouth at bedtime.     tamsulosin (FLOMAX) 0.4 MG CAPS capsule Take 1 capsule (0.4 mg total) by mouth daily after breakfast. For prostate health 7 capsule 0   traMADol (ULTRAM) 50 MG tablet Take 1 tablet by mouth every 6 (six) hours.     traZODone (DESYREL) 100 MG tablet Take 1 tablet (100 mg total) by mouth at bedtime. For sleep 30 tablet 0   Vitamin D, Ergocalciferol, (DRISDOL) 1.25 MG (50000 UNIT) CAPS capsule Take 50,000 Units by mouth once a week.     insulin glargine, 2 Unit Dial, (TOUJEO MAX SOLOSTAR) 300  UNIT/ML Solostar Pen Inject 45 Units into the skin daily in the afternoon. (Patient not taking: Reported on 11/12/2023) 15 mL 3   meloxicam (MOBIC) 7.5 MG tablet Take 1 tablet by mouth daily. (Patient not taking: Reported on 11/12/2023)     No current facility-administered medications for this visit.    Review of Systems: GENERAL: negative for malaise, night sweats HEENT: No changes in hearing or vision, no nose bleeds or other nasal problems. NECK: Negative for lumps, goiter, pain and significant neck swelling RESPIRATORY: Negative for cough, wheezing CARDIOVASCULAR: Negative for chest pain, leg swelling, palpitations, orthopnea GI: SEE HPI MUSCULOSKELETAL: Negative for joint pain or swelling, back pain, and muscle pain. SKIN: Negative for lesions, rash HEMATOLOGY Negative for prolonged bleeding, bruising easily, and swollen nodes. ENDOCRINE: Negative for cold or heat intolerance, polyuria, polydipsia and goiter. NEURO: negative for tremor, gait imbalance, syncope and seizures. The remainder of the review of systems is noncontributory.   Physical Exam: BP 102/64 (BP Location: Left Arm, Patient Position: Sitting, Cuff Size: Normal)   Pulse (!) 132   Temp 98.6 F (37 C) (Temporal)   Ht 5\' 4"  (1.626 m)   Wt 161 lb (73 kg)   BMI 27.64 kg/m  GENERAL: The patient is AO x3, in no acute distress. HEENT: Head is normocephalic and atraumatic. EOMI are intact. Mouth is well hydrated and without lesions. NECK: Supple. No masses LUNGS: Clear to auscultation. No presence of rhonchi/wheezing/rales. Adequate chest expansion HEART: RRR, normal s1 and s2. ABDOMEN: Soft, nontender, no guarding, no peritoneal signs, and nondistended. BS +. No masses.  Imaging/Labs: as above     Latest Ref Rng & Units 03/11/2022    5:21 PM 11/08/2021    3:15 PM 11/22/2020   10:04 PM  CBC  WBC 4.0 - 10.5 K/uL 9.0  9.6  15.3   Hemoglobin 13.0 - 17.0 g/dL 62.9  52.8  41.3   Hematocrit 39.0 - 52.0 % 38.0  41.7   40.5   Platelets 150 - 400 K/uL 189  189  303    No results found for: "IRON", "TIBC", "  FERRITIN"  I personally reviewed and interpreted the available labs, imaging and endoscopic files. CT 07/2023  IMPRESSION:  No acute findings in the abdomen or pelvis.   Aortic atherosclerosis.   Mild prostate enlargement.   Small left inguinal hernia containing fat.    Impression and Plan:  Emett R [name] is a 63 y.o. male with diabetes, hypertension, gout who presents for evaluation of Abdominal pain, history of colonic polyps and iron deficiency anemia  #Abdominal pain with constipation   This is likely irritable bowel syndrome constipation type or chronic idiopathic constipation leading to abdominal discomfort  Patient reports Bristol stool scale 1-3 with straining and hard stool  This is likely exacerbated by iron supplementation  Patient had a CT scan last December and ultrasound last year without any overt pathology explaining abdominal discomfort  - Explained presumed etiology of IBS symptoms. Patient was counseled about the benefit of implementing a low FODMAP to improve symptoms and recurrent episodes. A dietary list was provided to the patient. Also, the patient was counseled about the benefit of avoiding stressing situations and potential environmental triggers leading to symptomatology.  - Start IBGard 1 tablet every 8-12 hours as needed -Ensure adequate fluid intake: Aim for 8 glasses of water daily. -Take Miralax twice a day for the first week, then reduce to once daily thereafter. -Use Metamucil twice a day. -Linzess samples given   #Iron deficiency anemia #History of PUD  #Hemorrhoids  Previously patient had a hemoglobin of 8 but since starting iron supplementation has come up to 12.  Had ron saturation 5 TIBC 454.  Etiology of iron deficiency is attributed to hemorrhoidal bleed which has improved recently  Advised patient to take iron supplementation every other  day and can discontinue soon  As per ACG guidelines , bidirectional endoscopy is recommended over iron replacement therapy only. We will proceed along with Upper endoscopy with small bowel biopsies and Colonoscopy   Will give Anusol suppository for 5 days  #Colonic polyps  Last colonoscopy 2019 with advanced adenoma presents for repeat 3 years and patient is due for colonoscopy now  All questions were answered.      Travis Lawman, MD Gastroenterology and Hepatology [facility] Gastroenterology   This chart has been completed using [facility] Dictation software, and while attempts have been made to ensure accuracy , certain words and phrases may not be transcribed as intended

## 2023-11-12 NOTE — Addendum Note (Signed)
 Addended by: Marlowe Shores on: 11/12/2023 04:20 PM   Modules accepted: Orders

## 2023-11-13 NOTE — Telephone Encounter (Signed)
 Patient called again, pain in severe total body pain, [facility].

## 2023-11-14 MED ORDER — COLCHICINE 0.6 MG PO TABS: 0.6000 mg | ORAL_TABLET | Freq: Every day | ORAL | 2 refills | Status: DC

## 2023-11-14 MED ORDER — PREDNISONE 5 MG PO TABS
ORAL_TABLET | ORAL | 0 refills | Status: AC
Start: 2023-11-14 — End: 2023-11-26

## 2023-11-14 NOTE — Telephone Encounter (Signed)
 I spoke with Travis [name] about starting colchicine daily and repeating prednisone taper x12 days. Please contact him to schedule a follow up appointment in a month or 2 to reassess how he is doing on the colchicine.

## 2023-11-15 LAB — ANA,IFA RA DIAG PNL W/RFLX TIT/PATN
ANA Titer 1: NEGATIVE
Cyclic Citrullin Peptide Ab: 7 U (ref 0–19)
Rheumatoid fact SerPl-aCnc: <10 [IU]/mL (ref <14.0)

## 2023-11-15 LAB — RPR: RPR Ser Ql: NONREACTIVE

## 2023-11-15 LAB — LYME DISEASE SEROLOGY W/REFLEX: Lyme Total Antibody EIA: NEGATIVE

## 2023-11-15 LAB — C-REACTIVE PROTEIN: CRP: 37 mg/L — ABNORMAL HIGH (ref 0–10)

## 2023-11-19 ENCOUNTER — Encounter: Payer: Self-pay | Admitting: Internal Medicine

## 2023-11-19 ENCOUNTER — Ambulatory Visit: Admitting: Internal Medicine

## 2023-11-19 ENCOUNTER — Other Ambulatory Visit: Payer: Self-pay

## 2023-11-19 VITALS — BP 123/75 | HR 142 | Temp 98.1°F | Ht 64.0 in | Wt 161.0 lb

## 2023-11-19 DIAGNOSIS — M1990 Unspecified osteoarthritis, unspecified site: Secondary | ICD-10-CM | POA: Insufficient documentation

## 2023-11-19 NOTE — Progress Notes (Signed)
 [facility]      Reason for Consult: Arthritis    Referring Physician: Dr. Sherwood Gambler    Patient ID: Travis [name], male    DOB: 1960/10/09, 63 y.o.   MRN: [id number]  HPI:   Travis [name] is here for evaluation of polyarthritis. He was in his usual state of health until about 2 months ago noted some back pain and then left knee swelling and general sensitivity particularly noted after starting Mounjaro.  He was hospitalized for SIRS though no infection noted and MRI was notable for significant left knee damage and aspiration noted crystals.  Also some lumbar disc disease.  He was noted to have a significantly elevated sed rate and CRP.  No infection noted and he was started on anti-inflammatory medication.  He has followed up with rheumatology as well who has started him on prednisone and allopurinol.  No signs of infection, including no fever.  He feels better with the prednisone but when he gets off it, everything flares up again.   Past Medical History:  Diagnosis Date   Arthritis    Dyslipidemia    Dyspnea    On exertion   Fatigue    NAUSEA,BLOATING   H. pylori infection 09/15/2006   Hematochezia 08/30/2007   History of nuclear stress test 04/03/2009   exercise myoview; normal pattern of perfusion; low risk scan    Hypertension    treated   Kidney stones    NIDDM (non-insulin dependent diabetes mellitus) 2011   type 2   Schizoaffective disorder (HCC)    Tobacco chew use    Unspecified vitamin D deficiency     Prior to Admission medications   Medication Sig Start Date End Date Taking? Authorizing Provider  amLODipine (NORVASC) 10 MG tablet Take 1 tablet (10 mg total) by mouth daily. For high blood pressure 10/15/18  Yes Nwoko, Agnes I, NP  aspirin-acetaminophen-caffeine (EXCEDRIN EXTRA STRENGTH) [phone] MG tablet Take 1 tablet by mouth in the morning and at bedtime.   Yes [provider]  atorvastatin (LIPITOR) 20 MG tablet Take 1 tablet  (20 mg total) by mouth daily. For high cholesterol Patient taking differently: Take 20 mg by mouth every evening. For high cholesterol 10/15/18  Yes Nwoko, Nicole Kindred I, NP  carbamazepine (TEGRETOL) 200 MG tablet Take 200 mg by mouth 2 (two) times daily. 09/28/20  Yes [provider]  cetirizine (ZYRTEC) 10 MG tablet Take 1 tablet by mouth daily.   Yes [provider]  colchicine 0.6 MG tablet Take 1 tablet (0.6 mg total) by mouth daily. 11/14/23  Yes Rice, Jamesetta Orleans, MD  Continuous Glucose Receiver (FREESTYLE LIBRE 2 READER) DEVI by Does not apply route.   Yes [provider]  Continuous Glucose Sensor (FREESTYLE LIBRE 3 SENSOR) MISC Use 1 device  every 14 (fourteen) days as directed. 05/27/23  Yes Shamleffer, Konrad Dolores, MD  cyclobenzaprine (FLEXERIL) 5 MG tablet Take by mouth. 11/03/23 12/03/23 Yes [provider]  empagliflozin (JARDIANCE) 25 MG TABS tablet Half a tablet daily 10/13/23  Yes Shamleffer, Konrad Dolores, MD  FLUoxetine (PROZAC) 20 MG capsule Take 60 mg by mouth every morning. 10/05/20  Yes [provider]  FLUoxetine (PROZAC) 40 MG capsule TAKE ONE CAPSULE BY MOUTH DAILY along with THE 20 MG FOR a total of 60 MG   Yes [provider]  furosemide (LASIX) 20 MG tablet Take 20 mg by mouth as needed. 10/12/20  Yes [provider]  gemfibrozil (LOPID)  600 MG tablet Take 600 mg by mouth 2 (two) times daily.   Yes [provider]  glipiZIDE (GLUCOTROL) 5 MG tablet TAKE 1 TABLET BY MOUTH TWICE DAILY BEFORE A MEAL 09/01/23  Yes Shamleffer, Konrad Dolores, MD  hydrochlorothiazide (HYDRODIURIL) 25 MG tablet Take 25 mg by mouth every evening. 10/16/21  Yes [provider]  HYDROcodone-acetaminophen (NORCO) 10-325 MG tablet Take 1 tablet by mouth every 8 (eight) hours as needed.   Yes [provider]  hydrocortisone (ANUSOL-HC) 25 MG suppository Place 1 suppository (25 mg total) rectally 2 (two) times daily.  11/12/23  Yes Ahmed, Juanetta Beets, MD  hydrOXYzine (ATARAX) 25 MG tablet TAKE 1 TABLET BY MOUTH DAILY AS NEEDED FOR ANXIETY OR SLEEP   Yes [provider]  insulin glargine, 2 Unit Dial, (TOUJEO MAX SOLOSTAR) 300 UNIT/ML Solostar Pen Inject 45 Units into the skin daily in the afternoon. 08/29/22  Yes Shamleffer, Konrad Dolores, MD  Insulin Pen Needle (BD PEN NEEDLE NANO 2ND GEN) 32G X 4 MM MISC USE DAILY TO inject lantus 12/26/22  Yes Shamleffer, Konrad Dolores, MD  KLOR-CON M20 20 MEQ tablet Take 20 mEq by mouth as needed. 09/28/20  Yes [provider]  meclizine (ANTIVERT) 25 MG tablet TAKE 1 TABLET BY MOUTH FOUR TIMES DAILY AS NEEDED FOR dizziness   Yes [provider]  meloxicam (MOBIC) 7.5 MG tablet Take 1 tablet by mouth daily.   Yes [provider]  metFORMIN (GLUCOPHAGE) 1000 MG tablet Take 1 tablet (1,000 mg total) by mouth 2 (two) times daily with a meal. 11/28/21  Yes Shamleffer, Konrad Dolores, MD  nitroGLYCERIN (NITROSTAT) 0.4 MG SL tablet Place 1 tablet (0.4 mg total) under the tongue every 5 (five) minutes as needed for chest pain. 10/15/18  Yes Armandina Stammer I, NP  ondansetron (ZOFRAN) 4 MG tablet Take 4-8 mg by mouth every 6 (six) hours.   Yes [provider]  pantoprazole (PROTONIX) 40 MG tablet Take 1 tablet (40 mg total) by mouth daily. For acid reflux Patient taking differently: Take 40 mg by mouth at bedtime. For acid reflux 10/15/18  Yes Nwoko, Agnes I, NP  polyethylene glycol (MIRALAX / GLYCOLAX) 17 g packet Take 17 g by mouth 2 (two) times daily. 11/12/23 02/10/24 Yes Ahmed, Juanetta Beets, MD  pramipexole (MIRAPEX) 1.5 MG tablet Take 1.5 mg by mouth at bedtime.   Yes [provider]  predniSONE (DELTASONE) 5 MG tablet Take 4 tablets (20 mg total) by mouth daily with breakfast for 3 days, THEN 3 tablets (15 mg total) daily with breakfast for 3 days, THEN 2 tablets (10 mg total) daily with breakfast for 3 days, THEN 1 tablet (5 mg total)  daily with breakfast for 3 days. 11/14/23 11/26/23 Yes Rice, Jamesetta Orleans, MD  psyllium (METAMUCIL) 58.6 % packet Take 1 packet by mouth 2 (two) times daily. 11/12/23 02/10/24 Yes Ahmed, Juanetta Beets, MD  tamsulosin (FLOMAX) 0.4 MG CAPS capsule Take 1 capsule (0.4 mg total) by mouth daily after breakfast. For prostate health 10/15/18  Yes Armandina Stammer I, NP  traMADol (ULTRAM) 50 MG tablet Take 1 tablet by mouth every 6 (six) hours.   Yes [provider]  traZODone (DESYREL) 100 MG tablet Take 1 tablet (100 mg total) by mouth at bedtime. For sleep 10/15/18  Yes Armandina Stammer I, NP  Vitamin D, Ergocalciferol, (DRISDOL) 1.25 MG (50000 UNIT) CAPS capsule Take 50,000 Units by mouth once a week. 07/14/23  Yes [provider]  Maricela Bo  325 (65 Fe) MG tablet Take 325 mg by mouth 2 (two) times daily with a meal. Patient not taking: Reported on 11/19/2023 07/22/23   [provider]    Allergies  Allergen Reactions   Doxycycline Other (See Comments)    Reaction:  Blisters    Gabapentin     Other reaction(s): Hallucinations, Lightheadedness   Methocarbamol     Other reaction(s): Confusion, Hallucinations   Lisinopril Swelling   Lyrica [Pregabalin]    Promethazine     Hallucinations    Social History   Tobacco Use   Smoking status: Never    Passive exposure: Past   Smokeless tobacco: Former    Types: Snuff, Chew    Quit date: 10/26/2020   Tobacco comments:    started chewing tobacco at age 74 years old-stopped chewing and dips snuff only stopped dipping  Vaping Use   Vaping status: Never Used  Substance Use Topics   Alcohol use: No    Alcohol/week: 0.0 standard drinks of alcohol   Drug use: No    Family History  Problem Relation Age of Onset   Stroke Father 28   Pneumonia Father    Heart failure Mother 61   Coronary artery disease Mother        also MI   Heart attack Mother    Heart attack Maternal Uncle        died in 73s   Cancer Other        x2; maternal side    Diabetes Maternal Grandmother      Review of Systems  Constitutional: negative for fevers All other systems reviewed and are negative    Constitutional: no distress  Vitals:   11/19/23 1503  BP: 123/75  Pulse: (!) 142  Temp: 98.1 F (36.7 C)   EYES: anicteric Respiratory: Normal respiratory effort Musculoskeletal: Left knee with a brace in place and left knee effusion.   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  May be a hypersensitivity from the [facility] as well.  Unclear etiology but no signs of infection.  Did have aspiration of the knee with no significant white blood cells noted.  At this time no indication for antibiotics.  He will continue with prednisone per rheumatology.  He can follow-up here as needed.

## 2023-11-21 ENCOUNTER — Ambulatory Visit: Payer: BC Managed Care – PPO | Admitting: Physician Assistant

## 2023-11-25 HISTORY — PX: ROOT CANAL: SHX2363

## 2023-11-27 ENCOUNTER — Other Ambulatory Visit: Payer: Self-pay | Admitting: Internal Medicine

## 2023-11-27 DIAGNOSIS — M1120 Other chondrocalcinosis, unspecified site: Secondary | ICD-10-CM

## 2023-12-05 ENCOUNTER — Ambulatory Visit: Payer: BC Managed Care – PPO | Admitting: Internal Medicine

## 2023-12-05 NOTE — Progress Notes (Deleted)
 Name: Travis [name]  MRN/ DOB: [id number], 07-27-1961   Age/ Sex: 63 y.o., male    PCP: Elfredia Nevins, MD   Reason for Endocrinology Evaluation: Type 2 Diabetes Mellitus     Date of Initial Endocrinology Visit: 11/28/2021    PATIENT IDENTIFIER: Travis [name] is a 63 y.o. male with a past medical history of T2DM, HTN, and dyslipidemia. The patient presented for initial endocrinology clinic visit on 11/28/2021 for consultative assistance with his diabetes management.    HPI: Travis [name] is accompanied bu his wife who is also a nurse   Diagnosed with DM in many years ago          Hemoglobin A1c has ranged from 6.3% in 2018, peaking at 10.6% in 2012.   On his initial visit to our clinic his A1c was 10.4%, he was on glipizide, a carbose, and metformin.  We continued metformin, started Lantus, Trulicity, and stopped acarbose   We had to restart glipizide June 2023 due to hyperglycemia  Trulicity 0.75 mg caused nausea and vomiting May 2023  Jardiance started 02/2022  Switched Ozempic to [facility] 07/2023  SUBJECTIVE:   During the last visit (08/01/2023): A1c 7.7%     Today (12/05/23): Travis [name] is here for follow-up on diabetes management.  He is accompanied by his spouse today.  He checks his blood sugars multiple times daily through [employer]. The patient has not had hypoglycemic episodes since the last clinic visit.   Has occasional nausea but no vomiting  No Constipation or  diarrhea   Has noted a rash on lower extremities for over a year , that is pruritic , has a referral to dermatology   Due to complaints of fatigue, his PCP check serum cortisol which was elevated as below, last neck glucocortoid 11/27th   Has tingling and pain of feet     HOME DIABETES REGIMEN: Metformin 1000 mg BID  Glipizide 5 mg BID Mounjaro 5 mg weekly  Jardiance 25 mg, half a tab daily  Toujeo 44 units      Statin: Yes ACE-I/ARB: Yes    CONTINUOUS GLUCOSE MONITORING  RECORD INTERPRETATION    N/a   DIABETIC COMPLICATIONS: Microvascular complications:   Denies: CKD, retinopathy,  Last eye exam: Completed 07/2021  Macrovascular complications:   Denies: CAD, PVD, CVA   PAST HISTORY: Past Medical History:  Past Medical History:  Diagnosis Date   Arthritis    Dyslipidemia    Dyspnea    On exertion   Fatigue    NAUSEA,BLOATING   H. pylori infection 09/15/2006   Hematochezia 08/30/2007   History of nuclear stress test 04/03/2009   exercise myoview; normal pattern of perfusion; low risk scan    Hypertension    treated   Kidney stones    NIDDM (non-insulin dependent diabetes mellitus) 2011   type 2   Schizoaffective disorder (HCC)    Tobacco chew use    Unspecified vitamin D deficiency    Past Surgical History:  Past Surgical History:  Procedure Laterality Date   APPENDECTOMY     HERNIA REPAIR     RIGHT   KNEE SURGERY     BOTH   TOTAL KNEE ARTHROPLASTY Right 10/30/2020   Procedure: TOTAL KNEE ARTHROPLASTY;  Surgeon: Ollen Gross, MD;  Location: [facility];  Service: Orthopedics;  Laterality: Right;    TRANSTHORACIC ECHOCARDIOGRAM  04/03/2009   EF=>55%; trace TR, mild pulm regurg    Social History:  reports that he has never smoked.  He has been exposed to tobacco smoke. He quit smokeless tobacco use about 3 years ago.  His smokeless tobacco use included snuff and chew. He reports that he does not drink alcohol and does not use drugs. Family History:  Family History  Problem Relation Age of Onset   Stroke Father 13   Pneumonia Father    Heart failure Mother 39   Coronary artery disease Mother        also MI   Heart attack Mother    Heart attack Maternal Uncle        died in 59s   Cancer Other        x2; maternal side   Diabetes Maternal Grandmother      HOME MEDICATIONS: Allergies as of 12/05/2023       Reactions   Doxycycline Other (See Comments)   Reaction:  Blisters    Gabapentin    Other reaction(s):  Hallucinations, Lightheadedness   Methocarbamol    Other reaction(s): Confusion, Hallucinations   Lisinopril Swelling   Lyrica [pregabalin]    Promethazine    Hallucinations        Medication List        Accurate as of December 05, 2023  8:18 AM. If you have any questions, ask your nurse or doctor.          amLODipine 10 MG tablet Commonly known as: NORVASC Take 1 tablet (10 mg total) by mouth daily. For high blood pressure   atorvastatin 20 MG tablet Commonly known as: LIPITOR Take 1 tablet (20 mg total) by mouth daily. For high cholesterol What changed: when to take this   BD Pen Needle Nano 2nd Gen 32G X 4 MM Misc Generic drug: Insulin Pen Needle USE DAILY TO inject lantus   carbamazepine 200 MG tablet Commonly known as: TEGRETOL Take 200 mg by mouth 2 (two) times daily.   cetirizine 10 MG tablet Commonly known as: ZYRTEC Take 1 tablet by mouth daily.   colchicine 0.6 MG tablet Take 1 tablet (0.6 mg total) by mouth daily.   empagliflozin 25 MG Tabs tablet Commonly known as: Jardiance Half a tablet daily   Excedrin Extra Strength 250-250-65 MG tablet Generic drug: aspirin-acetaminophen-caffeine Take 1 tablet by mouth in the morning and at bedtime.   FeroSul 325 (65 Fe) MG tablet Generic drug: ferrous sulfate Take 325 mg by mouth 2 (two) times daily with a meal.   FLUoxetine 40 MG capsule Commonly known as: PROZAC TAKE ONE CAPSULE BY MOUTH DAILY along with THE 20 MG FOR a total of 60 MG   FLUoxetine 20 MG capsule Commonly known as: PROZAC Take 60 mg by mouth every morning.   FreeStyle Libre 2 Reader [place] by Does not apply route.   FreeStyle Libre 3 Sensor Misc Use 1 device  every 14 (fourteen) days as directed.   furosemide 20 MG tablet Commonly known as: LASIX Take 20 mg by mouth as needed.   gemfibrozil 600 MG tablet Commonly known as: LOPID Take 600 mg by mouth 2 (two) times daily.   glipiZIDE 5 MG tablet Commonly known as:  GLUCOTROL TAKE 1 TABLET BY MOUTH TWICE DAILY BEFORE A MEAL   hydrochlorothiazide 25 MG tablet Commonly known as: HYDRODIURIL Take 25 mg by mouth every evening.   HYDROcodone-acetaminophen 10-325 MG tablet Commonly known as: NORCO Take 1 tablet by mouth every 8 (eight) hours as needed.   hydrocortisone 25 MG suppository Commonly known as: ANUSOL-HC Place 1 suppository (25 mg total) rectally  2 (two) times daily.   hydrOXYzine 25 MG tablet Commonly known as: ATARAX TAKE 1 TABLET BY MOUTH DAILY AS NEEDED FOR ANXIETY OR SLEEP   Klor-Con M20 20 MEQ tablet Generic drug: potassium chloride SA Take 20 mEq by mouth as needed.   meclizine 25 MG tablet Commonly known as: ANTIVERT TAKE 1 TABLET BY MOUTH FOUR TIMES DAILY AS NEEDED FOR dizziness   meloxicam 7.5 MG tablet Commonly known as: MOBIC Take 1 tablet by mouth daily.   metFORMIN 1000 MG tablet Commonly known as: GLUCOPHAGE Take 1 tablet (1,000 mg total) by mouth 2 (two) times daily with a meal.   nitroGLYCERIN 0.4 MG SL tablet Commonly known as: NITROSTAT Place 1 tablet (0.4 mg total) under the tongue every 5 (five) minutes as needed for chest pain.   ondansetron 4 MG tablet Commonly known as: ZOFRAN Take 4-8 mg by mouth every 6 (six) hours.   pantoprazole 40 MG tablet Commonly known as: PROTONIX Take 1 tablet (40 mg total) by mouth daily. For acid reflux What changed: when to take this   polyethylene glycol 17 g packet Commonly known as: MIRALAX / GLYCOLAX Take 17 g by mouth 2 (two) times daily.   pramipexole 1.5 MG tablet Commonly known as: MIRAPEX Take 1.5 mg by mouth at bedtime.   psyllium 58.6 % packet Commonly known as: METAMUCIL Take 1 packet by mouth 2 (two) times daily.   tamsulosin 0.4 MG Caps capsule Commonly known as: FLOMAX Take 1 capsule (0.4 mg total) by mouth daily after breakfast. For prostate health   Toujeo Max SoloStar 300 UNIT/ML Solostar Pen Generic drug: insulin glargine (2 Unit  Dial) Inject 45 Units into the skin daily in the afternoon.   traMADol 50 MG tablet Commonly known as: ULTRAM Take 1 tablet by mouth every 6 (six) hours.   traZODone 100 MG tablet Commonly known as: DESYREL Take 1 tablet (100 mg total) by mouth at bedtime. For sleep   Vitamin D (Ergocalciferol) 1.25 MG (50000 UNIT) Caps capsule Commonly known as: DRISDOL Take 50,000 Units by mouth once a week.         ALLERGIES: Allergies  Allergen Reactions   Doxycycline Other (See Comments)    Reaction:  Blisters    Gabapentin     Other reaction(s): Hallucinations, Lightheadedness   Methocarbamol     Other reaction(s): Confusion, Hallucinations   Lisinopril Swelling   Lyrica [Pregabalin]    Promethazine     Hallucinations        OBJECTIVE:   VITAL SIGNS: There were no vitals taken for this visit.   PHYSICAL EXAM:  General: Pt appears well and is in NAD  Lungs: Clear with good BS bilat   Heart: RRR   Extremities:  Lower extremities - No pretibial edema patient with erythematous macules over the shins bilaterally  Neuro: MS is good with appropriate affect, pt is alert and Ox3    DM foot exam: 08/01/2023  The skin of the feet is without sores or ulcerations, skin flakes noted The pedal pulses are 2+ on right and 2+ on left. The sensation is decreased to a screening 5.07, 10 gram monofilament bilaterally   DATA REVIEWED:  Lab Results  Component Value Date   HGBA1C 8.4 (A) 01/29/2023   HGBA1C 8.2 (A) 07/25/2022   HGBA1C 11.0 (A) 03/15/2022   07/17/2023  BUN 27 CR 1.07 GFR 77 Na 140 K 4.9 TG 307 HDL 24 LDL 67 Alb/CR ratio 10 A1c 7.7 Cortisol 19.5 ( reference  6.2-19.4)     Latest Reference Range & Units 01/29/23 14:16  Sodium 135 - 145 mEq/L 137  Potassium 3.5 - 5.1 mEq/L 4.2  Chloride 96 - 112 mEq/L 98  CO2 19 - 32 mEq/L 22  Glucose 70 - 99 mg/dL 94  BUN 6 - 23 mg/dL 36 (H)  Creatinine 8.11 - 1.50 mg/dL 9.14 (H)  Calcium 8.4 - 10.5 mg/dL 9.9  GFR  >78.29 mL/min 47.68 (L)    ASSESSMENT / PLAN / RECOMMENDATIONS:   1) Type 2 Diabetes Mellitus, poorly controlled, With neuropathic complications - Most recent A1c of 7.7%. Goal A1c <7.0%.    -A1c remains above goal -He is intolerant to small dose of Trulicity as well as Ozempic due to GI side effects -I have recommended switching to [facility] which she is in agreement of -I will increase Toujeo as below -We had decreased Jardiance to half a tablet due to dehydration and AKI   MEDICATIONS: -Stop Ozempic -Start Mounjaro 5 mg weekly -Increase Toujeo 44 units daily -Continue Jardiance 25 mg, half a tablet daily -Continue glipizide 5 mg twice daily -Continue metformin 1000 mg twice daily  EDUCATION / INSTRUCTIONS: BG monitoring instructions: Patient is instructed to check his blood sugars 3 times a day. Call [facility] if: BG persistently < 70  I reviewed the Rule of 15 for the treatment of hypoglycemia in detail with the patient. Literature supplied.   2) Diabetic complications:  Eye: Does not have known diabetic retinopathy.  Neuro/ Feet: Does  have known diabetic peripheral neuropathy. Renal: Patient does not have known baseline CKD. He is  on an ACEI/ARB at present.   3)Dyslipidemia:  -LDL has been at goal -Patient on atorvastatin per PCP   4) Elevated serum cortisol:  -He was evaluated through his PCPs office for fatigue and was noted to have an elevated serum cortisol at 19.5 (6.2-19.4) -Will proceed with 24-hour urinary cortisol  5) Peripheral neuropathy:  -Patient to use topical OTC remedies for breakthrough symptoms -He is intolerant to gabapentin and Lyrica -Patient on Tegretol through PCPs office  Follow-up in 4 months     Signed electronically by: Lyndle Herrlich, MD  [facility]  [facility] [address] [place] [place], [place] [address] Phone: [phone] FAX: [phone]   CC: Elfredia Nevins, MD [address] [place] [address] Phone: [phone]  Fax: [phone]    Return to Endocrinology clinic as below: Future Appointments  Date Time Provider Department Center  12/05/2023  2:00 PM Lyndall Bellot, Konrad Dolores, MD [facility] None  12/16/2023 10:30 AM [facility] PAT 3 [facility] None  01/02/2024 11:20 AM Rice, Jamesetta Orleans, MD [facility] None  02/12/2024  2:45 PM Raquel James, NP [facility] None  07/16/2024  8:30 AM Rosann Auerbach, PhD [facility] None  07/16/2024  9:30 AM [facility]UROPSY[facility] TECH [facility] None  07/27/2024 10:00 AM Rosann Auerbach, PhD [facility] None

## 2023-12-16 ENCOUNTER — Other Ambulatory Visit: Payer: Self-pay

## 2023-12-16 ENCOUNTER — Other Ambulatory Visit (HOSPITAL_COMMUNITY)
Admission: RE | Admit: 2023-12-16 | Discharge: 2023-12-16 | Disposition: A | Discharge status: Home / Self Care | Source: Ambulatory Visit | Attending: Gastroenterology | Admitting: Gastroenterology

## 2023-12-16 ENCOUNTER — Encounter (HOSPITAL_COMMUNITY): Payer: Self-pay

## 2023-12-16 ENCOUNTER — Encounter (HOSPITAL_COMMUNITY)
Admission: RE | Admit: 2023-12-16 | Discharge: 2023-12-16 | Disposition: A | Discharge status: Home / Self Care | Source: Ambulatory Visit | Attending: Gastroenterology | Admitting: Gastroenterology

## 2023-12-16 DIAGNOSIS — K2289 Other specified disease of esophagus: Secondary | ICD-10-CM | POA: Diagnosis not present

## 2023-12-16 DIAGNOSIS — Z01812 Encounter for preprocedural laboratory examination: Secondary | ICD-10-CM | POA: Insufficient documentation

## 2023-12-16 DIAGNOSIS — K2970 Gastritis, unspecified, without bleeding: Secondary | ICD-10-CM | POA: Diagnosis not present

## 2023-12-16 DIAGNOSIS — D500 Iron deficiency anemia secondary to blood loss (chronic): Secondary | ICD-10-CM | POA: Insufficient documentation

## 2023-12-16 DIAGNOSIS — D509 Iron deficiency anemia, unspecified: Secondary | ICD-10-CM | POA: Diagnosis not present

## 2023-12-16 DIAGNOSIS — D123 Benign neoplasm of transverse colon: Secondary | ICD-10-CM | POA: Diagnosis not present

## 2023-12-16 DIAGNOSIS — Z1211 Encounter for screening for malignant neoplasm of colon: Secondary | ICD-10-CM | POA: Diagnosis present

## 2023-12-16 DIAGNOSIS — K5730 Diverticulosis of large intestine without perforation or abscess without bleeding: Secondary | ICD-10-CM | POA: Diagnosis not present

## 2023-12-16 DIAGNOSIS — K648 Other hemorrhoids: Secondary | ICD-10-CM | POA: Diagnosis not present

## 2023-12-16 DIAGNOSIS — K2100 Gastro-esophageal reflux disease with esophagitis, without bleeding: Secondary | ICD-10-CM | POA: Diagnosis not present

## 2023-12-16 DIAGNOSIS — K319 Disease of stomach and duodenum, unspecified: Secondary | ICD-10-CM | POA: Diagnosis not present

## 2023-12-16 DIAGNOSIS — D122 Benign neoplasm of ascending colon: Secondary | ICD-10-CM | POA: Diagnosis not present

## 2023-12-16 HISTORY — DX: Personal history of urinary calculi: Z87.442

## 2023-12-16 LAB — BASIC METABOLIC PANEL WITH GFR
Anion gap: 15 (ref 5–15)
BUN: 22 mg/dL (ref 8–23)
CO2: 18 mmol/L — ABNORMAL LOW (ref 22–32)
Calcium: 9.5 mg/dL (ref 8.9–10.3)
Chloride: 102 mmol/L (ref 98–111)
Creatinine, Ser: 0.95 mg/dL (ref 0.61–1.24)
GFR, Estimated: >60 mL/min (ref >60)
Glucose, Bld: 218 mg/dL — ABNORMAL HIGH (ref 70–99)
Potassium: 4.8 mmol/L (ref 3.5–5.1)
Sodium: 135 mmol/L (ref 135–145)

## 2023-12-16 NOTE — Patient Instructions (Signed)
 Travis [name]  12/16/2023     @PREFPERIOPPHARMACY @   Your procedure is scheduled on 12/18/2023.   Report to [facility] at 6:00 A.M.   Call this number if you have problems the morning of surgery:  [phone]  If you experience any cold or flu symptoms such as cough, fever, chills, shortness of breath, etc. between now and your scheduled surgery, please notify us  at the above number.   Remember:   Please Follow the Diet and prep instructions given to you by Dr John Muzzy office.    You may drink clear liquids until 3:30 AM the morning of the procedure.     Clear liquids allowed are:                    Water, Juice (No red color; non-citric and without pulp; diabetics please choose diet or no sugar options), Carbonated beverages (diabetics please choose diet or no sugar options), Clear Tea (No creamer, milk, or cream, including half & half and powdered creamer), Black Coffee Only (No creamer, milk or cream, including half & half and powdered creamer), and Clear Sports drink (No red color; diabetics please choose diet or no sugar options)    Take these medicines the morning of surgery with A SIP OF WATER         Do not wear jewelry, make-up or nail polish, including gel polish,  artificial nails, or any other type of covering on natural nails (fingers and  toes).  Do not wear lotions, powders, or perfumes, or deodorant.  Do not shave 48 hours prior to surgery.  Men may shave face and neck.  Do not bring valuables to the hospital.  [facility] is not responsible for any belongings or valuables.  Contacts, dentures or bridgework may not be worn into surgery.  Leave your suitcase in the car.  After surgery it may be brought to your room.  For patients admitted to the hospital, discharge time will be determined by your treatment team.  Patients discharged the day of surgery will not be allowed to drive home.   Name and phone number of your driver:   Family  Special  instructions:  N/A  Please read over the following fact sheets that you were given.  Care and Recovery After Surgery   Upper Endoscopy, Adult Upper endoscopy is a procedure to look inside the upper GI (gastrointestinal) tract. The upper GI tract is made up of: The esophagus. This is the part of the body that moves food from your mouth to your stomach. The stomach. The duodenum. This is the first part of your small intestine. This procedure is also called esophagogastroduodenoscopy (EGD) or gastroscopy. In this procedure, your health care provider passes a thin, flexible tube (endoscope) through your mouth and down your esophagus into your stomach and into your duodenum. A small camera is attached to the end of the tube. Images from the camera appear on a monitor in the exam room. During this procedure, your health care provider may also remove a small piece of tissue to be sent to a lab and examined under a microscope (biopsy). Your health care provider may do an upper endoscopy to diagnose cancers of the upper GI tract. You may also have this procedure to find the cause of other conditions, such as: Stomach pain. Heartburn. Pain or problems when swallowing. Nausea and vomiting. Stomach bleeding. Stomach ulcers. Tell a health care provider about: Any allergies you have. All medicines you  are taking, including vitamins, herbs, eye drops, creams, and over-the-counter medicines. Any problems you or family members have had with anesthetic medicines. Any bleeding problems you have. Any surgeries you have had. Any medical conditions you have. Whether you are pregnant or may be pregnant. What are the risks? Your healthcare provider will talk with you about risks. These may include: Infection. Bleeding. Allergic reactions to medicines. A tear or hole (perforation) in the esophagus, stomach, or duodenum. What happens before the procedure? When to stop eating and drinking Follow  instructions from your health care provider about what you may eat and drink. These may include: 8 hours before your procedure Stop eating most foods. Do not eat meat, fried foods, or fatty foods. Eat only light foods, such as toast or crackers. All liquids are okay except energy drinks and alcohol. 6 hours before your procedure Stop eating. Drink only clear liquids, such as water, clear fruit juice, black coffee, plain tea, and sports drinks. Do not drink energy drinks or alcohol. 2 hours before your procedure Stop drinking all liquids. You may be allowed to take medicines with small sips of water. If you do not follow your health care provider's instructions, your procedure may be delayed or canceled. Medicines Ask your health care provider about: Changing or stopping your regular medicines. This is especially important if you are taking diabetes medicines or blood thinners. Taking medicines such as aspirin  and ibuprofen. These medicines can thin your blood. Do not take these medicines unless your health care provider tells you to take them. Taking over-the-counter medicines, vitamins, herbs, and supplements. General instructions If you will be going home right after the procedure, plan to have a responsible adult: Take you home from the hospital or clinic. You will not be allowed to drive. Care for you for the time you are told. What happens during the procedure?  An IV will be inserted into one of your veins. You may be given one or more of the following: A medicine to help you relax (sedative). A medicine to numb the throat (local anesthetic). You will lie on your left side on an exam table. Your health care provider will pass the endoscope through your mouth and down your esophagus. Your health care provider will use the scope to check the inside of your esophagus, stomach, and duodenum. Biopsies may be taken. The endoscope will be removed. The procedure may vary among health  care providers and hospitals. What happens after the procedure? Your blood pressure, heart rate, breathing rate, and blood oxygen level will be monitored until you leave the hospital or clinic. When your throat is no longer numb, you may be given some fluids to drink. If you were given a sedative during the procedure, it can affect you for several hours. Do not drive or operate machinery until your health care provider says that it is safe. It is up to you to get the results of your procedure. Ask your health care provider, or the department that is doing the procedure, when your results will be ready. Contact a health care provider if you: Have a sore throat that lasts longer than 1 day. Have a fever. Get help right away if you: Vomit blood or your vomit looks like coffee grounds. Have bloody, black, or tarry stools. Have a very bad sore throat or you cannot swallow. Have difficulty breathing or very bad pain in your chest or abdomen. These symptoms may be an emergency. Get help right away. Call 911. Do  not wait to see if the symptoms will go away. Do not drive yourself to the hospital. Summary Upper endoscopy is a procedure to look inside the upper GI tract. During the procedure, an IV will be inserted into one of your veins. You may be given a medicine to help you relax. The endoscope will be passed through your mouth and down your esophagus. Follow instructions from your health care provider about what you can eat and drink. This information is not intended to replace advice given to you by your health care provider. Make sure you discuss any questions you have with your health care provider. Document Revised: 11/21/2021 Document Reviewed: 11/21/2021 Elsevier Patient Education  2024 [employer]  Colonoscopy, Adult A colonoscopy is a procedure to look at the entire large intestine. This procedure is done using a long, thin, flexible tube that has a camera on the end. You may have a  colonoscopy: As a part of normal colorectal screening. If you have certain symptoms, such as: A low number of red blood cells in your blood (anemia). Diarrhea that does not go away. Pain in your abdomen. Blood in your stool. A colonoscopy can help screen for and diagnose medical problems, including: An abnormal growth of cells or tissue (tumor). Abnormal growths within the lining of your intestine (polyps). Inflammation. Areas of bleeding. Tell your health care provider about: Any allergies you have. All medicines you are taking, including vitamins, herbs, eye drops, creams, and over-the-counter medicines. Any problems you or family members have had with anesthetic medicines. Any bleeding problems you have. Any surgeries you have had. Any medical conditions you have. Any problems you have had with having bowel movements. Whether you are pregnant or may be pregnant. What are the risks? Generally, this is a safe procedure. However, problems may occur, including: Bleeding. Damage to your intestine. Allergic reactions to medicines given during the procedure. Infection. This is rare. What happens before the procedure? Eating and drinking restrictions Follow instructions from your health care provider about eating or drinking restrictions, which may include: A few days before the procedure: Follow a low-fiber diet. Avoid nuts, seeds, dried fruit, raw fruits, and vegetables. 1-3 days before the procedure: Eat only gelatin dessert or ice pops. Drink only clear liquids, such as water, clear juice, clear broth or bouillon, black coffee or tea, or clear soft drinks or sports drinks. Avoid liquids that contain red or purple dye. The day of the procedure: Do not eat solid foods. You may continue to drink clear liquids until up to 2 hours before the procedure. Do not eat or drink anything starting 2 hours before the procedure, or within the time period that your health care provider  recommends. Bowel prep If you were prescribed a bowel prep to take by mouth (orally) to clean out your colon: Take it as told by your health care provider. Starting the day before your procedure, you will need to drink a large amount of liquid medicine. The liquid will cause you to have many bowel movements of loose stool until your stool becomes almost clear or light green. If your skin or the opening between the buttocks (anus) gets irritated from diarrhea, you may relieve the irritation using: Wipes with medicine in them, such as adult wet wipes with aloe and vitamin E. A product to soothe skin, such as petroleum jelly. If you vomit while drinking the bowel prep: Take a break for up to 60 minutes. Begin the bowel prep again. Call your health  care provider if you keep vomiting or you cannot take the bowel prep without vomiting. To clean out your colon, you may also be given: Laxative medicines. These help you have a bowel movement. Instructions for enema use. An enema is liquid medicine injected into your rectum. Medicines Ask your health care provider about: Changing or stopping your regular medicines or supplements. This is especially important if you are taking iron supplements, diabetes medicines, or blood thinners. Taking medicines such as aspirin  and ibuprofen. These medicines can thin your blood. Do not take these medicines unless your health care provider tells you to take them. Taking over-the-counter medicines, vitamins, herbs, and supplements. General instructions Ask your health care provider what steps will be taken to help prevent infection. These may include washing skin with a germ-killing soap. If you will be going home right after the procedure, plan to have a responsible adult: Take you home from the hospital or clinic. You will not be allowed to drive. Care for you for the time you are told. What happens during the procedure?  An IV will be inserted into one of your  veins. You will be given a medicine to make you fall asleep (general anesthetic). You will lie on your side with your knees bent. A lubricant will be put on the tube. Then the tube will be: Inserted into your anus. Gently eased through all parts of your large intestine. Air will be sent into your colon to keep it open. This may cause some pressure or cramping. Images will be taken with the camera and will appear on a screen. A small tissue sample may be removed to be looked at under a microscope (biopsy). The tissue may be sent to a lab for testing if any signs of problems are found. If small polyps are found, they may be removed and checked for cancer cells. When the procedure is finished, the tube will be removed. The procedure may vary among health care providers and hospitals. What happens after the procedure? Your blood pressure, heart rate, breathing rate, and blood oxygen level will be monitored until you leave the hospital or clinic. You may have a small amount of blood in your stool. You may pass gas and have mild cramping or bloating in your abdomen. This is caused by the air that was used to open your colon during the exam. If you were given a sedative during the procedure, it can affect you for several hours. Do not drive or operate machinery until your health care provider says that it is safe. It is up to you to get the results of your procedure. Ask your health care provider, or the department that is doing the procedure, when your results will be ready. Summary A colonoscopy is a procedure to look at the entire large intestine. Follow instructions from your health care provider about eating and drinking before the procedure. If you were prescribed an oral bowel prep to clean out your colon, take it as told by your health care provider. During the colonoscopy, a flexible tube with a camera on its end is inserted into the anus and then passed into all parts of the large  intestine. This information is not intended to replace advice given to you by your health care provider. Make sure you discuss any questions you have with your health care provider. Document Revised: 09/24/2022 Document Reviewed: 04/04/2021 Elsevier Patient Education  2024 [employer] Inc[employer] Monitored Anesthesia Care Anesthesia refers to the techniques, procedures, and medicines that  help a person stay safe and comfortable during surgery. Monitored anesthesia care, or sedation, is one type of anesthesia. You may have sedation if you do not need to be asleep for your procedure. Procedures that use sedation may include: Surgery to remove cataracts from your eyes. A dental procedure. A biopsy. This is when a tissue sample is removed and looked at under a microscope. You will be watched closely during your procedure. Your level of sedation or type of anesthesia may be changed to fit your needs. Tell a health care provider about: Any allergies you have. All medicines you are taking, including vitamins, herbs, eye drops, creams, and over-the-counter medicines. Any problems you or family members have had with anesthesia. Any bleeding problems you have. Any surgeries you have had. Any medical conditions or illnesses you have. This includes sleep apnea, cough, fever, or the flu. Whether you are pregnant or may be pregnant. Whether you use cigarettes, alcohol, or drugs. Any use of steroids, whether by mouth or as a cream. What are the risks? Your health care provider will talk with you about risks. These may include: Getting too much medicine (oversedation). Nausea. Allergic reactions to medicines. Trouble breathing. If this happens, a breathing tube may be used to help you breathe. It will be removed when you are awake and breathing on your own. Heart trouble. Lung trouble. Confusion that gets better with time (emergence delirium). What happens before the procedure? When to stop eating and  drinking Follow instructions from your health care provider about what you may eat and drink. These may include: 8 hours before your procedure Stop eating most foods. Do not eat meat, fried foods, or fatty foods. Eat only light foods, such as toast or crackers. All liquids are okay except energy drinks and alcohol. 6 hours before your procedure Stop eating. Drink only clear liquids, such as water, clear fruit juice, black coffee, plain tea, and sports drinks. Do not drink energy drinks or alcohol. 2 hours before your procedure Stop drinking all liquids. You may be allowed to take medicines with small sips of water. If you do not follow your health care provider's instructions, your procedure may be delayed or canceled. Medicines Ask your health care provider about: Changing or stopping your regular medicines. These include any diabetes medicines or blood thinners you take. Taking medicines such as aspirin  and ibuprofen. These medicines can thin your blood. Do not take them unless your health care provider tells you to. Taking over-the-counter medicines, vitamins, herbs, and supplements. Testing You may have an exam or testing. You may have a blood or urine sample taken. General instructions Do not use any products that contain nicotine  or tobacco for at least 4 weeks before the procedure. These products include cigarettes, chewing tobacco, and vaping devices, such as e-cigarettes. If you need help quitting, ask your health care provider. If you will be going home right after the procedure, plan to have a responsible adult: Take you home from the hospital or clinic. You will not be allowed to drive. Care for you for the time you are told. What happens during the procedure?  Your blood pressure, heart rate, breathing, level of pain, and blood oxygen level will be monitored. An IV will be inserted into one of your veins. You may be given: A sedative. This helps you relax. Anesthesia.  This will: Numb certain areas of your body. Make you fall asleep for surgery. You will be given medicines as needed to keep you comfortable. The  more medicine you are given, the deeper your level of sedation will be. Your level of sedation may be changed to fit your needs. There are three levels of sedation: Mild sedation. At this level, you may feel awake and relaxed. You will be able to follow directions. Moderate sedation. At this level, you will be sleepy. You may not remember the procedure. Deep sedation. At this level, you will be asleep. You will not remember the procedure. How you get the medicines will depend on your age and the procedure. They may be given as: A pill. This may be taken by mouth (orally) or inserted into the rectum. An injection. This may be into a vein or muscle. A spray through the nose. After your procedure is over, the medicine will be stopped. The procedure may vary among health care providers and hospitals. What happens after the procedure? Your blood pressure, heart rate, breathing rate, and blood oxygen level will be monitored until you leave the hospital or clinic. You may feel sleepy, clumsy, or nauseous. You may not remember what happened during or after the procedure. Sedation can affect you for several hours. Do not drive or use machinery until your health care provider says that it is safe. This information is not intended to replace advice given to you by your health care provider. Make sure you discuss any questions you have with your health care provider. Document Revised: 01/07/2022 Document Reviewed: 01/07/2022 Elsevier Patient Education  2024 [employer].

## 2023-12-16 NOTE — Pre-Procedure Instructions (Signed)
 Attempted pre-op phone call. Left VM for him to call us back.

## 2023-12-18 ENCOUNTER — Ambulatory Visit (HOSPITAL_COMMUNITY): Admitting: Certified Registered"

## 2023-12-18 ENCOUNTER — Ambulatory Visit (HOSPITAL_COMMUNITY)
Admission: RE | Admit: 2023-12-18 | Discharge: 2023-12-18 | Disposition: A | Discharge status: Home / Self Care | Attending: Gastroenterology | Admitting: Gastroenterology

## 2023-12-18 ENCOUNTER — Encounter (HOSPITAL_COMMUNITY): Payer: Self-pay | Admitting: Gastroenterology

## 2023-12-18 ENCOUNTER — Encounter (HOSPITAL_COMMUNITY)
Admission: RE | Disposition: A | Discharge status: Home / Self Care | Payer: Self-pay | Source: Home / Self Care | Attending: Gastroenterology

## 2023-12-18 DIAGNOSIS — K2970 Gastritis, unspecified, without bleeding: Secondary | ICD-10-CM | POA: Diagnosis not present

## 2023-12-18 DIAGNOSIS — K5730 Diverticulosis of large intestine without perforation or abscess without bleeding: Secondary | ICD-10-CM | POA: Insufficient documentation

## 2023-12-18 DIAGNOSIS — K3189 Other diseases of stomach and duodenum: Secondary | ICD-10-CM

## 2023-12-18 DIAGNOSIS — D123 Benign neoplasm of transverse colon: Secondary | ICD-10-CM | POA: Insufficient documentation

## 2023-12-18 DIAGNOSIS — K648 Other hemorrhoids: Secondary | ICD-10-CM | POA: Insufficient documentation

## 2023-12-18 DIAGNOSIS — Z860101 Personal history of adenomatous and serrated colon polyps: Secondary | ICD-10-CM

## 2023-12-18 DIAGNOSIS — K319 Disease of stomach and duodenum, unspecified: Secondary | ICD-10-CM | POA: Insufficient documentation

## 2023-12-18 DIAGNOSIS — D122 Benign neoplasm of ascending colon: Secondary | ICD-10-CM

## 2023-12-18 DIAGNOSIS — K5140 Inflammatory polyps of colon without complications: Secondary | ICD-10-CM

## 2023-12-18 DIAGNOSIS — K2100 Gastro-esophageal reflux disease with esophagitis, without bleeding: Secondary | ICD-10-CM | POA: Diagnosis not present

## 2023-12-18 DIAGNOSIS — Z1211 Encounter for screening for malignant neoplasm of colon: Secondary | ICD-10-CM | POA: Diagnosis not present

## 2023-12-18 DIAGNOSIS — D509 Iron deficiency anemia, unspecified: Secondary | ICD-10-CM | POA: Insufficient documentation

## 2023-12-18 DIAGNOSIS — K2289 Other specified disease of esophagus: Secondary | ICD-10-CM | POA: Insufficient documentation

## 2023-12-18 HISTORY — PX: COLONOSCOPY: SHX5424

## 2023-12-18 HISTORY — PX: ESOPHAGOGASTRODUODENOSCOPY: SHX5428

## 2023-12-18 LAB — GLUCOSE, CAPILLARY: Glucose-Capillary: 150 mg/dL — ABNORMAL HIGH (ref 70–99)

## 2023-12-18 LAB — HM COLONOSCOPY

## 2023-12-18 SURGERY — COLONOSCOPY
Anesthesia: General

## 2023-12-18 MED ORDER — LACTATED RINGERS IV SOLN: INTRAVENOUS | Status: DC

## 2023-12-18 MED ORDER — PHENYLEPHRINE 80 MCG/ML (10ML) SYRINGE FOR IV PUSH (FOR BLOOD PRESSURE SUPPORT)
PREFILLED_SYRINGE | INTRAVENOUS | Status: DC | PRN
  Administered 2023-12-18: 80 ug via INTRAVENOUS
  Administered 2023-12-18: 160 ug via INTRAVENOUS
  Administered 2023-12-18: 80 ug via INTRAVENOUS
  Administered 2023-12-18: 160 ug via INTRAVENOUS

## 2023-12-18 MED ORDER — DICYCLOMINE HCL 20 MG PO TABS: 10.0000 mg | ORAL_TABLET | Freq: Three times a day (TID) | ORAL | 3 refills | Status: DC

## 2023-12-18 MED ORDER — PHENYLEPHRINE 80 MCG/ML (10ML) SYRINGE FOR IV PUSH (FOR BLOOD PRESSURE SUPPORT)
PREFILLED_SYRINGE | INTRAVENOUS | Status: AC
  Filled 2023-12-18: qty 10

## 2023-12-18 MED ORDER — DEXMEDETOMIDINE HCL IN NACL 80 MCG/20ML IV SOLN
INTRAVENOUS | Status: AC
  Filled 2023-12-18: qty 20

## 2023-12-18 MED ORDER — PROPOFOL 10 MG/ML IV BOLUS
INTRAVENOUS | Status: DC | PRN
  Administered 2023-12-18: 100 mg via INTRAVENOUS
  Administered 2023-12-18: 50 mg via INTRAVENOUS

## 2023-12-18 MED ORDER — PROPOFOL 500 MG/50ML IV EMUL
INTRAVENOUS | Status: DC | PRN
  Administered 2023-12-18: 250 ug/kg/min via INTRAVENOUS

## 2023-12-18 MED ORDER — LACTATED RINGERS IV SOLN
INTRAVENOUS | Status: DC | PRN
Start: 2023-12-18 — End: 2023-12-18

## 2023-12-18 MED ORDER — DEXMEDETOMIDINE HCL IN NACL 80 MCG/20ML IV SOLN
INTRAVENOUS | Status: DC | PRN
  Administered 2023-12-18 (×2): 8 ug via INTRAVENOUS

## 2023-12-18 MED ORDER — LIDOCAINE 2% (20 MG/ML) 5 ML SYRINGE
INTRAMUSCULAR | Status: DC | PRN
Start: 2023-12-18 — End: 2023-12-18
  Administered 2023-12-18: 100 mg via INTRAVENOUS

## 2023-12-18 NOTE — Transfer of Care (Signed)
 Immediate Anesthesia Transfer of Care Note  Patient: Mar[place]o R [place]  Procedure(s) Performed: COLONOSCOPY EGD (ESOPHAGOGASTRODUODENOSCOPY)  Patient Location: Short Stay  Anesthesia Type:General  Level of Consciousness: awake  Airway & Oxygen Therapy: Patient Spontanous Breathing  Post-op Assessment: Report given to RN and Post -op Vital signs reviewed and stable  Post vital signs: Reviewed and stable  Last Vitals:  Vitals Value Taken Time  BP    Temp    Pulse    Resp    SpO2      Last Pain:  Vitals:   12/18/23 0730  TempSrc:   PainSc: 0-No pain      Patients Stated Pain Goal: 6 (12/18/23 0722)  Complications: No notable events documented.

## 2023-12-18 NOTE — Anesthesia Preprocedure Evaluation (Addendum)
 Anesthesia Evaluation  Patient identified by MRN, date of birth, ID band Patient awake    Reviewed: Allergy & Precautions, H&P , NPO status , Patient's Chart, lab work & pertinent test results, reviewed documented beta blocker date and time   Airway Mallampati: II  TM Distance: >3 FB Neck ROM: full    Dental  (+) Caps   Pulmonary neg pulmonary ROS, shortness of breath   Pulmonary exam normal breath sounds clear to auscultation       Cardiovascular Exercise Tolerance: Good hypertension,  Rhythm:regular Rate:Normal     Neuro/Psych  PSYCHIATRIC DISORDERS  Depression  Schizophrenia   Neuromuscular disease negative neurological ROS  negative psych ROS   GI/Hepatic negative GI ROS, Neg liver ROS,GERD  ,,  Endo/Other  diabetes    Renal/GU Renal diseasenegative Renal ROS  negative genitourinary   Musculoskeletal   Abdominal   Peds  Hematology negative hematology ROS (+) Blood dyscrasia, anemia   Anesthesia Other Findings   Reproductive/Obstetrics negative OB ROS                             Anesthesia Physical Anesthesia Plan  ASA: 3  Anesthesia Plan: General   Post-op Pain Management:    Induction:   PONV Risk Score and Plan: Propofol  infusion  Airway Management Planned:   Additional Equipment:   Intra-op Plan:   Post-operative Plan:   Informed Consent: I have reviewed the patients History and Physical, chart, labs and discussed the procedure including the risks, benefits and alternatives for the proposed anesthesia with the patient or authorized representative who has indicated his/her understanding and acceptance.     Dental Advisory Given  Plan Discussed with: CRNA  Anesthesia Plan Comments:        Anesthesia Quick Evaluation

## 2023-12-18 NOTE — Op Note (Signed)
 [facility] Patient Name: Travis [name] Procedure Date: 12/18/2023 7:08 AM MRN: [id number] Date of Birth: [birth year] Attending MD: Terril Fetters , MD, [id number] CSN: [id number] Age: 63 Admit Type: Outpatient Procedure:                Colonoscopy Indications:              High risk colon cancer surveillance: Personal                            history of colonic polyps, High risk colon cancer                            surveillance: Personal history of adenoma (10 mm or                            greater in size) Providers:                Terril Fetters, MD, Crystal Page, Sharlette Dayhoff                            Technician, Technician Referring MD:              Medicines:                Monitored Anesthesia Care Complications:            No immediate complications. Estimated Blood Loss:     Estimated blood loss was minimal. Procedure:                Pre-Anesthesia Assessment:                           - Prior to the procedure, a History and Physical                            was performed, and patient medications and                            allergies were reviewed. The patient's tolerance of                            previous anesthesia was also reviewed. The risks                            and benefits of the procedure and the sedation                            options and risks were discussed with the patient.                            All questions were answered, and informed consent                            was obtained. Prior Anticoagulants: The patient has  taken no anticoagulant or antiplatelet agents                            except for aspirin . ASA Grade Assessment: III - A                            patient with severe systemic disease. After                            reviewing the risks and benefits, the patient was                            deemed in satisfactory condition to undergo the                            procedure.                            After obtaining informed consent, the colonoscope                            was passed under direct vision. Throughout the                            procedure, the patient's blood pressure, pulse, and                            oxygen saturations were monitored continuously. The                            [id number]) scope was introduced through the                            anus and advanced to the the terminal ileum. The                            colonoscopy was performed without difficulty. The                            patient tolerated the procedure well. The quality                            of the bowel preparation was evaluated using the                            BBPS [facility] Bowel Preparation Scale) with scores                            of: Right Colon = 3, Transverse Colon = 3 and Left                            Colon = 3 (entire mucosa seen well with no residual  staining, small fragments of stool or opaque                            liquid). The total BBPS score equals 9. The                            terminal ileum, ileocecal valve, appendiceal                            orifice, and rectum were photographed. Scope In: 7:45:26 AM Scope Out: 8:01:28 AM Scope Withdrawal Time: 0 hours 14 minutes 30 seconds  Total Procedure Duration: 0 hours 16 minutes 2 seconds  Findings:      Three sessile polyps were found in the transverse colon and ascending       colon. The polyps were 4 to 7 mm in size. These polyps were removed with       a cold snare. Resection and retrieval were complete.      A few diverticula were found in the left colon.      Non-bleeding internal hemorrhoids were found during retroflexion. The       hemorrhoids were small.      The terminal ileum appeared normal. Impression:               - Three 4 to 7 mm polyps in the transverse colon                            and in the ascending colon, removed  with a cold                            snare. Resected and retrieved.                           - Diverticulosis in the left colon.                           - Non-bleeding internal hemorrhoids.                           - The examined portion of the ileum was normal. Moderate Sedation:      Per Anesthesia Care Recommendation:           - Patient has a contact number available for                            emergencies. The signs and symptoms of potential                            delayed complications were discussed with the                            patient. Return to normal activities tomorrow.                            Written discharge instructions were provided to the  patient.                           - Resume previous diet.                           - Continue present medications.                           - Await pathology results.                           - Repeat colonoscopy in 3 - 5 years for                            surveillance.                           - Return to GI office as previously scheduled. Procedure Code(s):        --- Professional ---                           [phone], Colonoscopy, flexible; with removal of                            tumor(s), polyp(s), or other lesion(s) by snare                            technique Diagnosis Code(s):        --- Professional ---                           D12.3, Benign neoplasm of transverse colon (hepatic                            flexure or splenic flexure)                           D12.2, Benign neoplasm of ascending colon                           K64.8, Other hemorrhoids                           Z86.010, Personal history of colonic polyps                           K57.30, Diverticulosis of large intestine without                            perforation or abscess without bleeding CPT copyright 2022 American Medical Association. All rights reserved. The codes documented in this report are  preliminary and upon coder review may  be revised to meet current compliance requirements. Terril Fetters, MD Terril Fetters, MD 12/18/2023 8:05:57 AM This report has been signed electronically. Number of Addenda: 0

## 2023-12-18 NOTE — Op Note (Signed)
 [facility] Patient Name: Travis [name] Procedure Date: 12/18/2023 7:09 AM MRN: [id number] Date of Birth: 1961/06/23 Attending MD: Terril Fetters , MD, [id number] CSN: [id number] Age: 63 Admit Type: Outpatient Procedure:                Upper GI endoscopy Indications:              Unexplained iron deficiency anemia Providers:                Terril Fetters, MD, Crystal Page, Sharlette Dayhoff                            Technician, Technician Referring MD:              Medicines:                Monitored Anesthesia Care Complications:            No immediate complications. Estimated Blood Loss:     Estimated blood loss: none. Procedure:                Pre-Anesthesia Assessment:                           - Prior to the procedure, a History and Physical                            was performed, and patient medications and                            allergies were reviewed. The patient's tolerance of                            previous anesthesia was also reviewed. The risks                            and benefits of the procedure and the sedation                            options and risks were discussed with the patient.                            All questions were answered, and informed consent                            was obtained. Prior Anticoagulants: The patient has                            taken no anticoagulant or antiplatelet agents                            except for aspirin . ASA Grade Assessment: III - A                            patient with severe systemic disease. After  reviewing the risks and benefits, the patient was                            deemed in satisfactory condition to undergo the                            procedure.                           After obtaining informed consent, the endoscope was                            passed under direct vision. Throughout the                            procedure, the patient's blood  pressure, pulse, and                            oxygen saturations were monitored continuously. The                            [id number] ([id number] scope was introduced through the                            mouth, and advanced to the second part of duodenum.                            The upper GI endoscopy was accomplished without                            difficulty. The patient tolerated the procedure                            well. Scope In: 7:36:18 AM Scope Out: 7:41:42 AM Total Procedure Duration: 0 hours 5 minutes 24 seconds  Findings:      LA Grade A (one or more mucosal breaks less than 5 mm, not extending       between tops of 2 mucosal folds) esophagitis with no bleeding was found       in the lower third of the esophagus. Biopsies were taken with a cold       forceps for histology.      The Z-line was irregular and was found in the lower third of the       esophagus. Biopsies were taken with a cold forceps for histology.      Mild inflammation characterized by nodularity was found in the stomach.       Biopsies were taken with a cold forceps for histology.      The duodenal bulb and second portion of the duodenum were normal. Impression:               - LA Grade A reflux esophagitis with no bleeding.                            Biopsied.                           -  Z-line irregular, in the lower third of the                            esophagus. Biopsied.                           - Gastritis. Biopsied.                           - Normal duodenal bulb and second portion of the                            duodenum. Moderate Sedation:      Per Anesthesia Care Recommendation:           - Patient has a contact number available for                            emergencies. The signs and symptoms of potential                            delayed complications were discussed with the                            patient. Return to normal activities tomorrow.                             Written discharge instructions were provided to the                            patient.                           - Resume previous diet.                           - Continue present medications.                           - Await pathology results.                           -PPI daily Procedure Code(s):        --- Professional ---                           [phone], Esophagogastroduodenoscopy, flexible,                            transoral; with biopsy, single or multiple Diagnosis Code(s):        --- Professional ---                           K21.00, Gastro-esophageal reflux disease with                            esophagitis, without bleeding  K22.89, Other specified disease of esophagus                           K29.70, Gastritis, unspecified, without bleeding                           D50.9, Iron deficiency anemia, unspecified CPT copyright 2022 American Medical Association. All rights reserved. The codes documented in this report are preliminary and upon coder review may  be revised to meet current compliance requirements. Terril Fetters, MD Terril Fetters, MD 12/18/2023 8:03:42 AM This report has been signed electronically. Number of Addenda: 0

## 2023-12-18 NOTE — Discharge Instructions (Signed)

## 2023-12-18 NOTE — H&P (Signed)
 Primary Care Physician:  Kathyleen Parkins, MD Primary Gastroenterologist:  Dr. Alita Irwin  Pre-Procedure History & Physical: HPI:  Travis [name] is a 63 y.o. male with diabetes, hypertension, gout who presents for evaluation of Abdominal pain, history of colonic polyps and iron deficiency anemia. Here for upper endoscopy and colonoscopy    Patient reports abdominal pain mostly located lower abdomen rated without any relationship with food intake or defecation.  Patient feels very bloated and distended.  Patient reports chronic constipation with history of stool staining 1 to 3 days with straining.  Previously patient used to sleep 11 stools evaluated by neurology likely contributing. The patient denies having any nausea, vomiting, fever, chills, melena, hematemesis,  diarrhea, jaundice, pruritus or weight loss.   Last [id number]  - Esophageal mucosal changes suspicious for short- segment Barrett' s esophagus. Biopsied. - Normal stomach. - Normal examined duodenum.   Last Colonoscopy:2019   - The perianal and digital rectal examinations were normal. - A 14 mm polyp was found in the transverse colon. The polyp was multi- lobulated, ulcerated and pedunculated. The polyp was removed with a hot snare. Resection and retrieval were complete. - A 10 mm polyp was found in the transverse colon. The polyp was pedunculated. The polyp was removed with a hot snare. Resection and retrieval were complete. - A few small- mouthed diverticula were found in the sigmoid colon and descending colon. - Non- bleeding internal hemorrhoids were found during retroflexion. The hemorrhoids were small.   Repeat 3 years   Path  1. Surgical [P], GE junction - GASTROESOPHAGEAL MUCOSA WITH SLIGHT INFLAMMATION CONSISTENT WITH REFLUX. - NO INTESTINAL METAPLASIA, DYSPLASIA, OR MALIGNANCY. 2. Surgical [P], transverse, polyp (2) - TUBULAR ADENOMA (TWO FRAGMENTS). - BENIGN COLONIC MUCOSA WITH BENIGN LYMPHOID AGGREGATE (TWO  FRAGMENTS). - NO HIGH GRADE DYSPLASIA OR MALIGNANCY.   FHx: neg for any gastrointestinal/liver disease, no malignancies Social: neg smoking, alcohol or illicit drug use Surgical: Appendectomy and hernia repair   Last hemoglobin 12.9 MCV 78 platelet 189 normal liver enzymes creatinine 1.0 Iron saturation 5 TIBC 454  Past Medical History:  Diagnosis Date   Arthritis    Dyslipidemia    Dyspnea    On exertion   Fatigue    NAUSEA,BLOATING   H. pylori infection 09/15/2006   Hematochezia 08/30/2007   History of kidney stones    History of nuclear stress test 04/03/2009   exercise myoview ; normal pattern of perfusion; low risk scan    Hypertension    treated   NIDDM (non-insulin  dependent diabetes mellitus) 2011   type 2   Schizoaffective disorder (HCC)    Tobacco chew use    Unspecified vitamin D deficiency     Past Surgical History:  Procedure Laterality Date   APPENDECTOMY     HERNIA REPAIR     RIGHT   KNEE SURGERY     BOTH   TOTAL KNEE ARTHROPLASTY Right 10/30/2020   Procedure: TOTAL KNEE ARTHROPLASTY;  Surgeon: Liliane Rei, MD;  Location: [facility];  Service: Orthopedics;  Laterality: Right;    TRANSTHORACIC ECHOCARDIOGRAM  04/03/2009   EF=>55%; trace TR, mild pulm regurg    Prior to Admission medications   Medication Sig Start Date End Date Taking? Authorizing Provider  amLODipine  (NORVASC ) 10 MG tablet Take 1 tablet (10 mg total) by mouth daily. For high blood pressure 10/15/18  Yes Nwoko, Agnes I, NP  carbamazepine  (TEGRETOL ) 200 MG tablet Take 200 mg by mouth 2 (two) times daily. 09/28/20  Yes [provider]  cetirizine (ZYRTEC) 10 MG tablet Take 1 tablet by mouth daily.   Yes [provider]  colchicine  0.6 MG tablet Take 1 tablet (0.6 mg total) by mouth daily. 11/14/23  Yes Rice, Haig Levan, MD  FLUoxetine  (PROZAC ) 20 MG capsule Take 60 mg by mouth every morning. 10/05/20  Yes [provider]  hydrochlorothiazide (HYDRODIURIL) 25 MG  tablet Take 25 mg by mouth every evening. 10/16/21  Yes [provider]  HYDROcodone -acetaminophen  (NORCO) 10-325 MG tablet Take 1 tablet by mouth every 8 (eight) hours as needed.   Yes [provider]  hydrOXYzine  (ATARAX ) 25 MG tablet TAKE 1 TABLET BY MOUTH DAILY AS NEEDED FOR ANXIETY OR SLEEP   Yes [provider]  KLOR-CON  M20 20 MEQ tablet Take 20 mEq by mouth as needed. 09/28/20  Yes [provider]  meclizine (ANTIVERT) 25 MG tablet TAKE 1 TABLET BY MOUTH FOUR TIMES DAILY AS NEEDED FOR dizziness   Yes [provider]  metFORMIN  (GLUCOPHAGE ) 1000 MG tablet Take 1 tablet (1,000 mg total) by mouth 2 (two) times daily with a meal. 11/28/21  Yes Shamleffer, Julian Obey, MD  ondansetron  (ZOFRAN ) 4 MG tablet Take 4-8 mg by mouth every 6 (six) hours.   Yes [provider]  pramipexole  (MIRAPEX ) 1.5 MG tablet Take 1.5 mg by mouth at bedtime.   Yes [provider]  tamsulosin  (FLOMAX ) 0.4 MG CAPS capsule Take 1 capsule (0.4 mg total) by mouth daily after breakfast. For prostate health 10/15/18  Yes Asuncion Layer I, NP  traMADol  (ULTRAM ) 50 MG tablet Take 1 tablet by mouth every 6 (six) hours.   Yes [provider]  traZODone  (DESYREL ) 100 MG tablet Take 1 tablet (100 mg total) by mouth at bedtime. For sleep 10/15/18  Yes Asuncion Layer I, NP  Vitamin D, Ergocalciferol, (DRISDOL) 1.25 MG (50000 UNIT) CAPS capsule Take 50,000 Units by mouth once a week. 07/14/23  Yes [provider]  aspirin -acetaminophen -caffeine (EXCEDRIN EXTRA STRENGTH) 250-250-65 MG tablet Take 1 tablet by mouth in the morning and at bedtime.    [provider]  atorvastatin  (LIPITOR) 20 MG tablet Take 1 tablet (20 mg total) by mouth daily. For high cholesterol Patient taking differently: Take 20 mg by mouth every evening. For high cholesterol 10/15/18   Asuncion Layer I, NP  Continuous Glucose Receiver (FREESTYLE LIBRE 2 READER) DEVI by Does not apply  route.    [provider]  Continuous Glucose Sensor (FREESTYLE LIBRE 3 SENSOR) MISC Use 1 device  every 14 (fourteen) days as directed. 05/27/23   Shamleffer, Ibtehal Jaralla, MD  empagliflozin  (JARDIANCE ) 25 MG TABS tablet Half a tablet daily 10/13/23   Shamleffer, Ibtehal Jaralla, MD  FEROSUL 325 (65 Fe) MG tablet Take 325 mg by mouth 2 (two) times daily with a meal. Patient not taking: Reported on 11/19/2023 07/22/23   [provider]  FLUoxetine  (PROZAC ) 40 MG capsule TAKE ONE CAPSULE BY MOUTH DAILY along with THE 20 MG FOR a total of 60 MG    [provider]  furosemide  (LASIX ) 20 MG tablet Take 20 mg by mouth as needed. 10/12/20   [provider]  gemfibrozil (LOPID) 600 MG tablet Take 600 mg by mouth 2 (two) times daily.    [provider]  glipiZIDE  (GLUCOTROL ) 5 MG tablet TAKE 1 TABLET BY MOUTH TWICE DAILY BEFORE A MEAL 09/01/23   Shamleffer, Julian Obey, MD  hydrocortisone  (ANUSOL -HC) 25 MG suppository Place 1 suppository (25 mg total) rectally  2 (two) times daily. 11/12/23   Neil Brickell, Forbes Ida, MD  insulin  glargine, 2 Unit Dial, (TOUJEO  MAX SOLOSTAR) 300 UNIT/ML Solostar Pen Inject 45 Units into the skin daily in the afternoon. 08/29/22   Shamleffer, Ibtehal Jaralla, MD  Insulin  Pen Needle (BD PEN NEEDLE NANO 2ND GEN) 32G X 4 MM MISC USE DAILY TO inject lantus  12/26/22   Shamleffer, Ibtehal Jaralla, MD  meloxicam (MOBIC) 7.5 MG tablet Take 1 tablet by mouth daily.    [provider]  nitroGLYCERIN  (NITROSTAT ) 0.4 MG SL tablet Place 1 tablet (0.4 mg total) under the tongue every 5 (five) minutes as needed for chest pain. 10/15/18   Asuncion Layer I, NP  pantoprazole  (PROTONIX ) 40 MG tablet Take 1 tablet (40 mg total) by mouth daily. For acid reflux Patient taking differently: Take 40 mg by mouth at bedtime. For acid reflux 10/15/18   Asuncion Layer I, NP  polyethylene glycol (MIRALAX  / GLYCOLAX ) 17 g packet Take 17 g by mouth 2 (two) times daily.  11/12/23 02/10/24  Kavion Mancinas F, MD  psyllium (METAMUCIL) 58.6 % packet Take 1 packet by mouth 2 (two) times daily. 11/12/23 02/10/24  Hargis Lias, MD    Allergies as of 11/12/2023 - Review Complete 11/12/2023  Allergen Reaction Noted   Doxycycline Other (See Comments) 10/14/2014   Gabapentin  10/20/2020   Methocarbamol  10/20/2020   Lisinopril Swelling 09/07/2011   Lyrica [pregabalin]  03/11/2022   Promethazine   11/08/2021    Family History  Problem Relation Age of Onset   Stroke Father 16   Pneumonia Father    Heart failure Mother 26   Coronary artery disease Mother        also MI   Heart attack Mother    Heart attack Maternal Uncle        died in 13s   Cancer Other        x2; maternal side   Diabetes Maternal Grandmother     Social History   Socioeconomic History   Marital status: Married    Spouse name: Not on file   Number of children: 2   Years of education: Not on file   Highest education level: Not on file  Occupational History    Employer: UNEMPLOYED  Tobacco Use   Smoking status: Never    Passive exposure: Past   Smokeless tobacco: Former    Types: Snuff, Chew    Quit date: 10/26/2020   Tobacco comments:    started chewing tobacco at age 75 years old-stopped chewing and dips snuff only stopped dipping  Vaping Use   Vaping status: Never Used  Substance and Sexual Activity   Alcohol use: No    Alcohol/week: 0.0 standard drinks of alcohol   Drug use: No   Sexual activity: Not Currently  Other Topics Concern   Not on file  Social History Narrative   Are you right handed or left handed? Right   Are you currently employed ?    What is your current occupation? retired   Do you live at home alone?   Who lives with you? wife    What type of home do you live in: 1 story or 2 story? one    Caffiene 1 cup   Social Drivers of Health   Financial Resource Strain: Low Risk  (10/16/2023)   Received from [facility]   Overall Financial Resource  Strain (CARDIA)    Difficulty of Paying Living Expenses: Not hard at all  Food Insecurity: No Food Insecurity (10/16/2023)   Received from [facility]   Hunger Vital Sign    Worried About Running Out of Food in the Last Year: Never true    Ran Out of Food in the Last Year: Never true  Transportation Needs: No Transportation Needs (10/16/2023)   Received from [facility] - Transportation    Lack of Transportation (Medical): No    Lack of Transportation (Non-Medical): No  Physical Activity: Inactive (09/10/2019)   Received from [facility], [facility]   Exercise Vital Sign    Days of Exercise per Week: 0 days    Minutes of Exercise per Session: 0 min  Stress: No Stress Concern Present (09/10/2019)   Received from [facility], [facility] - Occupational Stress Questionnaire    Feeling of Stress : Not at all  Social Connections: Moderately Isolated (09/10/2019)   Received from [facility], [facility]   Social Connection and Isolation Panel [NHANES]    Frequency of Communication with Friends and Family: Twice a week    Frequency of Social Gatherings with Friends and Family: Once a week    Attends Religious Services: Never    Database administrator or Organizations: No    Attends Banker Meetings: Never    Marital Status: Married  Catering manager Violence: Not At Risk (09/10/2019)   Received from [facility], [facility]   Humiliation, Afraid, Rape, and Kick questionnaire    Fear of Current or Ex-Partner: No    Emotionally Abused: No    Physically Abused: No    Sexually Abused: No    Review of Systems: See HPI, otherwise negative ROS  Physical Exam: Vital signs in last 24 hours: Temp:  [98.1 F (36.7 C)] 98.1 F (36.7 C) (04/24 0722) Pulse Rate:  [102] 102 (04/24 0722) Resp:  [18] 18 (04/24 0722) BP: (119)/(70) 119/70 (04/24 0722) SpO2:  [97 %] 97 % (04/24 0722) Weight:   [78.5 kg] 78.5 kg (04/24 0722)   General:   Alert,  Well-developed, well-nourished, pleasant and cooperative in NAD Head:  Normocephalic and atraumatic. Eyes:  Sclera clear, no icterus.   Conjunctiva pink. Ears:  Normal auditory acuity. Nose:  No deformity, discharge,  or lesions. Msk:  Symmetrical without gross deformities. Normal posture. Extremities:  Without clubbing or edema. Neurologic:  Alert and  oriented x4;  grossly normal neurologically. Skin:  Intact without significant lesions or rashes. Psych:  Alert and cooperative. Normal mood and affect. Abdomen : Patient had RLQ mild tenderness which according to the patient present for 2-3 months   Impression/Plan:  Travis [name] is a 63 y.o. male with diabetes, hypertension, gout who presents for evaluation of Abdominal pain, history of colonic polyps and iron deficiency anemia. Proceed with upper endoscopy and colonoscopy   The risks of the procedure including infection, bleed, or perforation as well as benefits, limitations, alternatives and imponderables have been reviewed with the patient. Questions have been answered. All parties agreeable.

## 2023-12-18 NOTE — Anesthesia Procedure Notes (Signed)
 Date/Time: 12/18/2023 7:29 AM  Performed by: Sherwin Donate, CRNAPre-anesthesia Checklist: Patient identified, Emergency Drugs available, Suction available and Patient being monitored Patient Re-evaluated:Patient Re-evaluated prior to induction Oxygen Delivery Method: Nasal cannula Induction Type: IV induction Placement Confirmation: positive ETCO2 Comments: Optiflow [place] O2 used.

## 2023-12-19 ENCOUNTER — Encounter (HOSPITAL_COMMUNITY): Payer: Self-pay | Admitting: Gastroenterology

## 2023-12-19 ENCOUNTER — Encounter (INDEPENDENT_AMBULATORY_CARE_PROVIDER_SITE_OTHER): Payer: Self-pay | Admitting: *Deleted

## 2023-12-19 LAB — SURGICAL PATHOLOGY

## 2023-12-19 NOTE — Anesthesia Postprocedure Evaluation (Signed)
 Anesthesia Post Note  Patient: Travis [name]  Procedure(s) Performed: COLONOSCOPY EGD (ESOPHAGOGASTRODUODENOSCOPY)  Patient location during evaluation: Phase II Anesthesia Type: General Level of consciousness: awake Pain management: pain level controlled Vital Signs Assessment: post-procedure vital signs reviewed and stable Respiratory status: spontaneous breathing and respiratory function stable Cardiovascular status: blood pressure returned to baseline and stable Postop Assessment: no headache and no apparent nausea or vomiting Anesthetic complications: no Comments: Late entry   No notable events documented.   Last Vitals:  Vitals:   12/18/23 0722 12/18/23 0806  BP: 119/70 104/73  Pulse: (!) 102 92  Resp: 18 20  Temp: 36.7 C 36.9 C  SpO2: 97% 97%    Last Pain:  Vitals:   12/18/23 0806  TempSrc: Oral  PainSc: 0-No pain                 Coretha Dew

## 2023-12-23 NOTE — Progress Notes (Signed)
 I reviewed the pathology results. Ann, can you send her a letter with the findings as described below please? Repeat colonoscopy in 5 years  Thanks,  Kamaljit Hizer Faizan Danijah Noh, MD Gastroenterology and Hepatology [facility] Gastroenterology  ---------------------------------------------------------------------------------------------  [facility] [address], [place], [place] [address] Phone:  [phone]   12/23/23 [name], [place]   Dear Travis [name],  I am writing to inform you that the biopsies taken during your recent endoscopic examination showed:  A. STOMACH, BIOPSY:  - Gastric antral and oxyntic mucosa with mild nonspecific reactive  gastropathy  - Helicobacter pylori-like organisms are not identified on routine HE  stain   B. ESOPHAGUS, BIOPSY:  - Esophageal squamous and cardiac mucosa with no specific  histopathologic changes  - Negative for intestinal metaplasia or dysplasia   C. COLON, ASCENDING/TRANSVERSE, POLYPECTOMY:  - Tubular adenoma without high-grade dysplasia or malignancy  - Inflammatory polyp    What does this mean?  No H. Pylori bacteria in stomach , or any early cancer changes to the stomach mucosa ( Intestinal metaplasia)  Normal biopsies of the food-pipe ( no eosinophilic esophagitis )    You had a total of 3 polyps removed. The pathology came back as "tubular adenoma and inflammatory polyp." These findings are NOT cancer, but had the tubular adenoma polyps remained in your colon, they could have turned into cancer.  Given these findings, it is recommended that your next colonoscopy be performed in 5 years.   Also I value your feedback , so if you get a survey , please take the time to fill it out and thank you for choosing /[facility]  Please call us  at [phone] if you have persistent problems or have  questions about your condition that have not been fully answered at this time.  Sincerely,  Travis [name] Faizan [name], MD Gastroenterology and Hepatology

## 2023-12-30 ENCOUNTER — Encounter (INDEPENDENT_AMBULATORY_CARE_PROVIDER_SITE_OTHER): Payer: Self-pay | Admitting: *Deleted

## 2024-01-01 HISTORY — PX: ROOT CANAL: SHX2363

## 2024-01-02 ENCOUNTER — Ambulatory Visit: Attending: Internal Medicine | Admitting: Internal Medicine

## 2024-01-02 ENCOUNTER — Encounter: Payer: Self-pay | Admitting: Internal Medicine

## 2024-01-02 VITALS — BP 127/75 | HR 112 | Resp 16 | Ht 64.0 in | Wt 181.2 lb

## 2024-01-02 DIAGNOSIS — M1990 Unspecified osteoarthritis, unspecified site: Secondary | ICD-10-CM

## 2024-01-02 DIAGNOSIS — E1142 Type 2 diabetes mellitus with diabetic polyneuropathy: Secondary | ICD-10-CM

## 2024-01-02 DIAGNOSIS — Z794 Long term (current) use of insulin: Secondary | ICD-10-CM | POA: Diagnosis not present

## 2024-01-02 DIAGNOSIS — M1120 Other chondrocalcinosis, unspecified site: Secondary | ICD-10-CM | POA: Diagnosis not present

## 2024-01-02 DIAGNOSIS — M130 Polyarthritis, unspecified: Secondary | ICD-10-CM

## 2024-01-02 MED ORDER — COLCHICINE 0.6 MG PO TABS: 0.6000 mg | ORAL_TABLET | Freq: Every day | ORAL | 0 refills | Status: DC

## 2024-01-02 NOTE — Progress Notes (Signed)
 Office Visit Note  Patient: Travis [name]             Date of Birth: [birth year]           MRN: [id number]             PCP: Kathyleen Parkins, MD Referring: Kathyleen Parkins, MD Visit Date: 01/02/2024   Subjective:  Other (Patient reports bilateral hand pain and swelling, foot pain and toe numbness, left hip pain. Patient has had improvement )    Discussed the use of AI scribe software for clinical note transcription with the patient, who gave verbal consent to proceed.  History of Present Illness   Travis [name] is a 63 year old male with osteoarthritis who presents with joint pain and swelling appearing to be multifactorial with osteoarthritis, diabetic peripheral neuropathy, and CPPD.  He has experienced a flare-up of joint pain and swelling since his last visit in March, particularly affecting his hands and feet. The pain is described as 'real sensitive and hurt really bad,' with sensitivity in the toes and bottom of the feet. He has been using colchicine  daily and recently completed another round of prednisone , which he found helpful. He also experiences numbness in his fingers, particularly at night, and occasional tremors that make it difficult to hold objects.  He reports increased swelling in his legs over the past couple of weeks, for which he takes Lasix  as needed. The skin on his lower legs shows changes thought consistent with eczema, for which he applies clobetasol ointment with partial improvement. He has been using the ointment for about three weeks, primarily on his legs, but not on his feet.  He experiences pain in his left hip, which he attributes to compensating for a previously replaced knee. The pain is localized to the hip area and does not radiate down the leg. He has been attending physical therapy to strengthen his legs and prepare for potential knee surgery. He also mentions a recent root canal and the need to ensure no infections are present before proceeding  with surgery.  His toes feel 'funny' and numb, with pain worse on the bottom sides under the MTP joints.  Previous HPI 11/06/23 Travis [name] is a 63 y.o. male here for evaluation of with a recent increase in joint pain and swelling. He is accompanied by his wife, Stana Ear. He was referred for evaluation of a recent increase in joint pain and swelling affecting multiple joints with highly elevated inflammatio markers.   He has experienced a significant increase in joint pain and swelling, particularly affecting his knees, shoulders, and hips, which has severely impacted his mobility. This exacerbation began at the end of last month, leading to a hospital visit due to severe symptoms. He describes the pain as severe, stating 'I just couldn't move' and 'it was bad pain.' He was treated with oral steroids, which helped reduce the pain and swelling, and he is currently on a prednisone  taper. He has been using a walker since his hospital discharge. Significant swelling in his joints was noted during the recent exacerbation.   He has a history of skin rashes that began as small circles about a year ago and have since spread to various parts of his body, including his legs and belly. The rashes are described as itchy but not painful, and they sometimes scale up and feel like 'a piece of sandpaper.' He has been using triamcinolone cream, which was prescribed by his primary care doctor, but it  has not been very effective. The rashes were noted to have improved with vancomycin  treatment during his hospital stay but have since returned.   He has a history of back issues, including two ruptured discs, which were exacerbated by lifting a tire. He also reports a history of knee replacement and ongoing issues with his other knee, which is bone-on-bone and sometimes grinds.   He has a history of gout-like arthritis, with a previous episode following knee replacement surgery three years ago. He recalls his ankle  swelling significantly during that time, and a doctor suggested it was gout-like arthritis despite normal uric acid levels.   He has a history of working at a landfill, operating heavy equipment, which may have contributed to his joint issues.   He has also had [facility] spotted fever in the past, as he is frequently outdoors and prone to tick bites.      Labs reviewed 09/2023 RF neg ESR >130 CRP 345 Knee aspirate 2,490 WBCs 273 RBCs, Calcium  Pyrophosphate Dihydrate Crystals +   Review of Systems  Constitutional:  Positive for fatigue.  HENT:  Positive for mouth dryness. Negative for mouth sores.   Eyes:  Negative for dryness.  Respiratory:  Negative for shortness of breath.   Cardiovascular:  Negative for chest pain and palpitations.  Gastrointestinal:  Negative for blood in stool, constipation and diarrhea.  Endocrine: Positive for increased urination.  Genitourinary:  Negative for involuntary urination.  Musculoskeletal:  Positive for joint pain, gait problem, joint pain, joint swelling and morning stiffness. Negative for myalgias, muscle weakness, muscle tenderness and myalgias.  Skin:  Positive for redness. Negative for color change, rash, hair loss and sensitivity to sunlight.  Allergic/Immunologic: Negative for susceptible to infections.  Neurological:  Negative for dizziness and headaches.  Hematological:  Negative for swollen glands.  Psychiatric/Behavioral:  Positive for sleep disturbance. Negative for depressed mood. The patient is not nervous/anxious.     PMFS History:  Patient Active Problem List   Diagnosis Date Noted   Calcium  pyrophosphate deposition disease (CPPD) 01/02/2024   Gastritis and gastroduodenitis 12/18/2023   Adenomatous polyp of ascending colon 12/18/2023   Arthritis 11/19/2023   Irritable bowel syndrome with constipation 11/12/2023   Chronic idiopathic constipation 11/12/2023   Grade II hemorrhoids 11/12/2023   History of colonic polyps  11/12/2023   Iron deficiency anemia due to chronic blood loss 11/12/2023   Polyarthritis 11/06/2023   Abnormal laboratory test result 11/06/2023   Memory impairment 08/25/2023   Type 2 diabetes mellitus with diabetic polyneuropathy, with long-term current use of insulin  (HCC) 11/28/2021   Type 2 diabetes mellitus with hyperglycemia, without long-term current use of insulin  (HCC) 11/28/2021   OA (osteoarthritis) of knee 10/30/2020   Primary osteoarthritis of right knee 10/30/2020   Major depressive disorder, recurrent episode, severe, with psychosis (HCC) 04/19/2017   MDD (major depressive disorder) 04/16/2017   AKI (acute kidney injury) (HCC)    Lactic acid acidosis    Left sided numbness    Sepsis (HCC)    Elevated lactic acid level    Leukocytosis    Volume depletion    Hypotension 03/13/2017   Chest pain with moderate risk for cardiac etiology 01/13/2014   Dyspnea 01/13/2014   Family history of coronary artery disease- (mother had MI 70) 01/13/2014   Tobacco chew use    Type 2 diabetes mellitus (HCC)    Dyslipidemia    Vitamin D deficiency    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 pylori infection 09/15/2006   Hematochezia 08/30/2007   History of kidney stones    History of nuclear stress test 04/03/2009   exercise myoview ; normal pattern of perfusion; low risk scan    Hypertension    treated   NIDDM (non-insulin  dependent diabetes mellitus) 2011   type 2   Schizoaffective disorder (HCC)    Tobacco chew use    Unspecified vitamin D deficiency     Family History  Problem Relation Age of Onset   Stroke Father 41   Pneumonia Father    Heart failure Mother 64   Coronary artery disease  Mother        also MI   Heart attack Mother    Heart attack Maternal Uncle        died in 57s   Cancer Other        x2; maternal side   Diabetes Maternal Grandmother    Past Surgical History:  Procedure Laterality Date   APPENDECTOMY     COLONOSCOPY N/A 12/18/2023   Procedure: COLONOSCOPY;  Surgeon: Hargis Lias, MD;  Location: [facility];  Service: Endoscopy;  Laterality: N/A;  7:30AM;ASA 1-2   ESOPHAGOGASTRODUODENOSCOPY N/A 12/18/2023   Procedure: EGD (ESOPHAGOGASTRODUODENOSCOPY);  Surgeon: Hargis Lias, MD;  Location: [facility];  Service: Endoscopy;  Laterality: N/A;  7:30AM;ASA 1-2   HERNIA REPAIR     RIGHT   KNEE SURGERY     BOTH   ROOT CANAL  01/01/2024   ROOT CANAL  11/2023   TOTAL KNEE ARTHROPLASTY Right 10/30/2020   Procedure: TOTAL KNEE ARTHROPLASTY;  Surgeon: Liliane Rei, MD;  Location: [facility];  Service: Orthopedics;  Laterality: Right;    TRANSTHORACIC ECHOCARDIOGRAM  04/03/2009   EF=>55%; trace TR, mild pulm regurg   Social History   Social History Narrative   Are you right handed or left handed? Right   Are you currently employed ?    What is your current occupation? retired   Do you live at home alone?   Who lives with you? wife    What type of home do you live in: 1 story or 2 story? one    Caffiene 1 cup   Immunization History  Administered Date(s) Administered   Influenza-Unspecified 06/25/2022     Objective: Vital Signs: BP 127/75 (BP Location: Left Arm, Patient Position: Sitting, Cuff Size: Normal)   Pulse (!) 112   Resp 16   Ht 5\' 4"  (1.626 m)   Wt 181 lb 3.2 oz (82.2 kg)   BMI 31.10 kg/m    Physical Exam Eyes:     Conjunctiva/sclera: Conjunctivae normal.  Cardiovascular:     Rate and Rhythm: Normal rate and regular rhythm.  Pulmonary:     Effort: Pulmonary effort is normal.     Breath sounds: Normal breath sounds.  Lymphadenopathy:     Cervical: No cervical adenopathy.  Skin:    General: Skin is warm and  dry.     Comments: 1+ pitting pedal edema b/l mild erythema and skin scaling on medal sides of legs, skin rash in moccasin foot distribution on both feet, nail thickening and yellow discoloration  Neurological:  Mental Status: He is alert.  Psychiatric:        Mood and Affect: Mood normal.      Musculoskeletal Exam:  Shoulders full ROM with stiffness, no focal tenderness to pressure or palpable swelling Elbows full ROM no tenderness or swelling Wrists decreased ROM especially extension, no palpable swelling Fingers full ROM no tenderness or swelling No paraspinal tenderness to palpation over upper and lower back Hip normal internal and external rotation without pain, no tenderness to lateral hip palpation Right knee replaced, left knee limited ROM with anterior pain, also tender to pressure joint line and posterior Ankles full ROM no tenderness or swelling   Investigation: No additional findings.  Imaging: No results found.  Recent Labs: Lab Results  Component Value Date   WBC 9.0 03/11/2022   HGB 12.5 (L) 03/11/2022   PLT 189 03/11/2022   NA 135 12/16/2023   K 4.8 12/16/2023   CL 102 12/16/2023   CO2 18 (L) 12/16/2023   GLUCOSE 218 (H) 12/16/2023   BUN 22 12/16/2023   CREATININE 0.95 12/16/2023   BILITOT 0.4 03/11/2022   ALKPHOS 62 03/11/2022   AST 22 03/11/2022   ALT 28 03/11/2022   PROT 6.9 03/11/2022   ALBUMIN 3.7 03/11/2022   CALCIUM  9.5 12/16/2023   GFRAA >60 10/12/2018    Speciality Comments: No specialty comments available.  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 Seems improved today without objective  synovitis present. Appears to be tolerating low dose maintenance colchicine  well. - Continue colchicine  0.6 mg daily. - Monitor inflammation markers via blood tests ESR/ CRP. - Rechecking metabolic panel on maintenance colchcine and with increased pedal edema and diuretic use  Piriformis syndrome Left hip pain due to piriformis syndrome, muscle tightness possibly compensating for a bad knee. - Provide piriformis muscle stretches. - Consider Voltaren or lidocaine  patches. - Avoid steroid injections to prevent knee surgery delay.  Diabetic neuropathy Numbness and tingling in extremities due to high blood sugar affecting nerve endings. - Advise on limiting carbohydrate intake. - Will try and avoid repeat prednisone  use where possible  Tinea pedis Possible fungal infection with yellow, thickened toenails and moccasin distribution of skin changes. - Recommend over-the-counter antifungal cream like Lotrimin twice daily for two weeks.     Orders: Orders Placed This Encounter  Procedures   Sedimentation rate   C-reactive protein   CMP14+EGFR   Meds ordered this encounter  Medications   colchicine  0.6 MG tablet    Sig: Take 1 tablet (0.6 mg total) by mouth daily.    Dispense:  90 tablet    Refill:  0     Follow-Up Instructions: Return in about 6 months (around 07/04/2024), or if symptoms worsen or fail to improve, for OA/CPPD/neuropathy f/u 67mos/PRN.   Matt Song, MD  Note - This record has been created using [employer].  Chart creation errors have been sought, but may not always  have been located. Such creation errors do not reflect on  the standard of medical care.

## 2024-01-06 LAB — CMP14+EGFR
ALT: 9 IU/L (ref 0–44)
AST: 10 IU/L (ref 0–40)
Albumin: 4.4 g/dL (ref 3.9–4.9)
Alkaline Phosphatase: 107 IU/L (ref 44–121)
BUN/Creatinine Ratio: 28 — ABNORMAL HIGH (ref 10–24)
BUN: 24 mg/dL (ref 8–27)
Bilirubin Total: <0.2 mg/dL (ref 0.0–1.2)
CO2: 17 mmol/L — ABNORMAL LOW (ref 20–29)
Calcium: 10 mg/dL (ref 8.6–10.2)
Chloride: 102 mmol/L (ref 96–106)
Creatinine, Ser: 0.87 mg/dL (ref 0.76–1.27)
Globulin, Total: 2.6 g/dL (ref 1.5–4.5)
Glucose: 153 mg/dL — ABNORMAL HIGH (ref 70–99)
Potassium: 4.8 mmol/L (ref 3.5–5.2)
Sodium: 139 mmol/L (ref 134–144)
Total Protein: 7 g/dL (ref 6.0–8.5)
eGFR: 96 mL/min/{1.73_m2} (ref >59)

## 2024-01-06 LAB — C-REACTIVE PROTEIN: CRP: 43 mg/L — ABNORMAL HIGH (ref 0–10)

## 2024-01-06 LAB — SEDIMENTATION RATE: Sed Rate: 65 mm/h — ABNORMAL HIGH (ref 0–30)

## 2024-02-12 ENCOUNTER — Ambulatory Visit (INDEPENDENT_AMBULATORY_CARE_PROVIDER_SITE_OTHER): Admitting: Gastroenterology

## 2024-02-24 HISTORY — PX: KNEE SURGERY: SHX244

## 2024-03-01 ENCOUNTER — Other Ambulatory Visit: Payer: Self-pay | Admitting: Internal Medicine

## 2024-03-10 ENCOUNTER — Other Ambulatory Visit: Payer: Self-pay | Admitting: Internal Medicine

## 2024-03-10 ENCOUNTER — Telehealth: Payer: Self-pay

## 2024-03-10 NOTE — Telephone Encounter (Signed)
 Contact patient for a follow up appointment

## 2024-03-11 NOTE — Telephone Encounter (Signed)
 Appointment is scheduled for 04/16/2024 due to patient having upcoming procedure.

## 2024-03-12 ENCOUNTER — Encounter: Payer: BC Managed Care – PPO | Admitting: Internal Medicine

## 2024-04-16 ENCOUNTER — Encounter: Payer: Self-pay | Admitting: Internal Medicine

## 2024-04-16 ENCOUNTER — Ambulatory Visit: Admitting: Internal Medicine

## 2024-04-16 VITALS — BP 120/70 | HR 128 | Ht 64.0 in | Wt 165.0 lb

## 2024-04-16 DIAGNOSIS — Z794 Long term (current) use of insulin: Secondary | ICD-10-CM

## 2024-04-16 DIAGNOSIS — E1142 Type 2 diabetes mellitus with diabetic polyneuropathy: Secondary | ICD-10-CM | POA: Diagnosis not present

## 2024-04-16 LAB — POCT GLYCOSYLATED HEMOGLOBIN (HGB A1C): Hemoglobin A1C: 6.9 % — AB (ref 4.0–5.6)

## 2024-04-16 MED ORDER — FREESTYLE LIBRE 3 PLUS SENSOR MISC
1.0000 | 3 refills | Status: DC
Start: 2024-04-16 — End: 2024-07-19

## 2024-04-16 MED ORDER — GLIPIZIDE 5 MG PO TABS: 5.0000 mg | ORAL_TABLET | Freq: Two times a day (BID) | ORAL | 3 refills | Status: AC

## 2024-04-16 MED ORDER — METFORMIN HCL 1000 MG PO TABS: 1000.0000 mg | ORAL_TABLET | Freq: Two times a day (BID) | ORAL | 3 refills | Status: AC

## 2024-04-16 MED ORDER — EMPAGLIFLOZIN 25 MG PO TABS: ORAL_TABLET | ORAL | 3 refills | Status: AC

## 2024-04-16 NOTE — Progress Notes (Signed)
 Name: Travis [name]  MRN/ DOB: [id number], 12-Mar-1961   Age/ Sex: 63 y.o., male    PCP: Travis Satterfield, MD   Reason for Endocrinology Evaluation: Type 2 Diabetes Mellitus     Date of Initial Endocrinology Visit: 11/28/2021    PATIENT IDENTIFIER: [name] is a 63 y.o. male with a past medical history of T2DM, HTN, and dyslipidemia. The patient presented for initial endocrinology clinic visit on 11/28/2021 for consultative assistance with his diabetes management.    HPI: Travis [name]   Diagnosed with DM in many years ago          Hemoglobin A1c has ranged from 6.3% in 2018, peaking at 10.6% in 2012.   On his initial visit to our clinic his A1c was 10.4%, he was on glipizide , a carbose, and metformin .  We continued metformin , started Lantus , Trulicity , and stopped acarbose   We had to restart glipizide  June 2023 due to hyperglycemia  Trulicity  0.75 mgcaused nausea and vomiting May 2023   Intolerant to Ozempic  due to GI side effects  Jardiance  started 02/2022 Mounjaro  was discontinued 09/2023 due nausea and myalgias      SUBJECTIVE:   During the last visit (08/01/2023): A1c 7.7%     Today (04/16/24): Travis [name] is here for follow-up on diabetes management.  He is accompanied by his spouse today.  He checks his blood sugars multiple times daily through [employer]. The patient has had hypoglycemic episodes since the last clinic visit.  Patient is s/p left total knee replacement, recovering well  He self discontinued toujeo  since 09/2023 Patient does have puffy sensation in his feet, patient with peripheral neuropathy  HOME DIABETES REGIMEN: Metformin  1000 mg BID  Glipizide  5 mg BID Jardiance  25 mg, half a tab daily  Toujeo  44 units -not taking     Statin: Yes ACE-I/ARB: Yes    CONTINUOUS GLUCOSE MONITORING RECORD INTERPRETATION    Dates of Recording: 8/9 - 04/16/2024  Sensor description: Freestyle libre  Results statistics:   CGM use % of time  90  Average and SD 146/34.2  Time in range     73   %  % Time Above 180 20  % Time above 250 4   % Time Below target 3   Glycemic patterns summary: BGs trend down overnight and fluctuate during the day  Hyperglycemic episodes postprandial  Hypoglycemic episodes occurred overnight  Overnight periods: Trends down    DIABETIC COMPLICATIONS: Microvascular complications:   Denies: CKD, retinopathy,  Last eye exam: Completed 07/2021  Macrovascular complications:   Denies: CAD, PVD, CVA   PAST HISTORY: Past Medical History:  Past Medical History:  Diagnosis Date   Arthritis    Dyslipidemia    Dyspnea    On exertion   Fatigue    NAUSEA,BLOATING   H. pylori infection 09/15/2006   Hematochezia 08/30/2007   History of kidney stones    History of nuclear stress test 04/03/2009   exercise myoview ; normal pattern of perfusion; low risk scan    Hypertension    treated   NIDDM (non-insulin  dependent diabetes mellitus) 2011   type 2   Schizoaffective disorder (HCC)    Tobacco chew use    Unspecified vitamin D deficiency    Past Surgical History:  Past Surgical History:  Procedure Laterality Date   APPENDECTOMY     COLONOSCOPY N/A 12/18/2023   Procedure: COLONOSCOPY;  Surgeon: Travis Deatrice FALCON, MD;  Location: [facility];  Service: Endoscopy;  Laterality: N/A;  7:30AM;ASA 1-2   ESOPHAGOGASTRODUODENOSCOPY N/A 12/18/2023   Procedure: EGD (ESOPHAGOGASTRODUODENOSCOPY);  Surgeon: Travis Deatrice FALCON, MD;  Location: [facility];  Service: Endoscopy;  Laterality: N/A;  7:30AM;ASA 1-2   HERNIA REPAIR     RIGHT   KNEE SURGERY     BOTH   ROOT CANAL  01/01/2024   ROOT CANAL  11/2023   TOTAL KNEE ARTHROPLASTY Right 10/30/2020   Procedure: TOTAL KNEE ARTHROPLASTY;  Surgeon: Travis Lerner, MD;  Location: [facility];  Service: Orthopedics;  Laterality: Right;    TRANSTHORACIC ECHOCARDIOGRAM  04/03/2009   EF=>55%; trace TR, mild pulm regurg    Social History:  reports that  he has never smoked. He has been exposed to tobacco smoke. He quit smokeless tobacco use about 3 years ago.  His smokeless tobacco use included snuff and chew. He reports that he does not drink alcohol and does not use drugs. Family History:  Family History  Problem Relation Age of Onset   Stroke Father 74   Pneumonia Father    Heart failure Mother 37   Coronary artery disease Mother        also MI   Heart attack Mother    Heart attack Maternal Uncle        died in 42s   Cancer Other        x2; maternal side   Diabetes Maternal Grandmother      HOME MEDICATIONS: Allergies as of 04/16/2024       Reactions   Doxycycline Other (See Comments)   Reaction:  Blisters    Gabapentin    Other reaction(s): Hallucinations, Lightheadedness   Methocarbamol    Other reaction(s): Confusion, Hallucinations   Lisinopril Swelling   Lyrica [pregabalin]    Mounjaro  [tirzepatide ] Other (See Comments)   paralysis   Promethazine     Hallucinations        Medication List        Accurate as of April 16, 2024  1:56 PM. If you have any questions, ask your nurse or doctor.          amLODipine  10 MG tablet Commonly known as: NORVASC  Take 1 tablet (10 mg total) by mouth daily. For high blood pressure   atorvastatin  20 MG tablet Commonly known as: LIPITOR Take 1 tablet (20 mg total) by mouth daily. For high cholesterol What changed: when to take this   BD Pen Needle Nano 2nd Gen 32G X 4 MM Misc Generic drug: Insulin  Pen Needle USE DAILY TO inject lantus    carbamazepine  200 MG tablet Commonly known as: TEGRETOL  Take 200 mg by mouth 2 (two) times daily.   cetirizine 10 MG tablet Commonly known as: ZYRTEC Take 1 tablet by mouth daily.   clobetasol ointment 0.05 % Commonly known as: TEMOVATE Apply topically 2 (two) times daily.   colchicine  0.6 MG tablet Take 1 tablet (0.6 mg total) by mouth daily.   dicyclomine  20 MG tablet Commonly known as: BENTYL  Take 0.5 tablets (10 mg  total) by mouth 3 (three) times daily before meals.   empagliflozin  25 MG Tabs tablet Commonly known as: Jardiance  Half a tablet daily   FeroSul 325 (65 Fe) MG tablet Generic drug: ferrous sulfate Take 325 mg by mouth 2 (two) times daily with a meal.   FLUoxetine  40 MG capsule Commonly known as: PROZAC  TAKE ONE CAPSULE BY MOUTH DAILY along with THE 20 MG FOR a total of 60 MG   FLUoxetine  20 MG capsule Commonly known as: PROZAC  Take  60 mg by mouth every morning.   FreeStyle Libre 2 [place] by Does not apply route.   FreeStyle Libre 3 Sensor Misc Use 1 device  every 14 (fourteen) days as directed.   furosemide  20 MG tablet Commonly known as: LASIX  Take 20 mg by mouth as needed.   gemfibrozil 600 MG tablet Commonly known as: LOPID Take 600 mg by mouth 2 (two) times daily.   glipiZIDE  5 MG tablet Commonly known as: GLUCOTROL  TAKE 1 TABLET BY MOUTH TWICE DAILY BEFORE A MEAL   hydrochlorothiazide 25 MG tablet Commonly known as: HYDRODIURIL Take 25 mg by mouth every evening.   HYDROcodone -acetaminophen  10-325 MG tablet Commonly known as: NORCO Take 1 tablet by mouth every 8 (eight) hours as needed.   hydrocortisone  25 MG suppository Commonly known as: ANUSOL -HC Place 1 suppository (25 mg total) rectally 2 (two) times daily. What changed:  when to take this reasons to take this   hydrOXYzine  25 MG tablet Commonly known as: ATARAX  TAKE 1 TABLET BY MOUTH DAILY AS NEEDED FOR ANXIETY OR SLEEP   Klor-Con  M20 20 MEQ tablet Generic drug: potassium chloride  SA Take 20 mEq by mouth as needed.   losartan  100 MG tablet Commonly known as: COZAAR  Take 100 mg by mouth daily.   meclizine 25 MG tablet Commonly known as: ANTIVERT TAKE 1 TABLET BY MOUTH FOUR TIMES DAILY AS NEEDED FOR dizziness   metFORMIN  1000 MG tablet Commonly known as: GLUCOPHAGE  Take 1 tablet (1,000 mg total) by mouth 2 (two) times daily with a meal.   nitroGLYCERIN  0.4 MG SL tablet Commonly  known as: NITROSTAT  Place 1 tablet (0.4 mg total) under the tongue every 5 (five) minutes as needed for chest pain.   ondansetron  4 MG tablet Commonly known as: ZOFRAN  Take 4-8 mg by mouth as needed.   pantoprazole  40 MG tablet Commonly known as: PROTONIX  Take 1 tablet (40 mg total) by mouth daily. For acid reflux What changed: when to take this   pramipexole  1.5 MG tablet Commonly known as: MIRAPEX  Take 1.5 mg by mouth at bedtime.   tamsulosin  0.4 MG Caps capsule Commonly known as: FLOMAX  Take 1 capsule (0.4 mg total) by mouth daily after breakfast. For prostate health   Toujeo  Max SoloStar 300 UNIT/ML Solostar Pen Generic drug: insulin  glargine (2 Unit Dial) Inject 45 Units into the skin daily in the afternoon.   traMADol  50 MG tablet Commonly known as: ULTRAM  Take 1 tablet by mouth every 6 (six) hours.   traZODone  100 MG tablet Commonly known as: DESYREL  Take 1 tablet (100 mg total) by mouth at bedtime. For sleep   Vitamin D (Ergocalciferol) 1.25 MG (50000 UNIT) Caps capsule Commonly known as: DRISDOL Take 50,000 Units by mouth once a week.         ALLERGIES: Allergies  Allergen Reactions   Doxycycline Other (See Comments)    Reaction:  Blisters    Gabapentin     Other reaction(s): Hallucinations, Lightheadedness   Methocarbamol     Other reaction(s): Confusion, Hallucinations   Lisinopril Swelling   Lyrica [Pregabalin]    Mounjaro  [Tirzepatide ] Other (See Comments)    paralysis   Promethazine      Hallucinations        OBJECTIVE:   VITAL SIGNS: BP 120/70 (BP Location: Left Arm, Patient Position: Sitting, Cuff Size: Normal)   Pulse (!) 128   Ht 5' 4 (1.626 m)   Wt 165 lb (74.8 kg)   SpO2 97%   BMI 28.32 kg/m  PHYSICAL EXAM:  General: Pt appears well and is in NAD  Lungs: Clear with good BS bilat   Heart: RRR   Extremities:  Lower extremities - No pretibial edema patient with erythematous macules over the shins bilaterally  Neuro: MS is  good with appropriate affect, pt is alert and Ox3    DM foot exam: 18/22/2025  The skin of the feet is without sores or ulcerations, skin flakes noted The pedal pulses are 2+ on right and 2+ on left. The sensation is decreased to a screening 5.07, 10 gram monofilament bilaterally   DATA REVIEWED:  Lab Results  Component Value Date   HGBA1C 6.9 (A) 04/16/2024   HGBA1C 8.4 (A) 01/29/2023   HGBA1C 8.2 (A) 07/25/2022    Latest Reference Range & Units 01/05/24 11:01  Sodium 134 - 144 mmol/L 139  Potassium 3.5 - 5.2 mmol/L 4.8  Chloride 96 - 106 mmol/L 102  CO2 20 - 29 mmol/L 17 (L)  Glucose 70 - 99 mg/dL 846 (H)  BUN 8 - 27 mg/dL 24  Creatinine 9.23 - 8.72 mg/dL 9.12  Calcium  8.6 - 10.2 mg/dL 89.9  BUN/Creatinine Ratio 10 - 24  28 (H)  eGFR >59 mL/min/1.73 96  Alkaline Phosphatase 44 - 121 IU/L 107  Albumin 3.9 - 4.9 g/dL 4.4  AST 0 - 40 IU/L 10  ALT 0 - 44 IU/L 9  Total Protein 6.0 - 8.5 g/dL 7.0  Total Bilirubin 0.0 - 1.2 mg/dL <9.7       ASSESSMENT / PLAN / RECOMMENDATIONS:   1) Type 2 Diabetes Mellitus, Optimally controlled, With neuropathic complications - Most recent A1c of 6.9%. Goal A1c <7.0%.    -A1c is optimal -He is intolerant to small dose of Trulicity ,  Ozempic  and Mounjaro  due to GI side effects -We had decreased Jardiance  to half a tablet due to dehydration and AKI - Patient self discontinued basal insulin  February, 2025, we opted to remain off Toujeo  at this time - Patient has been noted with postprandial hypoglycemia at night, this is most likely due to taking glipizide  postmeal rather than Premeal.  I did discuss the importance of taking glipizide  15-20 minutes before breakfast and supper, if hypoglycemia persist despite taking glipizide  before supper, will need to decrease the dose to half a tablet before supper   MEDICATIONS: -Continue metformin  at 1000 mg twice daily -Continue Jardiance  25 mg, half a tablet daily -Take glipizide  5 mg, 1 tablet  before breakfast and 0.5-1 tablet before supper  EDUCATION / INSTRUCTIONS: BG monitoring instructions: Patient is instructed to check his blood sugars 3 times a day. Call [facility] if: BG persistently < 70  I reviewed the Rule of 15 for the treatment of hypoglycemia in detail with the patient. Literature supplied.   2) Diabetic complications:  Eye: Does not have known diabetic retinopathy.  Neuro/ Feet: Does  have known diabetic peripheral neuropathy. Renal: Patient does not have known baseline CKD. He is  on an ACEI/ARB at present.   3)Dyslipidemia:  - Per PCPs office  4) Elevated serum cortisol:  -He was evaluated through his PCPs office for fatigue and was noted to have an elevated serum cortisol at 19.5 (6.2-19.4) - We have attempted to proceed with 24-hour urinary cortisol but this has not been submitted   Follow-up in 4 months   I spent 25 minutes preparing to see the patient by review of recent labs, imaging and procedures, obtaining and reviewing separately obtained history, communicating with the patient/family  or caregiver, ordering medications, tests or procedures, and documenting clinical information in the EHR including the differential Dx, treatment, and any further evaluation and other management    Signed electronically by: Stefano Redgie Butts, MD  [facility]  [facility] [address] [place] [place], [place] [address] Phone: [phone] FAX: [phone]   CC: Travis Satterfield, MD [address] [place] [address] Phone: [phone]  Fax: [phone]    Return to Endocrinology clinic as below: Future Appointments  Date Time Provider Department Center  07/09/2024 11:20 AM Jeannetta, Lonni ORN, MD [facility] None  07/16/2024  8:30 AM Richie Hussar, PhD [facility] None  07/16/2024  9:30 AM [facility] None  07/27/2024 10:00 AM Richie Hussar, PhD [facility] None

## 2024-04-16 NOTE — Patient Instructions (Addendum)
-   Continue Jardiance  25 mg , half a tablet daily - Take Glipizide  5 mg , 1 tablet before Breakfast and 0.5-1 tablet before supper - Continue Metformin  1000 mg Twice daily    HOW TO TREAT LOW BLOOD SUGARS (Blood sugar LESS THAN 70 MG/DL) Please follow the RULE OF 15 for the treatment of hypoglycemia treatment (when your (blood sugars are less than 70 mg/dL)   STEP 1: Take 15 grams of carbohydrates when your blood sugar is low, which includes:  3-4 GLUCOSE TABS  OR 3-4 OZ OF JUICE OR REGULAR SODA OR ONE TUBE OF GLUCOSE GEL    STEP 2: RECHECK blood sugar in 15 MINUTES STEP 3: If your blood sugar is still low at the 15 minute recheck --> then, go back to STEP 1 and treat AGAIN with another 15 grams of carbohydrates.

## 2024-05-11 ENCOUNTER — Other Ambulatory Visit: Payer: Self-pay | Admitting: Internal Medicine

## 2024-05-11 DIAGNOSIS — M1120 Other chondrocalcinosis, unspecified site: Secondary | ICD-10-CM

## 2024-05-11 NOTE — Telephone Encounter (Signed)
 Last Fill: 01/02/2024  Labs: 03/19/2024 RBC 3.56, Hgb 9.8, Hct 31.2, MCHC 31.4, RDW 15.4, MPV 10.9, Absolute Monocytes 1.2, Creat. 0.73, Glucose 227, Albumin 2.5, AST 11  Next Visit: 07/09/2024  Last Visit: 01/02/2024  DX: Calcium  pyrophosphate deposition disease   Current Dose per office note 01/02/2024: colchicine  0.6 mg daily.   Okay to refill Colchicine ?

## 2024-05-24 ENCOUNTER — Encounter (INDEPENDENT_AMBULATORY_CARE_PROVIDER_SITE_OTHER): Payer: Self-pay | Admitting: Otolaryngology

## 2024-05-24 ENCOUNTER — Ambulatory Visit (INDEPENDENT_AMBULATORY_CARE_PROVIDER_SITE_OTHER): Admitting: Otolaryngology

## 2024-05-24 VITALS — BP 130/73 | HR 104 | Temp 98.2°F

## 2024-05-24 DIAGNOSIS — G4733 Obstructive sleep apnea (adult) (pediatric): Secondary | ICD-10-CM | POA: Diagnosis not present

## 2024-05-24 DIAGNOSIS — Z91198 Patient's noncompliance with other medical treatment and regimen for other reason: Secondary | ICD-10-CM

## 2024-05-24 DIAGNOSIS — Z789 Other specified health status: Secondary | ICD-10-CM

## 2024-05-24 NOTE — Patient Instructions (Signed)
  It was very nice to meet you today,   Please see the following link for the [facility] program   [email or web address]  This website has information about how you can connect to other patients who have had [employer] procedure

## 2024-05-24 NOTE — Progress Notes (Signed)
 ENT CONSULT:  Reason for Consult: CPAP intolerance and severe OSA   HPI: Discussed the use of AI scribe software for clinical note transcription with the patient, who gave verbal consent to proceed.  History of Present Illness Travis [name] is a 63 year old male with severe sleep apnea who presents for evaluation of the Inspire implant due to CPAP intolerance and non-compliance. He was referred by Dr. Bertell for evaluation of alternative treatment options for sleep apnea.  He has a history of severe sleep apnea, diagnosed approximately three years ago through an at-home sleep study. The study revealed frequent apneic episodes, occurring 'ninety some times during the night.'  He was prescribed a CPAP machine but has not used it for several months due to discomfort. He finds the pressure uncomfortable and it restricts his movement, particularly when needing to get up at night.  He does not smoke but has a history of chewing tobacco, which he has since quit. He is currently taking aspirin  twice a day. No history of strokes or heart attacks.   Records Reviewed:  Endo visit 04/16/24 Travis [name] is a 63 y.o. male with a past medical history of T2DM, HTN, and dyslipidemia. The patient presented for initial endocrinology clinic visit on 11/28/2021 for consultative assistance with his diabetes management.      HPI: Travis [name]    Diagnosed with DM in many years ago          Hemoglobin A1c has ranged from 6.3% in 2018, peaking at 10.6% in 2012. On his initial visit to our clinic his A1c was 10.4%, he was on glipizide , a carbose, and metformin .  We continued metformin , started Lantus , Trulicity , and stopped acarbose  We had to restart glipizide  June 2023 due to hyperglycemia  Trulicity  0.75 mgcaused nausea and vomiting May 2023  Intolerant to Ozempic  due to GI side effects Jardiance  started 02/2022 Mounjaro  was discontinued 09/2023 due nausea and myalgias     Past Medical History:   Diagnosis Date   Arthritis    Dyslipidemia    Dyspnea    On exertion   Fatigue    NAUSEA,BLOATING   H. pylori infection 09/15/2006   Hematochezia 08/30/2007   History of kidney stones    History of nuclear stress test 04/03/2009   exercise myoview ; normal pattern of perfusion; low risk scan    Hypertension    treated   NIDDM (non-insulin  dependent diabetes mellitus) 2011   type 2   Schizoaffective disorder (HCC)    Tobacco chew use    Unspecified vitamin D deficiency     Past Surgical History:  Procedure Laterality Date   APPENDECTOMY     COLONOSCOPY N/A 12/18/2023   Procedure: COLONOSCOPY;  Surgeon: Cinderella Deatrice FALCON, MD;  [facility];  Service: Endoscopy;  Laterality: N/A;  7:30AM;ASA 1-2   ESOPHAGOGASTRODUODENOSCOPY N/A 12/18/2023   Procedure: EGD (ESOPHAGOGASTRODUODENOSCOPY);  Surgeon: Cinderella Deatrice FALCON, MD;  [facility];  Service: Endoscopy;  Laterality: N/A;  7:30AM;ASA 1-2   HERNIA REPAIR     RIGHT   KNEE SURGERY     BOTH   ROOT CANAL  01/01/2024   ROOT CANAL  11/2023   TOTAL KNEE ARTHROPLASTY Right 10/30/2020   Procedure: TOTAL KNEE ARTHROPLASTY;  Surgeon: Melodi Lerner, MD;  Location: [facility];  Service: Orthopedics;  Laterality: Right;    TRANSTHORACIC ECHOCARDIOGRAM  04/03/2009   EF=>55%; trace TR, mild pulm regurg    Family History  Problem Relation Age  of Onset   Stroke Father 75   Pneumonia Father    Heart failure Mother 36   Coronary artery disease Mother        also MI   Heart attack Mother    Heart attack Maternal Uncle        died in 47s   Cancer Other        x2; maternal side   Diabetes Maternal Grandmother     Social History:  reports that he has never smoked. He has been exposed to tobacco smoke. He quit smokeless tobacco use about 3 years ago.  His smokeless tobacco use included snuff and chew. He reports that he does not drink alcohol and does not use drugs.  Allergies:  Allergies  Allergen Reactions    Doxycycline Other (See Comments)    Reaction:  Blisters    Gabapentin     Other reaction(s): Hallucinations, Lightheadedness   Methocarbamol     Other reaction(s): Confusion, Hallucinations   Lisinopril Swelling   Lyrica [Pregabalin]    Mounjaro  [Tirzepatide ] Other (See Comments)    paralysis   Promethazine      Hallucinations    Medications: I have reviewed the patient's current medications.  The PMH, PSH, Medications, Allergies, and SH were reviewed and updated.  ROS: Constitutional: Negative for fever, weight loss and weight gain. Cardiovascular: Negative for chest pain and dyspnea on exertion. Respiratory: Is not experiencing shortness of breath at rest. Gastrointestinal: Negative for nausea and vomiting. Neurological: Negative for headaches. Psychiatric: The patient is not nervous/anxious  Blood pressure 130/73, pulse (!) 104, temperature 98.2 F (36.8 C), SpO2 95%. There is no height or weight on file to calculate BMI.  PHYSICAL EXAM:  Exam: General: Well-developed, well-nourished Respiratory Respiratory effort: Equal inspiration and expiration without stridor Cardiovascular Peripheral Vascular: Warm extremities with equal color/perfusion Eyes: No nystagmus with equal extraocular motion bilaterally Neuro/Psych/Balance: Patient oriented to person, place, and time; Appropriate mood and affect; Gait is intact with no imbalance; Cranial nerves I-XII are intact Head and Face Inspection: Normocephalic and atraumatic without mass or lesion Palpation: Facial skeleton intact without bony stepoffs Salivary Glands: No mass or tenderness Facial Strength: Facial motility symmetric and full bilaterally ENT Pinna: External ear intact and fully developed External canal: Canal is patent with intact skin Tympanic Membrane: Clear and mobile External Nose: No scar or anatomic deformity Internal Nose: Septum is straight. No polyp, or purulence. Mucosal edema and erythema present.   Bilateral inferior turbinate hypertrophy.  Lips, Teeth, and gums: Mucosa and teeth intact and viable TMJ: No pain to palpation with full mobility Oral cavity/oropharynx: No erythema or exudate, no lesions present Friedman III and no tonsils  Nasopharynx: No mass or lesion with intact mucosa Hypopharynx: Intact mucosa without pooling of secretions Larynx Glottic: Full true vocal cord mobility without lesion or mass Supraglottic: Normal appearing epiglottis and AE folds Interarytenoid Space: Moderate pachydermia&edema Subglottic Space: Patent without lesion or edema Neck Neck and Trachea: Midline trachea without mass or lesion Thyroid: No mass or nodularity Lymphatics: No lymphadenopathy  Procedure: Preoperative diagnosis: OSA and CPAP intolerance   Postoperative diagnosis:   Same  Procedure: Flexible fiberoptic laryngoscopy  Surgeon: Elena Larry, MD  Anesthesia: Topical lidocaine  and Afrin Complications: None Condition is stable throughout exam  Indications and consent:  The patient presents to the clinic with above symptoms. Indirect laryngoscopy view was incomplete. Thus it was recommended that they undergo a flexible fiberoptic laryngoscopy. All of the risks, benefits, and potential complications were reviewed with  the patient preoperatively and verbal informed consent was obtained.  Procedure: The patient was seated upright in the clinic. Topical lidocaine  and Afrin were applied to the nasal cavity. After adequate anesthesia had occurred, I then proceeded to pass the flexible telescope into the nasal cavity. The nasal cavity was patent without rhinorrhea or polyp. The nasopharynx was also patent without mass or lesion. The base of tongue was visualized and was normal. There were no signs of pooling of secretions in the piriform sinuses. The true vocal folds were mobile bilaterally. There were no signs of glottic or supraglottic mucosal lesion or mass. There was moderate  interarytenoid pachydermia and post cricoid edema. The telescope was then slowly withdrawn and the patient tolerated the procedure throughout.   Studies Reviewed: CXR 03/16/21 IMPRESSION: No active cardiopulmonary disease.  Assessment/Plan: Encounter Diagnoses  Name Primary?   OSA (obstructive sleep apnea) Yes   Intolerance of continuous positive airway pressure (CPAP) ventilation     Assessment and Plan Assessment & Plan Severe obstructive sleep apnea with nonadherence to CPAP therapy due to intolerance Severe obstructive sleep apnea diagnosed three years ago with HST (no records available for review). Nonadherence to CPAP due to discomfort. Interested in [place] implant, but would like to think about it.  OSA, severe, without multilevel collapse, with failure to tolerate PAP therapy and/or more conservative measures. Presence of smaller/absent tonsils and larger tongue position (Friedman tongue position or modified Mallampati) suggests that hypopharyngeal/retrolingual collapse is contributing to the patient's OSA. Janeth, M et al. Staging of obstructive sleep apnea/hypopnea syndrome: a guide to appropriate treatment. Laryngoscope, 2004 Mar, 114(3):454-9. PMID: [id number]) Options including positional therapy, weight loss, oral appliances, PAP and surgical correction discussed. Pt is not an ideal candidate for oral appliance due to severity of OSA Pt could be a candidate for Hypoglossal nerve stimulation (Inspire therapy) pending DISE  - Ordered repeat sleep study with pulmonary  - Provided brochure on Inspire implant. - Discussed Inspire implant procedure and potential complications and risks of surgery  - Will ned drug-induced sleep endoscopy if he decides to proceed - Schedule follow-up after sleep study.      Thank you for allowing me to participate in the care of this patient. Please do not hesitate to contact me with any questions or concerns.   Elena Larry,  MD Otolaryngology [facility] Phone: [phone] Fax: [phone]    05/24/2024, 3:06 PM

## 2024-05-26 ENCOUNTER — Ambulatory Visit (INDEPENDENT_AMBULATORY_CARE_PROVIDER_SITE_OTHER): Admitting: Gastroenterology

## 2024-06-15 ENCOUNTER — Ambulatory Visit (INDEPENDENT_AMBULATORY_CARE_PROVIDER_SITE_OTHER): Admitting: Gastroenterology

## 2024-06-15 ENCOUNTER — Encounter (INDEPENDENT_AMBULATORY_CARE_PROVIDER_SITE_OTHER): Payer: Self-pay | Admitting: Gastroenterology

## 2024-06-15 VITALS — BP 117/67 | HR 109 | Temp 97.9°F | Ht 64.0 in | Wt 176.9 lb

## 2024-06-15 DIAGNOSIS — K5904 Chronic idiopathic constipation: Secondary | ICD-10-CM

## 2024-06-15 DIAGNOSIS — R109 Unspecified abdominal pain: Secondary | ICD-10-CM

## 2024-06-15 DIAGNOSIS — K581 Irritable bowel syndrome with constipation: Secondary | ICD-10-CM

## 2024-06-15 DIAGNOSIS — Z8711 Personal history of peptic ulcer disease: Secondary | ICD-10-CM | POA: Diagnosis not present

## 2024-06-15 DIAGNOSIS — D500 Iron deficiency anemia secondary to blood loss (chronic): Secondary | ICD-10-CM

## 2024-06-15 DIAGNOSIS — K649 Unspecified hemorrhoids: Secondary | ICD-10-CM

## 2024-06-15 DIAGNOSIS — Z860100 Personal history of colon polyps, unspecified: Secondary | ICD-10-CM

## 2024-06-15 MED ORDER — PANTOPRAZOLE SODIUM 40 MG PO TBEC: 40.0000 mg | DELAYED_RELEASE_TABLET | Freq: Two times a day (BID) | ORAL | 3 refills | Status: AC

## 2024-06-15 MED ORDER — LINACLOTIDE 145 MCG PO CAPS: 145.0000 ug | ORAL_CAPSULE | Freq: Every day | ORAL | 1 refills | Status: DC

## 2024-06-15 NOTE — Progress Notes (Signed)
 [name] , M.D. Gastroenterology & Hepatology [facility] [address] [place], [place] [address] Primary Care Physician: Marlee Lynwood NOVAK, MD [address] [place] [address]  Chief Complaint: Iron deficiency anemia, hematochezia  History of Present Illness: Travis [name] is a 63 y.o. male with diabetes, hypertension, gout who presents for evaluation of Iron deficiency anemia, hematochezia  Patient underwent bidirectional endoscopy recently with negative upper endoscopy and colonoscopy with diminutive tubular adenoma polyps removed.  Patient has been taking iron supplementation since  Patient reports that he underwent knee surgery in July and since that time has been taking aspirin  325 mg twice daily including ibuprofen 1-2 times daily.  Patient was noticing fresh blood in the stool  .Patient reports abdominal pain mostly located lower abdomen rated without any relationship with food intake or defecation.    Patient reports chronic constipation with history of stool staining 1 to 3 days with straining.    Last EGD: 11/2023   - Z- line irregular, in the lower third of the esophagus. Biopsied. - Gastritis. Biopsied. - Normal duodenal bulb and second portion of the duodenum.  2019  - Esophageal mucosal changes suspicious for short- segment Barrett' s esophagus. Biopsied. - Normal stomach. - Normal examined duodenum.  Last Colonoscopy: 11/2023  - Three 4 to 7 mm polyps in the transverse colon and in the ascending colon, removed with a cold snare. Resected and retrieved. - Diverticulosis in the left colon. - Non- bleeding internal hemorrhoids. - The examined portion of the ileum was normal 2019  REPEAT 5 years  (2030)  A. STOMACH, BIOPSY:  - Gastric antral and oxyntic mucosa with mild nonspecific reactive  gastropathy  - Helicobacter pylori-like organisms are not identified on routine HE  stain   B. ESOPHAGUS, BIOPSY:  -  Esophageal squamous and cardiac mucosa with no specific  histopathologic changes  - Negative for intestinal metaplasia or dysplasia   C. COLON, ASCENDING/TRANSVERSE, POLYPECTOMY:  - Tubular adenoma without high-grade dysplasia or malignancy  - Inflammatory polyp    - The perianal and digital rectal examinations were normal. - A 14 mm polyp was found in the transverse colon. The polyp was multi- lobulated, ulcerated and pedunculated. The polyp was removed with a hot snare. Resection and retrieval were complete. - A 10 mm polyp was found in the transverse colon. The polyp was pedunculated. The polyp was removed with a hot snare. Resection and retrieval were complete. - A few small- mouthed diverticula were found in the sigmoid colon and descending colon. - Non- bleeding internal hemorrhoids were found during retroflexion. The hemorrhoids were small.  Repeat 3 years  Path  1. Surgical [P], GE junction - GASTROESOPHAGEAL MUCOSA WITH SLIGHT INFLAMMATION CONSISTENT WITH REFLUX. - NO INTESTINAL METAPLASIA, DYSPLASIA, OR MALIGNANCY. 2. Surgical [P], transverse, polyp (2) - TUBULAR ADENOMA (TWO FRAGMENTS). - BENIGN COLONIC MUCOSA WITH BENIGN LYMPHOID AGGREGATE (TWO FRAGMENTS). - NO HIGH GRADE DYSPLASIA OR MALIGNANCY.  FHx: neg for any gastrointestinal/liver disease, no malignancies Social: neg smoking, alcohol or illicit drug use Surgical: Appendectomy and hernia repair  Last hemoglobin 12.9 MCV 78 platelet 189 normal liver enzymes creatinine 1.0 Iron saturation 5 TIBC 454  Past Medical History: Past Medical History:  Diagnosis Date   Arthritis    Dyslipidemia    Dyspnea    On exertion   Fatigue    NAUSEA,BLOATING   H. pylori infection 09/15/2006   Hematochezia 08/30/2007   History of kidney stones  History of nuclear stress test 04/03/2009   exercise myoview ; normal pattern of perfusion; low risk scan    Hypertension    treated   NIDDM (non-insulin  dependent diabetes mellitus)  2011   type 2   Schizoaffective disorder (HCC)    Tobacco chew use    Unspecified vitamin D deficiency     Past Surgical History: Past Surgical History:  Procedure Laterality Date   APPENDECTOMY     COLONOSCOPY N/A 12/18/2023   Procedure: COLONOSCOPY;  Surgeon: Cinderella Deatrice FALCON, MD;  [facility];  Service: Endoscopy;  Laterality: N/A;  7:30AM;ASA 1-2   ESOPHAGOGASTRODUODENOSCOPY N/A 12/18/2023   Procedure: EGD (ESOPHAGOGASTRODUODENOSCOPY);  Surgeon: Cinderella Deatrice FALCON, MD;  [facility];  Service: Endoscopy;  Laterality: N/A;  7:30AM;ASA 1-2   HERNIA REPAIR     RIGHT   KNEE SURGERY     BOTH   ROOT CANAL  01/01/2024   ROOT CANAL  11/2023   TOTAL KNEE ARTHROPLASTY Right 10/30/2020   Procedure: TOTAL KNEE ARTHROPLASTY;  Surgeon: Melodi Lerner, MD;  Location: [facility];  Service: Orthopedics;  Laterality: Right;    TRANSTHORACIC ECHOCARDIOGRAM  04/03/2009   EF=>55%; trace TR, mild pulm regurg    Family History: Family History  Problem Relation Age of Onset   Stroke Father 62   Pneumonia Father    Heart failure Mother 35   Coronary artery disease Mother        also MI   Heart attack Mother    Heart attack Maternal Uncle        died in 66s   Cancer Other        x2; maternal side   Diabetes Maternal Grandmother     Social History: Social History   Tobacco Use  Smoking Status Never   Passive exposure: Past  Smokeless Tobacco Former   Types: Snuff, Chew   Quit date: 10/26/2020  Tobacco Comments   started chewing tobacco at age 36 years old-stopped chewing and dips snuff only stopped dipping   Social History   Substance and Sexual Activity  Alcohol Use No   Alcohol/week: 0.0 standard drinks of alcohol   Social History   Substance and Sexual Activity  Drug Use No    Allergies: Allergies  Allergen Reactions   Doxycycline Other (See Comments)    Reaction:  Blisters    Gabapentin     Other reaction(s): Hallucinations, Lightheadedness    Methocarbamol     Other reaction(s): Confusion, Hallucinations   Lisinopril Swelling   Lyrica [Pregabalin]    Mounjaro  [Tirzepatide ] Other (See Comments)    paralysis   Promethazine      Hallucinations    Medications: Current Outpatient Medications  Medication Sig Dispense Refill   amLODipine  (NORVASC ) 10 MG tablet Take 1 tablet (10 mg total) by mouth daily. For high blood pressure 10 tablet 0   atenolol (TENORMIN) 25 MG tablet Take 25 mg by mouth daily.     atorvastatin  (LIPITOR) 20 MG tablet Take 1 tablet (20 mg total) by mouth daily. For high cholesterol 7 tablet 0   carbamazepine  (TEGRETOL ) 200 MG tablet Take 200 mg by mouth 2 (two) times daily.     cetirizine (ZYRTEC) 10 MG tablet Take 1 tablet by mouth daily.     clobetasol ointment (TEMOVATE) 0.05 % Apply topically 2 (two) times daily.     colchicine  0.6 MG tablet TAKE 1 TABLET BY MOUTH ONCE DAILY 90 tablet 0   Continuous Glucose Sensor (FREESTYLE LIBRE 3  PLUS SENSOR) MISC 1 Device by Other route every 14 (fourteen) days. Change sensor every 15 days. 6 each 3   cyclobenzaprine  (FLEXERIL ) 5 MG tablet Take 5 mg by mouth.     empagliflozin  (JARDIANCE ) 25 MG TABS tablet Half a tablet daily (Patient taking differently: 12.5 mg. Half a tablet daily) 90 tablet 3   FEROSUL 325 (65 Fe) MG tablet Take 325 mg by mouth 2 (two) times daily with a meal.     FLUoxetine  (PROZAC ) 20 MG capsule Take 60 mg by mouth every morning.     FLUoxetine  (PROZAC ) 40 MG capsule TAKE ONE CAPSULE BY MOUTH DAILY along with THE 20 MG FOR a total of 60 MG     furosemide  (LASIX ) 20 MG tablet Take 20 mg by mouth as needed.     gemfibrozil (LOPID) 600 MG tablet Take 600 mg by mouth 2 (two) times daily.     glipiZIDE  (GLUCOTROL ) 5 MG tablet Take 1 tablet (5 mg total) by mouth 2 (two) times daily before a meal. 180 tablet 3   hydrochlorothiazide (HYDRODIURIL) 25 MG tablet Take 25 mg by mouth every evening.     hydrocortisone  (ANUSOL -HC) 25 MG suppository Place 1  suppository (25 mg total) rectally 2 (two) times daily. 12 suppository 0   hydrOXYzine  (ATARAX ) 25 MG tablet TAKE 1 TABLET BY MOUTH DAILY AS NEEDED FOR ANXIETY OR SLEEP     KLOR-CON  M20 20 MEQ tablet Take 20 mEq by mouth as needed.     losartan  (COZAAR ) 100 MG tablet Take 100 mg by mouth daily.     metFORMIN  (GLUCOPHAGE ) 1000 MG tablet Take 1 tablet (1,000 mg total) by mouth 2 (two) times daily with a meal. 180 tablet 3   nitroGLYCERIN  (NITROSTAT ) 0.4 MG SL tablet Place 1 tablet (0.4 mg total) under the tongue every 5 (five) minutes as needed for chest pain.  0   ondansetron  (ZOFRAN ) 4 MG tablet Take 4-8 mg by mouth as needed.     oxyCODONE  (OXY IR/ROXICODONE ) 5 MG immediate release tablet Take 5 mg by mouth.     pantoprazole  (PROTONIX ) 40 MG tablet Take 1 tablet (40 mg total) by mouth daily. For acid reflux 10 tablet 0   pramipexole  (MIRAPEX ) 1.5 MG tablet Take 1.5 mg by mouth at bedtime.     tamsulosin  (FLOMAX ) 0.4 MG CAPS capsule Take 1 capsule (0.4 mg total) by mouth daily after breakfast. For prostate health 7 capsule 0   traZODone  (DESYREL ) 100 MG tablet Take 1 tablet (100 mg total) by mouth at bedtime. For sleep 30 tablet 0   Vitamin D, Ergocalciferol, (DRISDOL) 1.25 MG (50000 UNIT) CAPS capsule Take 50,000 Units by mouth once a week.     meclizine (ANTIVERT) 25 MG tablet TAKE 1 TABLET BY MOUTH FOUR TIMES DAILY AS NEEDED FOR dizziness     No current facility-administered medications for this visit.    Review of Systems: GENERAL: negative for malaise, night sweats HEENT: No changes in hearing or vision, no nose bleeds or other nasal problems. NECK: Negative for lumps, goiter, pain and significant neck swelling RESPIRATORY: Negative for cough, wheezing CARDIOVASCULAR: Negative for chest pain, leg swelling, palpitations, orthopnea GI: SEE HPI MUSCULOSKELETAL: Negative for joint pain or swelling, back pain, and muscle pain. SKIN: Negative for lesions, rash HEMATOLOGY Negative for  prolonged bleeding, bruising easily, and swollen nodes. ENDOCRINE: Negative for cold or heat intolerance, polyuria, polydipsia and goiter. NEURO: negative for tremor, gait imbalance, syncope and seizures. The remainder of the review  of systems is noncontributory.   Physical Exam: BP 117/67   Pulse (!) 109   Temp 97.9 F (36.6 C)   Ht 5' 4 (1.626 m)   Wt 176 lb 14.4 oz (80.2 kg)   BMI 30.36 kg/m  GENERAL: The patient is AO x3, in no acute distress. HEENT: Head is normocephalic and atraumatic. EOMI are intact. Mouth is well hydrated and without lesions. NECK: Supple. No masses LUNGS: Clear to auscultation. No presence of rhonchi/wheezing/rales. Adequate chest expansion HEART: RRR, normal s1 and s2. ABDOMEN: Soft, nontender, no guarding, no peritoneal signs, and nondistended. BS +. No masses.  Imaging/Labs: as above     Latest Ref Rng & Units 03/11/2022    5:21 PM 11/08/2021    3:15 PM 11/22/2020   10:04 PM  CBC  WBC 4.0 - 10.5 K/uL 9.0  9.6  15.3   Hemoglobin 13.0 - 17.0 g/dL 87.4  86.7  87.3   Hematocrit 39.0 - 52.0 % 38.0  41.7  40.5   Platelets 150 - 400 K/uL 189  189  303    No results found for: IRON, TIBC, FERRITIN  I personally reviewed and interpreted the available labs, imaging and endoscopic files. CT 07/2023  IMPRESSION:  No acute findings in the abdomen or pelvis.   Aortic atherosclerosis.   Mild prostate enlargement.   Small left inguinal hernia containing fat.    Impression and Plan:  Travis [name] is a 63 y.o. male with diabetes, hypertension, gout who presents for evaluation of Iron deficiency anemia, hematochezia  #Abdominal pain with constipation   This is likely irritable bowel syndrome constipation type or chronic idiopathic constipation leading to abdominal discomfort  Patient reports Bristol stool scale 1-3 with straining and hard stool  This is likely exacerbated by iron supplementation  Patient had a CT scan last December and  ultrasound last year without any overt pathology explaining abdominal discomfort  - Explained presumed etiology of IBS symptoms. Patient was counseled about the benefit of implementing a low FODMAP to improve symptoms and recurrent episodes. A dietary list was provided to the patient. Also, the patient was counseled about the benefit of avoiding stressing situations and potential environmental triggers leading to symptomatology.  -  IBGard 1 tablet every 8-12 hours as needed -Ensure adequate fluid intake: Aim for 8 glasses of water daily. -Linzess 145mcg   #Iron deficiency anemia #History of PUD  #Hemorrhoids  Previously patient had a hemoglobin of 8 but since starting iron supplementation has come up to 12, dropped after recent knee surgery .  Had ron saturation 5 TIBC 454.  Recent EGD and Colonoscopy only with dimunitive polyps   Patient has been taking extensive NSAIDs with aspirin  325 twice daily since past 2 months.  He was advised to take it for 28 days but was confused and continued to take it along with ibuprofen  I will check CBC to ensure there is not significant drop in hemoglobin   Stop using high dose aspirin  including Goody/BC powders, NSAIDs such as Aleve, ibuprofen, naproxen, Motrin, Voltaren or Advil (even the topical ones)  Protonix  twice daily for 30 days than make it once daily  #Colonic polyps  colonoscopy 2019 with advanced adenoma, 2025 diminutive polyps . Next repeat 2030    All questions were answered.      Travis Stansell Faizan Aly Hauser, MD Gastroenterology and Hepatology [facility]   This chart has been completed using [facility] Dictation software, and while attempts have been made to  ensure accuracy , certain words and phrases may not be transcribed as intended

## 2024-06-15 NOTE — Patient Instructions (Signed)
 It was very nice to meet you today, as dicussed with will plan for the following :  1) Stop using high dose aspirin  including Goody/BC powders, NSAIDs such as Aleve, ibuprofen, naproxen, Motrin, Voltaren or Advil (even the topical ones)  2) protonix  twice daily for 30 days than make it once daily  3) Blood work  4) Linzess for constipation and abdominal pain

## 2024-06-17 ENCOUNTER — Telehealth (INDEPENDENT_AMBULATORY_CARE_PROVIDER_SITE_OTHER): Payer: Self-pay | Admitting: Gastroenterology

## 2024-06-17 NOTE — Telephone Encounter (Signed)
 No significant drop in hemoglobin but continues to be iron deficient. Continue PO iron every other day  , and avoid using high dose aspirin  as patient was taking for months (600mg + daily)

## 2024-06-17 NOTE — Telephone Encounter (Signed)
 Telephone Encounter by Cinderella Deatrice FALCON, MD at 06/17/2024  8:56 AM  Author: Cinderella Deatrice FALCON, MD Service: Gastroenterology Author Type: Physician  Filed: 06/17/2024  8:57 AM Encounter Date: 06/17/2024 Note Type: Telephone Encounter  Status: Signed Editor: Cinderella Deatrice FALCON, MD (Physician)  No significant drop in hemoglobin but continues to be iron deficient. Continue PO iron every other day  , and avoid using high dose aspirin  as patient was taking for months (600mg + daily)          Pt wife contacted and verbalized understanding.

## 2024-06-29 NOTE — Progress Notes (Signed)
 Office Visit Note  Patient: Travis [name]             Date of Birth: 1961/08/07           MRN: [id number]             PCP: Travis Lynwood NOVAK, MD Referring: Travis Satterfield, MD Visit Date: 07/09/2024   Subjective:   History of Present Illness:   Discussed the use of AI scribe software for clinical note transcription with the patient, who gave verbal consent to proceed.  History of Present Illness   Travis [name] is a 63 y.o. 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   He underwent a knee replacement on July 23rd.Travis [name] He alternates between Tylenol  and ibuprofen for pain management and has oxycodone  prescribed by his knee doctor, which he tries to avoid using.  He experiences hip pain, particularly in the mornings and after prolonged activity. He has been using Flexeril  for his knee, which sometimes helps with the hip pain. He avoids using pain medication as much as possible.  He has a history of pseudogout and has been taking colchicine , which has helped prevent flare-ups. He has not experienced significant joint swelling recently. He also has a history of osteoarthritis, particularly affecting his hands, which are sometimes sore but not swollen.  He reports a recent episode of diarrhea lasting three to four days, which may have contributed to recent mildly abnormal labs including elevated potassium level of 5.2 and elevated alk phos. He is not taking potassium supplements but consumes foods high in potassium like potatoes and bananas. He has reduced his intake of soda, drinking mostly water.     He is concerned about peripheral neuropathy in his feet, questioning if it could lead to damage or loss of toes. His feet often feel cold, and he has difficulty feeling them, raising concerns about potential injuries. His wife assists in checking his feet regularly  due to his limited ability to do so himself.    Previous HPI 01/02/2024 Travis [name] is a 63 year old male with osteoarthritis who presents with joint pain and swelling appearing to be multifactorial with osteoarthritis, diabetic peripheral neuropathy, and CPPD.   He has experienced a flare-up of joint pain and swelling since his last visit in March, particularly affecting his hands and feet. The pain is described as 'real sensitive and hurt really bad,' with sensitivity in the toes and bottom of the feet. He has been using colchicine  daily and recently completed another round of prednisone , which he found helpful. He also experiences numbness in his fingers, particularly at night, and occasional tremors that make it difficult to hold objects.   He reports increased swelling in his legs over the past couple of weeks, for which he takes Lasix  as needed. The skin on his lower legs shows changes thought consistent with eczema, for which he applies clobetasol ointment with partial improvement. He has been using the ointment for about three weeks, primarily on his legs, but not on his feet.   He experiences pain in his left hip, which he attributes to compensating for a previously replaced knee. The pain is localized to the hip area and does not radiate down the leg. He has been attending physical therapy to strengthen his legs and prepare for potential knee surgery. He also mentions a recent root canal and the need  to ensure no infections are present before proceeding with surgery.   His toes feel 'funny' and numb, with pain worse on the bottom sides under the MTP joints.   Previous HPI 11/06/23 Travis [name] is a 63 y.o. male here for evaluation of with a recent increase in joint pain and swelling. He is accompanied by his wife, Travis [name]. He was referred for evaluation of a recent increase in joint pain and swelling affecting multiple joints with highly elevated inflammatio markers.   He has  experienced a significant increase in joint pain and swelling, particularly affecting his knees, shoulders, and hips, which has severely impacted his mobility. This exacerbation began at the end of last month, leading to a hospital visit due to severe symptoms. He describes the pain as severe, stating 'I just couldn't move' and 'it was bad pain.' He was treated with oral steroids, which helped reduce the pain and swelling, and he is currently on a prednisone  taper. He has been using a walker since his hospital discharge. Significant swelling in his joints was noted during the recent exacerbation.   He has a history of skin rashes that began as small circles about a year ago and have since spread to various parts of his body, including his legs and belly. The rashes are described as itchy but not painful, and they sometimes scale up and feel like 'a piece of sandpaper.' He has been using triamcinolone cream, which was prescribed by his primary care doctor, but it has not been very effective. The rashes were noted to have improved with vancomycin  treatment during his hospital stay but have since returned.   He has a history of back issues, including two ruptured discs, which were exacerbated by lifting a tire. He also reports a history of knee replacement and ongoing issues with his other knee, which is bone-on-bone and sometimes grinds.   He has a history of gout-like arthritis, with a previous episode following knee replacement surgery three years ago. He recalls his ankle swelling significantly during that time, and a doctor suggested it was gout-like arthritis despite normal uric acid levels.   He has a history of working at a landfill, operating heavy equipment, which may have contributed to his joint issues.   He has also had [facility] spotted fever in the past, as he is frequently outdoors and prone to tick bites.      Labs reviewed 09/2023 RF neg ESR >130 CRP 345 Knee aspirate 2,490 WBCs  273 RBCs, Calcium  Pyrophosphate Dihydrate Crystals +   Review of Systems  Constitutional:  Positive for fatigue.  HENT:  Positive for mouth dryness. Negative for mouth sores.   Eyes:  Negative for dryness.  Respiratory:  Positive for difficulty breathing. Negative for shortness of breath.   Cardiovascular:  Negative for chest pain and palpitations.  Gastrointestinal:  Positive for blood in stool, constipation and diarrhea.  Endocrine: Negative for increased urination.  Genitourinary:  Negative for involuntary urination.  Musculoskeletal:  Positive for joint pain, joint pain, joint swelling, myalgias, muscle weakness, morning stiffness, muscle tenderness and myalgias. Negative for gait problem.  Skin:  Positive for rash. Negative for color change, hair loss and sensitivity to sunlight.  Allergic/Immunologic: Negative for susceptible to infections.  Neurological:  Positive for light-headedness and headaches. Negative for dizziness.  Hematological:  Negative for swollen glands.  Psychiatric/Behavioral:  Negative for depressed mood and sleep disturbance. The patient is not nervous/anxious.     PMFS History:  Patient Active Problem List  Diagnosis Date Noted   Calcium  pyrophosphate deposition disease (CPPD) 01/02/2024   Gastritis and gastroduodenitis 12/18/2023   Adenomatous polyp of ascending colon 12/18/2023   Arthritis 11/19/2023   Irritable bowel syndrome with constipation 11/12/2023   Chronic idiopathic constipation 11/12/2023   Grade II hemorrhoids 11/12/2023   History of colonic polyps 11/12/2023   Iron deficiency anemia due to chronic blood loss 11/12/2023   Polyarthritis 11/06/2023   Abnormal laboratory test result 11/06/2023   Memory impairment 08/25/2023   Type 2 diabetes mellitus with diabetic polyneuropathy, with long-term current use of insulin  (HCC) 11/28/2021   Type 2 diabetes mellitus with hyperglycemia, without long-term current use of insulin  (HCC) 11/28/2021   OA  (osteoarthritis) of knee 10/30/2020   Primary osteoarthritis of right knee 10/30/2020   Major depressive disorder, recurrent episode, severe, with psychosis (HCC) 04/19/2017   MDD (major depressive disorder) 04/16/2017   AKI (acute kidney injury)    Lactic acid acidosis    Left sided numbness    Sepsis (HCC)    Elevated lactic acid level    Leukocytosis    Volume depletion    Hypotension 03/13/2017   Chest pain with moderate risk for cardiac etiology 01/13/2014   Dyspnea 01/13/2014   Family history of coronary artery disease- (mother had MI 8) 01/13/2014   Tobacco chew use    Type 2 diabetes mellitus (HCC)    Dyslipidemia    Vitamin D deficiency    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 pylori infection 09/15/2006   Hematochezia 08/30/2007   History of kidney stones    History of nuclear stress test 04/03/2009   exercise myoview ; normal pattern of perfusion; low risk scan    Hypertension    treated   NIDDM (non-insulin  dependent diabetes mellitus) 2011   type 2   Schizoaffective disorder (HCC)    Tobacco chew use    Unspecified vitamin D deficiency     Family History  Problem Relation Age of Onset   Stroke Father 17   Pneumonia Father    Heart failure Mother 32   Coronary artery disease Mother        also MI   Heart attack Mother    Heart attack Maternal Uncle        died in 74s   Cancer Other        x2; maternal side   Diabetes Maternal Grandmother    Past Surgical History:  Procedure Laterality Date   APPENDECTOMY     COLONOSCOPY N/A 12/18/2023   Procedure: COLONOSCOPY;  Surgeon: Cinderella Deatrice FALCON, MD;  Location: [facility];  Service: Endoscopy;  Laterality: N/A;   7:30AM;ASA 1-2   ESOPHAGOGASTRODUODENOSCOPY N/A 12/18/2023   Procedure: EGD (ESOPHAGOGASTRODUODENOSCOPY);  Surgeon: Cinderella Deatrice FALCON, MD;  Location: [facility];  Service: Endoscopy;  Laterality: N/A;  7:30AM;ASA 1-2   HERNIA REPAIR     RIGHT   KNEE SURGERY Left 02/2024   ROOT CANAL  01/01/2024   ROOT CANAL  11/2023   TOTAL KNEE ARTHROPLASTY Right 10/30/2020   Procedure: TOTAL KNEE ARTHROPLASTY;  Surgeon: Melodi Lerner, MD;  Location: [facility];  Service: Orthopedics;  Laterality: Right;   TRANSTHORACIC ECHOCARDIOGRAM  04/03/2009   EF=>55%; trace TR, mild pulm regurg   Social History   Social History Narrative   Are you right handed or left handed? Right   Are you currently employed ?    What is your current occupation? retired   Do you live at home alone?   Who lives with you? wife    What type of home do you live in: 1 story or 2 story? one    Caffiene 1 cup   Immunization History  Administered Date(s) Administered   Influenza-Unspecified 06/25/2022     Objective: Vital Signs: BP 114/72   Pulse 90   Temp 98.1 F (36.7 C)   Resp 16   Ht 5' 4 (1.626 m)   Wt 176 lb 3.2 oz (79.9 kg)   BMI 30.24 kg/m    Physical Exam Eyes:     Conjunctiva/sclera: Conjunctivae normal.  Cardiovascular:     Rate and Rhythm: Normal rate and regular rhythm.  Pulmonary:     Effort: Pulmonary effort is normal.     Breath sounds: Normal breath sounds.  Lymphadenopathy:     Cervical: No cervical adenopathy.  Skin:    General: Skin is warm and dry.     Comments: Trace pedal edema  Neurological:     Mental Status: He is alert.  Psychiatric:        Mood and Affect: Mood normal.      Musculoskeletal Exam:  Shoulders full ROM with stiffness, no focal tenderness to pressure or palpable swelling Elbows full ROM no tenderness or swelling Wrists decreased ROM especially extension, no palpable swelling Fingers full ROM no tenderness or swelling No paraspinal tenderness to palpation  over upper and lower back Left hip laterla tenderness to pressure at greater trochanter, pain with FADIR ROM Right knee replaced, left knee slightly limited extension ROM postoperative changes Ankles full ROM no tenderness or swelling  Investigation: No additional findings.  Imaging: No results found.  Recent Labs: Lab Results  Component Value Date   WBC 9.0 03/11/2022   HGB 12.5 (L) 03/11/2022   PLT 189 03/11/2022   NA 139 01/05/2024   K 4.8 01/05/2024   CL 102 01/05/2024   CO2 17 (L) 01/05/2024   GLUCOSE 153 (H) 01/05/2024   BUN 24 01/05/2024   CREATININE 0.87 01/05/2024   BILITOT <0.2 01/05/2024   ALKPHOS 107 01/05/2024   AST 10 01/05/2024   ALT 9 01/05/2024   PROT 7.0 01/05/2024   ALBUMIN 4.4 01/05/2024   CALCIUM  10.0 01/05/2024   GFRAA >60 10/12/2018    Speciality Comments: No specialty comments available.  Procedures:  No procedures performed Allergies: Doxycycline, Gabapentin, Methocarbamol, Mounjaro  [tirzepatide ], Lisinopril, Lyrica [pregabalin], and Promethazine    Assessment / Plan:     Visit Diagnoses: Calcium  pyrophosphate deposition disease (CPPD) No major flare-ups for a couple of months. Colchicine  well-tolerated. Discussed transitioning to as-needed dosing after a month or two, considering current recovery from knee surgery and potential flare-ups. - Continue colchicine  for another month or two, then consider transitioning to as-needed dosing.  Left hip trochanteric bursitis Pain localized to the greater trochanter area, likely aggravated by recent knee surgery and rehabilitation. Discussed topical treatments and exercises as initial management. Steroid injections considered if symptoms persist, but avoided due to recent knee surgery. - Try topical Voltaren gel or lidocaine  patches for pain relief. - Provided exercises for hip bursitis to improve flexibility and reduce pain. - Will consider steroid injection if symptoms persist after six  weeks.  Peripheral neuropathy of the feet Chronic peripheral neuropathy due to diabetic neuropathy. Emphasized the importance of regular foot checks to prevent unnoticed injuries and potential infections. - Continue regular foot checks to monitor for wounds or infections. - Maintain good foot hygiene and consider using a mirror for better visibility.  Primary osteoarthritis of the hands Intermittent soreness in the hands, likely related to osteoarthritis. No significant swelling or need for steroids at this time. - Continue current pain management with alternating ibuprofen and acetaminophen  PRN  Mild hyperkalemia Discussed recent potassium level slightly elevated at 5.2, likely due to dietary intake and recent dehydration from diarrhea. Discussed potential risks of high potassium levels, including arrhythmias, and the importance of hydration. - Provided information on potassium-rich foods to monitor dietary intake. - Encouraged adequate hydration to help regulate potassium levels.        Orders: No orders of the defined types were placed in this encounter.  Meds ordered this encounter  Medications   colchicine  0.6 MG tablet    Sig: Take 1 tablet (0.6 mg total) by mouth daily as needed.    Dispense:  90 tablet    Refill:  1   I personally spent a total of 30 minutes in the care of the patient today including preparing to see the patient, getting/reviewing separately obtained history, performing a medically appropriate exam/evaluation, counseling and educating, placing orders, documenting clinical information in the EHR, communicating results, and coordinating care.   Follow-Up Instructions: Return in about 6 months (around 01/06/2025) for CPPD/OA f/u 6 mos.   Lonni LELON Ester, MD  Note - This record has been created using [employer].  Chart creation errors have been sought, but may not always  have been located. Such creation errors do not reflect on  the standard of  medical care.

## 2024-07-09 ENCOUNTER — Ambulatory Visit: Attending: Internal Medicine | Admitting: Internal Medicine

## 2024-07-09 ENCOUNTER — Encounter: Payer: Self-pay | Admitting: Internal Medicine

## 2024-07-09 VITALS — BP 114/72 | HR 90 | Temp 98.1°F | Resp 16 | Ht 64.0 in | Wt 176.2 lb

## 2024-07-09 DIAGNOSIS — Z794 Long term (current) use of insulin: Secondary | ICD-10-CM

## 2024-07-09 DIAGNOSIS — M130 Polyarthritis, unspecified: Secondary | ICD-10-CM

## 2024-07-09 DIAGNOSIS — E1142 Type 2 diabetes mellitus with diabetic polyneuropathy: Secondary | ICD-10-CM

## 2024-07-09 DIAGNOSIS — M7062 Trochanteric bursitis, left hip: Secondary | ICD-10-CM | POA: Diagnosis not present

## 2024-07-09 DIAGNOSIS — M1120 Other chondrocalcinosis, unspecified site: Secondary | ICD-10-CM | POA: Diagnosis not present

## 2024-07-09 MED ORDER — COLCHICINE 0.6 MG PO TABS: 0.6000 mg | ORAL_TABLET | Freq: Every day | ORAL | 1 refills | Status: AC | PRN

## 2024-07-10 ENCOUNTER — Other Ambulatory Visit: Payer: Self-pay

## 2024-07-10 ENCOUNTER — Emergency Department (HOSPITAL_COMMUNITY)

## 2024-07-10 ENCOUNTER — Emergency Department (HOSPITAL_COMMUNITY)
Admission: EM | Admit: 2024-07-10 | Discharge: 2024-07-10 | Disposition: A | Discharge status: Home / Self Care | Attending: Emergency Medicine | Admitting: Emergency Medicine

## 2024-07-10 DIAGNOSIS — Z79899 Other long term (current) drug therapy: Secondary | ICD-10-CM | POA: Insufficient documentation

## 2024-07-10 DIAGNOSIS — M25562 Pain in left knee: Secondary | ICD-10-CM | POA: Insufficient documentation

## 2024-07-10 DIAGNOSIS — Z7984 Long term (current) use of oral hypoglycemic drugs: Secondary | ICD-10-CM | POA: Insufficient documentation

## 2024-07-10 DIAGNOSIS — M25521 Pain in right elbow: Secondary | ICD-10-CM | POA: Insufficient documentation

## 2024-07-10 DIAGNOSIS — I2510 Atherosclerotic heart disease of native coronary artery without angina pectoris: Secondary | ICD-10-CM | POA: Insufficient documentation

## 2024-07-10 DIAGNOSIS — Y990 Civilian activity done for income or pay: Secondary | ICD-10-CM | POA: Diagnosis not present

## 2024-07-10 DIAGNOSIS — W01198A Fall on same level from slipping, tripping and stumbling with subsequent striking against other object, initial encounter: Secondary | ICD-10-CM | POA: Diagnosis not present

## 2024-07-10 DIAGNOSIS — M546 Pain in thoracic spine: Secondary | ICD-10-CM | POA: Diagnosis not present

## 2024-07-10 DIAGNOSIS — I700 Atherosclerosis of aorta: Secondary | ICD-10-CM | POA: Insufficient documentation

## 2024-07-10 DIAGNOSIS — S0990XA Unspecified injury of head, initial encounter: Secondary | ICD-10-CM | POA: Diagnosis present

## 2024-07-10 DIAGNOSIS — W19XXXA Unspecified fall, initial encounter: Secondary | ICD-10-CM

## 2024-07-10 LAB — I-STAT CHEM 8, ED
BUN: 27 mg/dL — ABNORMAL HIGH (ref 8–23)
Calcium, Ion: 1.21 mmol/L (ref 1.15–1.40)
Chloride: 111 mmol/L (ref 98–111)
Creatinine, Ser: 1.2 mg/dL (ref 0.61–1.24)
Glucose, Bld: 91 mg/dL (ref 70–99)
HCT: 35 % — ABNORMAL LOW (ref 39.0–52.0)
Hemoglobin: 11.9 g/dL — ABNORMAL LOW (ref 13.0–17.0)
Potassium: 4.7 mmol/L (ref 3.5–5.1)
Sodium: 143 mmol/L (ref 135–145)
TCO2: 17 mmol/L — ABNORMAL LOW (ref 22–32)

## 2024-07-10 MED ORDER — HYDROMORPHONE HCL 1 MG/ML IJ SOLN
0.5000 mg | Freq: Once | INTRAMUSCULAR | Status: AC
  Administered 2024-07-10: 0.5 mg via INTRAVENOUS
  Filled 2024-07-10 (×2): qty 0.5

## 2024-07-10 MED ORDER — ONDANSETRON HCL 4 MG/2ML IJ SOLN
4.0000 mg | Freq: Once | INTRAMUSCULAR | Status: AC
  Administered 2024-07-10: 4 mg via INTRAVENOUS
  Filled 2024-07-10: qty 2

## 2024-07-10 MED ORDER — TETANUS-DIPHTH-ACELL PERTUSSIS 5-2-15.5 LF-MCG/0.5 IM SUSP: 0.5000 mL | Freq: Once | INTRAMUSCULAR | Status: DC

## 2024-07-10 MED ORDER — MORPHINE SULFATE (PF) 2 MG/ML IV SOLN
2.0000 mg | Freq: Once | INTRAVENOUS | Status: AC
  Administered 2024-07-10: 2 mg via INTRAVENOUS
  Filled 2024-07-10: qty 1

## 2024-07-10 MED ORDER — SODIUM CHLORIDE 0.9 % IV BOLUS
1000.0000 mL | Freq: Once | INTRAVENOUS | Status: AC
Start: 2024-07-10 — End: 2024-07-10
  Administered 2024-07-10: 1000 mL via INTRAVENOUS

## 2024-07-10 NOTE — Discharge Instructions (Signed)
 Take your pain medicine as needed and follow-up with your family doctor this week for recheck

## 2024-07-10 NOTE — ED Triage Notes (Signed)
 [facility] reports pt coming from home. Pt was working in his garage. Pt tripped over something and fell backwards landing on his back and head. Pt c/o head and back pain. EMS found a small skin tear to left elbow. Pt denies any LOC. C-collar in place by EMS.

## 2024-07-13 NOTE — ED Provider Notes (Signed)
 [place] EMERGENCY DEPARTMENT AT [facility] Provider Note   CSN: [id number] Arrival date & time: 07/10/24  1104     Patient presents with: Fall   Travis [name] is a 63 y.o. male.   Patient states he fell and hit his head and had loss of consciousness.  Patient also complaining of left knee pain and right elbow pain with pain in his thoracic spine  The history is provided by the patient and medical records. No language interpreter was used.  Fall This is a new problem. The current episode started 6 to 12 hours ago. The problem occurs rarely. The problem has not changed since onset.Pertinent negatives include no chest pain, no abdominal pain and no headaches. Nothing aggravates the symptoms. Nothing relieves the symptoms. He has tried nothing for the symptoms.       Prior to Admission medications   Medication Sig Start Date End Date Taking? Authorizing Provider  atenolol (TENORMIN) 25 MG tablet Take 25 mg by mouth daily.    [provider]  atorvastatin  (LIPITOR) 20 MG tablet Take 1 tablet (20 mg total) by mouth daily. For high cholesterol 10/15/18   Collene Gouge I, NP  carbamazepine  (TEGRETOL ) 200 MG tablet Take 200 mg by mouth 2 (two) times daily. 09/28/20   [provider]  cetirizine (ZYRTEC) 10 MG tablet Take 1 tablet by mouth daily.    [provider]  clobetasol ointment (TEMOVATE) 0.05 % Apply topically 2 (two) times daily. 11/19/23   [provider]  colchicine  0.6 MG tablet Take 1 tablet (0.6 mg total) by mouth daily as needed. 07/09/24   Rice, Lonni ORN, MD  Continuous Glucose Sensor (FREESTYLE LIBRE 3 PLUS SENSOR) MISC 1 Device by Other route every 14 (fourteen) days. Change sensor every 15 days. 04/16/24   Shamleffer, Ibtehal Jaralla, MD  cyclobenzaprine  (FLEXERIL ) 5 MG tablet Take 5 mg by mouth. 03/17/24   [provider]  docusate sodium  (COLACE) 100 MG capsule Take by mouth 2 (two) times daily. 03/19/24    [provider]  empagliflozin  (JARDIANCE ) 25 MG TABS tablet Half a tablet daily Patient taking differently: 12.5 mg. Half a tablet daily 04/16/24   Shamleffer, Ibtehal Jaralla, MD  FEROSUL 325 (65 Fe) MG tablet Take 325 mg by mouth 2 (two) times daily with a meal. 07/22/23   [provider]  FLUoxetine  (PROZAC ) 20 MG capsule Take 60 mg by mouth every morning. 10/05/20   [provider]  FLUoxetine  (PROZAC ) 40 MG capsule TAKE ONE CAPSULE BY MOUTH DAILY along with THE 20 MG FOR a total of 60 MG    [provider]  furosemide  (LASIX ) 20 MG tablet Take 20 mg by mouth as needed. 10/12/20   [provider]  gemfibrozil (LOPID) 600 MG tablet Take 600 mg by mouth 2 (two) times daily.    [provider]  glipiZIDE  (GLUCOTROL ) 5 MG tablet Take 1 tablet (5 mg total) by mouth 2 (two) times daily before a meal. 04/16/24   Shamleffer, Ibtehal Jaralla, MD  hydrochlorothiazide (HYDRODIURIL) 25 MG tablet Take 25 mg by mouth every evening. 10/16/21   [provider]  hydrocortisone  (ANUSOL -HC) 25 MG suppository Place 1 suppository (25 mg total) rectally 2 (two) times daily. Patient taking differently: Place 25 mg rectally as needed. 11/12/23   Ahmed, Deatrice FALCON, MD  hydrOXYzine  (ATARAX ) 25 MG tablet TAKE 1 TABLET BY MOUTH DAILY AS NEEDED FOR ANXIETY OR SLEEP Patient taking differently: as needed.  [provider]  KLOR-CON  M20 20 MEQ tablet Take 20 mEq by mouth as needed. 09/28/20   [provider]  linaclotide LARUE) 145 MCG CAPS capsule Take 1 capsule (145 mcg total) by mouth daily before breakfast. 06/15/24 12/12/24  Ahmed, Deatrice FALCON, MD  Magnesium  Oxide -Mg Supplement 400 MG CAPS Take 400 mg by mouth.    [provider]  meclizine (ANTIVERT) 25 MG tablet TAKE 1 TABLET BY MOUTH FOUR TIMES DAILY AS NEEDED FOR dizziness Patient taking differently: as needed.    [provider]  metFORMIN  (GLUCOPHAGE ) 1000 MG tablet  Take 1 tablet (1,000 mg total) by mouth 2 (two) times daily with a meal. 04/16/24   Shamleffer, Ibtehal Jaralla, MD  Naloxone HCl 3 MG/0.1ML LIQD as needed. 03/19/24   [provider]  nitroGLYCERIN  (NITROSTAT ) 0.4 MG SL tablet Place 1 tablet (0.4 mg total) under the tongue every 5 (five) minutes as needed for chest pain. 10/15/18   Collene Gouge I, NP  ondansetron  (ZOFRAN ) 4 MG tablet Take 4-8 mg by mouth as needed.    [provider]  oxyCODONE  (OXY IR/ROXICODONE ) 5 MG immediate release tablet Take 5 mg by mouth. Patient taking differently: Take 5 mg by mouth as needed. 06/10/24   [provider]  pantoprazole  (PROTONIX ) 40 MG tablet Take 1 tablet (40 mg total) by mouth 2 (two) times daily. 06/15/24 09/13/24  Ahmed, Muhammad F, MD  polyethylene glycol (MIRALAX  / GLYCOLAX ) 17 g packet Take 17 g by mouth as needed.    [provider]  pramipexole  (MIRAPEX ) 1.5 MG tablet Take 1.5 mg by mouth at bedtime.    [provider]  tamsulosin  (FLOMAX ) 0.4 MG CAPS capsule Take 1 capsule (0.4 mg total) by mouth daily after breakfast. For prostate health 10/15/18   Collene Gouge I, NP  traZODone  (DESYREL ) 100 MG tablet Take 1 tablet (100 mg total) by mouth at bedtime. For sleep 10/15/18   Collene Gouge I, NP  Vitamin D, Ergocalciferol, (DRISDOL) 1.25 MG (50000 UNIT) CAPS capsule Take 50,000 Units by mouth once a week. 07/14/23   [provider]    Allergies: Doxycycline, Gabapentin, Methocarbamol, Mounjaro  [tirzepatide ], Lisinopril, Lyrica [pregabalin], and Promethazine     Review of Systems  Constitutional:  Negative for appetite change and fatigue.  HENT:  Negative for congestion, ear discharge and sinus pressure.   Eyes:  Negative for discharge.  Respiratory:  Negative for cough.   Cardiovascular:  Negative for chest pain.  Gastrointestinal:  Negative for abdominal pain and diarrhea.  Genitourinary:  Negative for frequency and hematuria.  Musculoskeletal:   Negative for back pain.       Upper back pain, left knee pain, right elbow pain.  Patient also complains of a headache  Skin:  Negative for rash.  Neurological:  Negative for seizures and headaches.  Psychiatric/Behavioral:  Negative for hallucinations.     Updated Vital Signs BP 113/75   Pulse (!) 102   Temp 97.9 F (36.6 C) (Oral)   Resp 18   Ht 5' 5 (1.651 m)   Wt 79.4 kg   SpO2 95%   BMI 29.12 kg/m   Physical Exam Vitals and nursing note reviewed.  Constitutional:      Appearance: He is well-developed.  HENT:     Head: Normocephalic.     Right Ear: Tympanic membrane normal.     Left Ear: Tympanic membrane normal.     Nose: Nose normal.  Eyes:     General: No scleral  icterus.    Conjunctiva/sclera: Conjunctivae normal.  Neck:     Thyroid: No thyromegaly.  Cardiovascular:     Rate and Rhythm: Normal rate and regular rhythm.     Heart sounds: No murmur heard.    No friction rub. No gallop.  Pulmonary:     Breath sounds: No stridor. No wheezing or rales.  Chest:     Chest wall: No tenderness.  Abdominal:     General: There is no distension.     Tenderness: There is no abdominal tenderness. There is no rebound.  Musculoskeletal:        General: Normal range of motion.     Cervical back: Neck supple.     Comments: Tenderness to thoracic spine, right elbow, left knee, occipital head  Lymphadenopathy:     Cervical: No cervical adenopathy.  Skin:    Findings: No erythema or rash.  Neurological:     Mental Status: He is alert and oriented to person, place, and time.     Motor: No abnormal muscle tone.     Coordination: Coordination normal.  Psychiatric:        Behavior: Behavior normal.     (all labs ordered are listed, but only abnormal results are displayed) Labs Reviewed  I-STAT CHEM 8, ED - Abnormal; Notable for the following components:      Result Value   BUN 27 (*)    TCO2 17 (*)    Hemoglobin 11.9 (*)    HCT 35.0 (*)    All other components  within normal limits    EKG: None  Radiology: No results found.   Procedures   Medications Ordered in the ED  morphine  (PF) 2 MG/ML injection 2 mg (2 mg Intravenous Given 07/10/24 1257)  HYDROmorphone  (DILAUDID ) injection 0.5 mg (0.5 mg Intravenous Given 07/10/24 1407)  ondansetron  (ZOFRAN ) injection 4 mg (4 mg Intravenous Given 07/10/24 1256)  sodium chloride  0.9 % bolus 1,000 mL (0 mLs Intravenous Stopped 07/10/24 1359)  ondansetron  (ZOFRAN ) injection 4 mg (4 mg Intravenous Given 07/10/24 1403)                                    Medical Decision Making Amount and/or Complexity of Data Reviewed Radiology: ordered.  Risk Prescription drug management.   Patient with fall and multiple contusions.  He will take Tylenol  and follow-up with his PCP     Final diagnoses:  Fall, initial encounter    ED Discharge Orders     None          Suzette Pac, MD 07/13/24 1019

## 2024-07-16 ENCOUNTER — Ambulatory Visit: Payer: Self-pay

## 2024-07-16 ENCOUNTER — Institutional Professional Consult (permissible substitution): Payer: BC Managed Care – PPO | Admitting: Psychology

## 2024-07-18 ENCOUNTER — Other Ambulatory Visit: Payer: Self-pay | Admitting: Internal Medicine

## 2024-07-27 ENCOUNTER — Encounter: Payer: BC Managed Care – PPO | Admitting: Psychology

## 2024-08-31 ENCOUNTER — Telehealth (INDEPENDENT_AMBULATORY_CARE_PROVIDER_SITE_OTHER): Payer: Self-pay | Admitting: Gastroenterology

## 2024-08-31 NOTE — Telephone Encounter (Signed)
 Attempted PA for Linzess  145 mcg. When sending PA, a message popped up stating : Warning Our records indicate that this request has already been sent to the patient's insurance plan. To follow up, please call the plan directly.

## 2024-09-30 ENCOUNTER — Other Ambulatory Visit (INDEPENDENT_AMBULATORY_CARE_PROVIDER_SITE_OTHER): Payer: Self-pay | Admitting: Gastroenterology

## 2024-09-30 ENCOUNTER — Telehealth (INDEPENDENT_AMBULATORY_CARE_PROVIDER_SITE_OTHER): Payer: Self-pay | Admitting: Gastroenterology

## 2024-09-30 DIAGNOSIS — K581 Irritable bowel syndrome with constipation: Secondary | ICD-10-CM

## 2024-09-30 MED ORDER — LINACLOTIDE 145 MCG PO CAPS: 145.0000 ug | ORAL_CAPSULE | Freq: Every day | ORAL | 1 refills | Status: AC

## 2024-09-30 NOTE — Telephone Encounter (Signed)
 PA completed for Linzess  145 mcg via Cover My Meds. Await insurance decision

## 2024-09-30 NOTE — Telephone Encounter (Signed)
 PA through Cover My Meds was sent to [facility]. Received fax stating this was the incorrect PA center. Contacted [facility] and they will be faxing over a new PA and will also be in Cover My Meds.

## 2024-09-30 NOTE — Telephone Encounter (Signed)
 Refill request from [facility] for Linzes 145 mcg. Refill sent to [facility]

## 2024-10-01 NOTE — Telephone Encounter (Signed)
 Received new PA from from pharmacy. Contacted wife Newell to see what insurance they have; they still have [facility]. PA form still using [facility] and per Cover My Meds that is not the correct plan. Contacted number on insurance card for [employer] but received a message that stated they are no longer the coverage plan for [employer] and then hung up. Called pharmacy to see if they had any information that would help me with PA. They gave me a new BIN and PCN number. Plugged that into Cover My Meds and was able fill out PA using [facility]. Await to see if that is the correct plan/determination.   BIN [id number] GRP [id number] PCN [id number]

## 2024-10-29 ENCOUNTER — Ambulatory Visit: Admitting: Internal Medicine

## 2025-01-07 ENCOUNTER — Ambulatory Visit: Admitting: Internal Medicine
